# Patient Record
Sex: Male | Born: 1955
Health system: Southern US, Community
[De-identification: ages and names within clinical notes are randomized; demographics above are authoritative.]

## PROBLEM LIST (undated history)

## (undated) DIAGNOSIS — I2119 ST elevation (STEMI) myocardial infarction involving other coronary artery of inferior wall: Secondary | ICD-10-CM

## (undated) DIAGNOSIS — G4730 Sleep apnea, unspecified: Secondary | ICD-10-CM

## (undated) DIAGNOSIS — K219 Gastro-esophageal reflux disease without esophagitis: Secondary | ICD-10-CM

## (undated) DIAGNOSIS — E7800 Pure hypercholesterolemia, unspecified: Secondary | ICD-10-CM

## (undated) DIAGNOSIS — I2510 Atherosclerotic heart disease of native coronary artery without angina pectoris: Secondary | ICD-10-CM

## (undated) DIAGNOSIS — I10 Essential (primary) hypertension: Secondary | ICD-10-CM

## (undated) DIAGNOSIS — D649 Anemia, unspecified: Secondary | ICD-10-CM

## (undated) DIAGNOSIS — J302 Other seasonal allergic rhinitis: Secondary | ICD-10-CM

## (undated) DIAGNOSIS — M1990 Unspecified osteoarthritis, unspecified site: Secondary | ICD-10-CM

## (undated) DIAGNOSIS — T7840XA Allergy, unspecified, initial encounter: Secondary | ICD-10-CM

## (undated) HISTORY — DX: Atherosclerotic heart disease of native coronary artery without angina pectoris: I25.10

## (undated) HISTORY — PX: FRACTURE SURGERY: SHX138

## (undated) HISTORY — DX: Sleep apnea, unspecified: G47.30

## (undated) HISTORY — PX: APPENDECTOMY: SHX54

## (undated) HISTORY — PX: CHOLECYSTECTOMY OPEN: SUR202

## (undated) HISTORY — DX: Allergy, unspecified, initial encounter: T78.40XA

## (undated) HISTORY — PX: HERNIA REPAIR: SHX51

---

## 2002-04-05 ENCOUNTER — Encounter: Payer: Self-pay | Admitting: Family Medicine

## 2002-04-05 ENCOUNTER — Encounter: Admission: RE | Admit: 2002-04-05 | Discharge: 2002-04-05 | Payer: Self-pay | Admitting: Family Medicine

## 2006-01-13 ENCOUNTER — Emergency Department (HOSPITAL_COMMUNITY): Admission: EM | Admit: 2006-01-13 | Discharge: 2006-01-14 | Payer: Self-pay | Admitting: Emergency Medicine

## 2007-03-24 DIAGNOSIS — I2119 ST elevation (STEMI) myocardial infarction involving other coronary artery of inferior wall: Secondary | ICD-10-CM

## 2007-03-24 HISTORY — PX: CORONARY ANGIOPLASTY WITH STENT PLACEMENT: SHX49

## 2007-03-24 HISTORY — DX: ST elevation (STEMI) myocardial infarction involving other coronary artery of inferior wall: I21.19

## 2007-08-31 ENCOUNTER — Ambulatory Visit: Payer: Self-pay | Admitting: Cardiovascular Disease

## 2007-08-31 ENCOUNTER — Encounter: Payer: Self-pay | Admitting: Cardiovascular Disease

## 2007-08-31 ENCOUNTER — Inpatient Hospital Stay (HOSPITAL_COMMUNITY): Admission: EM | Admit: 2007-08-31 | Discharge: 2007-09-02 | Payer: Self-pay | Admitting: Emergency Medicine

## 2008-04-08 ENCOUNTER — Emergency Department (HOSPITAL_COMMUNITY): Admission: EM | Admit: 2008-04-08 | Discharge: 2008-04-09 | Payer: Self-pay | Admitting: Emergency Medicine

## 2009-01-02 ENCOUNTER — Encounter: Payer: Self-pay | Admitting: Pulmonary Disease

## 2009-01-02 ENCOUNTER — Ambulatory Visit (HOSPITAL_BASED_OUTPATIENT_CLINIC_OR_DEPARTMENT_OTHER): Admission: RE | Admit: 2009-01-02 | Discharge: 2009-01-02 | Payer: Self-pay | Admitting: Cardiovascular Disease

## 2009-01-12 ENCOUNTER — Ambulatory Visit: Payer: Self-pay | Admitting: Internal Medicine

## 2009-11-26 ENCOUNTER — Ambulatory Visit: Payer: Self-pay | Admitting: Cardiovascular Disease

## 2010-01-16 ENCOUNTER — Ambulatory Visit: Payer: Self-pay | Admitting: Cardiovascular Disease

## 2010-01-22 ENCOUNTER — Telehealth (INDEPENDENT_AMBULATORY_CARE_PROVIDER_SITE_OTHER): Payer: Self-pay | Admitting: Radiology

## 2010-01-23 ENCOUNTER — Encounter: Payer: Self-pay | Admitting: *Deleted

## 2010-01-23 ENCOUNTER — Ambulatory Visit: Payer: Self-pay | Admitting: Cardiology

## 2010-01-23 ENCOUNTER — Encounter (HOSPITAL_COMMUNITY)
Admission: RE | Admit: 2010-01-23 | Discharge: 2010-03-22 | Payer: Self-pay | Source: Home / Self Care | Attending: Cardiovascular Disease | Admitting: Cardiovascular Disease

## 2010-01-23 ENCOUNTER — Ambulatory Visit: Payer: Self-pay

## 2010-02-19 DIAGNOSIS — E785 Hyperlipidemia, unspecified: Secondary | ICD-10-CM | POA: Insufficient documentation

## 2010-02-19 DIAGNOSIS — G4733 Obstructive sleep apnea (adult) (pediatric): Secondary | ICD-10-CM | POA: Insufficient documentation

## 2010-02-19 DIAGNOSIS — I2510 Atherosclerotic heart disease of native coronary artery without angina pectoris: Secondary | ICD-10-CM | POA: Insufficient documentation

## 2010-02-20 ENCOUNTER — Ambulatory Visit: Payer: Self-pay | Admitting: Pulmonary Disease

## 2010-02-20 DIAGNOSIS — G2581 Restless legs syndrome: Secondary | ICD-10-CM | POA: Insufficient documentation

## 2010-03-24 ENCOUNTER — Inpatient Hospital Stay (HOSPITAL_COMMUNITY)
Admission: EM | Admit: 2010-03-24 | Discharge: 2010-03-25 | Payer: Self-pay | Source: Home / Self Care | Attending: Cardiovascular Disease | Admitting: Cardiovascular Disease

## 2010-03-26 ENCOUNTER — Telehealth (INDEPENDENT_AMBULATORY_CARE_PROVIDER_SITE_OTHER): Payer: Self-pay | Admitting: *Deleted

## 2010-04-13 ENCOUNTER — Encounter: Payer: Self-pay | Admitting: Emergency Medicine

## 2010-04-15 NOTE — Discharge Summary (Signed)
NAMELOGHAN, KURTZMAN               ACCOUNT NO.:  [id number]  MEDICAL RECORD NO.:  [id number]          PATIENT TYPE:  INP  LOCATION:  2002                         FACILITY:  [facility]  PHYSICIAN:  Cassell Clement, M.D. DATE OF BIRTH:  [birth year]  DATE OF ADMISSION:  03/24/2010 DATE OF DISCHARGE:  03/25/2010                              DISCHARGE SUMMARY   PRIMARY CARDIOLOGIST:  Vesta Mixer, MD  DISCHARGE DIAGNOSES: 1. Noncardiac left upper extremity discomfort.     a.     Treadmill stress Myoview March 25, 2010:  Excellent      exercise tolerance, no ischemic EKG changes, inferior wall infarct      at mid and basal levels with no ischemia, ejection fraction 65%. 2. Dyskinesia.     a.     Etiology unknown, top of differential diagnoses, recent      initiation on ropinirole for restless legs syndrome.  SECONDARY DIAGNOSES: 1. Coronary artery disease.     a.     Status post acute inferior wall myocardial infarction with      subsequent percutaneous coronary intervention with 2.75 x 28-mm      Promus and 3.0 x 15-mm Promus to proximal and mid right coronary      artery. 2. Restless legs syndrome. 3. Hypertension. 4. Dyslipidemia. 5. Obstructive sleep apnea. 6. Status post cholecystectomy. 7. Remote history of tobacco abuse (quit 2009).  ALLERGIES:  NKDA.  PROCEDURES: 1. EKG March 24, 2009:  Sinus rhythm, Q-wave in lead III, no other     significant ST-T-wave changes, 79 bpm. 2. Chest x-ray March 25, 2010:  No acute cardiopulmonary process     seen. 3. Treadmill Myoview March 25, 2010:  Please see discharge diagnoses     section #1 subsection A.  HISTORY OF PRESENT ILLNESS:  Mr. Lance is a 55 year old Caucasian gentleman with the above-noted complex medical history who was in his usual state of health until approximately 8 p.m. on March 24, 2010, when he took a pill with [facility] for his restless legs syndrome and then begin shaking severely.  He said he got  very cold and could not warm up.  He took shower because he thought his shaking could potentially be related to lower body temperature, but did not have significant improvement.  He then developed severe left upper extremity discomfort.  He said it was worse than symptoms he has had in the past. He denies any chest discomfort.  He subsequently came to the emergency department for further evaluation.  When seen by Cardiology, he was asymptomatic.  Of note, his prior angina is somewhat different than his current presentation including only minimal shakiness in the past as well as chest discomfort along with his left upper extremity pain.  HOSPITAL COURSE:  The patient was admitted and cardiac enzymes were cycled x3.  All of these were negative.  Due to his history of coronary artery disease and somewhat similar presentation to prior angina, he underwent a treadmill Myoview on the morning of March 25, 2010.  His exercise tolerance is noticed as being excellent.  The cardiologist who read his images  noted no concerning ST-T-wave changes on his EKG and inferior scar, but no ischemia as well as normal left ventricular ejection fraction.  The patient had no recurrent shakiness or left upper extremity during his hospital stay.  He was deemed stable for discharge, pending negative results of his Myoview by attending cardiologist, Dr. Cassell Clement who rounded on him on the morning of March 25, 2010. He has been instructed to not continue his ropinirole until discussing with the physician who prescribed it.  He will be set up for a followup appointment with his primary cardiologist in the next 2-4 weeks.  At the time of his discharge on the early evening of March 25, 2010, the patient was given his new medication list, new prescription for sublingual nitroglycerin (did not appear on his old med list), followup instructions, and all questions and concerns were addressed prior to his leaving  the hospital.  DISCHARGE LABORATORY DATA:  WBC 8.4, HGB 13.9, HCT 41.0, PLT count is 263, WBC differential was within normal limits.  Protime 12.2, INR 0.89. Sodium 139, potassium 4.1, chloride 103, BUN is 9, creatinine 1.2, glucose 111.  Point-of-care marker is negative and 3 full sets of enzymes negative.  Total cholesterol 163, triglycerides 227, HDL 31, LDL 87, total cholesterol/HDL ratio 5.3.  FOLLOWUP PLANS AND APPOINTMENTS:  Please see hospital course.  DISCHARGE MEDICATIONS: 1. Sublingual nitroglycerin 0.4 mg q.5 minutes up to 3 doses p.r.n.     for chest discomfort. 2. Enteric-coated aspirin 81 mg p.o. q.a.m. 3. Metoprolol tartrate 25 mg 1 tablet p.o. b.i.d. 4. Nexium 40 mg 1 capsule p.o. daily. 5. Plavix 75 mg 1 tablet p.o. daily. 6. Simvastatin 40 mg 1 tablet p.o. nightly.  DURATION OF DISCHARGE ENCOUNTER INCLUDING PHYSICIAN TIME:  Thirty five minutes.     Jarrett Ables, PAC   ______________________________ Cassell Clement, M.D.    MS/MEDQ  D:  03/25/2010  T:  03/26/2010  Job:  [id number]  cc:   Vesta Mixer, M.D.  Electronically Signed by Jarrett Ables PAC on 04/02/2010 10:54:44 AM Electronically Signed by Cassell Clement M.D. on 04/15/2010 11:51:03 AM

## 2010-04-22 NOTE — Progress Notes (Signed)
Summary: nuc pre-procedure  Phone Note Outgoing Call   Call placed by: Domenic Polite, CNMT,  January 22, 2010 3:53 PM Call placed to: Patient Reason for Call: Confirm/change Appt Summary of Call: Reviewed information on Myoview Information Sheet (see scanned document for further details).  Spoke with patient.  Initial call taken by: Domenic Polite, CNMT,  January 22, 2010 3:53 PM     Nuclear Med Background Indications for Stress Test: Evaluation for Ischemia, Stent Patency   History: Heart Catheterization, Myocardial Infarction, Myocardial Perfusion Study, Stents  History Comments: '09 MI / Cath /Stent RCA , 12/17/08 MPI Inf scar/ no isch. / EF =70% , OSA  Symptoms: Chest Pain  Symptoms Comments: Burning with lt arm pain   Nuclear Pre-Procedure Cardiac Risk Factors: Lipids Tech Comments: I - RBBB

## 2010-04-22 NOTE — Assessment & Plan Note (Signed)
Summary: Cardiology Nuclear Testing  Nuclear Med Background Indications for Stress Test: Evaluation for Ischemia, Stent Patency   History: Heart Catheterization, Myocardial Infarction, Myocardial Perfusion Study, Stents  History Comments: '09 MI>Stent-RCA; 9/10 [id number] scar, no ischemia, EF=70%  Symptoms: Chest Tightness, Palpitations, Rapid HR  Symptoms Comments: Last episode of CP:01/16/10.   Nuclear Pre-Procedure Cardiac Risk Factors: Family History - CAD, History of Smoking, Lipids, RBBB Caffeine/Decaff Intake: None NPO After: 7:00 PM Lungs: Clear IV 0.9% NS with Angio Cath: 20g     IV Site: R Antecubital IV Started by: Irean Hong, RN Chest Size (in) 42     Height (in): 69 Weight (lb): 200 BMI: 29.64 Tech Comments: Held metoprolol x 24 hours.  Nuclear Med Study 1 or 2 day study:  1 day     Stress Test Type:  Stress Reading MD:  Cassell Clement, MD     Referring MD:  Laurice Record, MD Resting Radionuclide:  Technetium 57m Tetrofosmin     Resting Radionuclide Dose:  10.0 mCi  Stress Radionuclide:  Technetium 8m Tetrofosmin     Stress Radionuclide Dose:  33.0 mCi   Stress Protocol Exercise Time (min):  11:30 min     Max HR:  148 bpm     Predicted Max HR:  166 bpm  Max Systolic BP: 200 mm Hg     Percent Max HR:  89.16 %     METS: 13.7 Rate Pressure Product:  98119    Stress Test Technologist:  [employer], CMA-N     Nuclear Technologist:  Domenic Polite, CNMT  Rest Procedure  Myocardial perfusion imaging was performed at rest 45 minutes following the intravenous administration of Technetium 48m Tetrofosmin.  Stress Procedure  The patient exercised for 11:30.  The patient stopped due to fatigue and denied any chest pain.  There were no significant ST-T wave changes, only occasional PVC's.  He did have a mild hypertensive response to exercise, 200/93.  Technetium 69m Tetrofosmin was injected at peak exercise and myocardial perfusion imaging was performed after a  brief delay.  QPS Raw Data Images:  Mild motion artefact Stress Images:  Normal homogeneous uptake in all areas of the myocardium. Rest Images:  Normal homogeneous uptake in all areas of the myocardium. Subtraction (SDS):  No evidence of ischemia. Transient Ischemic Dilatation:  1.23  (Normal <1.22)  Lung/Heart Ratio:  .37  (Normal <0.45)  Quantitative Gated Spect Images QGS EDV:  93 ml QGS ESV:  36 ml QGS EF:  61 % QGS cine images:  Normal LV systolic function.  Findings Normal nuclear study      Overall Impression  Exercise Capacity: Excellent exercise capacity. BP Response: Mild hypertensive blood pressure response. Clinical Symptoms: No chest pain ECG Impression: No significant ST segment change suggestive of ischemia.  Occasional PVCs Overall Impression: Normal stress nuclear study.  Appended Document: Cardiology Nuclear Testing copy sent to Dr. Melburn Popper

## 2010-04-24 NOTE — Assessment & Plan Note (Signed)
Summary: consult for osa, RLS   Visit Type:  Initial Consult Copy to:  Nahser Primary Talise Sligh/Referring Makenleigh Crownover:  none per pt  CC:  Sleep consult. pt states he wakes up in the middle of night and pt states his wife says he snores at night.  History of Present Illness: The pt is a 54y/o male who I have been asked to see for management of osa.  He was diagnosed last year with moderate obstructive and central apnea, with AHI of 20/hr.  He was also found to have large numbers of PLMS with 4/hr resulting in arousal or awakening.  He comes in today where he has continued to have loud snoring, but no one has mentioned an abnormal breathing pattern during sleep.  He goes to bed at 10pm, and arises at 5:30 am to start his day.  He is rested some mornings, but others not.  He does note an abnormal sensation in his legs at night that is made better with movement, and does have kicking according to his wife.  This has been an issue for him since his teenage years.  He denies issues with sleepiness while at work, but is very active.  He will doze at home with movies or tv.  He notes only rare sleepiness with driving.  His epworth score today is abnormal at 13, and he tells me his weight is up about 20 pounds over the last 2 years.  Preventive Screening-Counseling & Management  Alcohol-Tobacco     Smoking Status: quit  Current Medications (verified): 1)  Aspirin 81 Mg Tabs (Aspirin) .... Once Daily 2)  Plavix 75 Mg Tabs (Clopidogrel Bisulfate) .... Once Daily 3)  Metoprolol Tartrate 25 Mg Tabs (Metoprolol Tartrate) .... 1/2 Two Times A Day 4)  Simvastatin 40 Mg Tabs (Simvastatin) .... Once Daily 5)  Nexium 20 Mg Cpdr (Esomeprazole Magnesium) .... Once Daily As Needed 6)  Fish Oil 1200 Mg Caps (Omega-3 Fatty Acids) .... Once Daily  Allergies: 1)  ! Adhesive Tape 2)  Ace Inhibitors  Past History:  Past Medical History:  Hx of MYOCARDIAL INFARCTION (ICD-410.90) HYPERLIPIDEMIA (ICD-272.4) CAD  (ICD-414.00)--s/p PCI OBSTRUCTIVE SLEEP APNEA (ICD-327.23)--AHI 20/hr in 2010     Past Surgical History: stenting of right coronary artery Cholecystectomy  Family History: Reviewed history and no changes required. heart disease--mother prostate cancer--brother  Social History: Reviewed history and no changes required. married Patient states former smoker. quit 08/2007. 2 1/2 ppd. started age 58 occupation: Production designer, theatre/television/film at [employer] Status:  quit  Review of Systems       The patient complains of chest pain, headaches, and nasal congestion/difficulty breathing through nose.  The patient denies shortness of breath with activity, shortness of breath at rest, productive cough, non-productive cough, coughing up blood, irregular heartbeats, acid heartburn, indigestion, loss of appetite, weight change, abdominal pain, difficulty swallowing, sore throat, tooth/dental problems, sneezing, itching, ear ache, anxiety, depression, hand/feet swelling, joint stiffness or pain, rash, change in color of mucus, and fever.    Vital Signs:  Patient profile:   55 year old male Height:      69 inches Weight:      206.50 pounds BMI:     30.60 O2 Sat:      97 % on Room air Temp:     97.6 degrees F oral Pulse rate:   74 / minute BP sitting:   126 / 80  (right arm) Cuff size:   regular  Vitals Entered By: Carver Fila (February 20, 2010 2:13 PM)  O2 Flow:  Room air CC: Sleep consult. pt states he wakes up in the middle of night, pt states his wife says he snores at night Comments meds and allergies up dated Phone number updated Carver Fila  February 20, 2010 2:13 PM    Physical Exam  General:  ow male in nad  Eyes:  PERRLA and EOMI.   Nose:  deviated septum to left with significant narrowing Mouth:  mild elongation of soft palate, uvula normal Neck:  no jvd, tmg, LN Lungs:  clear to auscultation Heart:  rrr, 1/6 sem Abdomen:  soft and nontender, bs+ Extremities:  no  significant edema, no cyanosis pulses intact distally. Neurologic:  alert and oriented, moves all 4.   Impression & Recommendations:  Problem # 1:  OBSTRUCTIVE SLEEP APNEA (ICD-327.23) the pt does have moderate osa by his sleep study last year, but he is not sure this is significantly impacting his QOL enough to try cpap while working on weight loss.  I have had a long discussion with the pt about sleep apnea, and also discussed its impact on his CV health.  At this point, he would like to try weight loss alone, but is willing to go on cpap if he is not making progress in 6mos.    Problem # 2:  RESTLESS LEGS SYNDROME (ICD-333.94) the pt had large numbers of leg jerks on his sleep study, and his hx is very suggestive of RLS.  Will try him on a dopamine agonist, and see how he responds.  Medications Added to Medication List This Visit: 1)  Aspirin 81 Mg Tabs (Aspirin) .... Once daily 2)  Plavix 75 Mg Tabs (Clopidogrel bisulfate) .... Once daily 3)  Metoprolol Tartrate 25 Mg Tabs (Metoprolol tartrate) .... 1/2 two times a day 4)  Simvastatin 40 Mg Tabs (Simvastatin) .... Once daily 5)  Nexium 20 Mg Cpdr (Esomeprazole magnesium) .... Once daily as needed 6)  Fish Oil 1200 Mg Caps (Omega-3 fatty acids) .... Once daily 7)  Requip 0.5 Mg Tabs (Ropinirole hcl) .... As directed after dinner.  Other Orders: Consultation Level IV (60454)  Patient Instructions: 1)  will treat leg jerks with requip 0.5 mg one each night after dinner.  If not helping, or only partially helping, take 2 after dinner.  Please call me in 3-4 weeks with feedback. 2)  work on weight loss.  Will see you back in 6mos...if no signficant change, will consider cpap. 3)  followup with me in 6mos   Prescriptions: REQUIP 0.5 MG  TABS (ROPINIROLE HCL) as directed after dinner.  #60 x 0   Entered and Authorized by:   Barbaraann Share MD   Signed by:   Barbaraann Share MD on 02/20/2010   Method used:   Print then Give to Patient    RxID:   [id number]

## 2010-04-24 NOTE — Progress Notes (Signed)
Summary: bad reaction to requip---given Horizant samples to try  Phone Note Call from Patient Call back at Home Phone [phone]   Caller: Patient Call For: clacne Reason for Call: Talk to Nurse Summary of Call: Patient stating that he had a bad reaction to requip, he started shaking uncontrollably after taking med, he then went to the ER.  Patient asking for another med. Initial call taken by: Lehman Prom,  March 26, 2010 10:45 AM  Follow-up for Phone Call        Patient states he never took Requip on a regular basis because he had noticed his body would start shaking about 30 minutes after taking each time. Pt says he went to the ER on 03/24/2010 because of uncontrollable shaking. He has stopped using the Requip and wants to know if there is an alternative he can try. Pls advise. Follow-up by: Michel Bickers CMA,  March 26, 2010 12:15 PM  Additional Follow-up for Phone Call Additional follow up Details #1::        there is another med in that same family, but I am hesitant for him to try if he had such a reaction to the requip. can try horizant  600mg  one after dinner each night.  samples left in triage room.  let us know if helps Additional Follow-up by: Barbaraann Share MD,  March 26, 2010 4:58 PM    Additional Follow-up for Phone Call Additional follow up Details #2::    Patient is aware of new medication and was given directions on use. Samples left up front for patient to pick up on Thurs., 03/27/2010.Michel Bickers CMA  March 26, 2010 5:04 PM

## 2010-06-02 LAB — CARDIAC PANEL(CRET KIN+CKTOT+MB+TROPI)
CK, MB: 2.4 ng/mL (ref 0.3–4.0)
CK, MB: 2.7 ng/mL (ref 0.3–4.0)
Relative Index: INVALID (ref 0.0–2.5)
Relative Index: INVALID (ref 0.0–2.5)
Total CK: 55 U/L (ref 7–232)
Total CK: 61 U/L (ref 7–232)
Troponin I: 0.02 ng/mL (ref 0.00–0.06)
Troponin I: 0.03 ng/mL (ref 0.00–0.06)

## 2010-06-02 LAB — CK TOTAL AND CKMB (NOT AT ARMC)
CK, MB: 4.8 ng/mL — ABNORMAL HIGH (ref 0.3–4.0)
Relative Index: INVALID (ref 0.0–2.5)
Total CK: 89 U/L (ref 7–232)

## 2010-06-02 LAB — DIFFERENTIAL
Basophils Absolute: 0 10*3/uL (ref 0.0–0.1)
Basophils Relative: 0 % (ref 0–1)
Eosinophils Absolute: 0.2 10*3/uL (ref 0.0–0.7)
Eosinophils Relative: 2 % (ref 0–5)
Lymphocytes Relative: 26 % (ref 12–46)
Lymphs Abs: 2.2 10*3/uL (ref 0.7–4.0)
Monocytes Absolute: 0.8 10*3/uL (ref 0.1–1.0)
Monocytes Relative: 9 % (ref 3–12)
Neutro Abs: 5.3 10*3/uL (ref 1.7–7.7)
Neutrophils Relative %: 63 % (ref 43–77)

## 2010-06-02 LAB — CBC
HCT: 40.4 % (ref 39.0–52.0)
Hemoglobin: 13.5 g/dL (ref 13.0–17.0)
MCH: 29.3 pg (ref 26.0–34.0)
MCHC: 33.4 g/dL (ref 30.0–36.0)
MCV: 87.6 fL (ref 78.0–100.0)
Platelets: 263 10*3/uL (ref 150–400)
RBC: 4.61 MIL/uL (ref 4.22–5.81)
RDW: 12.3 % (ref 11.5–15.5)
WBC: 8.4 10*3/uL (ref 4.0–10.5)

## 2010-06-02 LAB — TROPONIN I: Troponin I: 0.02 ng/mL (ref 0.00–0.06)

## 2010-06-02 LAB — POCT I-STAT, CHEM 8
BUN: 9 mg/dL (ref 6–23)
Calcium, Ion: 1.15 mmol/L (ref 1.12–1.32)
Chloride: 103 mEq/L (ref 96–112)
Creatinine, Ser: 1.2 mg/dL (ref 0.4–1.5)
Glucose, Bld: 111 mg/dL — ABNORMAL HIGH (ref 70–99)
HCT: 41 % (ref 39.0–52.0)
Hemoglobin: 13.9 g/dL (ref 13.0–17.0)
Potassium: 4.1 mEq/L (ref 3.5–5.1)
Sodium: 139 mEq/L (ref 135–145)
TCO2: 28 mmol/L (ref 0–100)

## 2010-06-02 LAB — HEPARIN LEVEL (UNFRACTIONATED): Heparin Unfractionated: <0.1 IU/mL — ABNORMAL LOW (ref 0.30–0.70)

## 2010-06-02 LAB — POCT CARDIAC MARKERS
CKMB, poc: 2.2 ng/mL (ref 1.0–8.0)
Myoglobin, poc: 63.5 ng/mL (ref 12–200)
Troponin i, poc: <0.05 ng/mL (ref 0.00–0.09)

## 2010-06-02 LAB — LIPID PANEL
Cholesterol: 163 mg/dL (ref 0–200)
HDL: 31 mg/dL — ABNORMAL LOW (ref >39)
LDL Cholesterol: 87 mg/dL (ref 0–99)
Total CHOL/HDL Ratio: 5.3 RATIO
Triglycerides: 227 mg/dL — ABNORMAL HIGH (ref <150)
VLDL: 45 mg/dL — ABNORMAL HIGH (ref 0–40)

## 2010-06-02 LAB — PROTIME-INR
INR: 0.89 (ref 0.00–1.49)
Prothrombin Time: 12.2 seconds (ref 11.6–15.2)

## 2010-06-03 ENCOUNTER — Ambulatory Visit (INDEPENDENT_AMBULATORY_CARE_PROVIDER_SITE_OTHER): Payer: PRIVATE HEALTH INSURANCE | Admitting: Cardiovascular Disease

## 2010-06-03 DIAGNOSIS — G4730 Sleep apnea, unspecified: Secondary | ICD-10-CM

## 2010-06-03 DIAGNOSIS — E785 Hyperlipidemia, unspecified: Secondary | ICD-10-CM

## 2010-06-03 DIAGNOSIS — I2510 Atherosclerotic heart disease of native coronary artery without angina pectoris: Secondary | ICD-10-CM

## 2010-06-24 ENCOUNTER — Telehealth: Payer: Self-pay | Admitting: Cardiovascular Disease

## 2010-06-24 NOTE — Telephone Encounter (Signed)
Pt c/o right jaw pain last night and right neck pain today, jaw pain gone today. Denies SOB, nausea, CP. Pt was seen in urgent care today they felt it was allergy and muscle pain in nature not heart related. He was given nasal spray and muscle relaxant. Pt is at work and is updating Dr Ian Bushman. Pt was told he could take tylenol for any pain vs motrin due to asa and plavix use. Pt to go to er if any further complications or call tomorrow if he feels he needs to be seen in office, pt verbalized understanding. Pt will take muscle relaxant after work per pt. Michael Litter Ina Scrivens rn

## 2010-07-02 ENCOUNTER — Other Ambulatory Visit: Payer: Self-pay | Admitting: Cardiovascular Disease

## 2010-07-02 DIAGNOSIS — N529 Male erectile dysfunction, unspecified: Secondary | ICD-10-CM

## 2010-07-04 ENCOUNTER — Other Ambulatory Visit: Payer: Self-pay | Admitting: *Deleted

## 2010-07-04 DIAGNOSIS — N529 Male erectile dysfunction, unspecified: Secondary | ICD-10-CM

## 2010-07-04 MED ORDER — TADALAFIL 10 MG PO TABS: 10.0000 mg | ORAL_TABLET | Freq: Every day | ORAL | Status: DC | PRN

## 2010-07-04 NOTE — Telephone Encounter (Signed)
Fax received from pharmacy. Refill completed. Jodette Kenneisha Cochrane RN  

## 2010-07-04 NOTE — Telephone Encounter (Signed)
Fax received from pharmacy. Refill completed. Jodette Amara Manalang RN  

## 2010-07-07 LAB — BASIC METABOLIC PANEL
BUN: 10 mg/dL (ref 6–23)
CO2: 28 mEq/L (ref 19–32)
Calcium: 9.3 mg/dL (ref 8.4–10.5)
Chloride: 104 mEq/L (ref 96–112)
Creatinine, Ser: 0.87 mg/dL (ref 0.4–1.5)
GFR calc Af Amer: >60 mL/min (ref >60)
GFR calc non Af Amer: >60 mL/min (ref >60)
Glucose, Bld: 116 mg/dL — ABNORMAL HIGH (ref 70–99)
Potassium: 3.9 mEq/L (ref 3.5–5.1)
Sodium: 136 mEq/L (ref 135–145)

## 2010-07-07 LAB — TSH: TSH: 1.672 u[IU]/mL (ref 0.350–4.500)

## 2010-07-17 ENCOUNTER — Telehealth: Payer: Self-pay | Admitting: Cardiovascular Disease

## 2010-07-17 NOTE — Telephone Encounter (Signed)
This is a personal phone message for Dr.Nahser.  Dr. [name] asked Mr.Ashurst to call him when he found a truck for sale.

## 2010-08-05 NOTE — Discharge Summary (Signed)
Chris [name], Chris [name]               ACCOUNT NO.:  [id number]   MEDICAL RECORD NO.:  [id number]          PATIENT TYPE:  INP   LOCATION:  2006                         FACILITY:  [facility]   PHYSICIAN:  Vesta Mixer, M.D. DATE OF BIRTH:  17-May-1955   DATE OF ADMISSION:  08/31/2007  DATE OF DISCHARGE:  09/02/2007                               DISCHARGE SUMMARY   DISCHARGE DIAGNOSES:  1. Acute inferior wall myocardial infarction.  2. History of cigarette smoking.  3. Mild hypertension.  4. Presumed hypercholesterolemia.   DISCHARGE MEDICATIONS:  1. Plavix 75 mg a day.  2. Enteric-coated aspirin 81 mg a day.  3. Metoprolol 12.5 mg p.o. b.i.d.  4. Simvastatin 40 mg a day.  5. Lisinopril 5 mg a day.  6. Nitroglycerin 0.4 mg sublingually as needed.  7. Prilosec as needed.   The patient may return to work on September 12, 2007, doing light duty.  He  has been instructed not to lift more than 20 pounds for that first week.  He can return to normal activities on September 19, 2007.  He is to call Dr.  [name] for an appointment in 1-2 weeks.   HISTORY:  Chris [name] is a 55 year old gentleman who presented with an  acute inferior wall myocardial infarction.  Please see dictated H&P for  further details.   HOSPITAL COURSE BY PROBLEMS:  Inferior wall myocardial infarction.  The  patient was taken emergently to the cath lab where he was found to have  an occluded right coronary artery.  He underwent PTCA and stenting of  his right coronary artery.  We placed a 2.75 x 28-mm Promus stent in the  mid vessel.  It was deployed at 12 atmospheres.  A second 3-mm Promus  stent was placed in the proximal right coronary artery.  A 3.25 x 20-mm  Quantum monorail was used for post-stent dilatation.  It was inflated up  to 12 atmospheres in the distal/mid segment of the stent and then 16  atmospheres in the proximal aspect of the stent.  The patient's  arteriotomy was sealed using an Angio-Seal technique because  of his  restless legs.   The patient did quite well.  We will continue the same medications as  noted above and he will see Dr. [name] in several weeks.           ______________________________  Vesta Mixer, M.D.    PJN/MEDQ  D:  09/02/2007  T:  09/02/2007  Job:  [id number]

## 2010-08-05 NOTE — Cardiovascular Report (Signed)
[name], [name]               ACCOUNT NO.:  [id number]   MEDICAL RECORD NO.:  [id number]          PATIENT TYPE:  INP   LOCATION:  2910                         FACILITY:  [facility]   PHYSICIAN:  Vesta Mixer, M.D. DATE OF BIRTH:  08/30/1955   DATE OF PROCEDURE:  08/31/2007  DATE OF DISCHARGE:                            CARDIAC CATHETERIZATION   Chris [name] is a 55 year old gentleman with a history of uncontrolled  hypertension.  He presents with a two-day history of stuttering chest  pain.  The chest pain became steady today.  Was seen in the [facility]  Emergency Room and was found to have an ST-segment elevation myocardial  infarction involving the inferior wall.  He was transferred to [facility] for further evaluation.   The procedure was left heart catheterization with coronary angiography  with PTCA and stenting of the mid and proximal right coronary artery.   Right femoral artery was easily cannulated using a modified Seldinger  technique.  At the end of the case, angiography revealed a suitable  vessel for closure and Angio-Seal was used to close the vessel in a  standard manner.   HEMODYNAMICS:  LV pressure is 147/4.  The aortic pressure is 135/74.   Angiography left main.  The left main is fairly large.  It is ectatic.   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  There was diffuse 20-30% stenosis in the LAD but the vessel is ectatic.  The mid vessel has 30-40% diffuse stenosis.   The first diagonal artery is a very relatively large vessel and has  minor luminal irregularities.   The left circumflex artery is a large vessel.  There are diffuse 20-30%  stenosis.  The first obtuse marginal artery is a very large and  branching vessel.  There was diffuse 20% stenosis throughout the course  of this.   The right coronary artery is 100% occluded.   The left ventriculogram was performed in a 30 RAO position.  It reveals  hypokinesis of  the inferior base, but overall still well-preserved left  ventricular systolic function.  The ejection fraction is probably 55-  60%.   PCI.  The right coronary artery was engaged using a Judkins 6-French JR-  4 side-hole guide.  A Prowater wire was passed down to the distal right  coronary artery.  Angiomax was given and ACT was 412.  He had been  previously given Plavix 600 mg p.o. in the [facility] Emergency Room.   A 2.5 x 12-mm apex was passed down across the mid lesion.  It was  inflated up to 6 atmospheres for 15 seconds and then 6 atmospheres for  20 seconds.  When we achieved reperfusion, he started having some  bradycardia so 1 mg of atropine was given.  We also used balloon  inflations to keep the heart rate up.   It was then pulled proximally and inflated up to 6 atmospheres for 45  seconds and then another with 6 atmospheres for 45 seconds.  After this  slow reperfusion, the patient's heart rate stabilized and we were  able  to pull this balloon out and start with a stenting.   A 2.75 x 28-mm PROMUS stent was positioned in the mid vessel.  It was  deployed at 12 atmospheres for 25 seconds.  At this point, a 3.0 x 15-mm  PROMUS stent was placed in the proximal right coronary artery.  It was  deployed at 14 atmospheres for 25 seconds.   Poststent dilatation was achieved using a 3.25 x 20-mm Quantum Monorail.  It was positioned in the mid vessel and it was inflated up to 12  atmospheres for 20 seconds.  It was then pulled back to the proximal  vessel and was inflated up to 14 atmospheres for 20 seconds and then an  additional 16 atmospheres for 10 seconds.  This gave [place] a very nice  angiographic result.  He does have a small aneurysmal area in the  proximal/mid right coronary artery that appears not to be quite opposed  to the stent wall.  It would be impossible to actually oppose the stent  because of the large mismatch in size.  We will have to keep him on  Plavix and  hopefully this will not be a problem.  He is pain free and  stable leaving the cath lab.  He has a lot of trouble with restless legs  and so we have decided to use Angio-Seal closure device.           ______________________________  Vesta Mixer, M.D.     PJN/MEDQ  D:  08/31/2007  T:  08/31/2007  Job:  [id number]

## 2010-08-05 NOTE — H&P (Signed)
NAMEJAKYRON, [name]               ACCOUNT NO.:  [id number]   MEDICAL RECORD NO.:  [id number]          PATIENT TYPE:  INP   LOCATION:  2910                         FACILITY:  [facility]   PHYSICIAN:  Chris Faith, MD   DATE OF BIRTH:  1955-12-14   DATE OF ADMISSION:  08/31/2007  DATE OF DISCHARGE:                              HISTORY & PHYSICAL   CHIEF COMPLAINT:  Chest pressure.   HISTORY OF PRESENT ILLNESS:  This is a 55 year old white man who  presented to [facility] Emergency Department tonight with an inferior  ST elevation MI.  He is a smoker and has untreated hypertension.  He  first had chest pain which he thought was heartburn 2 or 3 days ago, he  ignored it and it resolved after about 3 hours.  Then, today after  working all day, his pain recurred at approximately 9:30 p.m. much more  severe 10/10 substernal pressure without radiation and associated with  significant nausea.  At [facility] Emergency Department, he was given  4000 units of heparin, 324 mg of aspirin, and 600 mg of Plavix, and was  transferred emergently to [facility] for catheterization.   PAST MEDICAL HISTORY:  1. GERD.  2. A ruptured gallbladder with peritonitis, status post surgical      treatment at [facility] several years ago.  3. Untreated hypertension.   SOCIAL HISTORY:  He lives in [place].  He is married.  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   FAMILY HISTORY:  Mother and father both still living with hypertension.  Mother has atrial fibrillation.   ALLERGIES:  None.   MEDICINES:  Prilosec.   PHYSICAL EXAMINATION:  VITAL SIGNS:  Blood pressure 165/108, pulse 72,  saturation 98% on 2 L nasal cannula, and respiratory rate 14.  GENERAL:  This is a pleasant man.  He has received morphine and Zofran  and is a little bit drowsy and is in no frank distress.  He appears  mildly uncomfortable.  Pupils are round and  reactive.  Sclerae are  clear.  Mucous membranes are moist.  No oral lesions.  NECK:  Supple.  Neck veins are flat.  No carotid bruits.  LUNGS:  Clear to auscultation bilaterally without wheezing or rales.  CARDIAC:  Normal rate.  Regular rhythm.  No murmurs or gallops.  Point  of maximal impulse is located in the normal position.  ABDOMEN:  Soft, nontender, and nondistended with no bruits.  There is a  well-healed gallbladder scar.  EXTREMITIES:  No edema, 2+ radial and dorsalis pedis pulses bilaterally.  SKIN:  There is a flat hypopigmented rash on the right groin.  MUSCULOSKELETAL:  No acute joint swelling or pain.  NEUROLOGIC:  Awake, alert, and oriented x3.  5/5 strength in all four  extremities.  No overt deficits noted.   DIAGNOSTIC TESTS:  Lab work is pending.  A 12-lead electrocardiogram  shows sinus rhythm rate of 66 beats per minute with hyperacute inferior  T-waves with ST elevation and  lateral reciprocal changes.   IMPRESSION:  A 55 year old white man with acute inferior myocardial  infarction.   PLAN:  1. He received 324 mg of aspirin, 600 mg of Plavix, and 4000 units of      IV heparin at [facility] and is now transferred to [facility] for      emergent catheterization by Dr. [name].  Anticoagulant in the cath      lab will be Angiomax.  2. Check fasting lipid panel and initiate statin.  3. Initiate beta-blocker and ACE inhibitor as blood pressure allows.  4. Routine post PCI in MI care including assessment of ejection      fraction with echo.  5. He will be educated on the importance of tobacco cessation.  6. We will consider changing his proton pump inhibitor to an H2      blocker.  7. Further plan, pending the results of catheterization.      Chris Faith, MD  Electronically Signed     NDL/MEDQ  D:  08/31/2007  T:  08/31/2007  Job:  [phone]

## 2010-08-14 ENCOUNTER — Encounter: Payer: Self-pay | Admitting: Pulmonary Disease

## 2010-08-19 ENCOUNTER — Encounter: Payer: Self-pay | Admitting: Gastroenterology

## 2010-08-25 ENCOUNTER — Ambulatory Visit: Payer: Self-pay | Admitting: Pulmonary Disease

## 2010-09-19 ENCOUNTER — Encounter: Payer: Self-pay | Admitting: Gastroenterology

## 2010-11-17 ENCOUNTER — Other Ambulatory Visit: Payer: Self-pay | Admitting: *Deleted

## 2010-11-17 MED ORDER — CLOPIDOGREL BISULFATE 75 MG PO TABS: 75.0000 mg | ORAL_TABLET | Freq: Every day | ORAL | Status: DC

## 2010-11-17 NOTE — Telephone Encounter (Signed)
Fax received from pharmacy. Refill completed. Jodette Finnleigh Marchetti RN  

## 2010-11-25 ENCOUNTER — Other Ambulatory Visit: Payer: Self-pay | Admitting: *Deleted

## 2010-11-25 MED ORDER — ESOMEPRAZOLE MAGNESIUM 40 MG PO CPDR: DELAYED_RELEASE_CAPSULE | ORAL | Status: DC

## 2010-11-25 NOTE — Telephone Encounter (Signed)
Pharmacy called and strength verified.

## 2010-12-04 ENCOUNTER — Ambulatory Visit (INDEPENDENT_AMBULATORY_CARE_PROVIDER_SITE_OTHER): Payer: PRIVATE HEALTH INSURANCE | Admitting: Cardiovascular Disease

## 2010-12-04 ENCOUNTER — Other Ambulatory Visit: Payer: Self-pay | Admitting: *Deleted

## 2010-12-04 ENCOUNTER — Encounter: Payer: Self-pay | Admitting: Cardiovascular Disease

## 2010-12-04 VITALS — BP 138/72 | HR 72 | Resp 16 | Wt 194.0 lb

## 2010-12-04 DIAGNOSIS — E785 Hyperlipidemia, unspecified: Secondary | ICD-10-CM

## 2010-12-04 DIAGNOSIS — I2510 Atherosclerotic heart disease of native coronary artery without angina pectoris: Secondary | ICD-10-CM

## 2010-12-04 LAB — LIPID PANEL
HDL: 32.6 mg/dL — ABNORMAL LOW (ref >39.00)
LDL Cholesterol: 92 mg/dL (ref 0–99)
Total CHOL/HDL Ratio: 5
Triglycerides: 136 mg/dL (ref 0.0–149.0)
VLDL: 27.2 mg/dL (ref 0.0–40.0)

## 2010-12-04 LAB — BASIC METABOLIC PANEL
CO2: 26 mEq/L (ref 19–32)
Chloride: 104 mEq/L (ref 96–112)
Creatinine, Ser: 0.8 mg/dL (ref 0.4–1.5)

## 2010-12-04 LAB — HEPATIC FUNCTION PANEL
Albumin: 4.2 g/dL (ref 3.5–5.2)
Alkaline Phosphatase: 67 U/L (ref 39–117)

## 2010-12-04 MED ORDER — SIMVASTATIN 40 MG PO TABS: 40.0000 mg | ORAL_TABLET | Freq: Every day | ORAL | Status: DC

## 2010-12-04 NOTE — Assessment & Plan Note (Signed)
Pt is doing well.  No angina.  Will continue  meds.

## 2010-12-04 NOTE — Assessment & Plan Note (Signed)
We'll check his lipid levels were at his next visit.

## 2010-12-04 NOTE — Progress Notes (Signed)
Chris [name] Date of Birth  [birth year] [facility] [address]     [address] [place], [place]  [address] [phone]  Fax  [phone]  History of Present Illness:  Chris [name] is a 55 year old gentleman. He has a history of coronary artery disease. He presented in 2009 with some episodes of chest pain and was found to have an inferior wall myocardial infarction. He underwent successful PTCA and stenting of his mid  right coronary artery using a 2.75 x 28 mm Promus stent. The stent was post dilated using a 3.0 noncompliant balloon.  We then positioned a 3.0 x 15 mm Promus stent in the proximal right coronary artery.  The stent was post dilated using a 3.25 mm noncompliant balloon.  Pt is doing well.  No angina or dyspnea.    Current Outpatient Prescriptions on File Prior to Visit  Medication Sig Dispense Refill  . aspirin 81 MG tablet Take 81 mg by mouth daily.        [name] CIALIS 10 MG tablet TAKE 1 TABLET AS NEEDED  20 tablet  3  . clopidogrel (PLAVIX) 75 MG tablet Take 1 tablet (75 mg total) by mouth daily.  30 tablet  5  . esomeprazole (NEXIUM) 40 MG capsule One tablet by mouth before breakfast daily  30 capsule  5  . metoprolol succinate (TOPROL-XL) 25 MG 24 hr tablet 1/2 two times a day       . Omega-3 Fatty Acids (FISH OIL) 1200 MG CAPS Take 1 capsule by mouth daily.        . simvastatin (ZOCOR) 40 MG tablet Take 40 mg by mouth at bedtime.          Allergies  Allergen Reactions  . Ace Inhibitors     REACTION: Rash  . Requip Rash    Past Medical History  Diagnosis Date  . Acute myocardial infarction, unspecified site, episode of care unspecified   . Other and unspecified hyperlipidemia   . Coronary atherosclerosis of unspecified type of vessel, native or graft     Past Surgical History  Procedure Date  . Cholecystectomy   . Coronary stent placement     History  Smoking status  . Former Smoker  . Quit date: 08/22/2007  Smokeless  tobacco  . Not on file    History  Alcohol Use: Not on file    Family History  Problem Relation Age of Onset  . Heart disease Mother   . Prostate cancer Brother     Reviw of Systems:  Reviewed in the HPI.  All other systems are negative.  Physical Exam: BP 138/72  Pulse 72  Resp 16  Wt 194 lb (87.998 kg) The patient is alert and oriented x 3.  The mood and affect are normal.   Skin: warm and dry.  Color is normal.    HEENT:   the sclera are nonicteric.  The mucous membranes are moist.  The carotids are 2+ without bruits.  There is no thyromegaly.  There is no JVD.    Lungs: clear.  The chest wall is non tender.    Heart: regular rate with a normal S1 and S2.  There are no murmurs, gallops, or rubs. The PMI is not displaced.     Abdomen: good bowel sounds.  There is no guarding or rebound.  There is no hepatosplenomegaly or tenderness.  There are no masses.   Extremities:  no clubbing, cyanosis, or edema.  The legs are without rashes.  The distal pulses are intact.   Neuro:  Cranial nerves II - XII are intact.  Motor and sensory functions are intact.    The gait is normal.   Assessment / Plan:

## 2010-12-04 NOTE — Telephone Encounter (Signed)
Fax received from pharmacy. Refill completed. Jodette Miyana Mordecai RN  

## 2010-12-05 ENCOUNTER — Other Ambulatory Visit: Payer: Self-pay | Admitting: *Deleted

## 2010-12-05 DIAGNOSIS — E785 Hyperlipidemia, unspecified: Secondary | ICD-10-CM

## 2010-12-05 MED ORDER — ATORVASTATIN CALCIUM 40 MG PO TABS: 40.0000 mg | ORAL_TABLET | Freq: Every day | ORAL | Status: DC

## 2010-12-05 NOTE — Telephone Encounter (Signed)
Patient called with lab results. Stop zocor, start lipitor, 3 mo labs set Pt verbalized understanding. Alfonso Ramus RN

## 2010-12-18 LAB — DIFFERENTIAL
Eosinophils Absolute: 0.3
Lymphs Abs: 5.4 — ABNORMAL HIGH
Monocytes Relative: 9
Neutrophils Relative %: 47

## 2010-12-18 LAB — BASIC METABOLIC PANEL
BUN: 10
CO2: 31
Calcium: 9.1
Chloride: 102
Creatinine, Ser: 0.94
GFR calc Af Amer: >60
GFR calc non Af Amer: >60
Glucose, Bld: 114 — ABNORMAL HIGH
Potassium: 4.3
Sodium: 140

## 2010-12-18 LAB — LIPID PANEL
HDL: 22 — ABNORMAL LOW
LDL Cholesterol: 110 — ABNORMAL HIGH
Total CHOL/HDL Ratio: 8.2
Triglycerides: 197 — ABNORMAL HIGH
Triglycerides: 259 — ABNORMAL HIGH
VLDL: 52 — ABNORMAL HIGH

## 2010-12-18 LAB — CBC
HCT: 43.6
HCT: 45.8
Hemoglobin: 15.5
Hemoglobin: 15.7
MCHC: 34.2
MCHC: 35.6
MCV: 88.4
MCV: 88.9
Platelets: 330
Platelets: 366
RBC: 4.94
RBC: 5.15
RDW: 12.5
RDW: 12.9
WBC: 12.2 — ABNORMAL HIGH
WBC: 13 — ABNORMAL HIGH

## 2010-12-18 LAB — POCT CARDIAC MARKERS: Myoglobin, poc: 37.5

## 2010-12-18 LAB — CARDIAC PANEL(CRET KIN+CKTOT+MB+TROPI)
Relative Index: 23.7 — ABNORMAL HIGH
Relative Index: 24.1 — ABNORMAL HIGH
Troponin I: 8.69

## 2010-12-18 LAB — APTT: aPTT: 63 — ABNORMAL HIGH

## 2010-12-18 LAB — PROTIME-INR: INR: 1.1

## 2010-12-18 LAB — TSH: TSH: 1.169

## 2011-02-17 ENCOUNTER — Other Ambulatory Visit: Payer: Self-pay | Admitting: Cardiovascular Disease

## 2011-03-11 ENCOUNTER — Ambulatory Visit: Payer: Worker's Compensation

## 2011-03-12 ENCOUNTER — Other Ambulatory Visit: Payer: PRIVATE HEALTH INSURANCE | Admitting: *Deleted

## 2011-03-13 ENCOUNTER — Other Ambulatory Visit (INDEPENDENT_AMBULATORY_CARE_PROVIDER_SITE_OTHER): Payer: PRIVATE HEALTH INSURANCE | Admitting: *Deleted

## 2011-03-13 DIAGNOSIS — E785 Hyperlipidemia, unspecified: Secondary | ICD-10-CM

## 2011-03-13 LAB — LIPID PANEL
Cholesterol: 133 mg/dL (ref 0–200)
LDL Cholesterol: 66 mg/dL (ref 0–99)
Total CHOL/HDL Ratio: 4

## 2011-03-13 LAB — HEPATIC FUNCTION PANEL
Alkaline Phosphatase: 72 U/L (ref 39–117)
Bilirubin, Direct: 0.1 mg/dL (ref 0.0–0.3)
Total Bilirubin: 0.7 mg/dL (ref 0.3–1.2)
Total Protein: 7.3 g/dL (ref 6.0–8.3)

## 2011-03-13 LAB — BASIC METABOLIC PANEL
BUN: 14 mg/dL (ref 6–23)
CO2: 27 mEq/L (ref 19–32)
Chloride: 104 mEq/L (ref 96–112)
Creatinine, Ser: 1 mg/dL (ref 0.4–1.5)
Glucose, Bld: 109 mg/dL — ABNORMAL HIGH (ref 70–99)
Potassium: 4.3 mEq/L (ref 3.5–5.1)

## 2011-04-07 ENCOUNTER — Other Ambulatory Visit: Payer: Self-pay | Admitting: *Deleted

## 2011-04-07 MED ORDER — METOPROLOL SUCCINATE ER 25 MG PO TB24: 12.5000 mg | ORAL_TABLET | Freq: Two times a day (BID) | ORAL | Status: DC

## 2011-04-07 NOTE — Telephone Encounter (Signed)
Opened in Error.

## 2011-04-07 NOTE — Telephone Encounter (Signed)
Fax Received. Refill Completed. Chris [name] (R.M.A)   

## 2011-04-09 ENCOUNTER — Other Ambulatory Visit: Payer: Self-pay | Admitting: *Deleted

## 2011-04-09 NOTE — Telephone Encounter (Signed)
Opened in Error.

## 2011-05-19 ENCOUNTER — Other Ambulatory Visit: Payer: Self-pay | Admitting: *Deleted

## 2011-05-19 MED ORDER — CLOPIDOGREL BISULFATE 75 MG PO TABS: 75.0000 mg | ORAL_TABLET | Freq: Every day | ORAL | Status: DC

## 2011-06-03 ENCOUNTER — Other Ambulatory Visit: Payer: Self-pay | Admitting: *Deleted

## 2011-06-03 MED ORDER — ATORVASTATIN CALCIUM 40 MG PO TABS: 40.0000 mg | ORAL_TABLET | Freq: Every day | ORAL | Status: DC

## 2011-06-03 NOTE — Telephone Encounter (Signed)
Fax Received. Refill Completed. Chris [name] (R.M.A). Pt will get more refills after Appointment. 

## 2011-06-09 ENCOUNTER — Ambulatory Visit (INDEPENDENT_AMBULATORY_CARE_PROVIDER_SITE_OTHER): Payer: PRIVATE HEALTH INSURANCE | Admitting: Cardiovascular Disease

## 2011-06-09 ENCOUNTER — Other Ambulatory Visit (INDEPENDENT_AMBULATORY_CARE_PROVIDER_SITE_OTHER): Payer: PRIVATE HEALTH INSURANCE

## 2011-06-09 ENCOUNTER — Encounter: Payer: Self-pay | Admitting: Cardiovascular Disease

## 2011-06-09 VITALS — BP 148/97 | HR 66 | Ht 69.0 in | Wt 200.8 lb

## 2011-06-09 DIAGNOSIS — E785 Hyperlipidemia, unspecified: Secondary | ICD-10-CM

## 2011-06-09 DIAGNOSIS — G4733 Obstructive sleep apnea (adult) (pediatric): Secondary | ICD-10-CM

## 2011-06-09 DIAGNOSIS — I2510 Atherosclerotic heart disease of native coronary artery without angina pectoris: Secondary | ICD-10-CM

## 2011-06-09 LAB — BASIC METABOLIC PANEL
BUN: 13 mg/dL (ref 6–23)
CO2: 27 mEq/L (ref 19–32)
Calcium: 9.1 mg/dL (ref 8.4–10.5)
Creatinine, Ser: 0.9 mg/dL (ref 0.4–1.5)
GFR: 95.3 mL/min (ref >60.00)
Glucose, Bld: 113 mg/dL — ABNORMAL HIGH (ref 70–99)

## 2011-06-09 LAB — HEPATIC FUNCTION PANEL
ALT: 28 U/L (ref 0–53)
AST: 23 U/L (ref 0–37)
Bilirubin, Direct: 0 mg/dL (ref 0.0–0.3)
Total Bilirubin: 0.6 mg/dL (ref 0.3–1.2)

## 2011-06-09 LAB — LIPID PANEL: Total CHOL/HDL Ratio: 4

## 2011-06-09 NOTE — Assessment & Plan Note (Signed)
Have advised him to check in with his pulmonologist to see if he needs to wear a sleep mask.

## 2011-06-09 NOTE — Patient Instructions (Signed)
Your physician recommends that you return for a FASTING lipid profile: TODAY AND IN 6 MONTHS  CONTACT PULMONOLOGIST FOR CPAP MASK ASAP PLEASE.  Your physician wants you to follow-up in: 6 MONTHS You will receive a reminder letter in the mail two months in advance. If you don't receive a letter, please call our office to schedule the follow-up appointment.  Your physician recommends that you continue on your current medications as directed. Please refer to the Current Medication list given to you today.

## 2011-06-09 NOTE — Assessment & Plan Note (Signed)
Is doing well from a cardiac standpoint. He's not had any episodes of chest pain. We will check fasting labs on him today and again in 6 months we'll see him again.

## 2011-06-09 NOTE — Progress Notes (Signed)
Chris [name] Date of Birth  1955-08-16 [facility] [address]     [address] [place], [place]  [address] [phone]  Fax  [phone]  Problems: 1. Coronary artery disease- He presented in 2009 with some episodes of chest pain and was found to have an inferior wall myocardial infarction. He underwent successful PTCA and stenting of his mid  right coronary artery using a 2.75 x 28 mm Promus stent. The stent was post dilated using a 3.0 noncompliant balloon.  We then positioned a 3.0 x 15 mm Promus stent in the proximal right coronary artery.  The stent was post dilated using a 3.25 mm noncompliant balloon. 2. Hyperlipidemia 3. Sleep apnea 4. Hypertension  History of Present Illness:  Chris [name] is a 56 year old gentleman. He has a history of coronary artery disease. He presented in 2009 with some episodes of chest pain and was found to have an inferior wall myocardial infarction. He underwent successful PTCA and stenting of his mid  right coronary artery using a 2.75 x 28 mm Promus stent. The stent was post dilated using a 3.0 noncompliant balloon.  We then positioned a 3.0 x 15 mm Promus stent in the proximal right coronary artery.  The stent was post dilated using a 3.25 mm noncompliant balloon.  Pt is doing well.  No angina or dyspnea.    He has not been sleeping well.  He also did not take his meds this am.  Current Outpatient Prescriptions on File Prior to Visit  Medication Sig Dispense Refill  . aspirin 81 MG tablet Take 81 mg by mouth daily.        [name] atorvastatin (LIPITOR) 40 MG tablet Take 1 tablet (40 mg total) by mouth daily.  30 tablet  0  . CIALIS 10 MG tablet TAKE 1 TABLET AS NEEDED  20 tablet  3  . clopidogrel (PLAVIX) 75 MG tablet Take 1 tablet (75 mg total) by mouth daily.  30 tablet  5  . esomeprazole (NEXIUM) 40 MG capsule One tablet by mouth before breakfast daily  30 capsule  5  . metoprolol succinate (TOPROL-XL) 25 MG 24 hr  tablet Take 0.5 tablets (12.5 mg total) by mouth 2 (two) times daily.  60 tablet  5  . Omega-3 Fatty Acids (FISH OIL) 1200 MG CAPS Take 1 capsule by mouth daily.          Allergies  Allergen Reactions  . Ace Inhibitors     REACTION: Rash  . Requip Rash    Past Medical History  Diagnosis Date  . Acute myocardial infarction, unspecified site, episode of care unspecified   . Other and unspecified hyperlipidemia   . Coronary atherosclerosis of unspecified type of vessel, native or graft     Past Surgical History  Procedure Date  . Cholecystectomy   . Coronary stent placement     History  Smoking status  . Former Smoker  . Quit date: 08/22/2007  Smokeless tobacco  . Not on file    History  Alcohol Use: Not on file    Family History  Problem Relation Age of Onset  . Heart disease Mother   . Prostate cancer Brother     Reviw of Systems:  Reviewed in the HPI.  All other systems are negative.  Physical Exam: BP 148/97  Pulse 66  Ht 5\' 9"  (1.753 m)  Wt 200 lb 12.8 oz (91.082 kg)  BMI 29.65 kg/m2 The patient is alert and  oriented x 3.  The mood and affect are normal.   Skin: warm and dry.  Color is normal.    HEENT:   the sclera are nonicteric.  The mucous membranes are moist.  The carotids are 2+ without bruits.  There is no thyromegaly.  There is no JVD.    Lungs: clear.  The chest wall is non tender.    Heart: regular rate with a normal S1 and S2.  There are no murmurs, gallops, or rubs. The PMI is not displaced.     Abdomen: good bowel sounds.  There is no guarding or rebound.  There is no hepatosplenomegaly or tenderness.  There are no masses.   Extremities:  no clubbing, cyanosis, or edema.  The legs are without rashes.  The distal pulses are intact.   Neuro:  Cranial nerves II - XII are intact.  Motor and sensory functions are intact.    The gait is normal.  EKG: Normal sinus rhythm. Occasional premature ventricular contractions. Old inferior wall  myocardial infarction. There are no changes from his previous tracing. Assessment / Plan:

## 2011-07-08 ENCOUNTER — Other Ambulatory Visit: Payer: Self-pay | Admitting: Cardiovascular Disease

## 2011-07-08 MED ORDER — CLOPIDOGREL BISULFATE 75 MG PO TABS: 75.0000 mg | ORAL_TABLET | Freq: Every day | ORAL | Status: DC

## 2011-07-08 MED ORDER — ATORVASTATIN CALCIUM 40 MG PO TABS: 40.0000 mg | ORAL_TABLET | Freq: Every day | ORAL | Status: DC

## 2011-08-14 ENCOUNTER — Encounter: Payer: Self-pay | Admitting: *Deleted

## 2011-09-10 ENCOUNTER — Other Ambulatory Visit: Payer: Self-pay | Admitting: *Deleted

## 2011-09-10 DIAGNOSIS — N529 Male erectile dysfunction, unspecified: Secondary | ICD-10-CM

## 2011-09-10 MED ORDER — TADALAFIL 10 MG PO TABS: 10.0000 mg | ORAL_TABLET | Freq: Every day | ORAL | Status: DC | PRN

## 2011-09-10 NOTE — Telephone Encounter (Signed)
Fax received from pharmacy. Refill completed. Jodette Symphony Demuro RN  

## 2011-11-24 ENCOUNTER — Other Ambulatory Visit: Payer: Self-pay | Admitting: Cardiovascular Disease

## 2011-11-24 NOTE — Telephone Encounter (Signed)
Fax Received. Refill Completed. Chris [name] (R.M.A)   

## 2012-01-15 ENCOUNTER — Other Ambulatory Visit: Payer: Self-pay | Admitting: Cardiovascular Disease

## 2012-01-15 MED ORDER — METOPROLOL SUCCINATE ER 25 MG PO TB24: 12.5000 mg | ORAL_TABLET | Freq: Two times a day (BID) | ORAL | Status: DC

## 2012-01-15 NOTE — Telephone Encounter (Signed)
lmom/ctuck

## 2012-01-15 NOTE — Telephone Encounter (Signed)
RX sent into pharmacy

## 2012-01-15 NOTE — Telephone Encounter (Signed)
PT CALLING FOE REFILLS OF METOPROLOL 25 MG, PT OUT AND GOING OUT OF TOWN THIS PM, NEEDS CALLED IN ASAP, DIDN'T REALIZE HE WAS OUT OF REFILLS AT THE PHARMACY, USES [facility]

## 2012-02-08 ENCOUNTER — Encounter: Payer: Self-pay | Admitting: Physician Assistant

## 2012-02-08 ENCOUNTER — Ambulatory Visit (INDEPENDENT_AMBULATORY_CARE_PROVIDER_SITE_OTHER): Payer: PRIVATE HEALTH INSURANCE | Admitting: Physician Assistant

## 2012-02-08 VITALS — BP 145/94 | HR 60 | Temp 98.3°F | Resp 16 | Ht 69.0 in | Wt 199.4 lb

## 2012-02-08 DIAGNOSIS — R51 Headache: Secondary | ICD-10-CM

## 2012-02-08 LAB — POCT CBC
Hemoglobin: 15.2 g/dL (ref 14.1–18.1)
Lymph, poc: 3 (ref 0.6–3.4)
MCH, POC: 28.6 pg (ref 27–31.2)
MCHC: 30.8 g/dL — AB (ref 31.8–35.4)
MID (cbc): 0.5 (ref 0–0.9)
MPV: 8.1 fL (ref 0–99.8)
POC Granulocyte: 4.7 (ref 2–6.9)
POC MID %: 5.6 %M (ref 0–12)
Platelet Count, POC: 416 10*3/uL (ref 142–424)
RBC: 5.31 M/uL (ref 4.69–6.13)
WBC: 8.2 10*3/uL (ref 4.6–10.2)

## 2012-02-08 LAB — POCT SEDIMENTATION RATE: POCT SED RATE: 10 mm/hr (ref 0–22)

## 2012-02-08 MED ORDER — IPRATROPIUM BROMIDE 0.03 % NA SOLN: 2.0000 | Freq: Two times a day (BID) | NASAL | Status: DC

## 2012-02-08 NOTE — Progress Notes (Signed)
Subjective:    Patient ID: Chris [name], male    DOB: 1955/07/07, 56 y.o.   MRN: [id number]  HPI  Chris [name] is a 56 yr old male here with 4-5 days of "mucus in my head".  The patient states he felt a headache coming on 5 days ago.  The headache has persisted since then.  He has used saline nasal spray and Mucinex-D for symptoms.  He denies runny nose, nasal drainage, sore throat, post-nasal drainage, sinus pressure, tooth pain, or coughing.  He does endorse some right ear pain.  His only symptom seems to be headache.  The headache is right sided.  He states the pain is around/behind his right eye.  He points to the right temple when asked to localize the pain.  States there is some pain when looking up and laterally with his right eye.  He denies photophobia or phonophobia, nausea, vomiting.  No specific visual changes, though he says he feels like he may need glasses.  He denies jaw pain or claudication.  He does not usually get headaches.     Review of Systems  Constitutional: Negative for fever and chills.  HENT: Positive for ear pain, congestion and sinus pressure. Negative for sore throat, rhinorrhea, sneezing and postnasal drip.   Respiratory: Negative for cough, shortness of breath and wheezing.   Cardiovascular: Negative.   Gastrointestinal: Negative.   Musculoskeletal: Negative.   Skin: Negative.   Neurological: Positive for syncope, light-headedness and headaches. Negative for dizziness.       Objective:   Physical Exam  Vitals reviewed. Constitutional: He is oriented to person, place, and time. He appears well-developed and well-nourished. No distress.  HENT:  Head: Normocephalic and atraumatic.  Right Ear: Tympanic membrane and ear canal normal.  Left Ear: Tympanic membrane and ear canal normal.  Nose: Nose normal. Right sinus exhibits no maxillary sinus tenderness and no frontal sinus tenderness. Left sinus exhibits no maxillary sinus tenderness and no frontal sinus  tenderness.  Mouth/Throat: Uvula is midline, oropharynx is clear and moist and mucous membranes are normal.       No TTP over right temple  Eyes: EOM are normal. Pupils are equal, round, and reactive to light. Right eye exhibits no discharge and no hordeolum. No foreign body present in the right eye. Left eye exhibits no discharge and no hordeolum. No foreign body present in the left eye. Right conjunctiva is not injected. Left conjunctiva is not injected. No scleral icterus.  Fundoscopic exam:      The right eye shows no AV nicking and no papilledema.       The left eye shows no AV nicking and no papilledema.  Neck: Neck supple. No thyromegaly present.  Cardiovascular: Normal rate, regular rhythm and normal heart sounds.   Pulmonary/Chest: Effort normal and breath sounds normal. He has no wheezes. He has no rales.  Lymphadenopathy:    He has no cervical adenopathy.  Neurological: He is alert and oriented to person, place, and time. No cranial nerve deficit.  Skin: Skin is warm and dry.  Psychiatric: He has a normal mood and affect. His behavior is normal.     Filed Vitals:   02/08/12 1102  BP: 145/94  Pulse: 60  Temp: 98.3 F (36.8 C)  Resp: 16       Results for orders placed in visit on 02/08/12  POCT CBC      Component Value Range   WBC 8.2  4.6 - 10.2 K/uL  Lymph, poc 3.0  0.6 - 3.4   POC LYMPH PERCENT 36.5  10 - 50 %L   MID (cbc) 0.5  0 - 0.9   POC MID % 5.6  0 - 12 %M   POC Granulocyte 4.7  2 - 6.9   Granulocyte percent 57.9  37 - 80 %G   RBC 5.31  4.69 - 6.13 M/uL   Hemoglobin 15.2  14.1 - 18.1 g/dL   HCT, POC 57.8  46.9 - 53.7 %   MCV 92.9  80 - 97 fL   MCH, POC 28.6  27 - 31.2 pg   MCHC 30.8 (*) 31.8 - 35.4 g/dL   RDW, POC 62.9     Platelet Count, POC 416  142 - 424 K/uL   MPV 8.1  0 - 99.8 fL  POCT SEDIMENTATION RATE      Component Value Range   POCT SED RATE 10  0 - 22 mm/hr        Assessment & Plan:   1. Headache  POCT CBC, POCT SEDIMENTATION  RATE, ipratropium (ATROVENT) 0.03 % nasal spray    Chris [name] is a 56 yr old male her with 4 days of headache located around the right eye/right temple.  Exam is normal.  CBC and ESR are both normal.  Doubt that this is sinusitis given his lack of URI symptoms or sinus tenderness, though I'm not sure what the etiology is.  Will start a trial of Atrovent nasal spray.  He may continue plain Mucinex.  Cautioned him against using decongestants, especially given his cardiac history.  Encouraged Tylenol for headache relief as he has not tried this yet.  He will RTC if worsening or not improving, or if new symptoms develop.

## 2012-02-08 NOTE — Patient Instructions (Addendum)
Use the Atrovent nasal spray twice daily for relief of nasal congestion.  You may continue using Mucinex (not the Mucinex-D though as that can make your blood pressure go up).  Use Tylenol for headache.  I will let you know when I have the result of your sedimentation rate (which should be back in about an hour).  If you are worsening or if your headache does not go away, come back in.

## 2012-02-23 ENCOUNTER — Other Ambulatory Visit: Payer: Self-pay | Admitting: *Deleted

## 2012-02-23 MED ORDER — ESOMEPRAZOLE MAGNESIUM 40 MG PO CPDR: DELAYED_RELEASE_CAPSULE | ORAL | Status: DC

## 2012-02-23 NOTE — Telephone Encounter (Signed)
Fax Received. Refill Completed. Chris [name] (R.M.A)   

## 2012-03-14 ENCOUNTER — Ambulatory Visit (INDEPENDENT_AMBULATORY_CARE_PROVIDER_SITE_OTHER): Payer: PRIVATE HEALTH INSURANCE | Admitting: Family Medicine

## 2012-03-14 ENCOUNTER — Telehealth: Payer: Self-pay | Admitting: Family Medicine

## 2012-03-14 ENCOUNTER — Ambulatory Visit: Payer: PRIVATE HEALTH INSURANCE

## 2012-03-14 VITALS — BP 131/75 | HR 71 | Temp 98.6°F | Resp 16 | Ht 69.0 in | Wt 205.0 lb

## 2012-03-14 DIAGNOSIS — R079 Chest pain, unspecified: Secondary | ICD-10-CM

## 2012-03-14 MED ORDER — GI COCKTAIL ~~LOC~~
30.0000 mL | Freq: Once | ORAL | Status: AC
  Administered 2012-03-14: 30 mL via ORAL

## 2012-03-14 NOTE — Progress Notes (Signed)
[facility] [address], [place] [address] [phone]  Date:  03/14/2012   Name:  Chris [name]   DOB:  10/27/1955   MRN:  [id number]  PCP:  Shade Flood, MD    Chief Complaint: Chest Pain   History of Present Illness:  Chris [name] is a 56 y.o. very pleasant male patient who presents with the following:  Here today with CP.  History of CAD- he had an MI in 2009 and had a PTCA.  He is not sure of the date of his last stress test although he does follow- up regularly with cardiology.  Per chart he had a negative myoview on 03/25/10 which showed no ischemic changes and a stable inferior wall infarct.   He has noted CP off and on for 2 or 3 days.   He also has noted some loose stools for a few days so he thought his stomach might be upset. He feels as though he has not been able to have a good BM and is just passing a little at a time. He has been passing a lot of gas as well.  No burping or definite heartburn.   This am he awoke and his chest felt tight. He called Dr. Sallee Provencal office who suggested that he be evaluated.  He had the same feeling yesterday but it went away as the day went on.    The episodes of pain seem to come and go,but he is not sure how long they last. The symptoms seem better when he is active.   He does not note any SOB.  No sweating, jaw pain or arm pain.  He does exercise regularly at home on a treadmill. He last did this about a week ago- no CP with exercise. He will run/ walk for up to an hour without any problems.    He did try a natural remedy for bowel regularity just today.  He has been off of his nexium for a few weeks.  He has had a colonoscopy in the past- done about 5 years ago.  He was told that it looked ok.     Patient Active Problem List  Diagnosis  . HYPERLIPIDEMIA  . OBSTRUCTIVE SLEEP APNEA  . MYOCARDIAL INFARCTION  . CAD  . RESTLESS LEGS SYNDROME    Past Medical History  Diagnosis Date  . Acute  myocardial infarction, unspecified site, episode of care unspecified   . Other and unspecified hyperlipidemia   . Coronary atherosclerosis of unspecified type of vessel, native or graft   . Sleep apnea     Past Surgical History  Procedure Date  . Cholecystectomy   . Coronary stent placement     History  Substance Use Topics  . Smoking status: Former Smoker    Quit date: 08/22/2007  . Smokeless tobacco: Not on file  . Alcohol Use: No    Family History  Problem Relation Age of Onset  . Heart disease Mother   . Prostate cancer Brother     Allergies  Allergen Reactions  . Ace Inhibitors     REACTION: Rash  . Ropinirole Hcl Rash    Medication list has been reviewed and updated.  Current Outpatient Prescriptions on File Prior to Visit  Medication Sig Dispense Refill  . aspirin 81 MG tablet Take 81 mg by mouth daily.        [name] atorvastatin (LIPITOR) 40 MG tablet Take 1 tablet (40 mg total) by mouth  daily.  30 tablet  11  . clopidogrel (PLAVIX) 75 MG tablet TAKE 1 TABLET (75 MG TOTAL) BY MOUTH DAILY.  30 tablet  5  . esomeprazole (NEXIUM) 40 MG capsule One tablet by mouth before breakfast daily  30 capsule  4  . ipratropium (ATROVENT) 0.03 % nasal spray Place 2 sprays into the nose 2 (two) times daily.  30 mL  1  . metoprolol succinate (TOPROL-XL) 25 MG 24 hr tablet Take 0.5 tablets (12.5 mg total) by mouth 2 (two) times daily.  30 tablet  2  . Omega-3 Fatty Acids (FISH OIL) 1200 MG CAPS Take 1 capsule by mouth daily.        . tadalafil (CIALIS) 10 MG tablet Take 1 tablet (10 mg total) by mouth daily as needed for erectile dysfunction.  20 tablet  1    Review of Systems:  As per HPI- otherwise negative.   Physical Examination: Filed Vitals:   03/14/12 1031  BP: 131/75  Pulse: 71  Temp: 98.6 F (37 C)  Resp: 16   Filed Vitals:   03/14/12 1031  Height: 5\' 9"  (1.753 m)  Weight: 205 lb (92.987 kg)   Body mass index is 30.27 kg/(m^2). Ideal Body Weight: Weight in  (lb) to have BMI = 25: 168.9   GEN: WDWN, NAD, Non-toxic, A & O x 3, looks well.   HEENT: Atraumatic, Normocephalic. Neck supple. No masses, No LAD. Bilateral TM wnl, oropharynx normal.  PEERL,EOMI.   Ears and Nose: No external deformity. CV: RRR, No M/G/R. No JVD. No thrill. No extra heart sounds. PULM: CTA B, no wheezes, crackles, rhonchi. No retractions. No resp. distress. No accessory muscle use. ABD: S, ND.  He has tenderness over the epigastrium, and his anterior chest wall also is a bit tender to pressure.  EXTR: No c/c/e NEURO Normal gait.  PSYCH: Normally interactive. Conversant. Not depressed or anxious appearing.  Calm demeanor.   EKG: evidence of old MI and incomplete RBBB, no no sign of acute ischemia or ST elevation  GI cocktail: did not change symptoms at first. However, he then belched several times and felt relief  [facility] reading (PRIMARY) by  Dr. Patsy Lager. Abdominal series: normal  ACUTE ABDOMEN SERIES (ABDOMEN 2 VIEW & CHEST 1 VIEW)  Comparison: None.  Findings: No active infiltrate or effusion is seen. Mediastinal  contours appear normal. The heart is within normal limits in size.  No bony abnormality is seen.  Supine and erect views of the abdomen show no bowel obstruction.  No free air is seen. No opaque calculi are noted.  IMPRESSION:  1. No active lung disease.  2. No bowel obstruction. No free air.  Called and spoke with Dr. [place] at [facility].  Given his very atypical symptoms and his reassuring EKG today we plan to have him follow- up as an outpatient unless something changes.  It does not seem that he is having acute cardiac problems or angina.   Assessment and Plan: 1. Chest pain  EKG 12-Lead, DG Abd Acute W/Chest, gi cocktail (Maalox,Lidocaine,Donnatal)   Chris [name] is likely having chest pain due to GERD and GI upset today.  After about 15 minutes the GI cocktail caused him to burp several times and feel better.  The tenderness in his abdomen  resolved, as did the CP.  Discussed that I cannot totally rule- out cardiac pain here in clinic and offered to have him evaluated in the ED.  However, he declined a cardiac rule- out  at this time.  Suggested that he add some fiber to his diet to help with regularity and bloating.  He will also go back on his nexium or try maalox.      COPLAND,JESSICA, MD  Called around 5:40 pm- he is feeling better.

## 2012-03-14 NOTE — Patient Instructions (Addendum)
If your chest discomfort gets worse or does not continue to feel better please seek help.  See Dr. Eden Emms as planned next month.  If you start having severe CP or get worried go to the ER  Add some fiber cereal to your diet- about a 1/2 cup every day of fiber one or all bran.    Go back on your nexium and/ or try maalox.

## 2012-03-14 NOTE — Telephone Encounter (Signed)
Called to check on him- he is feeling ok.  Will check on him tomorrow- if ok can keep appt with Dr. [name] in January, otherwise plan to move it up

## 2012-04-12 ENCOUNTER — Other Ambulatory Visit: Payer: Self-pay | Admitting: *Deleted

## 2012-04-12 DIAGNOSIS — I2510 Atherosclerotic heart disease of native coronary artery without angina pectoris: Secondary | ICD-10-CM

## 2012-04-12 DIAGNOSIS — E785 Hyperlipidemia, unspecified: Secondary | ICD-10-CM

## 2012-04-13 ENCOUNTER — Encounter: Payer: Self-pay | Admitting: Cardiovascular Disease

## 2012-04-13 ENCOUNTER — Other Ambulatory Visit (INDEPENDENT_AMBULATORY_CARE_PROVIDER_SITE_OTHER): Payer: PRIVATE HEALTH INSURANCE

## 2012-04-13 ENCOUNTER — Ambulatory Visit (INDEPENDENT_AMBULATORY_CARE_PROVIDER_SITE_OTHER): Payer: PRIVATE HEALTH INSURANCE | Admitting: Cardiovascular Disease

## 2012-04-13 VITALS — BP 136/84 | HR 66 | Ht 69.0 in | Wt 203.8 lb

## 2012-04-13 DIAGNOSIS — E785 Hyperlipidemia, unspecified: Secondary | ICD-10-CM

## 2012-04-13 DIAGNOSIS — I2510 Atherosclerotic heart disease of native coronary artery without angina pectoris: Secondary | ICD-10-CM

## 2012-04-13 LAB — LIPID PANEL
Cholesterol: 113 mg/dL (ref 0–200)
LDL Cholesterol: 65 mg/dL (ref 0–99)
Triglycerides: 84 mg/dL (ref 0.0–149.0)

## 2012-04-13 LAB — HEPATIC FUNCTION PANEL
ALT: 37 U/L (ref 0–53)
AST: 30 U/L (ref 0–37)
Albumin: 4 g/dL (ref 3.5–5.2)
Alkaline Phosphatase: 72 U/L (ref 39–117)
Bilirubin, Direct: 0.1 mg/dL (ref 0.0–0.3)
Total Protein: 7.4 g/dL (ref 6.0–8.3)

## 2012-04-13 LAB — BASIC METABOLIC PANEL
BUN: 14 mg/dL (ref 6–23)
Chloride: 104 mEq/L (ref 96–112)
Creatinine, Ser: 0.9 mg/dL (ref 0.4–1.5)
GFR: 98.89 mL/min (ref >60.00)

## 2012-04-13 NOTE — Assessment & Plan Note (Signed)
We'll check fasting lipids on him today.  We'll see him again in 6 months for followup office visit and for this.

## 2012-04-13 NOTE — Patient Instructions (Addendum)
Your physician recommends that you return for a FASTING lipid profile: today and in 6 months , bmet lipid hepatic panel  Your physician wants you to follow-up in: 6 months  You will receive a reminder letter in the mail two months in advance. If you don't receive a letter, please call our office to schedule the follow-up appointment.

## 2012-04-13 NOTE — Progress Notes (Signed)
Chris [name] Date of Birth  13-Feb-1956 [facility] [address]     [address] [place], [place]  [address] [phone]  Fax  [phone]  Problems: 1. Coronary artery disease- He presented in 2009 with some episodes of chest pain and was found to have an inferior wall myocardial infarction. He underwent successful PTCA and stenting of his mid  right coronary artery using a 2.75 x 28 mm Promus stent. The stent was post dilated using a 3.0 noncompliant balloon.  We then positioned a 3.0 x 15 mm Promus stent in the proximal right coronary artery.  The stent was post dilated using a 3.25 mm noncompliant balloon. 2. Hyperlipidemia 3. Sleep apnea 4. Hypertension  History of Present Illness:  Chris [name] is a 57 year old gentleman. He has a history of coronary artery disease. He presented in 2009 with some episodes of chest pain and was found to have an inferior wall myocardial infarction. He underwent successful PTCA and stenting of his mid  right coronary artery using a 2.75 x 28 mm Promus stent. The stent was post dilated using a 3.0 noncompliant balloon.  We then positioned a 3.0 x 15 mm Promus stent in the proximal right coronary artery.  The stent was post dilated using a 3.25 mm noncompliant balloon.  Pt is doing well.  No angina or dyspnea.    He has not been sleeping well.  He also did not take his meds this am.  April 13, 2012: Chris [name] is doing well from a cardiac standpoint.  Exercising on the treadmill every other day. Complains of constipation.     Current Outpatient Prescriptions on File Prior to Visit  Medication Sig Dispense Refill  . aspirin 81 MG tablet Take 81 mg by mouth daily.        [name] atorvastatin (LIPITOR) 40 MG tablet Take 1 tablet (40 mg total) by mouth daily.  30 tablet  11  . clopidogrel (PLAVIX) 75 MG tablet TAKE 1 TABLET (75 MG TOTAL) BY MOUTH DAILY.  30 tablet  5  . esomeprazole (NEXIUM) 40 MG capsule One tablet by mouth  before breakfast daily  30 capsule  4  . ipratropium (ATROVENT) 0.03 % nasal spray Place 2 sprays into the nose 2 (two) times daily.  30 mL  1  . metoprolol succinate (TOPROL-XL) 25 MG 24 hr tablet Take 0.5 tablets (12.5 mg total) by mouth 2 (two) times daily.  30 tablet  2  . Omega-3 Fatty Acids (FISH OIL) 1200 MG CAPS Take 1 capsule by mouth daily.        . tadalafil (CIALIS) 10 MG tablet Take 1 tablet (10 mg total) by mouth daily as needed for erectile dysfunction.  20 tablet  1    Allergies  Allergen Reactions  . Ace Inhibitors     REACTION: Rash  . Ropinirole Hcl Rash    Past Medical History  Diagnosis Date  . Acute myocardial infarction, unspecified site, episode of care unspecified   . Other and unspecified hyperlipidemia   . Coronary atherosclerosis of unspecified type of vessel, native or graft   . Sleep apnea     Past Surgical History  Procedure Date  . Cholecystectomy   . Coronary stent placement     History  Smoking status  . Former Smoker  . Quit date: 08/22/2007  Smokeless tobacco  . Not on file    History  Alcohol Use No    Family History  Problem Relation  Age of Onset  . Heart disease Mother   . Prostate cancer Brother     Reviw of Systems:  Reviewed in the HPI.  All other systems are negative.  Physical Exam: BP 136/84  Pulse 66  Ht 5\' 9"  (1.753 m)  Wt 203 lb 12.8 oz (92.443 kg)  BMI 30.10 kg/m2  SpO2 96% The patient is alert and oriented x 3.  The mood and affect are normal.   Skin: warm and dry.  Color is normal.    HEENT:   the sclera are nonicteric.  The mucous membranes are moist.  The carotids are 2+ without bruits.  There is no thyromegaly.  There is no JVD.    Lungs: clear.  The chest wall is non tender.    Heart: regular rate with a normal S1 and S2.  There are no murmurs, gallops, or rubs. The PMI is not displaced.     Abdomen: good bowel sounds.  There is no guarding or rebound.  There is no hepatosplenomegaly or tenderness.   There are no masses.   Extremities:  no clubbing, cyanosis, or edema.  The legs are without rashes.  The distal pulses are intact.   Neuro:  Cranial nerves II - XII are intact.  Motor and sensory functions are intact.    The gait is normal.  EKG:  Assessment / Plan:

## 2012-04-13 NOTE — Assessment & Plan Note (Signed)
Chris [name] is doing very well from a cardiac standpoint. He's not had any episodes of chest pain. He exercises on a daily basis. We'll continue the same medications. We'll check fasting lipids today. We will also check hepatic profile and basic metabolic profile.

## 2012-04-22 ENCOUNTER — Ambulatory Visit: Payer: PRIVATE HEALTH INSURANCE | Admitting: Cardiovascular Disease

## 2012-04-22 ENCOUNTER — Other Ambulatory Visit: Payer: PRIVATE HEALTH INSURANCE

## 2012-05-09 ENCOUNTER — Other Ambulatory Visit: Payer: Self-pay | Admitting: Cardiovascular Disease

## 2012-05-09 NOTE — Telephone Encounter (Signed)
Per refill line  Pt sates he needs refill - no verification of pharmacy

## 2012-05-11 MED ORDER — METOPROLOL SUCCINATE ER 25 MG PO TB24: 12.5000 mg | ORAL_TABLET | Freq: Two times a day (BID) | ORAL | Status: DC

## 2012-05-11 NOTE — Telephone Encounter (Signed)
Fax Received. Refill Completed. Terie Lear Chowoe (R.M.A)   

## 2012-05-12 ENCOUNTER — Other Ambulatory Visit: Payer: Self-pay | Admitting: *Deleted

## 2012-05-12 NOTE — Telephone Encounter (Signed)
Opened in Error.

## 2012-05-30 ENCOUNTER — Other Ambulatory Visit: Payer: Self-pay | Admitting: *Deleted

## 2012-05-30 MED ORDER — CLOPIDOGREL BISULFATE 75 MG PO TABS: 75.0000 mg | ORAL_TABLET | Freq: Every day | ORAL | Status: DC

## 2012-05-30 NOTE — Telephone Encounter (Signed)
Fax Received. Refill Completed. Chris [name] (R.M.A)   

## 2012-06-06 ENCOUNTER — Ambulatory Visit (INDEPENDENT_AMBULATORY_CARE_PROVIDER_SITE_OTHER): Payer: PRIVATE HEALTH INSURANCE | Admitting: Emergency Medicine

## 2012-06-06 ENCOUNTER — Telehealth: Payer: Self-pay

## 2012-06-06 VITALS — BP 120/78 | HR 80 | Temp 97.8°F | Resp 16 | Ht 69.0 in | Wt 206.0 lb

## 2012-06-06 DIAGNOSIS — S335XXA Sprain of ligaments of lumbar spine, initial encounter: Secondary | ICD-10-CM

## 2012-06-06 MED ORDER — CYCLOBENZAPRINE HCL 10 MG PO TABS: 10.0000 mg | ORAL_TABLET | Freq: Three times a day (TID) | ORAL | Status: DC | PRN

## 2012-06-06 MED ORDER — NAPROXEN SODIUM 550 MG PO TABS: 550.0000 mg | ORAL_TABLET | Freq: Two times a day (BID) | ORAL | Status: DC

## 2012-06-06 NOTE — Progress Notes (Signed)
[facility] [address] [place] [address] [phone]  Date:  06/06/2012   Name:  Chris [name]   DOB:  Oct 01, 1955   MRN:  [id number]  PCP:  Shade Flood, MD    Chief Complaint: Back Pain and Nasal Congestion   History of Present Illness:  Chris [name] is a 57 y.o. very pleasant male patient who presents with the following:  Working in the yard cutting storm debris with a chain saw.  Now has pain in his low back.  Non radiating.  No numbness, tingling, or weakness.  No history of back injury.  No improvement with over the counter medications or other home remedies. Denies other complaint or health concern today.   Patient Active Problem List  Diagnosis  . HYPERLIPIDEMIA  . OBSTRUCTIVE SLEEP APNEA  . MYOCARDIAL INFARCTION  . CAD  . RESTLESS LEGS SYNDROME    Past Medical History  Diagnosis Date  . Acute myocardial infarction, unspecified site, episode of care unspecified   . Other and unspecified hyperlipidemia   . Coronary atherosclerosis of unspecified type of vessel, native or graft   . Sleep apnea     Past Surgical History  Procedure Laterality Date  . Cholecystectomy    . Coronary stent placement      History  Substance Use Topics  . Smoking status: Former Smoker    Quit date: 08/22/2007  . Smokeless tobacco: Not on file  . Alcohol Use: Yes     Comment: very little    Family History  Problem Relation Age of Onset  . Heart disease Mother   . Prostate cancer Brother     Allergies  Allergen Reactions  . Ace Inhibitors     REACTION: Rash  . Ropinirole Hcl Rash    Medication list has been reviewed and updated.  Current Outpatient Prescriptions on File Prior to Visit  Medication Sig Dispense Refill  . aspirin 81 MG tablet Take 81 mg by mouth daily.        [name] atorvastatin (LIPITOR) 40 MG tablet Take 1 tablet (40 mg total) by mouth daily.  30 tablet  11  . clopidogrel (PLAVIX) 75 MG tablet Take 1 tablet (75 mg total)  by mouth daily.  30 tablet  5  . esomeprazole (NEXIUM) 40 MG capsule One tablet by mouth before breakfast daily  30 capsule  4  . ipratropium (ATROVENT) 0.03 % nasal spray Place 2 sprays into the nose 2 (two) times daily.  30 mL  1  . metoprolol succinate (TOPROL-XL) 25 MG 24 hr tablet Take 0.5 tablets (12.5 mg total) by mouth 2 (two) times daily.  30 tablet  6  . Omega-3 Fatty Acids (FISH OIL) 1200 MG CAPS Take 1 capsule by mouth daily.        . tadalafil (CIALIS) 10 MG tablet Take 1 tablet (10 mg total) by mouth daily as needed for erectile dysfunction.  20 tablet  1   No current facility-administered medications on file prior to visit.    Review of Systems:  As per HPI, otherwise negative.    Physical Examination: Filed Vitals:   06/06/12 0857  BP: 120/78  Pulse: 80  Temp: 97.8 F (36.6 C)  Resp: 16   Filed Vitals:   06/06/12 0857  Height: 5\' 9"  (1.753 m)  Weight: 206 lb (93.441 kg)   Body mass index is 30.41 kg/(m^2). Ideal Body Weight: Weight in (lb) to have BMI = 25: 168.9  GEN: WDWN, NAD, Non-toxic, Alert & Oriented x 3 HEENT: Atraumatic, Normocephalic.  Ears and Nose: No external deformity. EXTR: No clubbing/cyanosis/edema NEURO: Normal gait.  PSYCH: Normally interactive. Conversant. Not depressed or anxious appearing.  Calm demeanor.  BACK:  Tender lumbar para spinous muscles.  Neuro intact  Assessment and Plan: Lumbar strain Anaprox Flexeril Local heat  Carmelina Dane, MD

## 2012-06-06 NOTE — Patient Instructions (Addendum)

## 2012-06-06 NOTE — Telephone Encounter (Signed)
Please advise 

## 2012-06-06 NOTE — Telephone Encounter (Signed)
Pt was here today and said he thought he would get some allergy medication would like someone to call him about this [phone]

## 2012-06-07 ENCOUNTER — Telehealth: Payer: Self-pay

## 2012-06-07 NOTE — Telephone Encounter (Signed)
Pt called back to get clarification as to what type of meds he was Rxd. He states that they really "wipe him out" and he has been unable to go into work today. I advised pt that it is probably the Flexeril causing this and he may only be able to take it at night. Pt requests OOW note for yesterday and today and he will plan on trying to return to work tomorrow and only take the [employer] after work. Dr Dareen Piano, [facility] for the OOW note for both days?

## 2012-06-07 NOTE — Telephone Encounter (Signed)
Patient advised.

## 2012-06-07 NOTE — Telephone Encounter (Signed)
sure

## 2012-06-07 NOTE — Telephone Encounter (Signed)
Note provided for patient. He called stating he is on his way to pick it up.

## 2012-06-07 NOTE — Telephone Encounter (Signed)
Let him know he can take claritin or allegra which are OTC.  There are no prescription drugs.

## 2012-07-04 ENCOUNTER — Other Ambulatory Visit: Payer: Self-pay | Admitting: *Deleted

## 2012-07-04 MED ORDER — ATORVASTATIN CALCIUM 40 MG PO TABS: 40.0000 mg | ORAL_TABLET | Freq: Every day | ORAL | Status: DC

## 2012-07-04 NOTE — Telephone Encounter (Signed)
Fax Received. Refill Completed. Chris [name] (R.M.A)   

## 2012-07-13 ENCOUNTER — Ambulatory Visit (INDEPENDENT_AMBULATORY_CARE_PROVIDER_SITE_OTHER): Payer: PRIVATE HEALTH INSURANCE | Admitting: Physician Assistant

## 2012-07-13 VITALS — BP 128/79 | HR 60 | Temp 98.2°F | Resp 16 | Wt 204.0 lb

## 2012-07-13 DIAGNOSIS — L237 Allergic contact dermatitis due to plants, except food: Secondary | ICD-10-CM

## 2012-07-13 DIAGNOSIS — L255 Unspecified contact dermatitis due to plants, except food: Secondary | ICD-10-CM

## 2012-07-13 DIAGNOSIS — L299 Pruritus, unspecified: Secondary | ICD-10-CM

## 2012-07-13 MED ORDER — METHYLPREDNISOLONE ACETATE 80 MG/ML IJ SUSP
80.0000 mg | Freq: Once | INTRAMUSCULAR | Status: AC
  Administered 2012-07-13: 80 mg via INTRAMUSCULAR

## 2012-07-13 MED ORDER — PREDNISONE 20 MG PO TABS: ORAL_TABLET | ORAL | Status: DC

## 2012-07-13 MED ORDER — TRIAMCINOLONE ACETONIDE 0.1 % EX CREA: TOPICAL_CREAM | CUTANEOUS | Status: DC

## 2012-07-13 NOTE — Progress Notes (Signed)
Patient ID: Chris [name] MRN: [id number], DOB: October 23, 1955, 57 y.o. Date of Encounter: 07/13/2012, 1:14 PM  Primary Physician: Shade Flood, MD  Chief Complaint: Pruritic rash  HPI: 57 y.o. male with presents with a 3-4 day history of mildly erythematous pruritic rash along right distal arm and proximal hand. Patient was doing hitting golf balls into a field and picking them up prior to the development of the rash. Has not yet washed all clothing or linens that have been exposed. Has tried calamine lotion with minimal relief. Patient is otherwise doing well without issues or complaints.  Past Medical History  Diagnosis Date  . Acute myocardial infarction, unspecified site, episode of care unspecified   . Other and unspecified hyperlipidemia   . Coronary atherosclerosis of unspecified type of vessel, native or graft   . Sleep apnea   . Allergy      Home Meds: Prior to Admission medications   Medication Sig Start Date End Date Taking? Authorizing Provider  aspirin 81 MG tablet Take 81 mg by mouth daily.     Yes Historical Provider, MD  atorvastatin (LIPITOR) 40 MG tablet Take 1 tablet (40 mg total) by mouth daily. 07/04/12 07/04/13 Yes Vesta Mixer, MD  clopidogrel (PLAVIX) 75 MG tablet Take 1 tablet (75 mg total) by mouth daily. 05/30/12  Yes Vesta Mixer, MD  esomeprazole (NEXIUM) 40 MG capsule One tablet by mouth before breakfast daily 02/23/12  Yes Vesta Mixer, MD  metoprolol succinate (TOPROL-XL) 25 MG 24 hr tablet Take 0.5 tablets (12.5 mg total) by mouth 2 (two) times daily. 05/09/12  Yes Vesta Mixer, MD  Multiple Vitamin (MULTIVITAMIN) tablet Take 1 tablet by mouth daily.   Yes Historical Provider, MD  Omega-3 Fatty Acids (FISH OIL) 1200 MG CAPS Take 1 capsule by mouth daily.     Yes Historical Provider, MD  tadalafil (CIALIS) 10 MG tablet Take 1 tablet (10 mg total) by mouth daily as needed for erectile dysfunction. 09/10/11  Yes Vesta Mixer, MD             Allergies:  Allergies  Allergen Reactions  . Ace Inhibitors     REACTION: Rash  . Ropinirole Hcl Rash    History   Social History  . Marital Status: Married    Spouse Name: N/A    Number of Children: N/A  . Years of Education: N/A   Occupational History  . manager    Social History Main Topics  . Smoking status: Former Smoker    Quit date: 08/22/2007  . Smokeless tobacco: Not on file  . Alcohol Use: Yes     Comment: very little  . Drug Use: No  . Sexually Active: Yes   Other Topics Concern  . Not on file   Social History Narrative  . No narrative on file     Review of Systems: Constitutional: negative for chills, fever, or fatigue  Cardiovascular: negative for chest pain or palpitations Respiratory: negative for hemoptysis, wheezing, shortness of breath, or cough Dermatological: see above   Physical Exam: Blood pressure 128/79, pulse 60, temperature 98.2 F (36.8 C), temperature source Oral, resp. rate 16, weight 204 lb (92.534 kg)., Body mass index is 30.11 kg/(m^2). General: Well developed, well nourished, in no acute distress. Head: Normocephalic, atraumatic, eyes without discharge, sclera non-icteric, nares are without discharge.  Neck: Supple. Full ROM. Lungs: Clear bilaterally to auscultation without wheezes, rales, or rhonchi. Breathing is unlabored. Heart: RRR with S1 S2. No  murmurs, rubs, or gallops appreciated. Msk:  Strength and tone normal for age. Extremities/Skin: Warm and dry. Dried calamine lotion along right distal arm. Portion of this cleared away for the exam. Multiple vesicular lesions along right distal arm extending to proximal hand consistent with poison ivy. No secondary infections present. No clubbing or cyanosis. No edema.  Neuro: Alert and oriented X 3. Moves all extremities spontaneously. Gait is normal. CNII-XII grossly in tact. Psych:  Responds to questions appropriately with a normal affect.     ASSESSMENT AND PLAN:  57  y.o. male with poison ivy right distal arm, no secondary infection  -DepoMedrol 80 mg IM -Prednisone 20 mg 3x2, 2x2, 1x2 #12 no RF, SED -Triamcinolone topical cream 0.1% Apply to affected area bid #45 grams RF 2 -Zyrtec -Zantac -Benadryl -Wash all contaminated clothes and linens -RTC 10 days if symptoms persist, sooner if they worsen   Signed, Eula Listen, PA-C 07/13/2012 1:14 PM

## 2012-07-25 ENCOUNTER — Other Ambulatory Visit: Payer: Self-pay | Admitting: *Deleted

## 2012-07-25 DIAGNOSIS — N529 Male erectile dysfunction, unspecified: Secondary | ICD-10-CM | POA: Insufficient documentation

## 2012-07-25 MED ORDER — TADALAFIL 10 MG PO TABS: 10.0000 mg | ORAL_TABLET | Freq: Every day | ORAL | Status: DC | PRN

## 2012-07-25 NOTE — Progress Notes (Signed)
Refill request done. 

## 2012-10-10 ENCOUNTER — Encounter: Payer: Self-pay | Admitting: Cardiovascular Disease

## 2012-10-10 ENCOUNTER — Emergency Department (HOSPITAL_COMMUNITY)
Admission: EM | Admit: 2012-10-10 | Discharge: 2012-10-10 | Disposition: A | Discharge status: Home / Self Care | Payer: PRIVATE HEALTH INSURANCE | Attending: Emergency Medicine | Admitting: Emergency Medicine

## 2012-10-10 ENCOUNTER — Emergency Department (HOSPITAL_COMMUNITY): Payer: PRIVATE HEALTH INSURANCE

## 2012-10-10 ENCOUNTER — Encounter (HOSPITAL_COMMUNITY): Payer: Self-pay | Admitting: Physician Assistant

## 2012-10-10 DIAGNOSIS — I252 Old myocardial infarction: Secondary | ICD-10-CM | POA: Insufficient documentation

## 2012-10-10 DIAGNOSIS — Z9861 Coronary angioplasty status: Secondary | ICD-10-CM | POA: Insufficient documentation

## 2012-10-10 DIAGNOSIS — R072 Precordial pain: Secondary | ICD-10-CM | POA: Insufficient documentation

## 2012-10-10 DIAGNOSIS — I2510 Atherosclerotic heart disease of native coronary artery without angina pectoris: Secondary | ICD-10-CM | POA: Insufficient documentation

## 2012-10-10 DIAGNOSIS — E785 Hyperlipidemia, unspecified: Secondary | ICD-10-CM | POA: Insufficient documentation

## 2012-10-10 DIAGNOSIS — R0602 Shortness of breath: Secondary | ICD-10-CM | POA: Insufficient documentation

## 2012-10-10 DIAGNOSIS — Z8679 Personal history of other diseases of the circulatory system: Secondary | ICD-10-CM

## 2012-10-10 DIAGNOSIS — I200 Unstable angina: Secondary | ICD-10-CM | POA: Diagnosis present

## 2012-10-10 DIAGNOSIS — Z7982 Long term (current) use of aspirin: Secondary | ICD-10-CM | POA: Insufficient documentation

## 2012-10-10 DIAGNOSIS — Z87891 Personal history of nicotine dependence: Secondary | ICD-10-CM | POA: Insufficient documentation

## 2012-10-10 DIAGNOSIS — R079 Chest pain, unspecified: Secondary | ICD-10-CM

## 2012-10-10 DIAGNOSIS — Z79899 Other long term (current) drug therapy: Secondary | ICD-10-CM | POA: Insufficient documentation

## 2012-10-10 HISTORY — DX: ST elevation (STEMI) myocardial infarction involving other coronary artery of inferior wall: I21.19

## 2012-10-10 LAB — CBC WITH DIFFERENTIAL/PLATELET
Basophils Relative: 0 % (ref 0–1)
Eosinophils Absolute: 0.1 10*3/uL (ref 0.0–0.7)
Eosinophils Relative: 2 % (ref 0–5)
HCT: 38.8 % — ABNORMAL LOW (ref 39.0–52.0)
Hemoglobin: 13.7 g/dL (ref 13.0–17.0)
MCH: 30.7 pg (ref 26.0–34.0)
MCHC: 35.3 g/dL (ref 30.0–36.0)
MCV: 87 fL (ref 78.0–100.0)
Monocytes Absolute: 0.4 10*3/uL (ref 0.1–1.0)
Monocytes Relative: 8 % (ref 3–12)
Neutrophils Relative %: 62 % (ref 43–77)

## 2012-10-10 LAB — COMPREHENSIVE METABOLIC PANEL
Albumin: 3.6 g/dL (ref 3.5–5.2)
BUN: 9 mg/dL (ref 6–23)
Calcium: 9.3 mg/dL (ref 8.4–10.5)
Creatinine, Ser: 0.8 mg/dL (ref 0.50–1.35)
GFR calc Af Amer: >90 mL/min (ref >90)
Glucose, Bld: 117 mg/dL — ABNORMAL HIGH (ref 70–99)
Total Protein: 6.7 g/dL (ref 6.0–8.3)

## 2012-10-10 LAB — POCT I-STAT TROPONIN I: Troponin i, poc: 0.01 ng/mL (ref 0.00–0.08)

## 2012-10-10 MED ORDER — ASPIRIN 81 MG PO CHEW
234.0000 mg | CHEWABLE_TABLET | Freq: Once | ORAL | Status: AC
  Administered 2012-10-10: 243 mg via ORAL
  Filled 2012-10-10: qty 3

## 2012-10-10 NOTE — Consult Note (Signed)
CARDIOLOGY CONSULT NOTE   Patient ID: Chris [name] MRN: [id number] DOB/AGE: 05-04-1955 57 y.o.  Admit date: 10/10/2012  Primary Physician   Shade Flood, MD Primary Cardiologist   Nahser Reason for Consultation   Chest pain  NGE:XBMW Chris [name] is a 57 y.o. male with a history of CAD.  Pt has been in usual state of health recently. He has not been exercising as much as he used to but was able to do several miles on the treadmill last week without difficulty.  Yesterday, he developed substernal chest tightness all across his chest. It reached no more than a 5/10. It was associated with some shortness of breath but no nausea, or vomiting. The episode was prolonged. Exertion seemed to make it worse. It did not change with deep inspiration, position or meals. There is no chest wall tenderness.  He did not take anything for the pain. It was not like his history of reflux symptoms. He is not clear on what his anginal symptoms were at the time of his MI but does not think he had this pain at that time. He has never had pain like this before. He had pain again this morning and went to work. The symptoms seemed to get worse with exertion at work and he came to the emergency room. In the emergency room, he is currently resting comfortably and his pain has resolved. The only medication he received in the emergency room his aspirin. He is compliant with his medications. He has not restarted smoking since he quit in 2009.  Past Medical History  Diagnosis Date  . Acute MI, inferior wall, initial episode of care 2009  . Other and unspecified hyperlipidemia   . Coronary atherosclerosis of unspecified type of vessel, native or graft   . Sleep apnea   . Allergy      Past Surgical History  Procedure Laterality Date  . Cholecystectomy    . Coronary stent placement  2009     2.75 x 28 mm Promus stent to the RCA   Allergies  Allergen Reactions  . Ace Inhibitors Rash    REACTION: Rash  .  Ropinirole Hcl Rash    I have reviewed the patient's current medications Prior to Admission medications   Medication Sig Start Date End Date Taking? Authorizing Provider  aspirin 81 MG tablet Take 81 mg by mouth daily.     Yes Historical Provider, MD  atorvastatin (LIPITOR) 40 MG tablet Take 1 tablet (40 mg total) by mouth daily. 07/04/12 07/04/13 Yes Vesta Mixer, MD  clopidogrel (PLAVIX) 75 MG tablet Take 1 tablet (75 mg total) by mouth daily. 05/30/12  Yes Vesta Mixer, MD  esomeprazole (NEXIUM) 40 MG capsule Take 40 mg by mouth daily as needed (acid reflux).   Yes Historical Provider, MD  metoprolol succinate (TOPROL-XL) 25 MG 24 hr tablet Take 0.5 tablets (12.5 mg total) by mouth 2 (two) times daily. 05/09/12  Yes Vesta Mixer, MD  Multiple Vitamin (MULTIVITAMIN) tablet Take 1 tablet by mouth daily.   Yes Historical Provider, MD  Omega-3 Fatty Acids (FISH OIL) 1200 MG CAPS Take 1 capsule by mouth daily.     Yes Historical Provider, MD  triamcinolone cream (KENALOG) 0.1 % Apply 1 application topically 2 (two) times daily as needed (poison oak).   Yes Historical Provider, MD  tadalafil (CIALIS) 10 MG tablet Take 1 tablet (10 mg total) by mouth daily as needed for erectile dysfunction. 07/25/12   Deloris Ping  Nahser, MD     History   Social History  . Marital Status: Married    Spouse Name: N/A    Number of Children: N/A  . Years of Education: N/A   Occupational History  . 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 Smoking status: Former Smoker    Quit date: 08/22/2007  . Smokeless tobacco: Not on file  . Alcohol Use: Yes     Comment: very little  . Drug Use: No  . Sexually Active: Yes   Other Topics Concern  . Not on file   Social History Narrative   Lives with wife. Exercises occasionally.    Family Status  Relation Status Death Age  . Mother Alive     Born [phone]  . Father Deceased 106    No CAD   Family History  Problem Relation Age of  Onset  . Heart disease Mother   . Prostate cancer Brother      ROS:  Full 14 point review of systems complete and found to be negative unless listed above.  Physical Exam: Blood pressure 139/80, pulse 58, temperature 98 F (36.7 C), temperature source Oral, resp. rate 15, SpO2 99.00%.  General: Well developed, well nourished, male in no acute distress Head: Eyes PERRLA, No xanthomas.   Normocephalic and atraumatic, oropharynx without edema or exudate.  Lungs: Few rales but generally good air exchange Heart: HRRR S1 S2, no rub/gallop, no murmur. pulses are 2+ all 4 extrem.   Neck: No carotid bruits. No lymphadenopathy.  JVD not elevated. Abdomen: Bowel sounds present, abdomen soft and non-tender without masses or hernias noted. Msk:  No spine or cva tenderness. No weakness, no joint deformities or effusions. Extremities: No clubbing or cyanosis. No edema.  Neuro: Alert and oriented X 3. No focal deficits noted. Psych:  Good affect, responds appropriately Skin: No rashes or lesions noted.  Labs:   Lab Results  Component Value Date   WBC 5.5 10/10/2012   HGB 13.7 10/10/2012   HCT 38.8* 10/10/2012   MCV 87.0 10/10/2012   PLT 276 10/10/2012   No results found for this basename: INR,  in the last 72 hours   Recent Labs Lab 10/10/12 0941  NA 134*  K 4.0  CL 100  CO2 25  BUN 9  CREATININE 0.80  CALCIUM 9.3  PROT 6.7  BILITOT 0.5  ALKPHOS 72  ALT 28  AST 23  GLUCOSE 117*    Recent Labs  10/10/12 0950  TROPIPOC 0.01   Lab Results  Component Value Date   CHOL 113 04/13/2012   HDL 31.20* 04/13/2012   LDLCALC 65 04/13/2012   TRIG 84.0 04/13/2012    Echo: 08/31/2007 LEFT VENTRICLE: - Left ventricular size was normal. - Overall left ventricular systolic function was normal. - There were no left ventricular regional wall motion abnormalities. - Left ventricular wall thickness was normal. MITRAL VALVE: Doppler interpretation(s): - There was no evidence for mitral  stenosis. - There was no significant mitral valvular regurgitation. LEFT ATRIUM: - Left atrial size was normal. RIGHT VENTRICLE: - Right ventricular size was normal. - Right ventricular systolic function was normal. PERICARDIUM: - There was no pericardial effusion. SUMMARY - Overall left ventricular systolic function was normal. There were no left ventricular regional wall motion abnormalities.  ECG:   Sinus rhythm, no acute ischemic changes   Radiology:  Dg Chest [facility] 1 View 10/10/2012   *RADIOLOGY REPORT*  Clinical Data: Chest tightness.  Cough.  PORTABLE CHEST - 1 VIEW  Comparison: Chest x-ray 03/14/2012.  Findings: Lung volumes are normal.  No consolidative airspace disease.  No pleural effusions.  No pneumothorax.  No pulmonary nodule or mass noted.  Pulmonary vasculature and the cardiomediastinal silhouette are within normal limits.   Radiopaque density projecting over the left side of the heart appears to represent a EKG lead with no wire attached.  IMPRESSION: 1. No radiographic evidence of acute cardiopulmonary disease.   Original Report Authenticated By: Trudie Reed, M.D.    ASSESSMENT AND PLAN:   The patient was seen today by Dr Myrtis Ser, the patient evaluated and the data reviewed.   Principal Problem:   Chest tightness - Pt had multiple hours of tightness yesterday without enzyme changes or ECG changes. OK to eat now as he is pain-free. I have discussed his symptoms with him at length. This morning he walked on the treadmill. This did not affect the tightness in his chest. He does mention that he did do some strenuous pulling on a lawnmower that would not start the other day. Enzyme is normal even though he had several hours of symptoms yesterday and this morning. There is no EKG change. After very careful consideration he and I have decided to allow him to go home today. We are arranging a exercise nuclear study tomorrow as an outpatient. If he has any worsening symptoms, he will  return to the hospital. Otherwise he will have his stress study as an outpatient tomorrow. We are also arranging early follow up with his primary cardiologist.  Active Problems:   CAD - continue current meds, reinforce CRF reduction, f/u in office.   SignedTheodore Demark, PA-C 10/10/2012 12:31 PM Beeper [phone] Patient seen and examined. I agree with the assessment and plan as detailed above. See also my additional thoughts below.   I have been involved in all aspects of the patient's evaluation today. Part of the note written above is written by me. I have personally decided that it would be safe for the patient to go home today. He will have an outpatient followup stress test tomorrow. If he has any worsening of his symptoms, he will return to the emergency room.  Willa Rough, MD, [facility] 10/10/2012 2:13 PM

## 2012-10-10 NOTE — ED Notes (Signed)
Pt to ed with cp.  sts started 2 days ago, hx of mi 4-5 yrs ago.  Feels more like chest tightness.

## 2012-10-10 NOTE — ED Provider Notes (Addendum)
History    CSN: [id number] Arrival date & time 10/10/12  0840  First MD Initiated Contact with Patient 10/10/12 0845     Chief Complaint  Patient presents with  . Chest Pain   (Consider location/radiation/quality/duration/timing/severity/associated sxs/prior Treatment) HPI Comments: Patient with history of CAD with stent placement 4-5 years ago.  He is followed by Dr. [name] at [place].  He presents today with complaints of tightness in the chest that started two days ago and has been occurring intermittently since.  The discomfort is associated with shortness of breath but no nausea, diaphoresis, or radiation to the arm or jaw.  It seems to be worse when he gets up and moves around and is relieved when he sits and rests.  He is unsure as to whether or not this is similar to his prior cardiac symptoms.  No cough, fever.    Patient is a 57 y.o. male presenting with chest pain. The history is provided by the patient.  Chest Pain Pain location:  Substernal area Pain quality: tightness   Pain radiates to:  Does not radiate Pain radiates to the back: no   Pain severity:  Moderate Onset quality:  Sudden Timing:  Intermittent Progression:  Worsening Chronicity:  New Context: not breathing   Relieved by:  Nothing Worsened by:  Movement Ineffective treatments:  None tried Risk factors: coronary artery disease    Past Medical History  Diagnosis Date  . Acute myocardial infarction, unspecified site, episode of care unspecified   . Other and unspecified hyperlipidemia   . Coronary atherosclerosis of unspecified type of vessel, native or graft   . Sleep apnea   . Allergy    Past Surgical History  Procedure Laterality Date  . Cholecystectomy    . Coronary stent placement     Family History  Problem Relation Age of Onset  . Heart disease Mother   . Prostate cancer Brother    History  Substance Use Topics  . Smoking status: Former Smoker    Quit date: 08/22/2007  . Smokeless  tobacco: Not on file  . Alcohol Use: Yes     Comment: very little    Review of Systems  Cardiovascular: Positive for chest pain.  All other systems reviewed and are negative.    Allergies  Ace inhibitors and Ropinirole hcl  Home Medications   Current Outpatient Rx  Name  Route  Sig  Dispense  Refill  . aspirin 81 MG tablet   Oral   Take 81 mg by mouth daily.           [name] atorvastatin (LIPITOR) 40 MG tablet   Oral   Take 1 tablet (40 mg total) by mouth daily.   30 tablet   5   . clopidogrel (PLAVIX) 75 MG tablet   Oral   Take 1 tablet (75 mg total) by mouth daily.   30 tablet   5   . esomeprazole (NEXIUM) 40 MG capsule   Oral   Take 40 mg by mouth daily as needed (acid reflux).         . metoprolol succinate (TOPROL-XL) 25 MG 24 hr tablet   Oral   Take 0.5 tablets (12.5 mg total) by mouth 2 (two) times daily.   30 tablet   6   . Multiple Vitamin (MULTIVITAMIN) tablet   Oral   Take 1 tablet by mouth daily.         . Omega-3 Fatty Acids (FISH OIL) 1200 MG CAPS  Oral   Take 1 capsule by mouth daily.           [name] triamcinolone cream (KENALOG) 0.1 %   Topical   Apply 1 application topically 2 (two) times daily as needed (poison oak).         . tadalafil (CIALIS) 10 MG tablet   Oral   Take 1 tablet (10 mg total) by mouth daily as needed for erectile dysfunction.   20 tablet   1    BP 133/89  Pulse 68  Temp(Src) 98 F (36.7 C) (Oral)  Resp 20  SpO2 98% Physical Exam  Nursing note and vitals reviewed. Constitutional: He is oriented to person, place, and time. He appears well-developed and well-nourished. No distress.  HENT:  Head: Normocephalic and atraumatic.  Mouth/Throat: Oropharynx is clear and moist.  Neck: Normal range of motion. Neck supple.  Cardiovascular: Normal rate, regular rhythm and normal heart sounds.   No murmur heard. Pulmonary/Chest: Effort normal and breath sounds normal. No respiratory distress. He has no wheezes. He  exhibits no tenderness.  Abdominal: Soft. Bowel sounds are normal. He exhibits no distension. There is no tenderness.  Musculoskeletal: Normal range of motion. He exhibits no edema.  Lymphadenopathy:    He has no cervical adenopathy.  Neurological: He is alert and oriented to person, place, and time.  Skin: Skin is warm and dry. He is not diaphoretic.    ED Course  Procedures (including critical care time) Labs Reviewed  CBC WITH DIFFERENTIAL  COMPREHENSIVE METABOLIC PANEL   No results found. No diagnosis found.   Date: 10/10/2012  Rate: 65  Rhythm: normal sinus rhythm  QRS Axis: left  Intervals: normal  ST/T Wave abnormalities: normal  Conduction Disutrbances:none  Narrative Interpretation:   Old EKG Reviewed: unchanged    MDM  The patient presents with complaints of chest discomfort occurring intermittently for the past two days.  He has had stents in the past but has had no problems since they were placed.  Today's workup reveals a negative troponin and unchanged ekg.  He was given aspirin and is currently pain-free.  I have spoken with [facility] cardiology who will see the patient in the ED and determine the disposition.  Geoffery Lyons, MD 10/10/12 1044    Patient seen by Dr. Myrtis Ser.  He recommends discharge and will arrange a follow up appointment for a stress test tomorrow. Will discharge to home.  Geoffery Lyons, MD 10/10/12 1346

## 2012-10-11 ENCOUNTER — Ambulatory Visit (HOSPITAL_COMMUNITY): Payer: PRIVATE HEALTH INSURANCE | Attending: Family Medicine | Admitting: Radiology

## 2012-10-11 VITALS — BP 130/78 | HR 61 | Ht 69.0 in | Wt 200.0 lb

## 2012-10-11 DIAGNOSIS — R002 Palpitations: Secondary | ICD-10-CM | POA: Insufficient documentation

## 2012-10-11 DIAGNOSIS — Z9861 Coronary angioplasty status: Secondary | ICD-10-CM | POA: Insufficient documentation

## 2012-10-11 DIAGNOSIS — I252 Old myocardial infarction: Secondary | ICD-10-CM | POA: Insufficient documentation

## 2012-10-11 DIAGNOSIS — R079 Chest pain, unspecified: Secondary | ICD-10-CM

## 2012-10-11 DIAGNOSIS — I442 Atrioventricular block, complete: Secondary | ICD-10-CM

## 2012-10-11 DIAGNOSIS — I4510 Unspecified right bundle-branch block: Secondary | ICD-10-CM

## 2012-10-11 DIAGNOSIS — Z8249 Family history of ischemic heart disease and other diseases of the circulatory system: Secondary | ICD-10-CM | POA: Insufficient documentation

## 2012-10-11 DIAGNOSIS — I2510 Atherosclerotic heart disease of native coronary artery without angina pectoris: Secondary | ICD-10-CM

## 2012-10-11 MED ORDER — TECHNETIUM TC 99M SESTAMIBI GENERIC - CARDIOLITE
10.0000 | Freq: Once | INTRAVENOUS | Status: AC | PRN
  Administered 2012-10-11: 10 via INTRAVENOUS

## 2012-10-11 MED ORDER — TECHNETIUM TC 99M SESTAMIBI GENERIC - CARDIOLITE
30.0000 | Freq: Once | INTRAVENOUS | Status: AC | PRN
  Administered 2012-10-11: 30 via INTRAVENOUS

## 2012-10-11 NOTE — Progress Notes (Signed)
[facility] SITE 3 NUCLEAR MED [address] [place], [place] [address] [phone]    Cardiology Nuclear Med Study  Chris [name] is a 57 y.o. male     MRN : [id number]     DOB: 1955/12/13  Procedure Date: 10/11/2012  Nuclear Med Background Indication for Stress Test:  Evaluation for Ischemia, PTCA/Stent Patency, and Patient seen in ED on 10/10/12 for chest pain with negative enzymes History:  '09 IWMI>PTCA/Stent x 2; '09 Echo:normal LVF; '12 OZH:YQMVHQIO infarct, no ischemia, EF=65% Cardiac Risk Factors: Family History - CAD, History of Smoking, Hypertension, Lipids and IRBBB  Symptoms:  Chest Tightness with and without Exertion (last episode of chest discomfort was this morning, 4/10; none now), Diaphoresis and Palpitations   Nuclear Pre-Procedure Caffeine/Decaff Intake:  None 12 hrs NPO After: 10:30pm   Lungs:  Clear. O2 Sat: 99% on room air. IV 0.9% NS with Angio Cath:  22g  IV Site: R Antecubital x 1, tolerated well IV Started by:  Irean Hong, RN  Chest Size (in):  44 Cup Size: n/a  Height: 5\' 9"  (1.753 m)  Weight:  200 lb (90.719 kg)  BMI:  Body mass index is 29.52 kg/(m^2). Tech Comments:  Held Toprol x 24 hrs    Nuclear Med Study 1 or 2 day study: 1 day  Stress Test Type:  Stress  Reading MD: Olga Millers, MD  Order Authorizing Provider:  Kristeen Miss, MD  Resting Radionuclide: Technetium 50m Sestamibi  Resting Radionuclide Dose: 10.7 mCi   Stress Radionuclide:  Technetium 33m Sestamibi  Stress Radionuclide Dose: 33.0 mCi           Stress Protocol Rest HR: 61 Stress HR: 141  Rest BP: 130/78 Stress BP: 192/80  Exercise Time (min): 11:00 METS: 13.4   Predicted Max HR: 163 bpm % Max HR: 86.5 bpm Rate Pressure Product: 96295   Dose of Adenosine (mg):  n/a Dose of Lexiscan: n/a mg  Dose of Atropine (mg): n/a Dose of Dobutamine: n/a mcg/kg/min (at max HR)  Stress Test Technologist: Smiley Houseman, CMA-N  Nuclear Technologist:  Domenic Polite,  CNMT     Rest Procedure:  Myocardial perfusion imaging was performed at rest 45 minutes following the intravenous administration of Technetium 30m Sestamibi.  Rest ECG: Sinus rhythm, PVC, IRBBB.  Stress Procedure:  The patient exercised on the treadmill utilizing the Bruce Protocol for eleven minutes. The patient stopped due to fatigue and denied any chest pain.  Technetium 22m Sestamibi was injected at peak exercise and myocardial perfusion imaging was performed after a brief delay.  Stress ECG: No significant ST segment change suggestive of ischemia.  QPS Raw Data Images:  Acquisition technically good; normal left ventricular size. Stress Images:  There is decreased uptake in the inferior wall. Rest Images:  There is decreased uptake in the inferior wall. Subtraction (SDS):  No evidence of ischemia. Transient Ischemic Dilatation (Normal <1.22):  n/a Lung/Heart Ratio (Normal <0.45):  0.46  Quantitative Gated Spect Images QGS EDV:  78 ml QGS ESV:  29 ml  Impression Exercise Capacity:  Good exercise capacity. BP Response:  Normal blood pressure response. Clinical Symptoms:  No chest pain or dyspnea. ECG Impression:  No significant ST segment change suggestive of ischemia. Comparison with Prior Nuclear Study: No significant change from previous study 01/23/10.  Overall Impression:  Low risk stress nuclear study with a moderate size, severe intensity, fixed inferior defect consistent with prior inferior infarct and no significant ischemia.  LV Ejection Fraction:  63%.  LV Wall Motion:  NL LV Function; NL Wall Motion   Olga Millers

## 2012-10-17 ENCOUNTER — Other Ambulatory Visit (INDEPENDENT_AMBULATORY_CARE_PROVIDER_SITE_OTHER): Payer: PRIVATE HEALTH INSURANCE

## 2012-10-17 ENCOUNTER — Other Ambulatory Visit: Payer: Self-pay | Admitting: *Deleted

## 2012-10-17 ENCOUNTER — Encounter: Payer: Self-pay | Admitting: Cardiovascular Disease

## 2012-10-17 ENCOUNTER — Ambulatory Visit (INDEPENDENT_AMBULATORY_CARE_PROVIDER_SITE_OTHER): Payer: PRIVATE HEALTH INSURANCE | Admitting: Cardiovascular Disease

## 2012-10-17 VITALS — BP 134/82 | HR 59 | Ht 69.0 in | Wt 200.0 lb

## 2012-10-17 DIAGNOSIS — I219 Acute myocardial infarction, unspecified: Secondary | ICD-10-CM

## 2012-10-17 DIAGNOSIS — E785 Hyperlipidemia, unspecified: Secondary | ICD-10-CM

## 2012-10-17 DIAGNOSIS — I2510 Atherosclerotic heart disease of native coronary artery without angina pectoris: Secondary | ICD-10-CM

## 2012-10-17 LAB — LIPID PANEL
Cholesterol: 110 mg/dL (ref 0–200)
LDL Cholesterol: 47 mg/dL (ref 0–99)
Triglycerides: 154 mg/dL — ABNORMAL HIGH (ref 0.0–149.0)
VLDL: 30.8 mg/dL (ref 0.0–40.0)

## 2012-10-17 LAB — BASIC METABOLIC PANEL
Chloride: 105 mEq/L (ref 96–112)
Creatinine, Ser: 0.9 mg/dL (ref 0.4–1.5)
GFR: 88.98 mL/min (ref >60.00)
Potassium: 4.2 mEq/L (ref 3.5–5.1)

## 2012-10-17 LAB — HEPATIC FUNCTION PANEL
ALT: 22 U/L (ref 0–53)
Total Bilirubin: 0.5 mg/dL (ref 0.3–1.2)

## 2012-10-17 NOTE — Progress Notes (Signed)
Fasting lab orders placed

## 2012-10-17 NOTE — Assessment & Plan Note (Signed)
Brennen  seems to be doing well. He has been having some episodes of arm pain and shoulder pain. He had a stress Myoview study which was negative. He exercised for 11 minutes and did not have any episodes of pain. The Myoview revealed an old inferior wall myocardial infarction. He had no evidence of ischemia.  We will continue with the same medications. I'll see him again in 6 months for followup office visit, fasting labs, and EKG.

## 2012-10-17 NOTE — Progress Notes (Signed)
Chris [name] Date of Birth  1955-09-15 [facility] / [facility] [address]     [address] [place], [place]  [address] [phone]  Fax  [phone]  Problems: 1. Coronary artery disease- Chris [name] presented in 2009 with some episodes of chest pain and was found to have an inferior wall myocardial infarction. Chris [name] underwent successful PTCA and stenting of his mid  right coronary artery using a 2.75 x 28 mm Promus stent. The stent was post dilated using a 3.0 noncompliant balloon.  We then positioned a 3.0 x 15 mm Promus stent in the proximal right coronary artery.  The stent was post dilated using a 3.25 mm noncompliant balloon. 2. Hyperlipidemia 3. Sleep apnea 4. Hypertension  History of Present Illness:  Chris [name] is a 57 year old gentleman. Chris [name] has a history of coronary artery disease. Chris [name] presented in 2009 with some episodes of chest pain and was found to have an inferior wall myocardial infarction. Chris [name] underwent successful PTCA and stenting of his mid  right coronary artery using a 2.75 x 28 mm Promus stent. The stent was post dilated using a 3.0 noncompliant balloon.  We then positioned a 3.0 x 15 mm Promus stent in the proximal right coronary artery.  The stent was post dilated using a 3.25 mm noncompliant balloon.  Pt is doing well.  No angina or dyspnea.    Chris [name] has not been sleeping well.  Chris [name] also did not take his meds this am.  April 13, 2012: Chris [name] is doing well from a cardiac standpoint.  Exercising on the treadmill every other day. Complains of constipation.  October 17, 2012:  Thank started having some episodes of chest pain last week.  Chris [name] had been doing lots of physical activity - played 36 holes of golf a day  for 2 days straight.  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     Chris [name] had a stress Myoview study. Chris [name] walked for 11 minutes on a standard Bruce protocol triple test. A Myoview study revealed an old inferior wall myocardial infarction but was otherwise  unremarkable.      Current Outpatient Prescriptions on File Prior to Visit  Medication Sig Dispense Refill  . aspirin 81 MG tablet Take 81 mg by mouth daily.        [name] atorvastatin (LIPITOR) 40 MG tablet Take 1 tablet (40 mg total) by mouth daily.  30 tablet  5  . clopidogrel (PLAVIX) 75 MG tablet Take 1 tablet (75 mg total) by mouth daily.  30 tablet  5  . esomeprazole (NEXIUM) 40 MG capsule Take 40 mg by mouth daily as needed (acid reflux).      . metoprolol succinate (TOPROL-XL) 25 MG 24 hr tablet Take 0.5 tablets (12.5 mg total) by mouth 2 (two) times daily.  30 tablet  6  . Multiple Vitamin (MULTIVITAMIN) tablet Take 1 tablet by mouth daily.      . Omega-3 Fatty Acids (FISH OIL) 1200 MG CAPS Take 2 capsules by mouth 2 (two) times daily at 10 AM and 5 PM.       . tadalafil (CIALIS) 10 MG tablet Take 1 tablet (10 mg total) by mouth daily as needed for erectile dysfunction.  20 tablet  1   No current facility-administered medications on file prior to visit.    Allergies  Allergen Reactions  . Ace Inhibitors Rash    REACTION: Rash  . Ropinirole Hcl Rash    Past Medical History  Diagnosis  Date  . Acute MI, inferior wall, initial episode of care 2009  . Other and unspecified hyperlipidemia   . Coronary atherosclerosis of unspecified type of vessel, native or graft   . Sleep apnea   . Allergy     Past Surgical History  Procedure Laterality Date  . Cholecystectomy    . Coronary stent placement  2009     2.75 x 28 mm Promus stent to the RCA    History  Smoking status  . Former Smoker  . Quit date: 08/22/2007  Smokeless tobacco  . Not on file    History  Alcohol Use  . Yes    Comment: very little    Family History  Problem Relation Age of Onset  . Heart disease Mother   . Prostate cancer Brother     Reviw of Systems:  Reviewed in the HPI.  All other systems are negative.  Physical Exam: BP 134/82  Pulse 59  Ht 5\' 9"  (1.753 m)  Wt 200 lb (90.719 kg)  BMI  29.52 kg/m2 The patient is alert and oriented x 3.  The mood and affect are normal.   Skin: warm and dry.  Color is normal.    HEENT:   the sclera are nonicteric.  The mucous membranes are moist.  The carotids are 2+ without bruits.  There is no thyromegaly.  There is no JVD.    Lungs: clear.  The chest wall is non tender.    Heart: regular rate with a normal S1 and S2.  There are no murmurs, gallops, or rubs. The PMI is not displaced.     Abdomen: good bowel sounds.  There is no guarding or rebound.  There is no hepatosplenomegaly or tenderness.  There are no masses.   Extremities:  no clubbing, cyanosis, or edema.  The legs are without rashes.  The distal pulses are intact.   Neuro:  Cranial nerves II - XII are intact.  Motor and sensory functions are intact.    The gait is normal.  EKG:  October 17, 2012:   Sinus brady at 15.  Inc. RBBB. Old inferior MI.   Assessment / Plan:

## 2012-10-17 NOTE — Patient Instructions (Signed)
Your physician wants you to follow-up in: 6 months with ekg  You will receive a reminder letter in the mail two months in advance. If you don't receive a letter, please call our office to schedule the follow-up appointment.  Your physician recommends that you return for a FASTING lipid profile: 6 months

## 2012-11-08 ENCOUNTER — Ambulatory Visit (INDEPENDENT_AMBULATORY_CARE_PROVIDER_SITE_OTHER): Payer: PRIVATE HEALTH INSURANCE | Admitting: Internal Medicine

## 2012-11-08 VITALS — BP 120/78 | HR 65 | Temp 98.2°F | Resp 18 | Ht 69.0 in | Wt 204.0 lb

## 2012-11-08 DIAGNOSIS — M25572 Pain in left ankle and joints of left foot: Secondary | ICD-10-CM

## 2012-11-08 DIAGNOSIS — M25579 Pain in unspecified ankle and joints of unspecified foot: Secondary | ICD-10-CM

## 2012-11-08 DIAGNOSIS — R609 Edema, unspecified: Secondary | ICD-10-CM

## 2012-11-08 DIAGNOSIS — M722 Plantar fascial fibromatosis: Secondary | ICD-10-CM

## 2012-11-08 DIAGNOSIS — R600 Localized edema: Secondary | ICD-10-CM

## 2012-11-08 MED ORDER — PREDNISONE 10 MG PO TABS: ORAL_TABLET | ORAL | Status: DC

## 2012-11-08 NOTE — Progress Notes (Signed)
  Subjective:    Patient ID: Chris [name], male    DOB: 04/19/1955, 57 y.o.   MRN: [id number]  HPI  Patient has pain in both feet for a couple of days. Swelling in left foot. He was seen before      Here for the same symptoms. No fever in the feet. Some swelling in the left leg.    Review of Systems     Objective:   Physical Exam        Assessment & Plan:

## 2012-11-08 NOTE — Patient Instructions (Signed)
Plantar Fasciitis (Heel Spur Syndrome)  with Rehab  The plantar fascia is a fibrous, ligament-like, soft-tissue structure that spans the bottom of the foot. Plantar fasciitis is a condition that causes pain in the foot due to inflammation of the tissue.  SYMPTOMS   · Pain and tenderness on the underneath side of the foot.  · Pain that worsens with standing or walking.  CAUSES   Plantar fasciitis is caused by irritation and injury to the plantar fascia on the underneath side of the foot. Common mechanisms of injury include:  · Direct trauma to bottom of the foot.  · Damage to a small nerve that runs under the foot where the main fascia attaches to the heel bone.  · Stress placed on the plantar fascia due to bone spurs.  RISK INCREASES WITH:   · Activities that place stress on the plantar fascia (running, jumping, pivoting, or cutting).  · Poor strength and flexibility.  · Improperly fitted shoes.  · Tight calf muscles.  · Flat feet.  · Failure to warm-up properly before activity.  · Obesity.  PREVENTION  · Warm up and stretch properly before activity.  · Allow for adequate recovery between workouts.  · Maintain physical fitness:  · Strength, flexibility, and endurance.  · Cardiovascular fitness.  · Maintain a health body weight.  · Avoid stress on the plantar fascia.  · Wear properly fitted shoes, including arch supports for individuals who have flat feet.  PROGNOSIS   If treated properly, then the symptoms of plantar fasciitis usually resolve without surgery. However, occasionally surgery is necessary.  RELATED COMPLICATIONS   · Recurrent symptoms that may result in a chronic condition.  · Problems of the lower back that are caused by compensating for the injury, such as limping.  · Pain or weakness of the foot during push-off following surgery.  · Chronic inflammation, scarring, and partial or complete fascia tear, occurring more often from repeated injections.  TREATMENT   Treatment initially involves the use of  ice and medication to help reduce pain and inflammation. The use of strengthening and stretching exercises may help reduce pain with activity, especially stretches of the Achilles tendon. These exercises may be performed at home or with a therapist. Your caregiver may recommend that you use heel cups of arch supports to help reduce stress on the plantar fascia. Occasionally, corticosteroid injections are given to reduce inflammation. If symptoms persist for greater than 6 months despite non-surgical (conservative), then surgery may be recommended.   MEDICATION   · If pain medication is necessary, then nonsteroidal anti-inflammatory medications, such as aspirin and ibuprofen, or other minor pain relievers, such as acetaminophen, are often recommended.  · Do not take pain medication within 7 days before surgery.  · Prescription pain relievers may be given if deemed necessary by your caregiver. Use only as directed and only as much as you need.  · Corticosteroid injections may be given by your caregiver. These injections should be reserved for the most serious cases, because they may only be given a certain number of times.  HEAT AND COLD  · Cold treatment (icing) relieves pain and reduces inflammation. Cold treatment should be applied for 10 to 15 minutes every 2 to 3 hours for inflammation and pain and immediately after any activity that aggravates your symptoms. Use ice packs or massage the area with a piece of ice (ice massage).  · Heat treatment may be used prior to performing the stretching and strengthening activities prescribed   by your caregiver, physical therapist, or athletic trainer. Use a heat pack or soak the injury in warm water.  SEEK IMMEDIATE MEDICAL CARE IF:  · Treatment seems to offer no benefit, or the condition worsens.  · Any medications produce adverse side effects.  EXERCISES  RANGE OF MOTION (ROM) AND STRETCHING EXERCISES - Plantar Fasciitis (Heel Spur Syndrome)  These exercises may help you  when beginning to rehabilitate your injury. Your symptoms may resolve with or without further involvement from your physician, physical therapist or athletic trainer. While completing these exercises, remember:   · Restoring tissue flexibility helps normal motion to return to the joints. This allows healthier, less painful movement and activity.  · An effective stretch should be held for at least 30 seconds.  · A stretch should never be painful. You should only feel a gentle lengthening or release in the stretched tissue.  RANGE OF MOTION - Toe Extension, Flexion  · Sit with your right / left leg crossed over your opposite knee.  · Grasp your toes and gently pull them back toward the top of your foot. You should feel a stretch on the bottom of your toes and/or foot.  · Hold this stretch for __________ seconds.  · Now, gently pull your toes toward the bottom of your foot. You should feel a stretch on the top of your toes and or foot.  · Hold this stretch for __________ seconds.  Repeat __________ times. Complete this stretch __________ times per day.   RANGE OF MOTION - Ankle Dorsiflexion, Active Assisted  · Remove shoes and sit on a chair that is preferably not on a carpeted surface.  · Place right / left foot under knee. Extend your opposite leg for support.  · Keeping your heel down, slide your right / left foot back toward the chair until you feel a stretch at your ankle or calf. If you do not feel a stretch, slide your bottom forward to the edge of the chair, while still keeping your heel down.  · Hold this stretch for __________ seconds.  Repeat __________ times. Complete this stretch __________ times per day.   STRETCH  Gastroc, Standing  · Place hands on wall.  · Extend right / left leg, keeping the front knee somewhat bent.  · Slightly point your toes inward on your back foot.  · Keeping your right / left heel on the floor and your knee straight, shift your weight toward the wall, not allowing your back to  arch.  · You should feel a gentle stretch in the right / left calf. Hold this position for __________ seconds.  Repeat __________ times. Complete this stretch __________ times per day.  STRETCH  Soleus, Standing  · Place hands on wall.  · Extend right / left leg, keeping the other knee somewhat bent.  · Slightly point your toes inward on your back foot.  · Keep your right / left heel on the floor, bend your back knee, and slightly shift your weight over the back leg so that you feel a gentle stretch deep in your back calf.  · Hold this position for __________ seconds.  Repeat __________ times. Complete this stretch __________ times per day.  STRETCH  Gastrocsoleus, Standing   Note: This exercise can place a lot of stress on your foot and ankle. Please complete this exercise only if specifically instructed by your caregiver.   · Place the ball of your right / left foot on a step, keeping   your other foot firmly on the same step.  · Hold on to the wall or a rail for balance.  · Slowly lift your other foot, allowing your body weight to press your heel down over the edge of the step.  · You should feel a stretch in your right / left calf.  · Hold this position for __________ seconds.  · Repeat this exercise with a slight bend in your right / left knee.  Repeat __________ times. Complete this stretch __________ times per day.   STRENGTHENING EXERCISES - Plantar Fasciitis (Heel Spur Syndrome)   These exercises may help you when beginning to rehabilitate your injury. They may resolve your symptoms with or without further involvement from your physician, physical therapist or athletic trainer. While completing these exercises, remember:   · Muscles can gain both the endurance and the strength needed for everyday activities through controlled exercises.  · Complete these exercises as instructed by your physician, physical therapist or athletic trainer. Progress the resistance and repetitions only as guided.  STRENGTH - Towel  Curls  · Sit in a chair positioned on a non-carpeted surface.  · Place your foot on a towel, keeping your heel on the floor.  · Pull the towel toward your heel by only curling your toes. Keep your heel on the floor.  · If instructed by your physician, physical therapist or athletic trainer, add ____________________ at the end of the towel.  Repeat __________ times. Complete this exercise __________ times per day.  STRENGTH - Ankle Inversion  · Secure one end of a rubber exercise band/tubing to a fixed object (table, pole). Loop the other end around your foot just before your toes.  · Place your fists between your knees. This will focus your strengthening at your ankle.  · Slowly, pull your big toe up and in, making sure the band/tubing is positioned to resist the entire motion.  · Hold this position for __________ seconds.  · Have your muscles resist the band/tubing as it slowly pulls your foot back to the starting position.  Repeat __________ times. Complete this exercises __________ times per day.   Document Released: 03/09/2005 Document Revised: 06/01/2011 Document Reviewed: 06/21/2008  [employer]® Patient Information ©2014 [employer].

## 2012-11-08 NOTE — Progress Notes (Signed)
  Subjective:    Patient ID: Chris [name], male    DOB: [birth year], 57 y.o.   MRN: [id number]  HPI Has chronic foot pain bilateral. Feet and ankles swell end of the day No new trauma and no focal joint pain. Is overweight   Review of Systems     Objective:   Physical Exam  Constitutional: He is oriented to person, place, and time. He appears well-developed and well-nourished.  HENT:  Head: Normocephalic.  Eyes: EOM are normal. No scleral icterus.  Neck: Normal range of motion.  Cardiovascular: Normal rate, regular rhythm and normal heart sounds.   Pulmonary/Chest: Effort normal and breath sounds normal.  Musculoskeletal: He exhibits edema and tenderness.       Right foot: He exhibits tenderness and swelling. He exhibits normal range of motion, no bony tenderness, normal capillary refill, no crepitus and no deformity.       Left foot: He exhibits tenderness and swelling. He exhibits normal range of motion, no bony tenderness, normal capillary refill, no crepitus, no deformity and no laceration.  Neurological: He is alert and oriented to person, place, and time. He has normal strength. No cranial nerve deficit or sensory deficit. He exhibits normal muscle tone. Coordination and gait normal.  Skin: No rash noted.  Psychiatric: He has a normal mood and affect.          Assessment & Plan:  Foot pain/Swelling Plantar fasciitis/Varicose veins/Chronic strain

## 2012-11-16 ENCOUNTER — Other Ambulatory Visit: Payer: Self-pay | Admitting: Cardiovascular Disease

## 2012-11-16 NOTE — Telephone Encounter (Signed)
Fax Received. Refill Completed. Chris [name] (R.M.A)   

## 2012-11-17 ENCOUNTER — Other Ambulatory Visit: Payer: Self-pay | Admitting: Cardiovascular Disease

## 2012-11-17 NOTE — Telephone Encounter (Signed)
Dr [name] has been filling/ see name on refill hx

## 2012-11-23 ENCOUNTER — Encounter: Payer: Self-pay | Admitting: Cardiovascular Disease

## 2012-11-29 ENCOUNTER — Telehealth: Payer: Self-pay | Admitting: Cardiovascular Disease

## 2012-11-29 NOTE — Telephone Encounter (Signed)
PT DOES NOT NEED   TO SEE DR Harlow Ohms  January 2015  NOV. 2014  APPT CX .Zack Seal

## 2012-11-29 NOTE — Telephone Encounter (Signed)
New Prob    Pt has some questions regarding his next appt and if its necessary for him to come in.

## 2012-12-02 ENCOUNTER — Other Ambulatory Visit: Payer: Self-pay | Admitting: Cardiovascular Disease

## 2012-12-21 ENCOUNTER — Ambulatory Visit: Payer: PRIVATE HEALTH INSURANCE | Admitting: Cardiovascular Disease

## 2013-01-04 ENCOUNTER — Other Ambulatory Visit: Payer: Self-pay | Admitting: Cardiovascular Disease

## 2013-02-02 ENCOUNTER — Ambulatory Visit: Payer: PRIVATE HEALTH INSURANCE | Admitting: Cardiovascular Disease

## 2013-03-31 ENCOUNTER — Other Ambulatory Visit: Payer: Self-pay | Admitting: Cardiovascular Disease

## 2013-04-03 ENCOUNTER — Ambulatory Visit (INDEPENDENT_AMBULATORY_CARE_PROVIDER_SITE_OTHER): Payer: PRIVATE HEALTH INSURANCE | Admitting: Physician Assistant

## 2013-04-03 VITALS — BP 132/72 | HR 85 | Temp 99.2°F | Resp 16 | Ht 69.0 in | Wt 206.0 lb

## 2013-04-03 DIAGNOSIS — Z23 Encounter for immunization: Secondary | ICD-10-CM

## 2013-04-03 DIAGNOSIS — H6123 Impacted cerumen, bilateral: Secondary | ICD-10-CM

## 2013-04-03 DIAGNOSIS — B9789 Other viral agents as the cause of diseases classified elsewhere: Principal | ICD-10-CM

## 2013-04-03 DIAGNOSIS — J069 Acute upper respiratory infection, unspecified: Secondary | ICD-10-CM

## 2013-04-03 DIAGNOSIS — H6120 Impacted cerumen, unspecified ear: Secondary | ICD-10-CM

## 2013-04-03 MED ORDER — HYDROCODONE-HOMATROPINE 5-1.5 MG/5ML PO SYRP: 5.0000 mL | ORAL_SOLUTION | Freq: Three times a day (TID) | ORAL | Status: DC | PRN

## 2013-04-03 MED ORDER — CETIRIZINE HCL 10 MG PO TABS: 10.0000 mg | ORAL_TABLET | Freq: Every day | ORAL | Status: DC

## 2013-04-03 MED ORDER — BENZONATATE 100 MG PO CAPS: 100.0000 mg | ORAL_CAPSULE | Freq: Three times a day (TID) | ORAL | Status: DC | PRN

## 2013-04-03 MED ORDER — IPRATROPIUM BROMIDE 0.03 % NA SOLN: 2.0000 | Freq: Two times a day (BID) | NASAL | Status: DC

## 2013-04-03 NOTE — Patient Instructions (Signed)
Begin taking the cetirizine once daily - this will help dry up runny nose and post-nasal drainage  Use the ipratropium nasal spray 2-3 times per day to help with congestion and runny nose  Use the benzonatate cough pills every 8 hours as needed  Use the Hycodan syrup at bed time for cough relief - this will likely make you sleepy  Drink plenty of fluids (water is best!) and get plenty of rest  If any symptoms are worsening or not improving, please let us know   Upper Respiratory Infection, Adult An upper respiratory infection (URI) is also sometimes known as the common cold. The upper respiratory tract includes the nose, sinuses, throat, trachea, and bronchi. Bronchi are the airways leading to the lungs. Most people improve within 1 week, but symptoms can last up to 2 weeks. A residual cough may last even longer.  CAUSES Many different viruses can infect the tissues lining the upper respiratory tract. The tissues become irritated and inflamed and often become very moist. Mucus production is also common. A cold is contagious. You can easily spread the virus to others by oral contact. This includes kissing, sharing a glass, coughing, or sneezing. Touching your mouth or nose and then touching a surface, which is then touched by another person, can also spread the virus. SYMPTOMS  Symptoms typically develop 1 to 3 days after you come in contact with a cold virus. Symptoms vary from person to person. They may include:  Runny nose.  Sneezing.  Nasal congestion.  Sinus irritation.  Sore throat.  Loss of voice (laryngitis).  Cough.  Fatigue.  Muscle aches.  Loss of appetite.  Headache.  Low-grade fever. DIAGNOSIS  You might diagnose your own cold based on familiar symptoms, since most people get a cold 2 to 3 times a year. Your caregiver can confirm this based on your exam. Most importantly, your caregiver can check that your symptoms are not due to another disease such as strep  throat, sinusitis, pneumonia, asthma, or epiglottitis. Blood tests, throat tests, and X-rays are not necessary to diagnose a common cold, but they may sometimes be helpful in excluding other more serious diseases. Your caregiver will decide if any further tests are required. RISKS AND COMPLICATIONS  You may be at risk for a more severe case of the common cold if you smoke cigarettes, have chronic heart disease (such as heart failure) or lung disease (such as asthma), or if you have a weakened immune system. The very young and very old are also at risk for more serious infections. Bacterial sinusitis, middle ear infections, and bacterial pneumonia can complicate the common cold. The common cold can worsen asthma and chronic obstructive pulmonary disease (COPD). Sometimes, these complications can require emergency medical care and may be life-threatening. PREVENTION  The best way to protect against getting a cold is to practice good hygiene. Avoid oral or hand contact with people with cold symptoms. Wash your hands often if contact occurs. There is no clear evidence that vitamin C, vitamin E, echinacea, or exercise reduces the chance of developing a cold. However, it is always recommended to get plenty of rest and practice good nutrition. TREATMENT  Treatment is directed at relieving symptoms. There is no cure. Antibiotics are not effective, because the infection is caused by a virus, not by bacteria. Treatment may include:  Increased fluid intake. Sports drinks offer valuable electrolytes, sugars, and fluids.  Breathing heated mist or steam (vaporizer or shower).  Eating chicken soup or other  clear broths, and maintaining good nutrition.  Getting plenty of rest.  Using gargles or lozenges for comfort.  Controlling fevers with ibuprofen or acetaminophen as directed by your caregiver.  Increasing usage of your inhaler if you have asthma. Zinc gel and zinc lozenges, taken in the first 24 hours of  the common cold, can shorten the duration and lessen the severity of symptoms. Pain medicines may help with fever, muscle aches, and throat pain. A variety of non-prescription medicines are available to treat congestion and runny nose. Your caregiver can make recommendations and may suggest nasal or lung inhalers for other symptoms.  HOME CARE INSTRUCTIONS   Only take over-the-counter or prescription medicines for pain, discomfort, or fever as directed by your caregiver.  Use a warm mist humidifier or inhale steam from a shower to increase air moisture. This may keep secretions moist and make it easier to breathe.  Drink enough water and fluids to keep your urine clear or pale yellow.  Rest as needed.  Return to work when your temperature has returned to normal or as your caregiver advises. You may need to stay home longer to avoid infecting others. You can also use a face mask and careful hand washing to prevent spread of the virus. SEEK MEDICAL CARE IF:   After the first few days, you feel you are getting worse rather than better.  You need your caregiver's advice about medicines to control symptoms.  You develop chills, worsening shortness of breath, or brown or red sputum. These may be signs of pneumonia.  You develop yellow or brown nasal discharge or pain in the face, especially when you bend forward. These may be signs of sinusitis.  You develop a fever, swollen neck glands, pain with swallowing, or white areas in the back of your throat. These may be signs of strep throat. SEEK IMMEDIATE MEDICAL CARE IF:   You have a fever.  You develop severe or persistent headache, ear pain, sinus pain, or chest pain.  You develop wheezing, a prolonged cough, cough up blood, or have a change in your usual mucus (if you have chronic lung disease).  You develop sore muscles or a stiff neck. Document Released: 09/02/2000 Document Revised: 06/01/2011 Document Reviewed: 07/11/2010 [facility]  Patient Information 2014 [place], [place].

## 2013-04-03 NOTE — Progress Notes (Signed)
   Subjective:    Patient ID: Chris [name], male    DOB: 1956-03-04, 58 y.o.   MRN: [id number]  HPI   Chris [name] is a pleasant 58 yr old male here with concern for illness.  Reports cough productive of "nasty yellow stuff," nasal congestion, nasal drainage, scratchy throat, headache.  All symptoms began 4 days ago.  Has used coricidin and left over nasal spray.  Also tried some local honey for symptoms.  No fever.  No sick contacts.  Former smoker.  Denies SOB or wheezing.    Would also like a flu shot today   Review of Systems  Constitutional: Negative for fever and chills.  HENT: Positive for congestion, rhinorrhea and sore throat. Negative for ear pain.   Respiratory: Positive for cough. Negative for shortness of breath and wheezing.   Cardiovascular: Negative.   Gastrointestinal: Negative.   Musculoskeletal: Negative.   Skin: Negative.   Neurological: Positive for headaches.       Objective:   Physical Exam  Vitals reviewed. Constitutional: He is oriented to person, place, and time. He appears well-developed and well-nourished. No distress.  HENT:  Head: Normocephalic and atraumatic.  Nose: Mucosal edema and rhinorrhea present. Right sinus exhibits no maxillary sinus tenderness and no frontal sinus tenderness. Left sinus exhibits no maxillary sinus tenderness and no frontal sinus tenderness.  Mouth/Throat: Uvula is midline, oropharynx is clear and moist and mucous membranes are normal.  Cerumen impaction bilaterally  Eyes: Conjunctivae are normal. No scleral icterus.  Neck: Neck supple.  Cardiovascular: Normal rate, regular rhythm and normal heart sounds.   Pulmonary/Chest: Effort normal and breath sounds normal. He has no wheezes. He has no rales.  Abdominal: Soft. There is no tenderness.  Lymphadenopathy:    He has no cervical adenopathy.  Neurological: He is oriented to person, place, and time.  Skin: Skin is warm and dry.  Psychiatric: He has a normal mood and  affect. His behavior is normal.   Bilateral ears successfully irrigated     Assessment & Plan:  Viral URI with cough - Plan: cetirizine (ZYRTEC) 10 MG tablet, benzonatate (TESSALON) 100 MG capsule, ipratropium (ATROVENT) 0.03 % nasal spray, HYDROcodone-homatropine (HYCODAN) 5-1.5 MG/5ML syrup  Need for prophylactic vaccination and inoculation against influenza - Plan: Flu Vaccine QUAD 36+ mos IM  Cerumen impaction, bilateral   Chris [name] is a pleasant 58 yr old male here with 4 days of URI symptoms, likely viral etiology.  Will treat symptomatically.   Push fluids, rest.  Work note provided.  Pt to call or RTC if worsening or not improving  Flu shot administered today.  Ears successfully irrigated  Chris [name] MHS, PA-C [facility] 1/12/201511:02 AM

## 2013-04-13 ENCOUNTER — Other Ambulatory Visit: Payer: Self-pay | Admitting: *Deleted

## 2013-04-13 DIAGNOSIS — E785 Hyperlipidemia, unspecified: Secondary | ICD-10-CM

## 2013-05-12 ENCOUNTER — Other Ambulatory Visit: Payer: Self-pay

## 2013-05-12 ENCOUNTER — Other Ambulatory Visit: Payer: Self-pay | Admitting: Cardiovascular Disease

## 2013-05-12 MED ORDER — ATORVASTATIN CALCIUM 40 MG PO TABS: ORAL_TABLET | ORAL | Status: DC

## 2013-05-18 ENCOUNTER — Other Ambulatory Visit: Payer: PRIVATE HEALTH INSURANCE

## 2013-05-18 ENCOUNTER — Ambulatory Visit: Payer: PRIVATE HEALTH INSURANCE | Admitting: Cardiovascular Disease

## 2013-05-28 ENCOUNTER — Other Ambulatory Visit: Payer: Self-pay | Admitting: Cardiovascular Disease

## 2013-06-01 ENCOUNTER — Other Ambulatory Visit: Payer: Self-pay | Admitting: Cardiovascular Disease

## 2013-06-12 ENCOUNTER — Other Ambulatory Visit: Payer: Self-pay | Admitting: Cardiovascular Disease

## 2013-06-14 ENCOUNTER — Encounter: Payer: Self-pay | Admitting: Cardiovascular Disease

## 2013-06-14 ENCOUNTER — Other Ambulatory Visit: Payer: PRIVATE HEALTH INSURANCE

## 2013-06-14 ENCOUNTER — Ambulatory Visit (INDEPENDENT_AMBULATORY_CARE_PROVIDER_SITE_OTHER): Payer: PRIVATE HEALTH INSURANCE | Admitting: Cardiovascular Disease

## 2013-06-14 VITALS — BP 114/80 | HR 61 | Ht 69.0 in | Wt 209.8 lb

## 2013-06-14 DIAGNOSIS — E785 Hyperlipidemia, unspecified: Secondary | ICD-10-CM

## 2013-06-14 DIAGNOSIS — I2510 Atherosclerotic heart disease of native coronary artery without angina pectoris: Secondary | ICD-10-CM

## 2013-06-14 LAB — HEPATIC FUNCTION PANEL
ALBUMIN: 4 g/dL (ref 3.5–5.2)
ALT: 32 U/L (ref 0–53)
AST: 27 U/L (ref 0–37)
Alkaline Phosphatase: 69 U/L (ref 39–117)
BILIRUBIN DIRECT: 0.1 mg/dL (ref 0.0–0.3)
Total Bilirubin: 0.9 mg/dL (ref 0.3–1.2)
Total Protein: 7.1 g/dL (ref 6.0–8.3)

## 2013-06-14 LAB — BASIC METABOLIC PANEL
BUN: 12 mg/dL (ref 6–23)
CALCIUM: 9.1 mg/dL (ref 8.4–10.5)
CHLORIDE: 104 meq/L (ref 96–112)
CO2: 27 mEq/L (ref 19–32)
CREATININE: 0.9 mg/dL (ref 0.4–1.5)
GFR: 94.61 mL/min (ref >60.00)
Glucose, Bld: 113 mg/dL — ABNORMAL HIGH (ref 70–99)
Potassium: 4.2 mEq/L (ref 3.5–5.1)
Sodium: 137 mEq/L (ref 135–145)

## 2013-06-14 LAB — LIPID PANEL
Cholesterol: 138 mg/dL (ref 0–200)
HDL: 34.5 mg/dL — ABNORMAL LOW (ref >39.00)
LDL CALC: 81 mg/dL (ref 0–99)
TRIGLYCERIDES: 114 mg/dL (ref 0.0–149.0)
Total CHOL/HDL Ratio: 4
VLDL: 22.8 mg/dL (ref 0.0–40.0)

## 2013-06-14 NOTE — Patient Instructions (Signed)
Your physician wants you to follow-up in: 6 MONTHS You will receive a reminder letter in the mail two months in advance. If you don't receive a letter, please call our office to schedule the follow-up appointment.  Your physician recommends that you return for a FASTING lipid profile: 6 MONTHS  Your physician recommends that you continue on your current medications as directed. Please refer to the Current Medication list given to you today.     

## 2013-06-14 NOTE — Progress Notes (Signed)
Chris RutherfordHank E [name] Date of Birth  [birth year] [facility] [address]     [address] [place], [place]  [address] [phone]  Fax  [phone]  Problems: 1. Coronary artery disease- He presented in 2009 with some episodes of chest pain and was found to have an inferior wall myocardial infarction. He underwent successful PTCA and stenting of his mid  right coronary artery using a 2.75 x 28 mm Promus stent. The stent was post dilated using a 3.0 noncompliant balloon.  We then positioned a 3.0 x 15 mm Promus stent in the proximal right coronary artery.  The stent was post dilated using a 3.25 mm noncompliant balloon. 2. Hyperlipidemia 3. Sleep apnea 4. Hypertension  History of Present Illness:  Chris EmeryHank is a 58 year old gentleman. He has a history of coronary artery disease. He presented in 2009 with some episodes of chest pain and was found to have an inferior wall myocardial infarction. He underwent successful PTCA and stenting of his mid  right coronary artery using a 2.75 x 28 mm Promus stent. The stent was post dilated using a 3.0 noncompliant balloon.  We then positioned a 3.0 x 15 mm Promus stent in the proximal right coronary artery.  The stent was post dilated using a 3.25 mm noncompliant balloon.  Pt is doing well.  No angina or dyspnea.    He has not been sleeping well.  He also did not take his meds this am.  April 13, 2012: Chris [name] is doing well from a cardiac standpoint.  Exercising on the treadmill every other day. Complains of constipation.  October 17, 2012:  Thank started having some episodes of chest pain last week.  He had been doing lots of physical activity - played 36 holes of golf a day  for 2 days straight.  He also has been busy doing phyisical work.     He had a stress Myoview study. He walked for 11 minutes on a standard Bruce protocol triple test. A Myoview study revealed an old inferior wall myocardial infarction but was otherwise  unremarkable.   June 14, 2013:  He is doing well .[name]. Has some back pain and "crick" in his neck.  No CP .  Staying active, walking in the evenings 2.5 miles    Current Outpatient Prescriptions on File Prior to Visit  Medication Sig Dispense Refill  . aspirin 81 MG tablet Take 81 mg by mouth daily.        [name]. atorvastatin (LIPITOR) 40 MG tablet TAKE 1 TABLET (40 MG TOTAL) BY MOUTH DAILY.  30 tablet  0  . cetirizine (ZYRTEC) 10 MG tablet Take 1 tablet (10 mg total) by mouth daily.  30 tablet  1  . CIALIS 10 MG tablet TAKE 1 TABLET (10 MG TOTAL) BY MOUTH DAILY AS NEEDED FOR ERECTILE DYSFUNCTION.  20 tablet  1  . clopidogrel (PLAVIX) 75 MG tablet TAKE 1 TABLET BY MOUTH EVERY DAY  30 tablet  0  . esomeprazole (NEXIUM) 40 MG capsule Take 40 mg by mouth daily as needed (acid reflux).      . metoprolol succinate (TOPROL-XL) 25 MG 24 hr tablet TAKE 1/2 TABLET (12.5 MG TOTAL) BY MOUTH 2 (TWO) TIMES DAILY.  30 tablet  0  . Multiple Vitamin (MULTIVITAMIN) tablet Take 1 tablet by mouth daily.      [name]. NEXIUM 40 MG capsule TAKE ONE CAPSULE BY MOUTH BEFORE BREAKFAST DAILY  30 capsule  4  . Omega-3  Fatty Acids (FISH OIL) 1200 MG CAPS Take 2 capsules by mouth 2 (two) times daily at 10 AM and 5 PM.        No current facility-administered medications on file prior to visit.    Allergies  Allergen Reactions  . Ace Inhibitors Rash    REACTION: Rash  . Ropinirole Hcl Rash    Past Medical History  Diagnosis Date  . Acute MI, inferior wall, initial episode of care 2009  . Other and unspecified hyperlipidemia   . Coronary atherosclerosis of unspecified type of vessel, native or graft   . Sleep apnea   . Allergy     Past Surgical History  Procedure Laterality Date  . Cholecystectomy    . Coronary stent placement  2009     2.75 x 28 mm Promus stent to the RCA    History  Smoking status  . Former Smoker  . Quit date: 08/22/2007  Smokeless tobacco  . Not on file    History  Alcohol Use  . Yes     Comment: very little    Family History  Problem Relation Age of Onset  . Heart disease Mother   . Prostate cancer Brother     Reviw of Systems:  Reviewed in the HPI.  All other systems are negative.  Physical Exam: BP 114/80  Pulse 61  Ht 5\' 9"  (1.753 m)  Wt 209 lb 12.8 oz (95.165 kg)  BMI 30.97 kg/m2 The patient is alert and oriented x 3.  The mood and affect are normal.   Skin: warm and dry.  Color is normal.    HEENT:   the sclera are nonicteric.  The mucous membranes are moist.  The carotids are 2+ without bruits.  There is no thyromegaly.  There is no JVD.    Lungs: clear.  The chest wall is non tender.    Heart: regular rate with a normal S1 and S2.  There are no murmurs, gallops, or rubs. The PMI is not displaced.     Abdomen: good bowel sounds.  There is no guarding or rebound.  There is no hepatosplenomegaly or tenderness.  There are no masses.   Extremities:  no clubbing, cyanosis, or edema.  The legs are without rashes.  The distal pulses are intact.   Neuro:  Cranial nerves II - XII are intact.  Motor and sensory functions are intact.    The gait is normal.  EKG:  June 14, 2013:  NSR at 42.  Occasional PACS.  Old inf. MI.    Assessment / Plan:

## 2013-06-14 NOTE — Assessment & Plan Note (Signed)
He is doing well.  No angina.  Exercising regularly.

## 2013-06-14 NOTE — Assessment & Plan Note (Signed)
We'll check fasting labs today. I'll see him again in 6 months for followup office visit, EKG, and fasting labs.

## 2013-06-30 ENCOUNTER — Other Ambulatory Visit: Payer: Self-pay | Admitting: Cardiovascular Disease

## 2013-07-05 ENCOUNTER — Other Ambulatory Visit: Payer: Self-pay | Admitting: *Deleted

## 2013-07-05 ENCOUNTER — Other Ambulatory Visit: Payer: Self-pay | Admitting: Cardiovascular Disease

## 2013-07-05 MED ORDER — PANTOPRAZOLE SODIUM 40 MG PO TBEC: 40.0000 mg | DELAYED_RELEASE_TABLET | Freq: Every day | ORAL | Status: DC

## 2013-07-05 MED ORDER — CLOPIDOGREL BISULFATE 75 MG PO TABS: ORAL_TABLET | ORAL | Status: DC

## 2013-07-05 NOTE — Telephone Encounter (Signed)
Pt was informed the nexium interferes with plavix/ med changed per Dr Elease HashimotoNahser.

## 2013-07-13 ENCOUNTER — Other Ambulatory Visit: Payer: Self-pay | Admitting: Cardiovascular Disease

## 2013-07-26 ENCOUNTER — Ambulatory Visit (INDEPENDENT_AMBULATORY_CARE_PROVIDER_SITE_OTHER): Payer: PRIVATE HEALTH INSURANCE | Admitting: Physician Assistant

## 2013-07-26 VITALS — BP 122/74 | HR 82 | Temp 98.0°F | Resp 18 | Ht 68.5 in | Wt 207.0 lb

## 2013-07-26 DIAGNOSIS — J329 Chronic sinusitis, unspecified: Secondary | ICD-10-CM

## 2013-07-26 DIAGNOSIS — R05 Cough: Secondary | ICD-10-CM

## 2013-07-26 DIAGNOSIS — R059 Cough, unspecified: Secondary | ICD-10-CM

## 2013-07-26 MED ORDER — HYDROCODONE-HOMATROPINE 5-1.5 MG/5ML PO SYRP: 5.0000 mL | ORAL_SOLUTION | Freq: Three times a day (TID) | ORAL | Status: DC | PRN

## 2013-07-26 MED ORDER — AZITHROMYCIN 500 MG PO TABS: 500.0000 mg | ORAL_TABLET | Freq: Every day | ORAL | Status: DC

## 2013-07-26 NOTE — Progress Notes (Signed)
   Subjective:    Patient ID: Chris [name], male    DOB: April 30, 1955, 58 y.o.   MRN: [id number]  HPI 58 year old male presents for evaluation of 5 day history of worsening nasal congestion, PND, cough, and sinus pressure.  Symptoms started suddenly after being exposed to his step-daughter and her son who were both sick.  He took 1 dose of Zyrtec last night and has taken an OTC cold medicine.  Denies fever, chills, nausea, vomiting, otalgia, SOB, wheezing, or chest pain.   Patient is otherwise doing well with no other concerns today.     Review of Systems  Constitutional: Negative for fever and chills.  HENT: Positive for congestion, postnasal drip, rhinorrhea and sinus pressure. Negative for ear pain and sore throat.   Respiratory: Positive for cough. Negative for shortness of breath and wheezing.   Cardiovascular: Negative for chest pain.  Gastrointestinal: Negative for nausea and vomiting.  Neurological: Negative for dizziness and headaches.       Objective:   Physical Exam  Constitutional: He is oriented to person, place, and time. He appears well-developed and well-nourished.  HENT:  Head: Normocephalic and atraumatic.  Right Ear: Hearing, tympanic membrane, external ear and ear canal normal.  Left Ear: Hearing, tympanic membrane, external ear and ear canal normal.  Mouth/Throat: Uvula is midline, oropharynx is clear and moist and mucous membranes are normal.  Eyes: Conjunctivae are normal.  Neck: Normal range of motion. Neck supple.  Cardiovascular: Normal rate, regular rhythm and normal heart sounds.   Pulmonary/Chest: Effort normal and breath sounds normal.  Neurological: He is alert and oriented to person, place, and time.  Psychiatric: He has a normal mood and affect. His behavior is normal. Judgment and thought content normal.          Assessment & Plan:  Sinusitis - Plan: azithromycin (ZITHROMAX) 500 MG tablet  Cough - Plan: HYDROcodone-homatropine (HYCODAN)  5-1.5 MG/5ML syrup  Will treat with zithromax 500 mg x 3 days Hycodan qhs prn cough Continue Zyrtec daily.  Increase fluids and rest Follow up if symptoms worsen or fail to improve.

## 2013-10-06 ENCOUNTER — Other Ambulatory Visit: Payer: Self-pay | Admitting: Nurse Practitioner

## 2013-10-11 ENCOUNTER — Telehealth: Payer: Self-pay | Admitting: Nurse Practitioner

## 2013-10-11 NOTE — Telephone Encounter (Signed)
Called patient and notified him that insurance company will not approve Cialis.  Patient requests to try Viagra.  I advised him that I will discuss with Dr. Elease HashimotoNahser when he returns to the office next week.  Patient verbalized understanding and agreement.

## 2013-10-16 NOTE — Telephone Encounter (Signed)
Will be ok to try viagra 100 mg  PRN

## 2013-10-17 MED ORDER — SILDENAFIL CITRATE 100 MG PO TABS: 100.0000 mg | ORAL_TABLET | Freq: Every day | ORAL | Status: DC | PRN

## 2013-10-17 NOTE — Addendum Note (Signed)
Addended by: Levi AlandSWINYER, MICHELLE M on: 10/17/2013 11:58 AM   Modules accepted: Orders, Medications

## 2013-10-17 NOTE — Telephone Encounter (Signed)
Called patient to notify him that Rx for Viagra has been sent to his pharmacy.  Patient verbalized understanding and agreement.

## 2013-11-15 ENCOUNTER — Other Ambulatory Visit: Payer: Self-pay | Admitting: Family Medicine

## 2013-11-18 ENCOUNTER — Encounter: Payer: Self-pay | Admitting: Cardiology

## 2013-11-28 ENCOUNTER — Other Ambulatory Visit: Payer: Self-pay | Admitting: Cardiovascular Disease

## 2013-12-19 ENCOUNTER — Encounter: Payer: Self-pay | Admitting: Cardiovascular Disease

## 2013-12-19 ENCOUNTER — Ambulatory Visit (INDEPENDENT_AMBULATORY_CARE_PROVIDER_SITE_OTHER): Payer: BC Managed Care – PPO | Admitting: Cardiovascular Disease

## 2013-12-19 VITALS — BP 140/90 | HR 60 | Ht 69.0 in | Wt 212.0 lb

## 2013-12-19 DIAGNOSIS — I2510 Atherosclerotic heart disease of native coronary artery without angina pectoris: Secondary | ICD-10-CM

## 2013-12-19 DIAGNOSIS — E785 Hyperlipidemia, unspecified: Secondary | ICD-10-CM

## 2013-12-19 LAB — HEPATIC FUNCTION PANEL
ALK PHOS: 73 U/L (ref 39–117)
ALT: 34 U/L (ref 0–53)
AST: 35 U/L (ref 0–37)
Albumin: 4.2 g/dL (ref 3.5–5.2)
BILIRUBIN TOTAL: 0.4 mg/dL (ref 0.2–1.2)
Bilirubin, Direct: 0 mg/dL (ref 0.0–0.3)
Total Protein: 7.5 g/dL (ref 6.0–8.3)

## 2013-12-19 LAB — LIPID PANEL
CHOL/HDL RATIO: 4
Cholesterol: 139 mg/dL (ref 0–200)
HDL: 35.3 mg/dL — AB (ref >39.00)
LDL Cholesterol: 72 mg/dL (ref 0–99)
NonHDL: 103.7
Triglycerides: 160 mg/dL — ABNORMAL HIGH (ref 0.0–149.0)
VLDL: 32 mg/dL (ref 0.0–40.0)

## 2013-12-19 LAB — BASIC METABOLIC PANEL
BUN: 9 mg/dL (ref 6–23)
CALCIUM: 9.4 mg/dL (ref 8.4–10.5)
CO2: 28 meq/L (ref 19–32)
Chloride: 103 mEq/L (ref 96–112)
Creatinine, Ser: 1 mg/dL (ref 0.4–1.5)
GFR: 84.41 mL/min (ref >60.00)
GLUCOSE: 120 mg/dL — AB (ref 70–99)
Potassium: 4.3 mEq/L (ref 3.5–5.1)
SODIUM: 138 meq/L (ref 135–145)

## 2013-12-19 MED ORDER — SILDENAFIL CITRATE 20 MG PO TABS: 20.0000 mg | ORAL_TABLET | Freq: Every day | ORAL | Status: DC | PRN

## 2013-12-19 NOTE — Assessment & Plan Note (Signed)
Esker is doing well from a cardiac standpoint. No CP and dyspnea.

## 2013-12-19 NOTE — Progress Notes (Signed)
Chris RutherfordHank E [name] Date of Birth  24-May-1955 [facility] [address]     [address] [place], [place]  [address] [phone]  Fax  [phone]  Problems: 1. Coronary artery disease- He presented in 2009 with some episodes of chest pain and was found to have an inferior wall myocardial infarction. He underwent successful PTCA and stenting of his mid  right coronary artery using a 2.75 x 28 mm Promus stent. The stent was post dilated using a 3.0 noncompliant balloon.  We then positioned a 3.0 x 15 mm Promus stent in the proximal right coronary artery.  The stent was post dilated using a 3.25 mm noncompliant balloon. 2. Hyperlipidemia 3. Sleep apnea 4. Hypertension  History of Present Illness:  Chris EmeryHank is a 58 year old gentleman. He has a history of coronary artery disease. He presented in 2009 with some episodes of chest pain and was found to have an inferior wall myocardial infarction. He underwent successful PTCA and stenting of his mid  right coronary artery using a 2.75 x 28 mm Promus stent. The stent was post dilated using a 3.0 noncompliant balloon.  We then positioned a 3.0 x 15 mm Promus stent in the proximal right coronary artery.  The stent was post dilated using a 3.25 mm noncompliant balloon.  Pt is doing well.  No angina or dyspnea.    He has not been sleeping well.  He also did not take his meds this am.  April 13, 2012: Chris [name] is doing well from a cardiac standpoint.  Exercising on the treadmill every other day. Complains of constipation.  October 17, 2012:  Thank started having some episodes of chest pain last week.  He had been doing lots of physical activity - played 36 holes of golf a day  for 2 days straight.  He also has been busy doing phyisical work.     He had a stress Myoview study. He walked for 11 minutes on a standard Bruce protocol triple test. A Myoview study revealed an old inferior wall myocardial infarction but was otherwise  unremarkable.   June 14, 2013:  He is doing well .[name]. Has some back pain and "crick" in his neck.  No CP .  Staying active, walking in the evenings 2.5 miles   Sept. 29, 2015:  Chris [name] is doing ok.  He is not working at [employer] anymore.   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  Doing well from a cardiac standpoint.  Still walking quite a bit.     Current Outpatient Prescriptions on File Prior to Visit  Medication Sig Dispense Refill  . aspirin 81 MG tablet Take 81 mg by mouth daily.       [name]. atorvastatin (LIPITOR) 40 MG tablet TAKE 1 TABLET BY MOUTH EVERY DAY  30 tablet  5  . azithromycin (ZITHROMAX) 500 MG tablet Take 1 tablet (500 mg total) by mouth daily.  3 tablet  0  . cetirizine (ZYRTEC) 10 MG tablet Take 1 tablet (10 mg total) by mouth daily.  30 tablet  1  . clopidogrel (PLAVIX) 75 MG tablet TAKE 1 TABLET BY MOUTH EVERY DAY  90 tablet  3  . HYDROcodone-homatropine (HYCODAN) 5-1.5 MG/5ML syrup Take 5 mLs by mouth every 8 (eight) hours as needed for cough.  120 mL  0  . metoprolol succinate (TOPROL-XL) 25 MG 24 hr tablet TAKE 1/2 TABLET (12.5 MG TOTAL) BY MOUTH 2 (TWO) TIMES DAILY.  30  tablet  0  . Multiple Vitamin (MULTIVITAMIN) tablet Take 1 tablet by mouth daily.      . Omega-3 Fatty Acids (FISH OIL) 1200 MG CAPS Take 2 capsules by mouth 2 (two) times daily at 10 AM and 5 PM.       . sildenafil (VIAGRA) 100 MG tablet Take 1 tablet (100 mg total) by mouth daily as needed for erectile dysfunction.  30 tablet  4   No current facility-administered medications on file prior to visit.    Allergies  Allergen Reactions  . Ace Inhibitors Rash    REACTION: Rash  . Ropinirole Hcl Rash    Past Medical History  Diagnosis Date  . Acute MI, inferior wall, initial episode of care 2009  . Other and unspecified hyperlipidemia   . Coronary atherosclerosis of unspecified type of vessel, native or graft   . Sleep apnea   . Allergy     Past Surgical History   Procedure Laterality Date  . Cholecystectomy    . Coronary stent placement  2009     2.75 x 28 mm Promus stent to the RCA    History  Smoking status  . Former Smoker  . Quit date: 08/22/2007  Smokeless tobacco  . Not on file    History  Alcohol Use  . Yes    Comment: very little    Family History  Problem Relation Age of Onset  . Heart disease Mother   . Prostate cancer Brother     Reviw of Systems:  Reviewed in the HPI.  All other systems are negative.  Physical Exam: BP 140/90  Pulse 60  Ht 5\' 9"  (1.753 m)  Wt 212 lb (96.163 kg)  BMI 31.29 kg/m2 The patient is alert and oriented x 3.  The mood and affect are normal.   Skin: warm and dry.  Color is normal.    HEENT:   the sclera are nonicteric.  The mucous membranes are moist.  The carotids are 2+ without bruits.  There is no thyromegaly.  There is no JVD.    Lungs: clear.  The chest wall is non tender.    Heart: regular rate with a normal S1 and S2.  There are no murmurs, gallops, or rubs. The PMI is not displaced.     Abdomen: good bowel sounds.  There is no guarding or rebound.  There is no hepatosplenomegaly or tenderness.  There are no masses.   Extremities:  no clubbing, cyanosis, or edema.  The legs are without rashes.  The distal pulses are intact.   Neuro:  Cranial nerves II - XII are intact.  Motor and sensory functions are intact.    The gait is normal.  EKG:  June 14, 2013:  NSR at 30.  Occasional PACS.  Old inf. MI.    Assessment / Plan:

## 2013-12-19 NOTE — Assessment & Plan Note (Signed)
Lipids were OK in March.  Recheck lipids , liver, BMP in 6 months at next OV.

## 2013-12-19 NOTE — Patient Instructions (Signed)
Your physician recommends that you continue on your current medications as directed. Please refer to the Current Medication list given to you today.  Your physician recommends that you have lab work:  TODAY  Your physician wants you to follow-up in: 6 months with Dr. Elease HashimotoNahser.  You will receive a reminder letter in the mail two months in advance. If you don't receive a letter, please call our office to schedule the follow-up appointment. Your physician recommends that you return for lab work in: 6 months on the day of or a few days before your office visit with Dr. Elease HashimotoNahser.  You will need to FAST for this appointment - nothing to eat or drink after midnight the night before except water.

## 2013-12-25 ENCOUNTER — Other Ambulatory Visit: Payer: Self-pay | Admitting: Cardiovascular Disease

## 2014-01-10 ENCOUNTER — Other Ambulatory Visit: Payer: Self-pay | Admitting: Cardiovascular Disease

## 2014-04-16 ENCOUNTER — Encounter: Payer: Self-pay | Admitting: Family Medicine

## 2014-05-21 ENCOUNTER — Encounter: Payer: Self-pay | Admitting: Family Medicine

## 2014-05-21 ENCOUNTER — Ambulatory Visit (INDEPENDENT_AMBULATORY_CARE_PROVIDER_SITE_OTHER): Payer: BLUE CROSS/BLUE SHIELD | Admitting: Family Medicine

## 2014-05-21 VITALS — BP 146/74 | HR 57 | Temp 97.4°F | Resp 16 | Ht 69.0 in | Wt 214.0 lb

## 2014-05-21 DIAGNOSIS — Z125 Encounter for screening for malignant neoplasm of prostate: Secondary | ICD-10-CM

## 2014-05-21 DIAGNOSIS — I252 Old myocardial infarction: Secondary | ICD-10-CM | POA: Insufficient documentation

## 2014-05-21 DIAGNOSIS — Z0000 Encounter for general adult medical examination without abnormal findings: Secondary | ICD-10-CM

## 2014-05-21 DIAGNOSIS — Z1159 Encounter for screening for other viral diseases: Secondary | ICD-10-CM

## 2014-05-21 DIAGNOSIS — Z131 Encounter for screening for diabetes mellitus: Secondary | ICD-10-CM

## 2014-05-21 DIAGNOSIS — Z114 Encounter for screening for human immunodeficiency virus [HIV]: Secondary | ICD-10-CM

## 2014-05-21 DIAGNOSIS — E663 Overweight: Secondary | ICD-10-CM | POA: Insufficient documentation

## 2014-05-21 DIAGNOSIS — Z130 Encounter for screening for diseases of the blood and blood-forming organs and certain disorders involving the immune mechanism: Secondary | ICD-10-CM

## 2014-05-21 LAB — HEPATITIS C ANTIBODY: HCV Ab: NEGATIVE

## 2014-05-21 LAB — CBC
HCT: 43.3 % (ref 39.0–52.0)
Hemoglobin: 14.5 g/dL (ref 13.0–17.0)
MCH: 29.6 pg (ref 26.0–34.0)
MCHC: 33.5 g/dL (ref 30.0–36.0)
MCV: 88.4 fL (ref 78.0–100.0)
MPV: 9 fL (ref 8.6–12.4)
PLATELETS: 318 10*3/uL (ref 150–400)
RBC: 4.9 MIL/uL (ref 4.22–5.81)
RDW: 12.9 % (ref 11.5–15.5)
WBC: 5.7 10*3/uL (ref 4.0–10.5)

## 2014-05-21 LAB — HEMOGLOBIN A1C
Hgb A1c MFr Bld: 6.4 % — ABNORMAL HIGH (ref <5.7)
Mean Plasma Glucose: 137 mg/dL — ABNORMAL HIGH (ref <117)

## 2014-05-21 LAB — HIV ANTIBODY (ROUTINE TESTING W REFLEX): HIV: NONREACTIVE

## 2014-05-21 NOTE — Patient Instructions (Addendum)
Good to see you today- I will be in touch with your labs asap by mail.   Continue to work on [employer] diet and exercise- work towards a goal weight of 195-200 lbs.  Cutting out soda will help here.   Keep an eye on your blood pressure at home You have a small bunion on your left foot.  There is nothing you can do about this except watch your weight and stay active.  If it starts to bother you a lot we can think about surgery but hopefully this will not be necessary.  Please come and see me in about 6 months to check on your weight

## 2014-05-21 NOTE — Progress Notes (Signed)
[facility] [address] [place] [address] [phone]  Date:  05/21/2014   Name:  Chris [name]   DOB:  04-15-1955   MRN:  [id number]  PCP:  Shade Flood, MD    Chief Complaint: Annual Exam   History of Present Illness:  Chris [name] is a 60 y.o. very pleasant male patient who presents with the following:  Here today for a CPE.  History of MI, Dr. [name] is his cardiologist.  Last labs in September of 2015.  Noted borderline glucose recently and will do A1c today Flu shot done this year at drug store.  Colonoscopy is UTD.   Last tetanus shot: 2007 He notes some pain in his right foot at the 1st MTP.  This has been present for a few years but is not yet at the point that he would consider surgery or other intervention.  He knows that he needs to lose weight  BP Readings from Last 3 Encounters:  05/21/14 146/74  12/19/13 140/90  07/26/13 122/74   Wt Readings from Last 3 Encounters:  05/21/14 214 lb (97.07 kg)  12/19/13 212 lb (96.163 kg)  07/26/13 207 lb (93.895 kg)     Patient Active Problem List   Diagnosis Date Noted  . Unstable angina 10/10/2012  . ED (erectile dysfunction) 07/25/2012  . RESTLESS LEGS SYNDROME 02/20/2010  . HYPERLIPIDEMIA 02/19/2010  . OBSTRUCTIVE SLEEP APNEA 02/19/2010  . CAD 02/19/2010    Past Medical History  Diagnosis Date  . Acute MI, inferior wall, initial episode of care 2009  . Other and unspecified hyperlipidemia   . Coronary atherosclerosis of unspecified type of vessel, native or graft   . Sleep apnea   . Allergy     Past Surgical History  Procedure Laterality Date  . Cholecystectomy    . Coronary stent placement  2009     2.75 x 28 mm Promus stent to the RCA    History  Substance Use Topics  . Smoking status: Former Smoker    Quit date: 08/22/2007  . Smokeless tobacco: Not on file  . Alcohol Use: Yes     Comment: very little    Family History  Problem Relation Age of Onset  .  Heart disease Mother   . Prostate cancer Brother     Allergies  Allergen Reactions  . Ace Inhibitors Rash    REACTION: Rash  . Ropinirole Hcl Rash    Medication list has been reviewed and updated.  Current Outpatient Prescriptions on File Prior to Visit  Medication Sig Dispense Refill  . aspirin 81 MG tablet Take 81 mg by mouth daily.     [name] atorvastatin (LIPITOR) 40 MG tablet TAKE 1 TABLET BY MOUTH EVERY DAY 30 tablet 5  . azithromycin (ZITHROMAX) 500 MG tablet Take 1 tablet (500 mg total) by mouth daily. 3 tablet 0  . cetirizine (ZYRTEC) 10 MG tablet Take 1 tablet (10 mg total) by mouth daily. 30 tablet 1  . clopidogrel (PLAVIX) 75 MG tablet TAKE 1 TABLET BY MOUTH EVERY DAY 90 tablet 3  . HYDROcodone-homatropine (HYCODAN) 5-1.5 MG/5ML syrup Take 5 mLs by mouth every 8 (eight) hours as needed for cough. 120 mL 0  . metoprolol succinate (TOPROL-XL) 25 MG 24 hr tablet TAKE 1/2 TABLET (12.5 MG TOTAL) BY MOUTH 2 (TWO) TIMES DAILY. 30 tablet 6  . Multiple Vitamin (MULTIVITAMIN) tablet Take 1 tablet by mouth daily.    . Omega-3 Fatty Acids (FISH  OIL) 1200 MG CAPS Take 2 capsules by mouth 2 (two) times daily at 10 AM and 5 PM.     . sildenafil (REVATIO) 20 MG tablet Take 1 tablet (20 mg total) by mouth daily as needed. 50 tablet 3   No current facility-administered medications on file prior to visit.    Review of Systems:  As per HPI- otherwise negative.    Physical Examination: Filed Vitals:   05/21/14 0825  BP: 146/74  Pulse: 57  Temp: 97.4 F (36.3 C)  Resp: 16   Filed Vitals:   05/21/14 0825  Height: 5\' 9"  (1.753 m)  Weight: 214 lb (97.07 kg)   Body mass index is 31.59 kg/(m^2). Ideal Body Weight: Weight in (lb) to have BMI = 25: 168.9  GEN: WDWN, NAD, Non-toxic, A & O x 3, overweight HEENT: Atraumatic, Normocephalic. Neck supple. No masses, No LAD.  Bilateral TM wnl, oropharynx normal.  PEERL,EOMI.   Ears and Nose: No external deformity. CV: RRR, No M/G/R. No JVD.  No thrill. No extra heart sounds. PULM: CTA B, no wheezes, crackles, rhonchi. No retractions. No resp. distress. No accessory muscle use. ABD: S, NT, ND, +BS. No rebound. No HSM. EXTR: No c/c/e NEURO Normal gait.  PSYCH: Normally interactive. Conversant. Not depressed or anxious appearing.  Calm demeanor.  GU: normal scrotum, testes and prostate Slight bunion left foot.  OW foot exam is normal  Assessment and Plan: Physical exam  Screening for prostate cancer - Plan: PSA  Screening for HIV (human immunodeficiency virus) - Plan: HIV antibody  Need for hepatitis C screening test - Plan: Hepatitis C antibody  Screening for diabetes mellitus - Plan: Hemoglobin A1c  Screening for deficiency anemia - Plan: CBC  Overweight  History of MI (myocardial infarction)  See patient instructions for more details.   Follow-up with labs Continue to see cardiology Follow-up in 6 months    Signed Abbe AmsterdamJessica Carolyn Sylvia, MD

## 2014-05-22 LAB — PSA: PSA: 0.83 ng/mL (ref <=4.00)

## 2014-06-22 ENCOUNTER — Other Ambulatory Visit: Payer: Self-pay | Admitting: Cardiovascular Disease

## 2014-07-11 ENCOUNTER — Other Ambulatory Visit: Payer: Self-pay | Admitting: Cardiovascular Disease

## 2014-07-23 ENCOUNTER — Other Ambulatory Visit: Payer: Self-pay | Admitting: Cardiovascular Disease

## 2014-08-16 ENCOUNTER — Ambulatory Visit (INDEPENDENT_AMBULATORY_CARE_PROVIDER_SITE_OTHER): Payer: BLUE CROSS/BLUE SHIELD

## 2014-08-16 ENCOUNTER — Ambulatory Visit (INDEPENDENT_AMBULATORY_CARE_PROVIDER_SITE_OTHER): Payer: BLUE CROSS/BLUE SHIELD | Admitting: Family Medicine

## 2014-08-16 VITALS — BP 128/84 | HR 61 | Temp 98.5°F | Resp 18 | Ht 70.0 in | Wt 203.0 lb

## 2014-08-16 DIAGNOSIS — M10072 Idiopathic gout, left ankle and foot: Secondary | ICD-10-CM

## 2014-08-16 DIAGNOSIS — M79672 Pain in left foot: Secondary | ICD-10-CM | POA: Diagnosis not present

## 2014-08-16 MED ORDER — COLCHICINE 0.6 MG PO TABS: ORAL_TABLET | ORAL | Status: DC

## 2014-08-16 MED ORDER — TRAMADOL HCL 50 MG PO TABS: 50.0000 mg | ORAL_TABLET | Freq: Three times a day (TID) | ORAL | Status: DC | PRN

## 2014-08-16 NOTE — Progress Notes (Signed)
[facility] [address], [place] [id number] [phone]  Date:  08/16/2014   Name:  Chris RutherfordHank E Lotts   DOB:  06-30-1955   MRN:  [id number]  PCP:  Shade FloodGREENE,JEFFREY R, MD    Chief Complaint: Foot Pain   History of Present Illness:  Chris [name] is a 59 y.o. very pleasant male patient who presents with the following:  He notes swelling and pain at the left 1st MT.  He has noted this off an on for years.  It may occur every few months He will usually stay off it and rest it, and it feels better with rest and ice.   Most recent flare about one week ago.  It has not wanted to settle down this time and still bothers him just to walk on it.  It does not hurt to touch the toe   Wt Readings from Last 3 Encounters:  08/16/14 203 lb (92.08 kg)  05/21/14 214 lb (97.07 kg)  12/19/13 212 lb (96.163 kg)     Patient Active Problem List   Diagnosis Date Noted  . Overweight 05/21/2014  . History of MI (myocardial infarction) 05/21/2014  . Unstable angina 10/10/2012  . ED (erectile dysfunction) 07/25/2012  . RESTLESS LEGS SYNDROME 02/20/2010  . HYPERLIPIDEMIA 02/19/2010  . OBSTRUCTIVE SLEEP APNEA 02/19/2010  . CAD 02/19/2010    Past Medical History  Diagnosis Date  . Acute MI, inferior wall, initial episode of care 2009  . Other and unspecified hyperlipidemia   . Coronary atherosclerosis of unspecified type of vessel, native or graft   . Sleep apnea   . Allergy     Past Surgical History  Procedure Laterality Date  . Cholecystectomy    . Coronary stent placement  2009     2.75 x 28 mm Promus stent to the RCA    History  Substance Use Topics  . Smoking status: Former Smoker    Quit date: 08/22/2007  . Smokeless tobacco: Not on file  . Alcohol Use: Yes     Comment: very little    Family History  Problem Relation Age of Onset  . Heart disease Mother   . Prostate cancer Brother     Allergies  Allergen Reactions  . Ace Inhibitors Rash   REACTION: Rash  . Ropinirole Hcl Rash    Medication list has been reviewed and updated.  Current Outpatient Prescriptions on File Prior to Visit  Medication Sig Dispense Refill  . aspirin 81 MG tablet Take 81 mg by mouth daily.     [name]. atorvastatin (LIPITOR) 40 MG tablet TAKE 1 TABLET BY MOUTH EVERY DAY 30 tablet 5  . clopidogrel (PLAVIX) 75 MG tablet TAKE 1 TABLET BY MOUTH EVERY DAY 30 tablet 3  . esomeprazole (NEXIUM) 40 MG capsule Take 40 mg by mouth daily at 12 noon.    . metoprolol succinate (TOPROL-XL) 25 MG 24 hr tablet TAKE 1/2 TABLET (12.5 MG TOTAL) BY MOUTH 2 (TWO) TIMES DAILY. 30 tablet 0  . Multiple Vitamin (MULTIVITAMIN) tablet Take 1 tablet by mouth daily.    . Omega-3 Fatty Acids (FISH OIL) 1200 MG CAPS Take 2 capsules by mouth 2 (two) times daily at 10 AM and 5 PM.     . pantoprazole (PROTONIX) 40 MG tablet TAKE 1 TABLET (40 MG TOTAL) BY MOUTH DAILY. 30 tablet 2  . tadalafil (CIALIS) 10 MG tablet Take 10 mg by mouth daily as needed for erectile dysfunction.  No current facility-administered medications on file prior to visit.    Review of Systems:  As per HPI- otherwise negative.   Physical Examination: Filed Vitals:   08/16/14 1020  BP: 128/84  Pulse: 61  Temp: 98.5 F (36.9 C)  Resp: 18   Filed Vitals:   08/16/14 1020  Height:  (1.778 m)  Weight: 203 lb (92.08 kg)   Body mass index is 29.13 kg/(m^2). Ideal Body Weight: Weight in (lb) to have BMI = 25: 173.9   GEN: WDWN, NAD, Non-toxic, Alert & Oriented x 3, overweight but has lost though cutting out sweet drinks HEENT: Atraumatic, Normocephalic.  Ears and Nose: No external deformity. EXTR: No clubbing/cyanosis/edema NEURO: Normal gait.  PSYCH: Normally interactive. Conversant. Not depressed or anxious appearing.  Calm demeanor.  Left foot:  No tenderness at this time. However he indicates the first MCP as the area of concern where he will have pain.  The area is slightly swollen compared to  the right foot.   Ankle negative No tenderness at plantar fascia insertion or with squeezing the MTs  [facility] reading (PRIMARY) by  Dr. Patsy Lager. Left foot: negative  LEFT FOOT - COMPLETE 3+ VIEW  COMPARISON: None.  FINDINGS: Left first metatarsophalangeal degenerative change. No evidence fracture or dislocation. No acute abnormality.  IMPRESSION: First MTP degenerative change. No acute abnormality.  Assessment and Plan: Idiopathic gout of left foot, unspecified chronicity - Plan: colchicine 0.6 MG tablet, DISCONTINUED: traMADol (ULTRAM) 50 MG tablet  Pain in left foot - Plan: DG Foot Complete Left, colchicine 0.6 MG tablet, DISCONTINUED: traMADol (ULTRAM) 50 MG tablet  Suspect gout as the cause of his recurrent left 1st MTP pain.  Treat with colchicine as needed for flare.  Did NOT give tramadol as he did not want any medication for pain Asked him to let me know if not better with colchicine Note for work so he can stay off his foot for a couple of days   Signed Abbe Amsterdam, MD

## 2014-08-16 NOTE — Patient Instructions (Signed)
We are going to try treating you for gout with colchicine. Take it according to the directions as needed when your toe hurts Let me know if this does not help you You can also use tylenol and rest/ ice for your foot  On the days that you use the colchicine do not take the atorvastatin if you like- this combination may increase your risk of muscle damage.

## 2014-08-21 ENCOUNTER — Other Ambulatory Visit: Payer: Self-pay | Admitting: Cardiovascular Disease

## 2014-09-18 ENCOUNTER — Other Ambulatory Visit: Payer: Self-pay | Admitting: Cardiovascular Disease

## 2014-10-17 ENCOUNTER — Other Ambulatory Visit: Payer: Self-pay | Admitting: Cardiovascular Disease

## 2014-11-19 ENCOUNTER — Ambulatory Visit (INDEPENDENT_AMBULATORY_CARE_PROVIDER_SITE_OTHER): Payer: BLUE CROSS/BLUE SHIELD | Admitting: Family Medicine

## 2014-11-19 ENCOUNTER — Other Ambulatory Visit: Payer: Self-pay | Admitting: Cardiovascular Disease

## 2014-11-19 ENCOUNTER — Encounter: Payer: Self-pay | Admitting: Family Medicine

## 2014-11-19 ENCOUNTER — Encounter: Payer: Self-pay | Admitting: Cardiovascular Disease

## 2014-11-19 ENCOUNTER — Ambulatory Visit (INDEPENDENT_AMBULATORY_CARE_PROVIDER_SITE_OTHER): Payer: BLUE CROSS/BLUE SHIELD | Admitting: Cardiovascular Disease

## 2014-11-19 VITALS — BP 150/98 | HR 58 | Temp 97.7°F | Resp 16 | Ht 69.0 in | Wt 207.0 lb

## 2014-11-19 VITALS — BP 122/72 | HR 58 | Ht 69.0 in | Wt 205.8 lb

## 2014-11-19 DIAGNOSIS — Z13220 Encounter for screening for lipoid disorders: Secondary | ICD-10-CM

## 2014-11-19 DIAGNOSIS — Z130 Encounter for screening for diseases of the blood and blood-forming organs and certain disorders involving the immune mechanism: Secondary | ICD-10-CM

## 2014-11-19 DIAGNOSIS — I2510 Atherosclerotic heart disease of native coronary artery without angina pectoris: Secondary | ICD-10-CM

## 2014-11-19 DIAGNOSIS — I10 Essential (primary) hypertension: Secondary | ICD-10-CM

## 2014-11-19 DIAGNOSIS — Z131 Encounter for screening for diabetes mellitus: Secondary | ICD-10-CM | POA: Diagnosis not present

## 2014-11-19 DIAGNOSIS — E785 Hyperlipidemia, unspecified: Secondary | ICD-10-CM | POA: Diagnosis not present

## 2014-11-19 LAB — HEMOGLOBIN A1C
Hgb A1c MFr Bld: 6.1 % — ABNORMAL HIGH (ref <5.7)
Mean Plasma Glucose: 128 mg/dL — ABNORMAL HIGH (ref <117)

## 2014-11-19 LAB — COMPREHENSIVE METABOLIC PANEL
ALT: 26 U/L (ref 9–46)
AST: 22 U/L (ref 10–35)
Albumin: 4 g/dL (ref 3.6–5.1)
Alkaline Phosphatase: 67 U/L (ref 40–115)
BILIRUBIN TOTAL: 0.6 mg/dL (ref 0.2–1.2)
BUN: 10 mg/dL (ref 7–25)
CALCIUM: 9.1 mg/dL (ref 8.6–10.3)
CHLORIDE: 103 mmol/L (ref 98–110)
CO2: 26 mmol/L (ref 20–31)
CREATININE: 0.91 mg/dL (ref 0.70–1.33)
Glucose, Bld: 105 mg/dL — ABNORMAL HIGH (ref 65–99)
Potassium: 4.9 mmol/L (ref 3.5–5.3)
Sodium: 137 mmol/L (ref 135–146)
Total Protein: 6.4 g/dL (ref 6.1–8.1)

## 2014-11-19 LAB — LIPID PANEL
Cholesterol: 108 mg/dL — ABNORMAL LOW (ref 125–200)
HDL: 32 mg/dL — AB (ref >=40)
LDL CALC: 33 mg/dL (ref <130)
Total CHOL/HDL Ratio: 3.4 Ratio (ref <=5.0)
Triglycerides: 215 mg/dL — ABNORMAL HIGH (ref <150)
VLDL: 43 mg/dL — AB (ref <30)

## 2014-11-19 LAB — CBC
HCT: 39.9 % (ref 39.0–52.0)
Hemoglobin: 13.4 g/dL (ref 13.0–17.0)
MCH: 29.5 pg (ref 26.0–34.0)
MCHC: 33.6 g/dL (ref 30.0–36.0)
MCV: 87.7 fL (ref 78.0–100.0)
MPV: 9.4 fL (ref 8.6–12.4)
Platelets: 295 10*3/uL (ref 150–400)
RBC: 4.55 MIL/uL (ref 4.22–5.81)
RDW: 13.4 % (ref 11.5–15.5)
WBC: 6.6 10*3/uL (ref 4.0–10.5)

## 2014-11-19 NOTE — Progress Notes (Signed)
Chris [name] Date of Birth  1956/02/18 [facility] [address]     [address] [place], [place]  [address] [phone]  Fax  [phone]  Problems: 1. Coronary artery disease- He presented in 2009 with some episodes of chest pain and was found to have an inferior wall myocardial infarction. He underwent successful PTCA and stenting of his mid  right coronary artery using a 2.75 x 28 mm Promus stent. The stent was post dilated using a 3.0 noncompliant balloon.  We then positioned a 3.0 x 15 mm Promus stent in the proximal right coronary artery.  The stent was post dilated using a 3.25 mm noncompliant balloon. 2. Hyperlipidemia 3. Sleep apnea 4. Hypertension  History of Present Illness:  Chris [name] is a 59 year old gentleman. He has a history of coronary artery disease. He presented in 2009 with some episodes of chest pain and was found to have an inferior wall myocardial infarction. He underwent successful PTCA and stenting of his mid  right coronary artery using a 2.75 x 28 mm Promus stent. The stent was post dilated using a 3.0 noncompliant balloon.  We then positioned a 3.0 x 15 mm Promus stent in the proximal right coronary artery.  The stent was post dilated using a 3.25 mm noncompliant balloon.  Pt is doing well.  No angina or dyspnea.    He has not been sleeping well.  He also did not take his meds this am.  April 13, 2012: Chris [name] is doing well from a cardiac standpoint.  Exercising on the treadmill every other day. Complains of constipation.  October 17, 2012:  Thank started having some episodes of chest pain last week.  He had been doing lots of physical activity - played 36 holes of golf a day  for 2 days straight.  He also has been busy doing phyisical work.     He had a stress Myoview study. He walked for 11 minutes on a standard Bruce protocol triple test. A Myoview study revealed an old inferior wall myocardial infarction but was otherwise  unremarkable.   June 14, 2013:  He is doing well .[name] Has some back pain and "crick" in his neck.  No CP .  Staying active, walking in the evenings 2.5 miles   Sept. 29, 2015:  Chris [name] is doing ok.  He is not working at [employer].   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  Doing well from a cardiac standpoint.  Still walking quite a bit.    Aug. 29, 2016:  Doing well. Owns his own business.  Chris [name]  No CP , no dyspnea.      Current Outpatient Prescriptions on File Prior to Visit  Medication Sig Dispense Refill  . aspirin 81 MG tablet Take 81 mg by mouth daily.     [name] atorvastatin (LIPITOR) 40 MG tablet TAKE 1 TABLET BY MOUTH EVERY DAY 30 tablet 5  . clopidogrel (PLAVIX) 75 MG tablet TAKE 1 TABLET BY MOUTH EVERY DAY 30 tablet 6  . metoprolol succinate (TOPROL-XL) 25 MG 24 hr tablet TAKE 1/2 TABLET BY MOUTH 2 TIMES DAILY 30 tablet 1  . Multiple Vitamin (MULTIVITAMIN) tablet Take 1 tablet by mouth daily.    . Omega-3 Fatty Acids (FISH OIL) 1200 MG CAPS Take 2 capsules by mouth 2 (two) times daily at 10 AM and 5 PM.      No current facility-administered medications on file prior to visit.  Allergies  Allergen Reactions  . Ace Inhibitors Rash    REACTION: Rash  . Ropinirole Hcl Rash    Past Medical History  Diagnosis Date  . Acute MI, inferior wall, initial episode of care 2009  . Other and unspecified hyperlipidemia   . Coronary atherosclerosis of unspecified type of vessel, native or graft   . Sleep apnea   . Allergy     Past Surgical History  Procedure Laterality Date  . Cholecystectomy    . Coronary stent placement  2009     2.75 x 28 mm Promus stent to the RCA    History  Smoking status  . Former Smoker  . Quit date: 08/22/2007  Smokeless tobacco  . Not on file    History  Alcohol Use  . Yes    Comment: very little    Family History  Problem Relation Age of Onset  . Heart disease Mother   . Prostate  cancer Brother     Reviw of Systems:  Reviewed in the HPI.  All other systems are negative.  Physical Exam: BP 122/72 mmHg  Pulse 58  Ht 5\' 9"  (1.753 m)  Wt 93.35 kg (205 lb 12.8 oz)  BMI 30.38 kg/m2 The patient is alert and oriented x 3.  The mood and affect are normal.   Skin: warm and dry.  Color is normal.    HEENT:   the sclera are nonicteric.  The mucous membranes are moist.  The carotids are 2+ without bruits.  There is no thyromegaly.  There is no JVD.    Lungs: clear.  The chest wall is non tender.    Heart: regular rate with a normal S1 and S2.  There are no murmurs, gallops, or rubs. The PMI is not displaced.     Abdomen: good bowel sounds.  There is no guarding or rebound.  There is no hepatosplenomegaly or tenderness.  There are no masses.   Extremities:  no clubbing, cyanosis, or edema.  The legs are without rashes.  The distal pulses are intact.   Neuro:  Cranial nerves II - XII are intact.  Motor and sensory functions are intact.    The gait is normal.  EKG:   11/19/2014: Sinus bradycardia 58. He has an incomplete right bundle branch block. This old inferior wall myocardial infarction. EKG is unchanged from previous tracings.  Assessment / Plan:   .1. Coronary artery disease-   he's doing well. Is not having any episodes of angina.  2. Hyperlipidemia-   His triglyceride levels remained little elevated. He's not been as active because of a foot injury. I've encouraged him to exercise on a regular basis.  3. Sleep apnea  4. Hypertension -  His blood pressure is normal.    Roseana Rhine, Deloris Ping, MD  11/19/2014 3:39 PM    [facility] [address] [place],  [address] [place], [place]  [address] Pager [phone] Phone: [phone]; Fax: [phone]   [facility]  [address] [place], [place]  [address] [phone]   Fax [phone]

## 2014-11-19 NOTE — Progress Notes (Signed)
[facility] [address], [place] [address] [phone]  Date:  11/19/2014   Name:  Chris [name]   DOB:  01/03/1956   MRN:  [id number]  PCP:  Shade Flood, MD    Chief Complaint: Hypertension and Follow-up   History of Present Illness:  Chris [name] is a 59 y.o. very pleasant male patient who presents with the following:  Here today for a recheck visit.   He plans to get a flu shot a little later in the season History of MI 2009, ED, HTN, OSA, obesity, gout.    Last cholesterol test about one year ago.    Last seen for gout in May- he still has some persistent swelling in his left 1st MTP- however it only hurts on occasion He did have an x-ray of his foot last year and was noted to have OA  He had some oatmeal this am, nothing else so far.   He took his BP meds this am- but did take 2 viagra last night as well, wonders if this is why his BP is a little high  He will see Dr. Melburn Popper this afternoon  He is unsure of last tetanus shot. I will pull paper chart and look for it.    Former smoker  BP Readings from Last 3 Encounters:  11/19/14 158/89  08/16/14 128/84  05/21/14 146/74     Patient Active Problem List   Diagnosis Date Noted  . Overweight 05/21/2014  . History of MI (myocardial infarction) 05/21/2014  . Unstable angina 10/10/2012  . ED (erectile dysfunction) 07/25/2012  . RESTLESS LEGS SYNDROME 02/20/2010  . HYPERLIPIDEMIA 02/19/2010  . OBSTRUCTIVE SLEEP APNEA 02/19/2010  . CAD 02/19/2010    Past Medical History  Diagnosis Date  . Acute MI, inferior wall, initial episode of care 2009  . Other and unspecified hyperlipidemia   . Coronary atherosclerosis of unspecified type of vessel, native or graft   . Sleep apnea   . Allergy     Past Surgical History  Procedure Laterality Date  . Cholecystectomy    . Coronary stent placement  2009     2.75 x 28 mm Promus stent to the RCA    Social History  Substance Use Topics   . Smoking status: Former Smoker    Quit date: 08/22/2007  . Smokeless tobacco: None  . Alcohol Use: Yes     Comment: very little    Family History  Problem Relation Age of Onset  . Heart disease Mother   . Prostate cancer Brother     Allergies  Allergen Reactions  . Ace Inhibitors Rash    REACTION: Rash  . Ropinirole Hcl Rash    Medication list has been reviewed and updated.  Current Outpatient Prescriptions on File Prior to Visit  Medication Sig Dispense Refill  . aspirin 81 MG tablet Take 81 mg by mouth daily.     [name] atorvastatin (LIPITOR) 40 MG tablet TAKE 1 TABLET BY MOUTH EVERY DAY 30 tablet 5  . clopidogrel (PLAVIX) 75 MG tablet TAKE 1 TABLET BY MOUTH EVERY DAY 30 tablet 6  . colchicine 0.6 MG tablet Take 2 pills, then 1 pill an hour later.  May repeat every 3 days as needed for gout flare. 30 tablet 0  . esomeprazole (NEXIUM) 40 MG capsule Take 40 mg by mouth daily at 12 noon.    . metoprolol succinate (TOPROL-XL) 25 MG 24 hr tablet TAKE 1/2 TABLET BY MOUTH  2 TIMES DAILY 30 tablet 1  . Multiple Vitamin (MULTIVITAMIN) tablet Take 1 tablet by mouth daily.    . Omega-3 Fatty Acids (FISH OIL) 1200 MG CAPS Take 2 capsules by mouth 2 (two) times daily at 10 AM and 5 PM.     . pantoprazole (PROTONIX) 40 MG tablet TAKE 1 TABLET (40 MG TOTAL) BY MOUTH DAILY. 30 tablet 2  . tadalafil (CIALIS) 10 MG tablet Take 10 mg by mouth daily as needed for erectile dysfunction.     No current facility-administered medications on file prior to visit.    Review of Systems:  As per HPI- otherwise negative.   Physical Examination: Filed Vitals:   11/19/14 0816  BP: 158/89  Pulse: 58  Temp: 97.7 F (36.5 C)  Resp: 16   Filed Vitals:   11/19/14 0816  Height: 5\' 9"  (1.753 m)  Weight: 207 lb (93.895 kg)   Body mass index is 30.55 kg/(m^2). Ideal Body Weight: Weight in (lb) to have BMI = 25: 168.9  GEN: WDWN, NAD, Non-toxic, A & O x 3, overweight, looks well HEENT: Atraumatic,  Normocephalic. Neck supple. No masses, No LAD.  Bilateral TM wnl, oropharynx normal.  PEERL,EOMI.   Ears and Nose: No external deformity. CV: RRR, No M/G/R. No JVD. No thrill. No extra heart sounds. PULM: CTA B, no wheezes, crackles, rhonchi. No retractions. No resp. distress. No accessory muscle use. EXTR: No c/c/e NEURO Normal gait.  PSYCH: Normally interactive. Conversant. Not depressed or anxious appearing.  Calm demeanor.  Minimal thickening of the left 1st MTP joint, but no heat, redness, or tenderness with ROM   Assessment and Plan: Essential hypertension  Screening for hyperlipidemia - Plan: Lipid panel  Screening for deficiency anemia - Plan: CBC  Screening for diabetes mellitus - Plan: Comprehensive metabolic panel, Hemoglobin A1c  Labs pending as above BP is a little high today- he is seeing his cardiologist this afternoon.  Last BP have been acceptable  BP Readings from Last 3 Encounters:  11/19/14 150/98  08/16/14 128/84  05/21/14 146/74     Signed Abbe Amsterdam, MD

## 2014-11-19 NOTE — Patient Instructions (Signed)
Medication Instructions:  Your physician recommends that you continue on your current medications as directed. Please refer to the Current Medication list given to you today.  Labwork: Your physician recommends that you return for a FASTING LIPID, LIVER and BMP in 1 YEAR (nothing to eat or drink after midnight)--lab opens at 7:30 AM  Testing/Procedures: No new orders.   Follow-Up: Your physician wants you to follow-up in: 1 YEAR with Dr [name].  You will receive a reminder letter in the mail two months in advance. If you don't receive a letter, please call our office to schedule the follow-up appointment.   Any Other Special Instructions Will Be Listed Below (If Applicable).

## 2014-11-19 NOTE — Patient Instructions (Signed)
I will be in touch with your labs Take care and let's plan to recheck in about 6 months

## 2015-01-08 ENCOUNTER — Other Ambulatory Visit: Payer: Self-pay | Admitting: Cardiovascular Disease

## 2015-03-13 ENCOUNTER — Ambulatory Visit (INDEPENDENT_AMBULATORY_CARE_PROVIDER_SITE_OTHER): Payer: BLUE CROSS/BLUE SHIELD | Admitting: Physician Assistant

## 2015-03-13 VITALS — BP 134/80 | HR 84 | Temp 98.0°F | Resp 18 | Ht 70.0 in | Wt 211.0 lb

## 2015-03-13 DIAGNOSIS — R450 Nervousness: Secondary | ICD-10-CM

## 2015-03-13 DIAGNOSIS — R0789 Other chest pain: Secondary | ICD-10-CM

## 2015-03-13 LAB — COMPLETE METABOLIC PANEL WITH GFR
ALT: 34 U/L (ref 9–46)
AST: 26 U/L (ref 10–35)
Albumin: 4.3 g/dL (ref 3.6–5.1)
Alkaline Phosphatase: 72 U/L (ref 40–115)
BUN: 12 mg/dL (ref 7–25)
CALCIUM: 9.6 mg/dL (ref 8.6–10.3)
CHLORIDE: 101 mmol/L (ref 98–110)
CO2: 26 mmol/L (ref 20–31)
Creat: 0.87 mg/dL (ref 0.70–1.33)
GFR, Est African American: >89 mL/min (ref >=60)
GLUCOSE: 109 mg/dL — AB (ref 65–99)
POTASSIUM: 5.1 mmol/L (ref 3.5–5.3)
Sodium: 140 mmol/L (ref 135–146)
Total Bilirubin: 0.5 mg/dL (ref 0.2–1.2)
Total Protein: 7.4 g/dL (ref 6.1–8.1)

## 2015-03-13 LAB — POCT CBC
GRANULOCYTE PERCENT: 72.1 % (ref 37–80)
HEMATOCRIT: 41.1 % — AB (ref 43.5–53.7)
Hemoglobin: 14.7 g/dL (ref 14.1–18.1)
Lymph, poc: 2.1 (ref 0.6–3.4)
MCH, POC: 30.6 pg (ref 27–31.2)
MCHC: 35.7 g/dL — AB (ref 31.8–35.4)
MCV: 85.7 fL (ref 80–97)
MID (CBC): 0.8 (ref 0–0.9)
MPV: 6.4 fL (ref 0–99.8)
PLATELET COUNT, POC: 293 10*3/uL (ref 142–424)
POC GRANULOCYTE: 7.3 — AB (ref 2–6.9)
POC LYMPH %: 20.4 % (ref 10–50)
POC MID %: 7.5 %M (ref 0–12)
RBC: 4.8 M/uL (ref 4.69–6.13)
RDW, POC: 12.7 %
WBC: 10.1 10*3/uL (ref 4.6–10.2)

## 2015-03-13 LAB — TSH: TSH: 1.796 u[IU]/mL (ref 0.350–4.500)

## 2015-03-13 LAB — GLUCOSE, POCT (MANUAL RESULT ENTRY): POC Glucose: 111 mg/dl — AB (ref 70–99)

## 2015-03-13 NOTE — Progress Notes (Signed)
[facility] [address] [place] [address] [phone]  Date:  03/13/2015   Name:  Chris [name]   DOB:  04/05/1955   MRN:  [id number]  PCP:  Abbe Amsterdam, MD    History of Present Illness:  Chris [name] is a 59 y.o. male patient who presents to [facility] for cc of anxiety.     --Yesterday, he felt chilly and jittery in his chest.  He had eaten and then he proceeded to work out.  He has no chest pains associated or sob, diaphoresis, dizziness, or nausea.  This jittery sensation lasted about 30 minutes and never returned.  He did nothing to help with the relief.  He reports that he was lifting weights prior to the sensation.  These were about 20lb weights and he was performing arm curls, etc.  This was a new exercise.  He generally does treadmill.  He has not had any symptoms with his work out.  He does report arm and sides of his chest that are sore today.    He reports similar feeling when he had his MI years ago (2009?), however he can not be sure, and does not recall if he had chest pain during MI.     Dr. Lourena Simmonds is cardiologist.  Last seen 4 months ago with general evaluation.   Patient Active Problem List   Diagnosis Date Noted  . Overweight 05/21/2014  . History of MI (myocardial infarction) 05/21/2014  . Unstable angina (HCC) 10/10/2012  . ED (erectile dysfunction) 07/25/2012  . RESTLESS LEGS SYNDROME 02/20/2010  . Hyperlipidemia 02/19/2010  . OBSTRUCTIVE SLEEP APNEA 02/19/2010  . CAD (coronary artery disease) 02/19/2010    Past Medical History  Diagnosis Date  . Acute MI, inferior wall, initial episode of care [facility]) 2009  . Other and unspecified hyperlipidemia   . Coronary atherosclerosis of unspecified type of vessel, native or graft   . Sleep apnea   . Allergy     Past Surgical History  Procedure Laterality Date  . Cholecystectomy    . Coronary stent placement  2009     2.75 x 28 mm Promus stent to the RCA    Social History   Substance Use Topics  . Smoking status: Former Smoker    Quit date: 08/22/2007  . Smokeless tobacco: None  . Alcohol Use: Yes     Comment: very little    Family History  Problem Relation Age of Onset  . Heart disease Mother   . Prostate cancer Brother     Allergies  Allergen Reactions  . Ace Inhibitors Rash    REACTION: Rash  . Ropinirole Hcl Rash    Medication list has been reviewed and updated.  Current Outpatient Prescriptions on File Prior to Visit  Medication Sig Dispense Refill  . aspirin 81 MG tablet Take 81 mg by mouth daily.     [name] atorvastatin (LIPITOR) 40 MG tablet TAKE 1 TABLET BY MOUTH EVERY DAY 30 tablet 10  . clopidogrel (PLAVIX) 75 MG tablet TAKE 1 TABLET BY MOUTH EVERY DAY 30 tablet 6  . metoprolol succinate (TOPROL-XL) 25 MG 24 hr tablet TAKE 1/2 TABLET BY MOUTH 2 TIMES DAILY 30 tablet 11  . Multiple Vitamin (MULTIVITAMIN) tablet Take 1 tablet by mouth daily.    . Omega-3 Fatty Acids (FISH OIL) 1200 MG CAPS Take 2 capsules by mouth 2 (two) times daily at 10 AM and 5 PM.     . pantoprazole (PROTONIX) 40  MG tablet Take 40 mg by mouth daily as needed (reflux).     No current facility-administered medications on file prior to visit.    ROS ROS otherwise unremarkable unless listed above.  Physical Examination: BP 134/80 mmHg  Pulse 84  Temp(Src) 98 F (36.7 C) (Oral)  Resp 18  Ht 5\' 10"  (1.778 m)  Wt 211 lb (95.709 kg)  BMI 30.28 kg/m2  SpO2 97% Ideal Body Weight: Weight in (lb) to have BMI = 25: 173.9  Physical Exam  Constitutional: He is oriented to person, place, and time. He appears well-developed and well-nourished. No distress.  HENT:  Head: Normocephalic and atraumatic.  Eyes: Conjunctivae and EOM are normal. Pupils are equal, round, and reactive to light.  Cardiovascular: Normal rate, regular rhythm and normal heart sounds.  Exam reveals no gallop and no friction rub.   No murmur heard. Pulses:      Carotid pulses are 2+ on the right  side, and 2+ on the left side.      Radial pulses are 2+ on the right side, and 2+ on the left side.       Dorsalis pedis pulses are 2+ on the right side, and 2+ on the left side.  Pulmonary/Chest: Effort normal. No respiratory distress.  Neurological: He is alert and oriented to person, place, and time.  Skin: Skin is warm and dry. He is not diaphoretic.  Psychiatric: He has a normal mood and affect. His behavior is normal.    Results for orders placed or performed in visit on 03/13/15  POCT CBC  Result Value Ref Range   WBC 10.1 4.6 - 10.2 K/uL   Lymph, poc 2.1 0.6 - 3.4   POC LYMPH PERCENT 20.4 10 - 50 %L   MID (cbc) 0.8 0 - 0.9   POC MID % 7.5 0 - 12 %M   POC Granulocyte 7.3 (A) 2 - 6.9   Granulocyte percent 72.1 37 - 80 %G   RBC 4.80 4.69 - 6.13 M/uL   Hemoglobin 14.7 14.1 - 18.1 g/dL   HCT, POC 60.141.1 (A) 09.343.5 - 53.7 %   MCV 85.7 80 - 97 fL   MCH, POC 30.6 27 - 31.2 pg   MCHC 35.7 (A) 31.8 - 35.4 g/dL   RDW, POC 23.512.7 %   Platelet Count, POC 293 142 - 424 K/uL   MPV 6.4 0 - 99.8 fL  POCT glucose (manual entry)  Result Value Ref Range   POC Glucose 111 (A) 70 - 99 mg/dl      Assessment and Plan: Phat Edmund Hilda Viner is a 59 y.o. male who is here today for jittery sensation.   This appears to be more of involuntary muscle spasms following the work out. -however, I am going to refer him back to cardiology due to this for eval.  No recent echo or exercise--patient is concerned and I would like him to be reassured.    Jittery - Plan: POCT CBC, EKG 12-Lead, COMPLETE METABOLIC PANEL WITH GFR, TSH, POCT glucose (manual entry), Ambulatory referral to Cardiology  Chest discomfort - Plan: Ambulatory referral to Cardiology   Trena PlattStephanie English, PA-C [facility] 03/13/2015 9:04 AM

## 2015-03-13 NOTE — Patient Instructions (Signed)
I am placing a referral to your cardiologist for review of your symptoms. Please await this contact. Please lay off the sugar, artificial drinks, carbonated beverages, desserts.  I know that it is hard during the holidays, but be mindful of your consumption of these foods and drinks.

## 2015-03-16 ENCOUNTER — Telehealth: Payer: Self-pay | Admitting: *Deleted

## 2015-03-16 NOTE — Telephone Encounter (Signed)
Patient stated he was still having pain in side, consulted with Dr. Merla Richesoolittle and advised patient to take ibuprofen if not better in 3-4 days can RTC.

## 2015-04-09 ENCOUNTER — Encounter: Payer: Self-pay | Admitting: Cardiovascular Disease

## 2015-04-09 ENCOUNTER — Ambulatory Visit (INDEPENDENT_AMBULATORY_CARE_PROVIDER_SITE_OTHER): Payer: BLUE CROSS/BLUE SHIELD | Admitting: Cardiovascular Disease

## 2015-04-09 VITALS — BP 144/84 | HR 66 | Ht 70.0 in | Wt 216.2 lb

## 2015-04-09 DIAGNOSIS — I25810 Atherosclerosis of coronary artery bypass graft(s) without angina pectoris: Secondary | ICD-10-CM

## 2015-04-09 DIAGNOSIS — E785 Hyperlipidemia, unspecified: Secondary | ICD-10-CM

## 2015-04-09 NOTE — Progress Notes (Signed)
Chris [name] Date of Birth  05-31-1955 [facility] / [facility] [address]     [address] [place], [place]  [address] [phone]  Fax  [phone]  Problems: 1. Coronary artery disease- He presented in 2009 with some episodes of chest pain and was found to have an inferior wall myocardial infarction. He underwent successful PTCA and stenting of his mid  right coronary artery using a 2.75 x 28 mm Promus stent. The stent was post dilated using a 3.0 noncompliant balloon.  We then positioned a 3.0 x 15 mm Promus stent in the proximal right coronary artery.  The stent was post dilated using a 3.25 mm noncompliant balloon. 2. Hyperlipidemia 3. Sleep apnea 4. Hypertension  History of Present Illness:  Chris [name] is a 60 year old gentleman. He has a history of coronary artery disease. He presented in 2009 with some episodes of chest pain and was found to have an inferior wall myocardial infarction. He underwent successful PTCA and stenting of his mid  right coronary artery using a 2.75 x 28 mm Promus stent. The stent was post dilated using a 3.0 noncompliant balloon.  We then positioned a 3.0 x 15 mm Promus stent in the proximal right coronary artery.  The stent was post dilated using a 3.25 mm noncompliant balloon.  Pt is doing well.  No angina or dyspnea.    He has not been sleeping well.  He also did not take his meds this am.  April 13, 2012: Chris [name] is doing well from a cardiac standpoint.  Exercising on the treadmill every other day. Complains of constipation.  October 17, 2012:  Thank started having some episodes of chest pain last week.  He had been doing lots of physical activity - played 36 holes of golf a day  for 2 days straight.  He also has been busy doing phyisical work.     He had a stress Myoview study. He walked for 11 minutes on a standard Bruce protocol triple test. A Myoview study revealed an old inferior wall myocardial infarction but was otherwise  unremarkable.   June 14, 2013:  He is doing well .Chris [name] Has some back pain and "crick" in his neck.  No CP .  Staying active, walking in the evenings 2.5 miles   Sept. 29, 2015:  Chris [name] is doing ok.  He is not working at [employer].   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  Doing well from a cardiac standpoint.  Still walking quite a bit.    Aug. 29, 2016:  Doing well. Owns his own business.  [name]  No CP , no dyspnea.   Jan. 17, 2017: No angina  Is staying busy Working hard.    Current Outpatient Prescriptions on File Prior to Visit  Medication Sig Dispense Refill  . aspirin 81 MG tablet Take 81 mg by mouth daily.     Chris [name] atorvastatin (LIPITOR) 40 MG tablet TAKE 1 TABLET BY MOUTH EVERY DAY 30 tablet 10  . clopidogrel (PLAVIX) 75 MG tablet TAKE 1 TABLET BY MOUTH EVERY DAY 30 tablet 6  . metoprolol succinate (TOPROL-XL) 25 MG 24 hr tablet TAKE 1/2 TABLET BY MOUTH 2 TIMES DAILY 30 tablet 11  . Multiple Vitamin (MULTIVITAMIN) tablet Take 1 tablet by mouth daily.    . Omega-3 Fatty Acids (FISH OIL) 1200 MG CAPS Take 2 capsules by mouth 2 (two) times daily at 10 AM and 5 PM.     .  pantoprazole (PROTONIX) 40 MG tablet Take 40 mg by mouth daily as needed (reflux).     No current facility-administered medications on file prior to visit.    Allergies  Allergen Reactions  . Ace Inhibitors Rash    REACTION: Rash  . Ropinirole Hcl Rash    Past Medical History  Diagnosis Date  . Acute MI, inferior wall, initial episode of care [facility]) 2009  . Other and unspecified hyperlipidemia   . Coronary atherosclerosis of unspecified type of vessel, native or graft   . Sleep apnea   . Allergy     Past Surgical History  Procedure Laterality Date  . Cholecystectomy    . Coronary stent placement  2009     2.75 x 28 mm Promus stent to the RCA    History  Smoking status  . Former Smoker  . Quit date: 08/22/2007  Smokeless tobacco  . Not on file     History  Alcohol Use  . Yes    Comment: very little    Family History  Problem Relation Age of Onset  . Heart disease Mother   . Prostate cancer Brother     Reviw of Systems:  Reviewed in the HPI.  All other systems are negative.  Physical Exam: BP 144/84 mmHg  Pulse 66  Ht  (1.778 m)  Wt 216 lb 3.2 oz (98.068 kg)  BMI 31.02 kg/m2 The patient is alert and oriented x 3.  The mood and affect are normal.   Skin: warm and dry.  Color is normal.    HEENT:   the sclera are nonicteric.  The mucous membranes are moist.  The carotids are 2+ without bruits.  There is no thyromegaly.  There is no JVD.    Lungs: clear.  The chest wall is non tender.    Heart: regular rate with a normal S1 and S2.  There are no murmurs, gallops, or rubs. The PMI is not displaced.     Abdomen: good bowel sounds.  There is no guarding or rebound.  There is no hepatosplenomegaly or tenderness.  There are no masses.   Extremities:  no clubbing, cyanosis, or edema.  The legs are without rashes.  The distal pulses are intact.   Neuro:  Cranial nerves II - XII are intact.  Motor and sensory functions are intact.    The gait is normal.  EKG: Jan. 17, 2017- NSR at 64.   Inc. RBBB,    Assessment / Plan:   .1. Coronary artery disease-   he's doing well. Is not having any episodes of angina.  2. Hyperlipidemia-   His triglyceride levels remained little elevated.   3. Sleep apnea  4. Hypertension -  His blood pressure is normal.      Nahser, Deloris Ping, MD  04/09/2015 9:05 AM    [facility] [address] [place],  [address] [place], [place]  [address] Pager [phone] Phone: [phone]; Fax: [phone]   [facility]  [address] [place], [place]  [address] [phone]   Fax [phone]

## 2015-04-09 NOTE — Patient Instructions (Signed)

## 2015-04-12 ENCOUNTER — Encounter: Payer: Self-pay | Admitting: Family Medicine

## 2015-04-17 ENCOUNTER — Encounter: Payer: Self-pay | Admitting: Family Medicine

## 2015-05-12 ENCOUNTER — Other Ambulatory Visit: Payer: Self-pay | Admitting: Cardiovascular Disease

## 2015-05-22 ENCOUNTER — Ambulatory Visit: Payer: Self-pay | Admitting: Family Medicine

## 2015-07-15 ENCOUNTER — Other Ambulatory Visit: Payer: Self-pay | Admitting: Cardiovascular Disease

## 2015-07-15 NOTE — Telephone Encounter (Signed)
This was sent by paper earlier today

## 2015-08-12 ENCOUNTER — Ambulatory Visit (INDEPENDENT_AMBULATORY_CARE_PROVIDER_SITE_OTHER): Payer: BLUE CROSS/BLUE SHIELD | Admitting: Physician Assistant

## 2015-08-12 VITALS — BP 148/88 | HR 60 | Temp 98.1°F | Resp 16 | Ht 69.0 in | Wt 216.0 lb

## 2015-08-12 DIAGNOSIS — B9789 Other viral agents as the cause of diseases classified elsewhere: Secondary | ICD-10-CM

## 2015-08-12 DIAGNOSIS — Z23 Encounter for immunization: Secondary | ICD-10-CM | POA: Diagnosis not present

## 2015-08-12 DIAGNOSIS — J069 Acute upper respiratory infection, unspecified: Secondary | ICD-10-CM

## 2015-08-12 DIAGNOSIS — R12 Heartburn: Secondary | ICD-10-CM | POA: Diagnosis not present

## 2015-08-12 MED ORDER — HYDROCOD POLST-CPM POLST ER 10-8 MG/5ML PO SUER: 5.0000 mL | Freq: Two times a day (BID) | ORAL | Status: DC | PRN

## 2015-08-12 MED ORDER — GUAIFENESIN ER 1200 MG PO TB12: 1.0000 | ORAL_TABLET | Freq: Two times a day (BID) | ORAL | Status: AC

## 2015-08-12 MED ORDER — PANTOPRAZOLE SODIUM 40 MG PO TBEC: 40.0000 mg | DELAYED_RELEASE_TABLET | Freq: Every day | ORAL | Status: DC | PRN

## 2015-08-12 NOTE — Patient Instructions (Signed)
     IF you received an x-ray today, you will receive an invoice from [facility]. Please contact [facility] at [phone] with questions or concerns regarding your invoice.   IF you received labwork today, you will receive an invoice from [facility]. Please contact [facility] at [phone] with questions or concerns regarding your invoice.   Our billing staff will not be able to assist you with questions regarding bills from these companies.  You will be contacted with the lab results as soon as they are available. The fastest way to get your results is to activate your My Chart account. Instructions are located on the last page of this paperwork. If you have not heard from us regarding the results in 2 weeks, please contact this office.      

## 2015-08-12 NOTE — Progress Notes (Signed)
Chris RutherfordHank E [name]  MRN: [id number] DOB: 1955/04/02  Subjective:  Pt presents to clinic with cough for the last couple of weeks just a intermittent cough.  It is sometimes productive.  He has no SOB or wheezing. NO h/o lung disease - he sopped smoking 8 years ago -  [place] woke up 3 days ago feeling [name]  His nasal congestion got much better once he started Flonase - he has problems with seasonal allergies and he thinks this started with that.  He was up most of the night from the cough last night.  He has sleep apnea but he does not use CPAP.  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  Home treatment - flonase, cold prep  Patient Active Problem List   Diagnosis Date Noted  . Overweight 05/21/2014  . History of MI (myocardial infarction) 05/21/2014  . Unstable angina (HCC) 10/10/2012  . ED (erectile dysfunction) 07/25/2012  . RESTLESS LEGS SYNDROME 02/20/2010  . Hyperlipidemia 02/19/2010  . OBSTRUCTIVE SLEEP APNEA 02/19/2010  . CAD (coronary artery disease) 02/19/2010    Current Outpatient Prescriptions on File Prior to Visit  Medication Sig Dispense Refill  . aspirin 81 MG tablet Take 81 mg by mouth daily.     [name]. atorvastatin (LIPITOR) 40 MG tablet TAKE 1 TABLET BY MOUTH EVERY DAY 30 tablet 10  . clopidogrel (PLAVIX) 75 MG tablet TAKE 1 TABLET BY MOUTH EVERY DAY 30 tablet 11  . metoprolol succinate (TOPROL-XL) 25 MG 24 hr tablet TAKE 1/2 TABLET BY MOUTH 2 TIMES DAILY 30 tablet 11  . Multiple Vitamin (MULTIVITAMIN) tablet Take 1 tablet by mouth daily.    . Omega-3 Fatty Acids (FISH OIL) 1200 MG CAPS Take 2 capsules by mouth 2 (two) times daily at 10 AM and 5 PM.      No current facility-administered medications on file prior to visit.    Allergies  Allergen Reactions  . Ace Inhibitors Rash    REACTION: Rash  . Ropinirole Hcl Rash    Review of Systems  Constitutional:  Negative for fever and chills.  HENT: Positive for congestion, postnasal drip, rhinorrhea (yellow) and sore throat.   Respiratory: Positive for cough (intermittent).   Gastrointestinal: Negative.   Musculoskeletal: Negative for myalgias.  Neurological: Negative for headaches.   Objective:  BP 148/88 mmHg  Pulse 60  Temp(Src) 98.1 F (36.7 C) (Oral)  Resp 16  Ht 5\' 9"  (1.753 m)  Wt 216 lb (97.977 kg)  BMI 31.88 kg/m2  SpO2 99%  Physical Exam  Constitutional: He is oriented to person, place, and time and well-developed, well-nourished, and in no distress.  HENT:  Head: Normocephalic and atraumatic.  Right Ear: Hearing, tympanic membrane, external ear and ear canal normal.  Left Ear: Hearing, tympanic membrane and external ear normal. A foreign body (wax) is present.  Nose: Nose normal.  Mouth/Throat: Uvula is midline, oropharynx is clear and moist and mucous membranes are normal.  Eyes: Conjunctivae are normal.  Neck: Normal range of motion.  Cardiovascular: Normal rate, regular rhythm and normal heart sounds.   Pulmonary/Chest: Effort normal and breath sounds normal. He has no wheezes.  Lymphadenopathy:       Head (right side): No tonsillar adenopathy present.       Head (left side): No tonsillar adenopathy present.    He has no cervical adenopathy.  Right: No supraclavicular adenopathy present.       Left: No supraclavicular adenopathy present.  Neurological: He is alert and oriented to person, place, and time. Gait normal.  Skin: Skin is warm and dry.  Psychiatric: Mood, memory, affect and judgment normal.    Assessment and Plan :  Heartburn - Plan: pantoprazole (PROTONIX) 40 MG tablet  Viral URI with cough - Plan: Guaifenesin (MUCINEX MAXIMUM STRENGTH) 1200 MG TB12, chlorpheniramine-HYDROcodone (TUSSIONEX PENNKINETIC ER) 10-8 MG/5ML SUER, Care order/instruction - pt to continue his flonase and push fluids - this is still viral at this point but he will let me know  if it is not improving over the next 4-5 days -   Need for Tdap vaccination - Plan: Tdap vaccine greater than or equal to 7yo IM  Benny Lennert PA-C  [facility] 08/12/2015 9:28 AM

## 2015-10-07 ENCOUNTER — Other Ambulatory Visit: Payer: Self-pay | Admitting: Cardiovascular Disease

## 2015-10-07 NOTE — Telephone Encounter (Signed)
atorvastatin (LIPITOR) 40 MG tablet  Medication   Date: 01/08/2015  Department: [facility] [address] [facility]  Ordering/Authorizing: Vesta MixerPhilip J Nahser, MD      Order Providers    Prescribing Provider Encounter Provider   Vesta MixerPhilip J Nahser, MD Vesta MixerPhilip J Nahser, MD    Medication Detail      Disp Refills Start End     atorvastatin (LIPITOR) 40 MG tablet 30 tablet 10 01/08/2015     Sig: TAKE 1 TABLET BY MOUTH EVERY DAY    E-Prescribing Status: Receipt confirmed by pharmacy (01/08/2015 2:15 PM EDT)     Pharmacy    [id number] - [place], [place] - [address]

## 2015-11-08 ENCOUNTER — Ambulatory Visit (INDEPENDENT_AMBULATORY_CARE_PROVIDER_SITE_OTHER): Payer: BLUE CROSS/BLUE SHIELD

## 2015-11-08 ENCOUNTER — Ambulatory Visit (INDEPENDENT_AMBULATORY_CARE_PROVIDER_SITE_OTHER): Payer: BLUE CROSS/BLUE SHIELD | Admitting: Physician Assistant

## 2015-11-08 VITALS — BP 136/84 | HR 68 | Temp 98.0°F | Resp 18 | Ht 69.0 in | Wt 217.0 lb

## 2015-11-08 DIAGNOSIS — S82402A Unspecified fracture of shaft of left fibula, initial encounter for closed fracture: Secondary | ICD-10-CM | POA: Diagnosis not present

## 2015-11-08 DIAGNOSIS — M25572 Pain in left ankle and joints of left foot: Secondary | ICD-10-CM

## 2015-11-08 MED ORDER — TRAMADOL HCL 50 MG PO TABS
50.0000 mg | ORAL_TABLET | Freq: Three times a day (TID) | ORAL | 0 refills | Status: DC | PRN
Start: 2015-11-08 — End: 2016-06-11

## 2015-11-08 NOTE — Progress Notes (Signed)
11/08/2015 3:20 PM   DOB: Nov 27, 1955 / MRN: [id number]  SUBJECTIVE:  Chris [name] is a 60 y.o. male presenting for left ankle pain after falling off a ladder from about twelve feet.  He has been unable to walk on the left side and is having severe pain about the left distal fibula.  He has not taken anything for pain.    He is allergic to ace inhibitors and ropinirole hcl.   He  has a past medical history of Acute MI, inferior wall, initial episode of care [facility](HCC) (2009); Allergy; Coronary atherosclerosis of unspecified type of vessel, native or graft; Other and unspecified hyperlipidemia; and Sleep apnea.    He  reports that he quit smoking about 8 years ago. He has never used smokeless tobacco. He reports that he drinks alcohol. He reports that he does not use drugs. He  reports that he currently engages in sexual activity. The patient  has a past surgical history that includes Cholecystectomy and Coronary stent placement (2009).  His family history includes Heart disease in his mother; Prostate cancer in his brother.  Review of Systems  Constitutional: Negative for fever.  Cardiovascular: Negative for chest pain.  Genitourinary: Negative for dysuria.  Musculoskeletal: Positive for falls and joint pain. Negative for back pain, myalgias and neck pain.  Skin: Negative for rash.  Neurological: Negative for dizziness and headaches.  Psychiatric/Behavioral: Negative for depression.    The problem list and medications were reviewed and updated by myself where necessary and exist elsewhere in the encounter.   OBJECTIVE:  BP 136/84 (BP Location: Right Arm, Patient Position: Sitting, Cuff Size: Large)   Pulse 68   Temp 98 F (36.7 C) (Oral)   Resp 18   Ht 5\' 9"  (1.753 m)   Wt 217 lb (98.4 kg)   SpO2 99%   BMI 32.05 kg/m   Physical Exam  Constitutional: He is oriented to person, place, and time. He appears well-developed and well-nourished. No distress.  Cardiovascular: Normal rate  and regular rhythm.   Pulmonary/Chest: Effort normal and breath sounds normal.  Musculoskeletal:       Left ankle: He exhibits decreased range of motion and swelling. He exhibits no deformity and normal pulse. Tenderness. Lateral malleolus tenderness found. No medial malleolus, no AITFL, no CF ligament, no posterior TFL, no head of 5th metatarsal and no proximal fibula tenderness found.       Feet:  Neurological: He is alert and oriented to person, place, and time.  Skin: Skin is warm and dry. He is not diaphoretic.  Psychiatric: He has a normal mood and affect.    No results found for this or any previous visit (from the past 72 hour(s)).  Dg Tibia/fibula Left  Result Date: 11/08/2015 CLINICAL DATA:  Fall. EXAM: LEFT TIBIA AND FIBULA - 2 VIEW COMPARISON:  08/16/2014. FINDINGS: Displaced spiral oblique fracture of the distal fibula is noted. Slight comminution noted. Tiny bony density noted adjacent to the distal tip of the medial malleolus and medial aspect of the talus. Small avulsion fracture cannot be excluded . IMPRESSION: 1. Displaced spiral oblique fracture of the distal fibula diaphysis. Slight comminution noted. 2. Tiny bony density noted adjacent to the distal tip of the medial malleolus and medial aspect the talus. Small avulsion fracture cannot be excluded. This may be old. Electronically Signed   By: Maisie Fushomas  Register   On: 11/08/2015 15:01   Dg Ankle Complete Left  Result Date: 11/08/2015 CLINICAL DATA:  Fall. EXAM:  LEFT ANKLE COMPLETE - 3+ VIEW COMPARISON:  08/16/2014. FINDINGS: Displaced spiral oblique fracture of the distal left fibular diaphysis noted. Tiny bony density noted adjacent to the distal tip of the medial malleolus and medial aspect of the talus. Tiny avulsion fracture, age undetermined, cannot be excluded. No other focal abnormality identified. No radiopaque foreign body . IMPRESSION: 1. Displaced oblique fracture of the distal left fibular diaphysis . 2. Tiny bony  density noted adjacent to the distal tip of the medial malleolus and medial aspect of the talus. Tiny avulsion fracture, age undetermined. Cannot be excluded. Electronically Signed   By: Maisie Fushomas  Register   On: 11/08/2015 15:01    ASSESSMENT AND PLAN  Chris [name] was seen today for fall.  Diagnoses and all orders for this visit:  Left ankle pain -     DG Ankle Complete Left; Future -     DG Tibia/Fibula Left; Future  Closed fibular fracture, left, initial encounter: He is non weight bearing and is in a posterior stirrup splint. Referral to ortho pending and should take place early next week.  -     AMB referral to orthopedics -     traMADol (ULTRAM) 50 MG tablet; Take 1 tablet (50 mg total) by mouth every 8 (eight) hours as needed.    The patient is advised to call or return to clinic if he does not see an improvement in symptoms, or to seek the care of the closest emergency department if he worsens with the above plan.   Deliah BostonMichael Rossy Virag, MHS, PA-C [facility] 11/08/2015 3:20 PM

## 2015-11-08 NOTE — Patient Instructions (Addendum)
For pain take Tylenol 1000 mg every eight hours.  Okay to take tramadol 50 mg every 8 hours for severe pain.    Please go to your ortho appointment.     IF you received an x-ray today, you will receive an invoice from [facility]. Please contact [facility] at [phone] with questions or concerns regarding your invoice.   IF you received labwork today, you will receive an invoice from [facility]. Please contact [facility] at [phone] with questions or concerns regarding your invoice.   Our billing staff will not be able to assist you with questions regarding bills from these companies.  You will be contacted with the lab results as soon as they are available. The fastest way to get your results is to activate your My Chart account. Instructions are located on the last page of this paperwork. If you have not heard from us regarding the results in 2 weeks, please contact this office.

## 2015-11-09 ENCOUNTER — Other Ambulatory Visit: Payer: Self-pay | Admitting: Cardiovascular Disease

## 2015-11-11 ENCOUNTER — Other Ambulatory Visit: Payer: BLUE CROSS/BLUE SHIELD | Admitting: *Deleted

## 2015-11-11 DIAGNOSIS — I25810 Atherosclerosis of coronary artery bypass graft(s) without angina pectoris: Secondary | ICD-10-CM

## 2015-11-11 LAB — LIPID PANEL
CHOLESTEROL: 105 mg/dL — AB (ref 125–200)
HDL: 39 mg/dL — AB (ref >=40)
LDL Cholesterol: 45 mg/dL (ref <130)
Total CHOL/HDL Ratio: 2.7 Ratio (ref <=5.0)
Triglycerides: 106 mg/dL (ref <150)
VLDL: 21 mg/dL (ref <30)

## 2015-11-11 LAB — COMPREHENSIVE METABOLIC PANEL
ALT: 27 U/L (ref 9–46)
AST: 22 U/L (ref 10–35)
Albumin: 3.8 g/dL (ref 3.6–5.1)
Alkaline Phosphatase: 67 U/L (ref 40–115)
BUN: 9 mg/dL (ref 7–25)
CHLORIDE: 100 mmol/L (ref 98–110)
CO2: 28 mmol/L (ref 20–31)
Calcium: 8.9 mg/dL (ref 8.6–10.3)
Creat: 0.87 mg/dL (ref 0.70–1.25)
GLUCOSE: 112 mg/dL — AB (ref 65–99)
POTASSIUM: 4 mmol/L (ref 3.5–5.3)
SODIUM: 135 mmol/L (ref 135–146)
Total Bilirubin: 0.8 mg/dL (ref 0.2–1.2)
Total Protein: 6.5 g/dL (ref 6.1–8.1)

## 2015-11-12 ENCOUNTER — Ambulatory Visit (INDEPENDENT_AMBULATORY_CARE_PROVIDER_SITE_OTHER): Payer: BLUE CROSS/BLUE SHIELD | Admitting: Physician Assistant

## 2015-11-12 VITALS — BP 122/80 | HR 77 | Temp 98.0°F | Resp 16 | Wt 218.2 lb

## 2015-11-12 DIAGNOSIS — S80822A Blister (nonthermal), left lower leg, initial encounter: Secondary | ICD-10-CM | POA: Diagnosis not present

## 2015-11-12 DIAGNOSIS — S82402A Unspecified fracture of shaft of left fibula, initial encounter for closed fracture: Secondary | ICD-10-CM | POA: Diagnosis not present

## 2015-11-12 NOTE — Patient Instructions (Addendum)
Elevate your foot - keep the boot in place Go to your appointment on Thursday with ortho

## 2015-11-12 NOTE — Progress Notes (Signed)
Roe RutherfordHank E Suleiman  MRN: [id number] DOB: Jan 28, 1956  Subjective:  Pt presents to clinic with concerns regarding his splint and pain.  He has not been weight bearing for the last 4 days since he was diagnosed with a fibular fracture on 8/18.  He wore the splint that was placed and he noticed that he was starting to have a different pain so he unwrapped it and he noticed multiple blisters along side the edge of the splint.  He has not been elevating his leg.  He did hear about his ortho appt in 2 days.  Review of Systems  Constitutional: Negative for chills and fever.    Patient Active Problem List   Diagnosis Date Noted  . Overweight 05/21/2014  . History of MI (myocardial infarction) 05/21/2014  . Unstable angina (HCC) 10/10/2012  . ED (erectile dysfunction) 07/25/2012  . RESTLESS LEGS SYNDROME 02/20/2010  . Hyperlipidemia 02/19/2010  . OBSTRUCTIVE SLEEP APNEA 02/19/2010  . CAD (coronary artery disease) 02/19/2010    Current Outpatient Prescriptions on File Prior to Visit  Medication Sig Dispense Refill  . aspirin 81 MG tablet Take 81 mg by mouth daily.     [name]. atorvastatin (LIPITOR) 40 MG tablet TAKE 1 TABLET BY MOUTH EVERY DAY 30 tablet 10  . clopidogrel (PLAVIX) 75 MG tablet TAKE 1 TABLET BY MOUTH EVERY DAY 30 tablet 11  . metoprolol succinate (TOPROL-XL) 25 MG 24 hr tablet TAKE 1/2 TABLET BY MOUTH 2 TIMES DAILY 30 tablet 11  . Multiple Vitamin (MULTIVITAMIN) tablet Take 1 tablet by mouth daily.    . Omega-3 Fatty Acids (FISH OIL) 1200 MG CAPS Take 2 capsules by mouth 2 (two) times daily at 10 AM and 5 PM.     . pantoprazole (PROTONIX) 40 MG tablet Take 1 tablet (40 mg total) by mouth daily as needed (reflux). 90 tablet 1  . traMADol (ULTRAM) 50 MG tablet Take 1 tablet (50 mg total) by mouth every 8 (eight) hours as needed. 30 tablet 0   No current facility-administered medications on file prior to visit.     Allergies  Allergen Reactions  . Ace Inhibitors Rash    REACTION:  Rash  . Ropinirole Hcl Rash    Pt patients past, family and social history were reviewed and updated.  Objective:  BP 122/80 (BP Location: Right Arm, Patient Position: Sitting, Cuff Size: Normal)   Pulse 77   Temp 98 F (36.7 C) (Oral)   Resp 16   Wt 218 lb 3.2 oz (99 kg)   SpO2 100%   BMI 32.22 kg/m   Physical Exam  Constitutional: He is oriented to person, place, and time and well-developed, well-nourished, and in no distress.  HENT:  Head: Normocephalic and atraumatic.  Right Ear: External ear normal.  Left Ear: External ear normal.  Eyes: Conjunctivae are normal.  Neck: Normal range of motion.  Pulmonary/Chest: Effort normal.  Musculoskeletal:  Left distal 2/3 of leg and ankle and foot swollen - good DP pulses and good capillary refill and toes are warm - ecchymosis present on medial aspect of foot  Neurological: He is alert and oriented to person, place, and time. Gait normal.  Skin: Skin is warm and dry.  Friction blisters along the edge of the posterior splint  Psychiatric: Mood, memory, affect and judgment normal.      Procedure:  Consent obtained - #11 blade used to open the blister along the skin surface and a silvadene dressing was placed under a camwalker.  Assessment and Plan :  Closed fibular fracture, left, initial encounter   camwalker placed - pt will continue to be NWB and he will start to elevate his foot due to swelling - continue medications given to him at last visit and f/u with ortho as planned in 2 days  Benny LennertSarah Kamrin Spath PA-C  [facility] 11/12/2015 9:20 AM

## 2015-11-14 ENCOUNTER — Other Ambulatory Visit: Payer: Self-pay | Admitting: Orthopedic Surgery

## 2015-11-14 DIAGNOSIS — S82852A Displaced trimalleolar fracture of left lower leg, initial encounter for closed fracture: Secondary | ICD-10-CM | POA: Diagnosis not present

## 2015-11-15 ENCOUNTER — Telehealth: Payer: Self-pay | Admitting: Cardiovascular Disease

## 2015-11-15 NOTE — Telephone Encounter (Signed)
Mr. Chris [name] is at low risk for ankle surgery . He may hold his plavix for 5 days prior to surgery  Restart as soon as possible after the surgery

## 2015-11-15 NOTE — Telephone Encounter (Signed)
Clearance placed in HIM for faxing 

## 2015-11-15 NOTE — Telephone Encounter (Signed)
New message        Request for surgical clearance:  What type of surgery is being performed? Ankle surgery When is this surgery scheduled? 11-27-15 1. Are there any medications that need to be held prior to surgery and how long? Not sure if any meds need to be held also need cardiac clearance  Name of physician performing surgery? Dr Elita Quickowen What is your office phone and fax number? Fax [phone]

## 2015-11-19 ENCOUNTER — Other Ambulatory Visit: Payer: Self-pay

## 2015-11-19 MED ORDER — CLOPIDOGREL BISULFATE 75 MG PO TABS: 75.0000 mg | ORAL_TABLET | Freq: Every day | ORAL | 3 refills | Status: DC

## 2015-11-20 ENCOUNTER — Encounter (HOSPITAL_BASED_OUTPATIENT_CLINIC_OR_DEPARTMENT_OTHER): Payer: Self-pay | Admitting: *Deleted

## 2015-11-21 ENCOUNTER — Other Ambulatory Visit: Payer: Self-pay | Admitting: Physician Assistant

## 2015-11-21 DIAGNOSIS — S82402A Unspecified fracture of shaft of left fibula, initial encounter for closed fracture: Secondary | ICD-10-CM

## 2015-11-22 ENCOUNTER — Telehealth: Payer: Self-pay

## 2015-11-22 NOTE — H&P (Signed)
Chris [name] is an 60 y.o. male.    Chief Complaint: Left ankle fracture  HPI: Patient presents with a chief complaint of left ankle pain.  Patient states that he injured himself last Friday when he was stepped awkwardly off a ladder and fell breaking is ankle.  Patient went to an urgent care where he had x-rays and was placed into a splint.  Unfortunately, the splint did cause irritation and he was transferred to a walking boot.  He is been on crutches for ambulation.  He is instructed to follow-up with an orthopedist to discuss his options going forward.  Unfortunately, the patient has developed some significant blistering over the medial aspect of the ankle.  He also has a history of an ingrown toenail over the left great toe.  He denies any current fevers chills night sweats or other signs of infection.  Past Medical History:  Diagnosis Date  . Acute MI, inferior wall, initial episode of care [facility]) 2009  . Allergy   . Coronary atherosclerosis of unspecified type of vessel, native or graft   . Other and unspecified hyperlipidemia   . Sleep apnea     Past Surgical History:  Procedure Laterality Date  . CHOLECYSTECTOMY    . CORONARY STENT PLACEMENT  2009    2.75 x 28 mm Promus stent to the RCA    Family History  Problem Relation Age of Onset  . Heart disease Mother   . Prostate cancer Brother    Social History:  reports that he quit smoking about 8 years ago. He has never used smokeless tobacco. He reports that he drinks alcohol. He reports that he does not use drugs.  Allergies:  Allergies  Allergen Reactions  . Ace Inhibitors Rash    REACTION: Rash  . Ropinirole Hcl Rash    No prescriptions prior to admission.    No results found for this or any previous visit (from the past 48 hour(s)). No results found.  Review of Systems  Constitutional: Negative.   HENT: Negative.   Eyes: Negative.   Respiratory: Negative.   Cardiovascular: Negative.        Htn and pt on  blood thinners  Gastrointestinal: Negative.   Genitourinary: Negative.   Musculoskeletal: Positive for joint pain.  Skin: Negative.   Neurological: Negative.   Endo/Heme/Allergies: Negative.   Psychiatric/Behavioral: Negative.     Height 5\' 9"  (1.753 m), weight 98.9 kg (218 lb). Physical Exam  Constitutional: He is oriented to person, place, and time. He appears well-developed and well-nourished.  HENT:  Head: Normocephalic and atraumatic.  Eyes: Pupils are equal, round, and reactive to light.  Neck: Normal range of motion. Neck supple.  Cardiovascular: Intact distal pulses.   Respiratory: Effort normal.  Musculoskeletal: He exhibits edema and tenderness.  the patient's left ankle has significant swelling and ecchymosis.  He does have a large fracture blister over the medial malleolar region.  He also has several blisters extending up the lower medial tibial region.  Tenderness over both the medial and lateral malleolar regions.  His calves are soft and nontender.  He is neurovascularly intact distally.  Patient does have a ingrown toenail that is not actively draining.  Neurological: He is alert and oriented to person, place, and time.  Skin: Skin is warm and dry.  Psychiatric: He has a normal mood and affect. His behavior is normal. Judgment and thought content normal.     Assessment/Plan  Assess: Trimalleolar fracture with minimal displacement  Plan:  By nature of his injury this fracture is unstable.  He will need open reduction internal fixation the future.  We will need the patient's tissue to improve prior to surgical intervention.  We will like the patient to use rest elevation and compression over the next 5 days to have the swelling subside and also help with the fracture blisters.  He is to follow-up next Tuesday for repeat examination.  Continue with the fracture sprain boot and crutches.  He is nonweightbearing on the left leg.  He is given a prescription for Keflex 500 mg 3  times daily #15.  He is to call with any issues.  Elisia Stepp R, PA-C 11/22/2015, 1:31 PM

## 2015-11-22 NOTE — Telephone Encounter (Signed)
Please f/u

## 2015-11-22 NOTE — Telephone Encounter (Signed)
Pt states he is having surgery on wed. And has been out of rx/// please  traMADol (ULTRAM) 50 MG tablet 30 tablet   Please call when ready for pick up [phone]

## 2015-11-22 NOTE — Telephone Encounter (Signed)
PATIENT WOULD LIKE TO KNOW IF WE CAN PRESCRIBE HIM SOME PAIN MEDICATION FOR HIS BROKEN FOOT UNTIL HIS SURGERY ON Wednesday 11/27/2015. HE HAS TRIED ALL DAY TO GET IN TOUCH WITH [facility]  AND HAS NOT BEEN SUCCESSFUL. WHEN HE TRIED TO CALL BACK AGAIN ABOUT 4:00 THIS AFTERNOON, THEY HAVE ALREADY GONE. HE SAID HE CAN'T GO UNTIL Tuesday WHEN THEY REOPEN WITHOUT SOMETHING FOR PAIN . BEST PHONE [phone] (CELL)  PHARMACY CHOICE IS [facility] AT [address].  MBC

## 2015-11-25 DIAGNOSIS — S8262XA Displaced fracture of lateral malleolus of left fibula, initial encounter for closed fracture: Secondary | ICD-10-CM | POA: Diagnosis present

## 2015-11-26 NOTE — Telephone Encounter (Signed)
Please contact the patient - he has surgery scheduled for tomorrow so the ortho needs to be writing this for him --

## 2015-11-26 NOTE — Anesthesia Preprocedure Evaluation (Signed)
Anesthesia Evaluation  Patient identified by MRN, date of birth, ID band Patient awake    Reviewed: Allergy & Precautions, NPO status , Patient's Chart, lab work & pertinent test results  History of Anesthesia Complications Negative for: history of anesthetic complications  Airway Mallampati: II  TM Distance: >3 FB Neck ROM: Full    Dental no notable dental hx. (+) Dental Advisory Given   Pulmonary sleep apnea , former smoker,    Pulmonary exam normal breath sounds clear to auscultation       Cardiovascular + CAD, + Past MI and + Cardiac Stents (stent in 2009, no problems since)  Normal cardiovascular exam Rhythm:Regular Rate:Normal     Neuro/Psych negative neurological ROS  negative psych ROS   GI/Hepatic negative GI ROS, Neg liver ROS,   Endo/Other  obesity  Renal/GU negative Renal ROS  negative genitourinary   Musculoskeletal negative musculoskeletal ROS (+)   Abdominal   Peds negative pediatric ROS (+)  Hematology negative hematology ROS (+)   Anesthesia Other Findings   Reproductive/Obstetrics negative OB ROS                             Anesthesia Physical Anesthesia Plan  ASA: II  Anesthesia Plan: General   Post-op Pain Management: GA combined w/ Regional for post-op pain   Induction: Intravenous  Airway Management Planned: LMA  Additional Equipment:   Intra-op Plan:   Post-operative Plan: Extubation in OR  Informed Consent: I have reviewed the patients History and Physical, chart, labs and discussed the procedure including the risks, benefits and alternatives for the proposed anesthesia with the patient or authorized representative who has indicated his/her understanding and acceptance.   Dental advisory given  Plan Discussed with: CRNA  Anesthesia Plan Comments:         Anesthesia Quick Evaluation

## 2015-11-27 ENCOUNTER — Encounter (HOSPITAL_BASED_OUTPATIENT_CLINIC_OR_DEPARTMENT_OTHER): Payer: Self-pay | Admitting: Anesthesiology

## 2015-11-27 ENCOUNTER — Ambulatory Visit (HOSPITAL_BASED_OUTPATIENT_CLINIC_OR_DEPARTMENT_OTHER)
Admission: RE | Admit: 2015-11-27 | Discharge: 2015-11-27 | Disposition: A | Discharge status: Home / Self Care | Payer: BLUE CROSS/BLUE SHIELD | Source: Ambulatory Visit | Attending: Orthopedic Surgery | Admitting: Orthopedic Surgery

## 2015-11-27 ENCOUNTER — Ambulatory Visit (HOSPITAL_BASED_OUTPATIENT_CLINIC_OR_DEPARTMENT_OTHER): Payer: BLUE CROSS/BLUE SHIELD | Admitting: Anesthesiology

## 2015-11-27 ENCOUNTER — Encounter (HOSPITAL_BASED_OUTPATIENT_CLINIC_OR_DEPARTMENT_OTHER)
Admission: RE | Disposition: A | Discharge status: Home / Self Care | Payer: Self-pay | Source: Ambulatory Visit | Attending: Orthopedic Surgery

## 2015-11-27 DIAGNOSIS — S8261XA Displaced fracture of lateral malleolus of right fibula, initial encounter for closed fracture: Secondary | ICD-10-CM | POA: Diagnosis not present

## 2015-11-27 DIAGNOSIS — Z87891 Personal history of nicotine dependence: Secondary | ICD-10-CM | POA: Diagnosis not present

## 2015-11-27 DIAGNOSIS — Z955 Presence of coronary angioplasty implant and graft: Secondary | ICD-10-CM | POA: Insufficient documentation

## 2015-11-27 DIAGNOSIS — S8262XA Displaced fracture of lateral malleolus of left fibula, initial encounter for closed fracture: Secondary | ICD-10-CM | POA: Diagnosis present

## 2015-11-27 DIAGNOSIS — G8918 Other acute postprocedural pain: Secondary | ICD-10-CM | POA: Diagnosis not present

## 2015-11-27 DIAGNOSIS — E669 Obesity, unspecified: Secondary | ICD-10-CM | POA: Insufficient documentation

## 2015-11-27 DIAGNOSIS — W11XXXA Fall on and from ladder, initial encounter: Secondary | ICD-10-CM | POA: Diagnosis not present

## 2015-11-27 DIAGNOSIS — I252 Old myocardial infarction: Secondary | ICD-10-CM | POA: Insufficient documentation

## 2015-11-27 DIAGNOSIS — I2510 Atherosclerotic heart disease of native coronary artery without angina pectoris: Secondary | ICD-10-CM | POA: Diagnosis not present

## 2015-11-27 DIAGNOSIS — M25572 Pain in left ankle and joints of left foot: Secondary | ICD-10-CM | POA: Diagnosis not present

## 2015-11-27 DIAGNOSIS — E785 Hyperlipidemia, unspecified: Secondary | ICD-10-CM | POA: Diagnosis not present

## 2015-11-27 DIAGNOSIS — Z6830 Body mass index (BMI) 30.0-30.9, adult: Secondary | ICD-10-CM | POA: Insufficient documentation

## 2015-11-27 DIAGNOSIS — G4730 Sleep apnea, unspecified: Secondary | ICD-10-CM | POA: Diagnosis not present

## 2015-11-27 DIAGNOSIS — S82852A Displaced trimalleolar fracture of left lower leg, initial encounter for closed fracture: Secondary | ICD-10-CM | POA: Diagnosis not present

## 2015-11-27 HISTORY — PX: ORIF ANKLE FRACTURE: SHX5408

## 2015-11-27 SURGERY — OPEN REDUCTION INTERNAL FIXATION (ORIF) ANKLE FRACTURE
Anesthesia: General | Site: Ankle | Laterality: Left

## 2015-11-27 MED ORDER — PROPOFOL 10 MG/ML IV BOLUS
INTRAVENOUS | Status: AC
  Filled 2015-11-27: qty 20

## 2015-11-27 MED ORDER — FENTANYL CITRATE (PF) 100 MCG/2ML IJ SOLN
INTRAMUSCULAR | Status: AC
  Filled 2015-11-27: qty 2

## 2015-11-27 MED ORDER — ONDANSETRON HCL 4 MG/2ML IJ SOLN
INTRAMUSCULAR | Status: AC
  Filled 2015-11-27: qty 2

## 2015-11-27 MED ORDER — FENTANYL CITRATE (PF) 100 MCG/2ML IJ SOLN
50.0000 ug | INTRAMUSCULAR | Status: DC | PRN
  Administered 2015-11-27: 100 ug via INTRAVENOUS

## 2015-11-27 MED ORDER — CHLORHEXIDINE GLUCONATE 4 % EX LIQD: 60.0000 mL | Freq: Once | CUTANEOUS | Status: DC

## 2015-11-27 MED ORDER — GLYCOPYRROLATE 0.2 MG/ML IV SOSY
PREFILLED_SYRINGE | INTRAVENOUS | Status: AC
  Filled 2015-11-27: qty 3

## 2015-11-27 MED ORDER — ONDANSETRON HCL 4 MG PO TABS
4.0000 mg | ORAL_TABLET | Freq: Four times a day (QID) | ORAL | Status: DC | PRN
Start: 2015-11-27 — End: 2015-11-27

## 2015-11-27 MED ORDER — PROPOFOL 10 MG/ML IV BOLUS
INTRAVENOUS | Status: DC | PRN
  Administered 2015-11-27: 50 mg via INTRAVENOUS
  Administered 2015-11-27: 300 mg via INTRAVENOUS

## 2015-11-27 MED ORDER — FENTANYL CITRATE (PF) 100 MCG/2ML IJ SOLN: 25.0000 ug | INTRAMUSCULAR | Status: DC | PRN

## 2015-11-27 MED ORDER — LACTATED RINGERS IV SOLN
INTRAVENOUS | Status: DC
  Administered 2015-11-27 (×2): via INTRAVENOUS

## 2015-11-27 MED ORDER — MIDAZOLAM HCL 5 MG/5ML IJ SOLN
INTRAMUSCULAR | Status: DC | PRN
  Administered 2015-11-27: 2 mg via INTRAVENOUS

## 2015-11-27 MED ORDER — BUPIVACAINE HCL (PF) 0.5 % IJ SOLN
INTRAMUSCULAR | Status: AC
  Filled 2015-11-27: qty 30

## 2015-11-27 MED ORDER — KETOROLAC TROMETHAMINE 30 MG/ML IJ SOLN
INTRAMUSCULAR | Status: DC | PRN
  Administered 2015-11-27: 30 mg via INTRAVENOUS

## 2015-11-27 MED ORDER — MIDAZOLAM HCL 2 MG/2ML IJ SOLN
INTRAMUSCULAR | Status: AC
  Filled 2015-11-27: qty 2

## 2015-11-27 MED ORDER — DEXAMETHASONE SODIUM PHOSPHATE 10 MG/ML IJ SOLN
INTRAMUSCULAR | Status: DC | PRN
  Administered 2015-11-27: 10 mg via INTRAVENOUS

## 2015-11-27 MED ORDER — KETOROLAC TROMETHAMINE 30 MG/ML IJ SOLN
INTRAMUSCULAR | Status: AC
  Filled 2015-11-27: qty 1

## 2015-11-27 MED ORDER — METOCLOPRAMIDE HCL 5 MG/ML IJ SOLN: 5.0000 mg | Freq: Three times a day (TID) | INTRAMUSCULAR | Status: DC | PRN

## 2015-11-27 MED ORDER — FENTANYL CITRATE (PF) 100 MCG/2ML IJ SOLN
INTRAMUSCULAR | Status: DC | PRN
  Administered 2015-11-27 (×2): 25 ug via INTRAVENOUS
  Administered 2015-11-27 (×2): 50 ug via INTRAVENOUS

## 2015-11-27 MED ORDER — DEXTROSE-NACL 5-0.45 % IV SOLN: INTRAVENOUS | Status: DC

## 2015-11-27 MED ORDER — GLYCOPYRROLATE 0.2 MG/ML IJ SOLN
0.2000 mg | Freq: Once | INTRAMUSCULAR | Status: AC | PRN
  Administered 2015-11-27: 0.2 mg via INTRAVENOUS

## 2015-11-27 MED ORDER — ONDANSETRON HCL 4 MG/2ML IJ SOLN: 4.0000 mg | Freq: Four times a day (QID) | INTRAMUSCULAR | Status: DC | PRN

## 2015-11-27 MED ORDER — SCOPOLAMINE 1 MG/3DAYS TD PT72: 1.0000 | MEDICATED_PATCH | Freq: Once | TRANSDERMAL | Status: DC | PRN

## 2015-11-27 MED ORDER — MIDAZOLAM HCL 2 MG/2ML IJ SOLN
1.0000 mg | INTRAMUSCULAR | Status: DC | PRN
  Administered 2015-11-27: 2 mg via INTRAVENOUS

## 2015-11-27 MED ORDER — CEFAZOLIN SODIUM-DEXTROSE 2-4 GM/100ML-% IV SOLN
2.0000 g | INTRAVENOUS | Status: AC
  Administered 2015-11-27: 2 g via INTRAVENOUS

## 2015-11-27 MED ORDER — ONDANSETRON HCL 4 MG/2ML IJ SOLN
INTRAMUSCULAR | Status: DC | PRN
  Administered 2015-11-27: 4 mg via INTRAVENOUS

## 2015-11-27 MED ORDER — HYDROCODONE-ACETAMINOPHEN 5-325 MG PO TABS: 1.0000 | ORAL_TABLET | Freq: Four times a day (QID) | ORAL | 0 refills | Status: DC | PRN

## 2015-11-27 MED ORDER — DEXAMETHASONE SODIUM PHOSPHATE 10 MG/ML IJ SOLN
INTRAMUSCULAR | Status: AC
  Filled 2015-11-27: qty 1

## 2015-11-27 MED ORDER — CEFAZOLIN SODIUM-DEXTROSE 2-4 GM/100ML-% IV SOLN
INTRAVENOUS | Status: AC
  Filled 2015-11-27: qty 100

## 2015-11-27 MED ORDER — LIDOCAINE HCL (CARDIAC) 20 MG/ML IV SOLN
INTRAVENOUS | Status: DC | PRN
  Administered 2015-11-27: 30 mg via INTRAVENOUS

## 2015-11-27 MED ORDER — METOCLOPRAMIDE HCL 5 MG PO TABS: 5.0000 mg | ORAL_TABLET | Freq: Three times a day (TID) | ORAL | Status: DC | PRN

## 2015-11-27 MED ORDER — LIDOCAINE 2% (20 MG/ML) 5 ML SYRINGE
INTRAMUSCULAR | Status: AC
  Filled 2015-11-27: qty 5

## 2015-11-27 MED ORDER — ONDANSETRON HCL 4 MG/2ML IJ SOLN: 4.0000 mg | Freq: Once | INTRAMUSCULAR | Status: DC | PRN

## 2015-11-27 MED ORDER — BUPIVACAINE-EPINEPHRINE (PF) 0.5% -1:200000 IJ SOLN
INTRAMUSCULAR | Status: DC | PRN
  Administered 2015-11-27: 30 mL

## 2015-11-27 SURGICAL SUPPLY — 84 items
BANDAGE ACE 3X5.8 VEL STRL LF (GAUZE/BANDAGES/DRESSINGS) IMPLANT
BANDAGE ACE 4X5 VEL STRL LF (GAUZE/BANDAGES/DRESSINGS) ×1 IMPLANT
BANDAGE ACE 6X5 VEL STRL LF (GAUZE/BANDAGES/DRESSINGS) ×1 IMPLANT
BANDAGE ESMARK 6X9 LF (GAUZE/BANDAGES/DRESSINGS) ×1 IMPLANT
BIT DRILL 2.5X2.75 QC CALB (BIT) ×1 IMPLANT
BIT DRILL 3.5X5.5 QC CALB (BIT) ×1 IMPLANT
BLADE SURG 10 STRL SS (BLADE) ×1 IMPLANT
BLADE SURG 15 STRL LF DISP TIS (BLADE) ×2 IMPLANT
BLADE SURG 15 STRL SS (BLADE) ×2
BNDG CMPR 9X6 STRL LF SNTH (GAUZE/BANDAGES/DRESSINGS) ×1
BNDG ESMARK 6X9 LF (GAUZE/BANDAGES/DRESSINGS) ×2
BOOT STEPPER DURA LG (SOFTGOODS) IMPLANT
BOOT STEPPER DURA MED (SOFTGOODS) IMPLANT
BOOT STEPPER DURA SM (SOFTGOODS) IMPLANT
CANISTER SUCT 1200ML W/VALVE (MISCELLANEOUS) ×2 IMPLANT
COVER BACK TABLE 60X90IN (DRAPES) ×2 IMPLANT
CUFF TOURNIQUET SINGLE 24IN (TOURNIQUET CUFF) IMPLANT
CUFF TOURNIQUET SINGLE 34IN LL (TOURNIQUET CUFF) IMPLANT
DECANTER SPIKE VIAL GLASS SM (MISCELLANEOUS) IMPLANT
DRAPE EXTREMITY T 121X128X90 (DRAPE) ×2 IMPLANT
DRAPE IMP U-DRAPE 54X76 (DRAPES) ×2 IMPLANT
DRAPE OEC MINIVIEW 54X84 (DRAPES) ×2 IMPLANT
DRAPE SURG 17X23 STRL (DRAPES) ×1 IMPLANT
DRAPE U-SHAPE 47X51 STRL (DRAPES) ×1 IMPLANT
DURAPREP 26ML APPLICATOR (WOUND CARE) ×2 IMPLANT
ELECT REM PT RETURN 9FT ADLT (ELECTROSURGICAL) ×2
ELECTRODE REM PT RTRN 9FT ADLT (ELECTROSURGICAL) ×1 IMPLANT
FIXATION ZIPTIGHT ANKLE SNDSMS (Ankle) IMPLANT
GAUZE SPONGE 4X4 12PLY STRL (GAUZE/BANDAGES/DRESSINGS) ×2 IMPLANT
GAUZE SPONGE 4X4 16PLY XRAY LF (GAUZE/BANDAGES/DRESSINGS) IMPLANT
GAUZE XEROFORM 1X8 LF (GAUZE/BANDAGES/DRESSINGS) ×2 IMPLANT
GLOVE BIO SURGEON STRL SZ7.5 (GLOVE) ×2 IMPLANT
GLOVE BIO SURGEON STRL SZ8.5 (GLOVE) ×2 IMPLANT
GLOVE BIOGEL PI IND STRL 7.0 (GLOVE) IMPLANT
GLOVE BIOGEL PI IND STRL 8 (GLOVE) ×2 IMPLANT
GLOVE BIOGEL PI IND STRL 9 (GLOVE) ×1 IMPLANT
GLOVE BIOGEL PI INDICATOR 7.0 (GLOVE) ×2
GLOVE BIOGEL PI INDICATOR 8 (GLOVE) ×2
GLOVE BIOGEL PI INDICATOR 9 (GLOVE) ×1
GLOVE ECLIPSE 6.5 STRL STRAW (GLOVE) ×1 IMPLANT
GOWN STRL REUS W/ TWL LRG LVL3 (GOWN DISPOSABLE) ×2 IMPLANT
GOWN STRL REUS W/TWL LRG LVL3 (GOWN DISPOSABLE) ×4
GOWN STRL REUS W/TWL XL LVL3 (GOWN DISPOSABLE) ×2 IMPLANT
NEEDLE HYPO 22GX1.5 SAFETY (NEEDLE) IMPLANT
NS IRRIG 1000ML POUR BTL (IV SOLUTION) ×2 IMPLANT
PACK BASIN DAY SURGERY FS (CUSTOM PROCEDURE TRAY) ×2 IMPLANT
PAD CAST 3X4 CTTN HI CHSV (CAST SUPPLIES) IMPLANT
PAD CAST 4YDX4 CTTN HI CHSV (CAST SUPPLIES) IMPLANT
PADDING CAST ABS 4INX4YD NS (CAST SUPPLIES) ×1
PADDING CAST ABS COTTON 4X4 ST (CAST SUPPLIES) ×1 IMPLANT
PADDING CAST COTTON 3X4 STRL (CAST SUPPLIES)
PADDING CAST COTTON 4X4 STRL (CAST SUPPLIES)
PADDING CAST COTTON 6X4 STRL (CAST SUPPLIES) ×1 IMPLANT
PADDING UNDERCAST 2 STRL (CAST SUPPLIES)
PADDING UNDERCAST 2X4 STRL (CAST SUPPLIES) IMPLANT
PENCIL BUTTON HOLSTER BLD 10FT (ELECTRODE) ×2 IMPLANT
PLATE ACE 100DEG 8HOLE (Plate) ×1 IMPLANT
SCREW CORTICAL 3.5MM  16MM (Screw) ×3 IMPLANT
SCREW CORTICAL 3.5MM 14MM (Screw) ×2 IMPLANT
SCREW CORTICAL 3.5MM 16MM (Screw) IMPLANT
SCREW CORTICAL 3.5MM 18MM (Screw) ×3 IMPLANT
SLEEVE SCD COMPRESS KNEE MED (MISCELLANEOUS) ×1 IMPLANT
SPLINT FAST PLASTER 5X30 (CAST SUPPLIES)
SPLINT PLASTER CAST FAST 5X30 (CAST SUPPLIES) IMPLANT
SPONGE LAP 18X18 X RAY DECT (DISPOSABLE) ×1 IMPLANT
SPONGE LAP 4X18 X RAY DECT (DISPOSABLE) IMPLANT
STAPLER VISISTAT (STAPLE) IMPLANT
STAPLER VISISTAT 35W (STAPLE) IMPLANT
SUCTION FRAZIER HANDLE 10FR (MISCELLANEOUS) ×1
SUCTION TUBE FRAZIER 10FR DISP (MISCELLANEOUS) ×1 IMPLANT
SUT ETHILON 3 0 PS 1 (SUTURE) IMPLANT
SUT ETHILON 4 0 PS 2 18 (SUTURE) IMPLANT
SUT TICRON 1 T 12 (SUTURE) IMPLANT
SUT VIC AB 2-0 SH 27 (SUTURE)
SUT VIC AB 2-0 SH 27XBRD (SUTURE) IMPLANT
SUT VIC AB 3-0 SH 27 (SUTURE) ×4
SUT VIC AB 3-0 SH 27X BRD (SUTURE) ×1 IMPLANT
SYR BULB 3OZ (MISCELLANEOUS) ×2 IMPLANT
SYR CONTROL 10ML LL (SYRINGE) IMPLANT
TOWEL OR 17X24 6PK STRL BLUE (TOWEL DISPOSABLE) ×2 IMPLANT
TUBE CONNECTING 20X1/4 (TUBING) ×2 IMPLANT
UNDERPAD 30X30 (UNDERPADS AND DIAPERS) ×2 IMPLANT
YANKAUER SUCT BULB TIP NO VENT (SUCTIONS) ×1 IMPLANT
ZIPTIGHT ANKLE SYNODESMOSS FIX (Ankle) ×2 IMPLANT

## 2015-11-27 NOTE — Anesthesia Procedure Notes (Signed)
Anesthesia Regional Block:  Popliteal block (saphenous)  Pre-Anesthetic Checklist: ,, timeout performed, Correct Patient, Correct Site, Correct Laterality, Correct Procedure, Correct Position, site marked, Risks and benefits discussed,  Surgical consent,  Pre-op evaluation,  At surgeon's request and post-op pain management  Laterality: Left  Prep: Maximum Sterile Barrier Precautions used, chloraprep       Needles:  Injection technique: Single-shot  Needle Type: Echogenic Stimulator Needle     Needle Length: 10cm 10 cm Needle Gauge: 21 G    Additional Needles:  Procedures: ultrasound guided (picture in chart) Popliteal block (saphenous) Narrative:  Injection made incrementally with aspirations every 5 mL.  Performed by: Personally  Anesthesiologist: Quintara Bost, Corrie DandyMARY  Additional Notes: Patient tolerated the procedure well without complications

## 2015-11-27 NOTE — Anesthesia Postprocedure Evaluation (Signed)
Anesthesia Post Note  Patient: Chris [name]  Procedure(s) Performed: Procedure(s) (LRB): OPEN REDUCTION INTERNAL FIXATION (ORIF) ANKLE FRACTURE (Left)  Patient location during evaluation: PACU Anesthesia Type: General Level of consciousness: awake and alert Pain management: pain level controlled Vital Signs Assessment: post-procedure vital signs reviewed and stable Respiratory status: spontaneous breathing, nonlabored ventilation, respiratory function stable and patient connected to nasal cannula oxygen Cardiovascular status: blood pressure returned to baseline and stable Postop Assessment: no signs of nausea or vomiting Anesthetic complications: no    Last Vitals:  Vitals:   11/27/15 1215 11/27/15 1221  BP: 129/84   Pulse: 69 70  Resp: 16 12  Temp:      Last Pain:  Vitals:   11/27/15 1215  TempSrc:   PainSc: 0-No pain    LLE Motor Response: No movement due to regional block (11/27/15 1215) LLE Sensation: No sensation (absent) (11/27/15 1215)          Kylani Wires JENNETTE

## 2015-11-27 NOTE — Op Note (Addendum)
Preoperative diagnosis: Left lateral malleolus fracture with unstable ankle  Postoperative diagnosis: Same  Procedure: Open reduction internal fixation of Left ankle with 3 anterior to posterior Dupuy 3.5 mm lag screws, and a 8 hole one third tubular plate   Surgeon: Feliberto Gottron. Turner Daniels M.D.  First assistant: Tomi Likens. Gaylene Brooks  (present throughout entire procedure and necessary for timely completion of the procedure) Anesthetic: Gen. LMA  Estimated blood loss: Minimal  Tourniquet time: 29 minutes  Indications for procedure: Patient slipped and fell 18 days ago, was seen at the emergency room and diagnosed with a trimalleolar fracture pronation external rotation type IV. The fibula fracture was oblique and started 4 cm above the ankle joint itself. At that time there is minimal widening of the mortise joint he was placed in a Cam Walker boot given crutches and followed up in our clinic a few days later. At that time he had large fracture blisters medially and less so anteriorly with significant swelling and ecchymosis. X-rays did show continued minimal widening of the mortise a small fracture off the very tip of the medial malleolus and a small fracture off the posterior malleolus 1-2 mm minimally displaced. We elected to keep the limb elevated at rest to allow the fracture blisters to decompress and the skin to stabilize which it did. Now that it is safe to make an incision over the lateral side of the ankle he is prepared for surgical intervention. Risks and benefits of surgery been discussed with the patient and she is prepared for intervention to decrease pain and improve function.    Description of procedure: The patient was identified by arm band receive preoperative IV antibiotics in the holding area at, day surgery Center. She then received a right popliteal block, was taken to the operating room where the appropriate anesthetic monitors were attached and general LMA anesthesia was induced. A  tourniquet was applied high to the Left calf and the Left lower from a prepped and draped in the usual sterile fashion from the toes to the tourniquet. A time out procedure was performed. The limb was then wrapped with an Esmarch bandage and the tourniquet inflated to 300 mm of mercury. We began the operation by making a longitudinal incision centered over the oblique fracture going proximally and distally for 3 cm each. Small bleeders in the subcutaneous tissue identified and cauterized and using tenotomy scissors we dissected down to the fracture site and reflected the periosteum anteriorly and posteriorly exposing the fracture. Because the fracture was 7 weeks old we also removed some early callus. We then reduced the fracture with a lion jaw clamp and a calibrated tenaculum. This allowed placement of 3 anterior to posterior 3.5 mm cortical interfrag screws from the DePuy small fragment set. C-arm images were then obtained showing excellent reduction of the fracture. We then applied a 8 hole DePuy one third tubular plate with 3 proximal and 2 distal distal bicortical screws. We then performed a lateral drawer test with the ankle in neutral flexion and the mortise was stable. The most distal screw hole was then filled with a zip tie construct to further stabilize the mortise. Final C-arm images were obtained and saved. The tourniquet was let down small bleeders identified and cauterized, we then closed in layers with 3-0 Vicryl suture subcutaneously and subcuticular. A dressing of 0 from 4 x 4 dressing sponges web roll Ace wrap and a Cam Walker boot was then applied. The patient was then awakened extubated and taken  to the recovery without difficulty. He will be 20-30 pounds weight bearing, until follow-up in 2 weeks

## 2015-11-27 NOTE — Transfer of Care (Signed)
Immediate Anesthesia Transfer of Care Note  Patient: Chris [name]  Procedure(s) Performed: Procedure(s): OPEN REDUCTION INTERNAL FIXATION (ORIF) ANKLE FRACTURE (Left)  Patient Location: PACU  Anesthesia Type:GA combined with regional for post-op pain  Level of Consciousness: awake and patient cooperative  Airway & Oxygen Therapy: Patient Spontanous Breathing and Patient connected to face mask oxygen  Post-op Assessment: Report given to RN and Post -op Vital signs reviewed and stable  Post vital signs: Reviewed and stable  Last Vitals:  Vitals:   11/27/15 0915 11/27/15 0916  BP: 133/69   Pulse: 71 67  Resp: 12 15  Temp:      Last Pain:  Vitals:   11/27/15 0841  TempSrc: Oral  PainSc: 2          Complications: No apparent anesthesia complications

## 2015-11-27 NOTE — Progress Notes (Signed)
Assisted Dr. Karie SchwalbeMary Judd with left, ultrasound guided, popliteal/saphenous block. Side rails up, monitors on throughout procedure. See vital signs in flow sheet. Tolerated Procedure well.

## 2015-11-27 NOTE — Discharge Instructions (Addendum)
Ankle Fracture A fracture is a break in a bone. A cast or splint may be used to protect the ankle and heal the break. Sometimes, surgery is needed. HOME CARE  Use crutches as told by your doctor. It is very important that you use your crutches correctly.  Do not put weight or pressure on the injured ankle until told by your doctor.  Keep your ankle raised (elevated) when sitting or lying down.  Apply ice to the ankle:  Put ice in a plastic bag.  Place a towel between your cast and the bag.  Leave the ice on for 20 minutes, 2-3 times a day.  If you have a plaster or fiberglass cast:  Do not try to scratch under the cast with any objects.  Check the skin around the cast every day. You may put lotion on red or sore areas.  Keep your cast dry and clean.  If you have a plaster splint:  Wear the splint as told by your doctor.  You can loosen the elastic around the splint if your toes get numb, tingle, or turn cold or blue.  Do not put pressure on any part of your cast or splint. It may break. Rest your plaster splint or cast only on a pillow the first 24 hours until it is fully hardened.  Cover your cast or splint with a plastic bag during showers.  Do not lower your cast or splint into water.  Take medicine as told by your doctor.  Do not drive until your doctor says it is safe.  Follow-up with your doctor as told. It is very important that you go to your follow-up visits. GET HELP IF: The swelling and discomfort gets worse.  GET HELP RIGHT AWAY IF:   Your splint or cast breaks.  You continue to have very bad pain.  You have new pain or swelling after your splint or cast was put on.  Your skin or toes below the injured ankle:  Turn blue or gray.  Feel cold, numb, or you cannot feel them.  There is a bad smell or yellowish white fluid (pus) coming from under the splint or cast. MAKE SURE YOU:   Understand these instructions.  Will watch your  condition.  Will get help right away if you are not doing well or get worse.   This information is not intended to replace advice given to you by your health care provider. Make sure you discuss any questions you have with your health care provider.   Document Released: 01/04/2009 Document Revised: 12/28/2012 Document Reviewed: 10/06/2012 Elsevier Interactive Patient Education 2016 [employer]   Post Anesthesia Home Care Instructions  Activity: Get plenty of rest for the remainder of the day. A responsible adult should stay with you for 24 hours following the procedure.  For the next 24 hours, DO NOT: -Drive a car -Advertising copywriterperate machinery -Drink alcoholic beverages -Take any medication unless instructed by your physician -Make any legal decisions or sign important papers.  Meals: Start with liquid foods such as gelatin or soup. Progress to regular foods as tolerated. Avoid greasy, spicy, heavy foods. If nausea and/or vomiting occur, drink only clear liquids until the nausea and/or vomiting subsides. Call your physician if vomiting continues.  Special Instructions/Symptoms: Your throat may feel dry or sore from the anesthesia or the breathing tube placed in your throat during surgery. If this causes discomfort, gargle with warm salt water. The discomfort should disappear within 24 hours.  If you  had a scopolamine patch placed behind your ear for the management of post- operative nausea and/or vomiting:  1. The medication in the patch is effective for 72 hours, after which it should be removed.  Wrap patch in a tissue and discard in the trash. Wash hands thoroughly with soap and water. 2. You may remove the patch earlier than 72 hours if you experience unpleasant side effects which may include dry mouth, dizziness or visual disturbances. 3. Avoid touching the patch. Wash your hands with soap and water after contact with the patch.   Regional Anesthesia Blocks  1. Numbness or the  inability to move the "blocked" extremity may last from 3-48 hours after placement. The length of time depends on the medication injected and your individual response to the medication. If the numbness is not going away after 48 hours, call your surgeon.  2. The extremity that is blocked will need to be protected until the numbness is gone and the  Strength has returned. Because you cannot feel it, you will need to take extra care to avoid injury. Because it may be weak, you may have difficulty moving it or using it. You may not know what position it is in without looking at it while the block is in effect.  3. For blocks in the legs and feet, returning to weight bearing and walking needs to be done carefully. You will need to wait until the numbness is entirely gone and the strength has returned. You should be able to move your leg and foot normally before you try and bear weight or walk. You will need someone to be with you when you first try to ensure you do not fall and possibly risk injury.  4. Bruising and tenderness at the needle site are common side effects and will resolve in a few days.  5. Persistent numbness or new problems with movement should be communicated to the surgeon or the [facility] [phone] [facility] [phone]).

## 2015-11-27 NOTE — Interval H&P Note (Signed)
Updated.

## 2015-11-27 NOTE — Anesthesia Procedure Notes (Signed)
Procedure Name: LMA Insertion Date/Time: 11/27/2015 10:24 AM Performed by: Genevieve NorlanderLINKA, Dovey Fatzinger L Pre-anesthesia Checklist: Patient identified, Emergency Drugs available, Suction available, Patient being monitored and Timeout performed Patient Re-evaluated:Patient Re-evaluated prior to inductionOxygen Delivery Method: Circle system utilized Preoxygenation: Pre-oxygenation with 100% oxygen Intubation Type: IV induction Ventilation: Mask ventilation without difficulty LMA: LMA inserted LMA Size: 5.0 Number of attempts: 1 Airway Equipment and Method: Bite block Placement Confirmation: positive ETCO2 Tube secured with: Tape Dental Injury: Teeth and Oropharynx as per pre-operative assessment

## 2015-11-28 ENCOUNTER — Encounter (HOSPITAL_BASED_OUTPATIENT_CLINIC_OR_DEPARTMENT_OTHER): Payer: Self-pay | Admitting: Orthopedic Surgery

## 2015-11-30 ENCOUNTER — Ambulatory Visit (INDEPENDENT_AMBULATORY_CARE_PROVIDER_SITE_OTHER): Payer: BLUE CROSS/BLUE SHIELD | Admitting: Physician Assistant

## 2015-11-30 VITALS — BP 164/82 | HR 70 | Temp 98.1°F | Ht 69.0 in | Wt 216.0 lb

## 2015-11-30 DIAGNOSIS — J392 Other diseases of pharynx: Secondary | ICD-10-CM

## 2015-11-30 NOTE — Patient Instructions (Signed)
     IF you received an x-ray today, you will receive an invoice from [facility]. Please contact [facility] at [phone] with questions or concerns regarding your invoice.   IF you received labwork today, you will receive an invoice from [facility]. Please contact [facility] at [phone] with questions or concerns regarding your invoice.   Our billing staff will not be able to assist you with questions regarding bills from these companies.  You will be contacted with the lab results as soon as they are available. The fastest way to get your results is to activate your My Chart account. Instructions are located on the last page of this paperwork. If you have not heard from us regarding the results in 2 weeks, please contact this office.      

## 2015-11-30 NOTE — Progress Notes (Signed)
11/30/2015 9:33 AM   DOB: 1955/11/22 / MRN: [id number]  SUBJECTIVE:  Chris [name] is a 60 y.o. male presenting for the feeling that his throat closing when he lies flat.  He denies cough.  This has never happened to him before.  He denies fever today.  He recently had ankle surgery under general anesthesia and was intubated.  He thinks he may be having an allergic reaction to Hydrocodone however he denies lip and tongue swelling.    He is allergic to ace inhibitors and ropinirole hcl.   He  has a past medical history of Acute MI, inferior wall, initial episode of care [facility](HCC) (2009); Allergy; Coronary atherosclerosis of unspecified type of vessel, native or graft; Other and unspecified hyperlipidemia; and Sleep apnea.    He  reports that he quit smoking about 8 years ago. He has never used smokeless tobacco. He reports that he drinks alcohol. He reports that he does not use drugs. He  reports that he currently engages in sexual activity. The patient  has a past surgical history that includes Cholecystectomy; Coronary stent placement (2009); and ORIF ankle fracture (Left, 11/27/2015).  His family history includes Heart disease in his mother; Prostate cancer in his brother.  Review of Systems  Constitutional: Negative for chills and fever.  Respiratory: Positive for cough.   Gastrointestinal: Negative for abdominal pain, constipation, diarrhea, heartburn, nausea and vomiting.       Increased burping  Genitourinary: Negative for dysuria.  Musculoskeletal: Negative for myalgias.  Skin: Negative for itching and rash.  Neurological: Negative for dizziness and headaches.  Psychiatric/Behavioral: Negative for depression.    The problem list and medications were reviewed and updated by myself where necessary and exist elsewhere in the encounter.   OBJECTIVE:  BP (!) 164/82 (BP Location: Right Arm, Patient Position: Sitting, Cuff Size: Large)   Pulse 70   Temp 98.1 F (36.7 C) (Oral)   Ht 5'  9" (1.753 m)   Wt 216 lb (98 kg)   BMI 31.90 kg/m   Physical Exam  Constitutional: He is oriented to person, place, and time. He appears well-developed.  HENT:  Mouth/Throat: Uvula is midline, oropharynx is clear and moist and mucous membranes are normal.    Cardiovascular: Normal rate and regular rhythm.   Pulmonary/Chest: Effort normal and breath sounds normal.  Musculoskeletal: Normal range of motion.  Neurological: He is alert and oriented to person, place, and time.  Skin: Skin is warm and dry. No rash noted.  Psychiatric: He has a normal mood and affect.  Vitals reviewed.   No results found for this or any previous visit (from the past 72 hour(s)).  No results found.  ASSESSMENT AND PLAN  Chris [name] was seen today for allergic reaction.  Diagnoses and all orders for this visit:  Throat irritation: I see no signs of an allergic reaction or throat obstruction.  I have reassured him that this will improve with time and advised he go home and get lots of rest and fluids.  His BP is elevated however he has not sleep due to the irritation. Advised he monitor this at home and RTC if consistently greater than 140/90.     The patient is advised to call or return to clinic if he does not see an improvement in symptoms, or to seek the care of the closest emergency department if he worsens with the above plan.   Deliah BostonMichael Janica Eldred, MHS, PA-C [facility]  11/30/2015 9:33 AM

## 2015-12-10 DIAGNOSIS — S82852D Displaced trimalleolar fracture of left lower leg, subsequent encounter for closed fracture with routine healing: Secondary | ICD-10-CM | POA: Diagnosis not present

## 2015-12-11 ENCOUNTER — Other Ambulatory Visit: Payer: Self-pay | Admitting: Cardiovascular Disease

## 2015-12-12 ENCOUNTER — Encounter: Payer: Self-pay | Admitting: Cardiovascular Disease

## 2015-12-30 ENCOUNTER — Ambulatory Visit (INDEPENDENT_AMBULATORY_CARE_PROVIDER_SITE_OTHER): Payer: BLUE CROSS/BLUE SHIELD | Admitting: Cardiovascular Disease

## 2015-12-30 ENCOUNTER — Encounter: Payer: Self-pay | Admitting: Cardiovascular Disease

## 2015-12-30 VITALS — BP 150/90 | HR 66 | Ht 69.0 in | Wt 214.8 lb

## 2015-12-30 DIAGNOSIS — E785 Hyperlipidemia, unspecified: Secondary | ICD-10-CM

## 2015-12-30 DIAGNOSIS — I2510 Atherosclerotic heart disease of native coronary artery without angina pectoris: Secondary | ICD-10-CM | POA: Diagnosis not present

## 2015-12-30 NOTE — Progress Notes (Signed)
Chris [name]  Date of Birth  01-26-1956   Problems: 1. Coronary artery disease- He presented in 2009 with some episodes of chest pain and was found to have an inferior wall myocardial infarction. He underwent successful PTCA and stenting of his mid  right coronary artery using a 2.75 x 28 mm Promus stent. The stent was post dilated using a 3.0 noncompliant balloon.  We then positioned a 3.0 x 15 mm Promus stent in the proximal right coronary artery.  The stent was post dilated using a 3.25 mm noncompliant balloon. 2. Hyperlipidemia 3. Sleep apnea 4. Hypertension     Chris [name] is a 60 year old gentleman. He has a history of coronary artery disease. He presented in 2009 with some episodes of chest pain and was found to have an inferior wall myocardial infarction. He underwent successful PTCA and stenting of his mid  right coronary artery using a 2.75 x 28 mm Promus stent. The stent was post dilated using a 3.0 noncompliant balloon.  We then positioned a 3.0 x 15 mm Promus stent in the proximal right coronary artery.  The stent was post dilated using a 3.25 mm noncompliant balloon.  Pt is doing well.  No angina or dyspnea.    He has not been sleeping well.  He also did not take his meds this am.  April 13, 2012: Chris [name] is doing well from a cardiac standpoint.  Exercising on the treadmill every other day. Complains of constipation.  October 17, 2012:  Thank started having some episodes of chest pain last week.  He had been doing lots of physical activity - played 36 holes of golf a day  for 2 days straight.  He also has been busy doing phyisical work.     He had a stress Myoview study. He walked for 11 minutes on a standard Bruce protocol triple test. A Myoview study revealed an old inferior wall myocardial infarction but was otherwise unremarkable.   June 14, 2013:  He is doing well .Chris [name] Has some back pain and "crick" in his neck.  No CP .  Staying active, walking in the evenings 2.5 miles    Sept. 29, 2015:  Chris [name] is doing ok.  He is not working at [employer].   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  Doing well from a cardiac standpoint.  Still walking quite a bit.    Aug. 29, 2016:  Doing well. Owns his own business.  [name]  No CP , no dyspnea.   Jan. 17, 2017: No angina  Is staying busy Working hard.   Oct. 9, 2017:   Chris [name] is seen today for follow up of his CAD Is healing up from a left ankle injury.  Walking on crutches .   Wears him out.  Has not had any chest pain  Or dyspnea    Current Outpatient Prescriptions on File Prior to Visit  Medication Sig Dispense Refill  . aspirin 81 MG tablet Take 81 mg by mouth daily.     Chris [name] atorvastatin (LIPITOR) 40 MG tablet Take 1 tablet (40 mg total) by mouth daily. 30 tablet 11  . clopidogrel (PLAVIX) 75 MG tablet Take 1 tablet (75 mg total) by mouth daily. 90 tablet 3  . HYDROcodone-acetaminophen (NORCO) 5-325 MG tablet Take 1 tablet by mouth every 6 (six) hours as needed. 30 tablet 0  . metoprolol succinate (TOPROL-XL) 25 MG 24 hr tablet TAKE 1/2 TABLET BY MOUTH 2 TIMES DAILY  30 tablet 11  . Multiple Vitamin (MULTIVITAMIN) tablet Take 1 tablet by mouth daily.    . Omega-3 Fatty Acids (FISH OIL) 1200 MG CAPS Take 2 capsules by mouth 2 (two) times daily at 10 AM and 5 PM.     . pantoprazole (PROTONIX) 40 MG tablet Take 1 tablet (40 mg total) by mouth daily as needed (reflux). 90 tablet 1  . traMADol (ULTRAM) 50 MG tablet Take 1 tablet (50 mg total) by mouth every 8 (eight) hours as needed. 30 tablet 0   No current facility-administered medications on file prior to visit.     Allergies  Allergen Reactions  . Ace Inhibitors Rash    REACTION: Rash  . Ropinirole Hcl Rash    Past Medical History:  Diagnosis Date  . Acute MI, inferior wall, initial episode of care [facility](HCC) 2009  . Allergy   . Coronary atherosclerosis of unspecified type of vessel, native or graft   .  Other and unspecified hyperlipidemia   . Sleep apnea     Past Surgical History:  Procedure Laterality Date  . CHOLECYSTECTOMY    . CORONARY STENT PLACEMENT  2009    2.75 x 28 mm Promus stent to the RCA  . ORIF ANKLE FRACTURE Left 11/27/2015   Procedure: OPEN REDUCTION INTERNAL FIXATION (ORIF) ANKLE FRACTURE;  Surgeon: Gean BirchwoodFrank Rowan, MD;  Location: [facility];  Service: Orthopedics;  Laterality: Left;    History  Smoking Status  . Former Smoker  . Quit date: 08/22/2007  Smokeless Tobacco  . Never Used    History  Alcohol Use  . 0.0 oz/week    Comment: very little    Family History  Problem Relation Age of Onset  . Heart disease Mother   . Prostate cancer Brother     Reviw of Systems:  Reviewed in the HPI.  All other systems are negative.  Physical Exam: BP (!) 150/90 (BP Location: Right Arm, Patient Position: Sitting, Cuff Size: Normal)   Pulse 66   Ht 5\' 9"  (1.753 m)   Wt 214 lb 12.8 oz (97.4 kg)   BMI 31.72 kg/m  The patient is alert and oriented x 3.  The mood and affect are normal.   Skin: warm and dry.  Color is normal.    HEENT:   the sclera are nonicteric.  The mucous membranes are moist.  The carotids are 2+ without bruits.  There is no thyromegaly.  There is no JVD.    Lungs: clear.  The chest wall is non tender.    Heart: regular rate with a normal S1 and S2.  There are no murmurs, gallops, or rubs. The PMI is not displaced.     Abdomen: good bowel sounds.  There is no guarding or rebound.  There is no hepatosplenomegaly or tenderness.  There are no masses.   Extremities:  no clubbing, cyanosis, or edema.  The legs are without rashes.  The distal pulses are intact.   Neuro:  Cranial nerves II - XII are intact.  Motor and sensory functions are intact.    The gait is normal.  EKG: Oct. 9, 2017 NSR at 66,  Inc. RBBB possible Inf. MI   Assessment / Plan:   .1. Coronary artery disease-   he's doing well. Is not having any episodes of  angina. Will see him in 1 year.   2. Hyperlipidemia-    Labs have been stable. Recheck in 1 year  3. Sleep apnea  4. Hypertension -  His blood pressure is normal.      Kristeen Miss, MD  12/30/2015 9:04 AM    [facility] [address] [place],  [address] [place], [place]  [address] Pager [phone] Phone: [phone]; Fax: [phone]   [facility]  [address] [place], [place]  [address] [phone]   Fax [phone]

## 2015-12-30 NOTE — Patient Instructions (Signed)

## 2015-12-31 DIAGNOSIS — S82852D Displaced trimalleolar fracture of left lower leg, subsequent encounter for closed fracture with routine healing: Secondary | ICD-10-CM | POA: Diagnosis not present

## 2016-01-15 DIAGNOSIS — S82852D Displaced trimalleolar fracture of left lower leg, subsequent encounter for closed fracture with routine healing: Secondary | ICD-10-CM | POA: Diagnosis not present

## 2016-01-29 DIAGNOSIS — Z86010 Personal history of colonic polyps: Secondary | ICD-10-CM | POA: Diagnosis not present

## 2016-01-29 DIAGNOSIS — Z800 Family history of malignant neoplasm of digestive organs: Secondary | ICD-10-CM | POA: Diagnosis not present

## 2016-01-29 DIAGNOSIS — K621 Rectal polyp: Secondary | ICD-10-CM | POA: Diagnosis not present

## 2016-02-09 DIAGNOSIS — Z23 Encounter for immunization: Secondary | ICD-10-CM | POA: Diagnosis not present

## 2016-02-12 DIAGNOSIS — S82852D Displaced trimalleolar fracture of left lower leg, subsequent encounter for closed fracture with routine healing: Secondary | ICD-10-CM | POA: Diagnosis not present

## 2016-02-28 ENCOUNTER — Other Ambulatory Visit: Payer: Self-pay | Admitting: Physician Assistant

## 2016-02-28 DIAGNOSIS — R12 Heartburn: Secondary | ICD-10-CM

## 2016-02-28 MED ORDER — PANTOPRAZOLE SODIUM 40 MG PO TBEC: 40.0000 mg | DELAYED_RELEASE_TABLET | Freq: Every day | ORAL | 0 refills | Status: DC | PRN

## 2016-02-28 NOTE — Telephone Encounter (Signed)
Patient is at [place] visiting he need a RX of  Protonix [facility] to [facility]  [address] [place]. Spoke with Annye RuskBarabra  She refilled 1 month only pt will call us back to make an appt to f/u his medicines when he return back into town

## 2016-02-28 NOTE — Telephone Encounter (Signed)
Pt called and asked for RF to be sent to [facility] where he is visiting. Done. Angie is calling pt back to advise and to tell pt he is due for f/up for more.

## 2016-03-13 ENCOUNTER — Other Ambulatory Visit: Payer: Self-pay | Admitting: Physician Assistant

## 2016-03-13 DIAGNOSIS — R12 Heartburn: Secondary | ICD-10-CM

## 2016-04-14 DIAGNOSIS — L578 Other skin changes due to chronic exposure to nonionizing radiation: Secondary | ICD-10-CM | POA: Diagnosis not present

## 2016-04-14 DIAGNOSIS — D2271 Melanocytic nevi of right lower limb, including hip: Secondary | ICD-10-CM | POA: Diagnosis not present

## 2016-05-13 ENCOUNTER — Ambulatory Visit (INDEPENDENT_AMBULATORY_CARE_PROVIDER_SITE_OTHER): Payer: BLUE CROSS/BLUE SHIELD | Admitting: Family Medicine

## 2016-05-13 VITALS — BP 138/66 | HR 88 | Temp 100.1°F | Resp 18 | Ht 69.0 in | Wt 223.0 lb

## 2016-05-13 DIAGNOSIS — J111 Influenza due to unidentified influenza virus with other respiratory manifestations: Secondary | ICD-10-CM | POA: Diagnosis not present

## 2016-05-13 MED ORDER — OSELTAMIVIR PHOSPHATE 75 MG PO CAPS: 75.0000 mg | ORAL_CAPSULE | Freq: Two times a day (BID) | ORAL | 0 refills | Status: DC

## 2016-05-13 MED ORDER — HYDROCODONE-HOMATROPINE 5-1.5 MG/5ML PO SYRP: 5.0000 mL | ORAL_SOLUTION | Freq: Three times a day (TID) | ORAL | 0 refills | Status: DC | PRN

## 2016-05-13 NOTE — Patient Instructions (Addendum)
  You do have the flu.  It will not last as long since you've had the flu shot, which is good.  Take the Tamiflu twice daily for 5 days.    Take the Hycodan cough syrup for muscle aches and cough.   If you start having any fevers take tylenol to bring that down.    IF you received an x-ray today, you will receive an invoice from [facility]. Please contact [facility] at [phone] with questions or concerns regarding your invoice.   IF you received labwork today, you will receive an invoice from [facility]. Please contact [facility] at [phone] with questions or concerns regarding your invoice.   Our billing staff will not be able to assist you with questions regarding bills from these companies.  You will be contacted with the lab results as soon as they are available. The fastest way to get your results is to activate your My Chart account. Instructions are located on the last page of this paperwork. If you have not heard from us regarding the results in 2 weeks, please contact this office.

## 2016-05-13 NOTE — Progress Notes (Signed)
   SUBJECTIVE: URI symptoms:  Chris [name] is a 61 y.o. male who complains of URI symptoms present for past 1 days.  Describes rhinorrhea, sinus congestion, mild cough.  Feeling of "having been hit by a truck" with myalgias and arthralgias.  Has tried coricidan without relief.  Sick contacts are coworker.  Subjective fevers. No nausea or vomiting.  Denies smoking cigarettes.  Eating and drinking well.  Did get flu shot.    ROS as above.    PMH reviewed. Patient is a nonsmoker.   Medications reviewed.  Physical Exam:  BP 138/66 (BP Location: Right Arm, Patient Position: Sitting, Cuff Size: Small)   Pulse 88   Temp 100.1 F (37.8 C) (Oral)   Resp 18   Ht 5\' 9"  (1.753 m)   Wt 223 lb (101.2 kg)   SpO2 98%   BMI 32.93 kg/m  Gen:  Patient sitting on exam table, appears stated age in no acute distress Head: Normocephalic atraumatic Eyes: EOMI, PERRL, sclera and conjunctiva non-erythematous Ears:  Canals clear bilaterally.  TMs pearly gray bilaterally without erythema or bulging.   Nose:  Nasal turbinates clear exudates BL Mouth: Mucosa membranes moist. Tonsils +2, nonenlarged, non-erythematous. Neck: No cervical lymphadenopathy noted Heart:  RRR, no murmurs auscultated. Pulm:  Clear to auscultation bilaterally with good air movement.  No wheezes or rales noted.    Assessment and Plan:  1.  Influenza: - with fevers/aches/URI symptoms - had MI few years ago.   - Plan to treat with tamiflu - FU if no improvement by next week.

## 2016-05-28 DIAGNOSIS — M79672 Pain in left foot: Secondary | ICD-10-CM | POA: Diagnosis not present

## 2016-06-10 DIAGNOSIS — M79672 Pain in left foot: Secondary | ICD-10-CM | POA: Diagnosis not present

## 2016-06-11 ENCOUNTER — Ambulatory Visit (INDEPENDENT_AMBULATORY_CARE_PROVIDER_SITE_OTHER): Payer: BLUE CROSS/BLUE SHIELD | Admitting: Physician Assistant

## 2016-06-11 VITALS — BP 149/105 | HR 74 | Temp 98.1°F | Resp 18 | Ht 69.0 in | Wt 223.6 lb

## 2016-06-11 DIAGNOSIS — K000 Anodontia: Secondary | ICD-10-CM

## 2016-06-11 DIAGNOSIS — R030 Elevated blood-pressure reading, without diagnosis of hypertension: Secondary | ICD-10-CM | POA: Diagnosis not present

## 2016-06-11 DIAGNOSIS — J309 Allergic rhinitis, unspecified: Secondary | ICD-10-CM | POA: Diagnosis not present

## 2016-06-11 DIAGNOSIS — K08109 Complete loss of teeth, unspecified cause, unspecified class: Secondary | ICD-10-CM | POA: Insufficient documentation

## 2016-06-11 MED ORDER — AZELASTINE HCL 0.15 % NA SOLN: 2.0000 | Freq: Two times a day (BID) | NASAL | 0 refills | Status: DC

## 2016-06-11 NOTE — Patient Instructions (Addendum)
Continue the medications you got at the pharmacy. ADD the nasal spray I have prescribed. Make sure you are getting plenty of water to drink.     IF you received an x-ray today, you will receive an invoice from [facility]. Please contact [facility] at [phone] with questions or concerns regarding your invoice.   IF you received labwork today, you will receive an invoice from [facility]. Please contact [facility] at [phone] with questions or concerns regarding your invoice.   Our billing staff will not be able to assist you with questions regarding bills from these companies.  You will be contacted with the lab results as soon as they are available. The fastest way to get your results is to activate your My Chart account. Instructions are located on the last page of this paperwork. If you have not heard from us regarding the results in 2 weeks, please contact this office.

## 2016-06-11 NOTE — Progress Notes (Signed)
Patient ID: Chris [name], male    DOB: [birth year], 61 y.o.   MRN: [id number]  PCP: No PCP Per Patient  Chief Complaint  Patient presents with  . sinus congestion    x 3 days has taken Mucinex and [facility] brand cough&cold HBP   . nasal drainage    Subjective:   Presents for evaluation of nasal congestion.  Had "the flu" several weeks ago.  He was prescribed Augmentin 05/28/2016 for paronychia caused by ingrown toenail. He has one dose left.  3 days ago he developed terrible nasal congestion. Unable to breathe through the nose. Holding an ice cube to his nose helped, as he noticed that he could breathe better when outside in the cold air. Nonetheless, he has slept poorly as a result. Mild coughing, usually associated with post nasal drainage.  He started OTC Mucinex and a chlorpheniramine-dextromethorphan product. His pharmacist told him to stop the Mucinex "because it's for the chest."  No fever, chills. No nausea, vomiting or diarrhea except for one episode of vomiting when he took a dose of Augmentin on an empty stomach. No muscle or joint pain.  He has known CAD, and BP is elevated today. He doesn't have a PCP, but uses this office, and sees a cardiologist (last visit 12/30/2015).  No CP, SOB, HA, dizziness.   Review of Systems As above. No GU/GI symptoms. No muscle or joint pain.    Patient Active Problem List   Diagnosis Date Noted  . Closed fracture of left lateral malleolus 11/25/2015  . Overweight 05/21/2014  . History of MI (myocardial infarction) 05/21/2014  . Unstable angina (HCC) 10/10/2012  . ED (erectile dysfunction) 07/25/2012  . RESTLESS LEGS SYNDROME 02/20/2010  . Hyperlipidemia 02/19/2010  . OBSTRUCTIVE SLEEP APNEA 02/19/2010  . CAD (coronary artery disease) 02/19/2010     Prior to Admission medications   Medication Sig Start Date End Date Taking? Authorizing Provider  aspirin 81 MG tablet Take 81 mg by mouth daily.    Yes Historical Provider,  MD  atorvastatin (LIPITOR) 40 MG tablet Take 1 tablet (40 mg total) by mouth daily. 12/11/15  Yes Vesta MixerPhilip J Nahser, MD  clopidogrel (PLAVIX) 75 MG tablet Take 1 tablet (75 mg total) by mouth daily. 11/19/15  Yes Vesta MixerPhilip J Nahser, MD  metoprolol succinate (TOPROL-XL) 25 MG 24 hr tablet TAKE 1/2 TABLET BY MOUTH 2 TIMES DAILY 11/11/15  Yes Vesta MixerPhilip J Nahser, MD  Multiple Vitamin (MULTIVITAMIN) tablet Take 1 tablet by mouth daily.   Yes Historical Provider, MD  Omega-3 Fatty Acids (FISH OIL) 1200 MG CAPS Take 2 capsules by mouth 2 (two) times daily at 10 AM and 5 PM.    Yes Historical Provider, MD     Allergies  Allergen Reactions  . Ace Inhibitors Rash    REACTION: Rash  . Ropinirole Hcl Rash       Objective:  Physical Exam  Constitutional: He is oriented to person, place, and time. He appears well-developed and well-nourished. No distress.  BP (!) 149/105 (BP Location: Right Arm, Patient Position: Sitting, Cuff Size: Large)   Pulse 74   Temp 98.1 F (36.7 C) (Oral)   Resp 18   Ht 5\' 9"  (1.753 m)   Wt 223 lb 9.6 oz (101.4 kg)   SpO2 98%   BMI 33.02 kg/m    HENT:  Head: Normocephalic and atraumatic.  Right Ear: Hearing, tympanic membrane, external ear and ear canal normal.  Left Ear: Hearing, tympanic membrane, external  ear and ear canal normal.  Nose: Mucosal edema (pale blue hue) and rhinorrhea (clear) present.  No foreign bodies. Right sinus exhibits no maxillary sinus tenderness and no frontal sinus tenderness. Left sinus exhibits no maxillary sinus tenderness and no frontal sinus tenderness.  Mouth/Throat: Uvula is midline, oropharynx is clear and moist and mucous membranes are normal. No uvula swelling. No oropharyngeal exudate.  Eyes: Conjunctivae and EOM are normal. Pupils are equal, round, and reactive to light. Right eye exhibits no discharge. Left eye exhibits no discharge. No scleral icterus.  Neck: Trachea normal, normal range of motion and full passive range of motion without  pain. Neck supple. No thyroid mass and no thyromegaly present.  Cardiovascular: Normal rate, regular rhythm and normal heart sounds.   Pulmonary/Chest: Effort normal and breath sounds normal.  Lymphadenopathy:       Head (right side): No submandibular, no tonsillar, no preauricular, no posterior auricular and no occipital adenopathy present.       Head (left side): No submandibular, no tonsillar, no preauricular and no occipital adenopathy present.    He has no cervical adenopathy.       Right: No supraclavicular adenopathy present.       Left: No supraclavicular adenopathy present.  Neurological: He is alert and oriented to person, place, and time. He has normal strength. No cranial nerve deficit or sensory deficit.  Skin: Skin is warm, dry and intact. No rash noted.  Psychiatric: He has a normal mood and affect. His speech is normal and behavior is normal.           Assessment & Plan:   1. Acute allergic rhinitis, unspecified seasonality, unspecified trigger Resume Mucinex. Continue OTC antihistamine-DM product. Add nasal spray. Hydrate. Rest. Anticipatory guidance. - Azelastine HCl 0.15 % SOLN; Place 2 sprays into both nostrils 2 (two) times daily.  Dispense: 30 mL; Refill: 0  2. Elevated blood pressure reading Return in 2 weeks, presumably he will be well, and reassess his BP. Consider increasing the beta blocker or adding a second agent. - Care order/instruction:   Fernande Bras, PA-C Physician Assistant-Certified Primary Care at [facility]

## 2016-06-22 ENCOUNTER — Emergency Department (HOSPITAL_COMMUNITY): Payer: BLUE CROSS/BLUE SHIELD

## 2016-06-22 ENCOUNTER — Ambulatory Visit (INDEPENDENT_AMBULATORY_CARE_PROVIDER_SITE_OTHER): Payer: BLUE CROSS/BLUE SHIELD | Admitting: Physician Assistant

## 2016-06-22 ENCOUNTER — Telehealth: Payer: Self-pay | Admitting: Cardiovascular Disease

## 2016-06-22 ENCOUNTER — Encounter (HOSPITAL_COMMUNITY): Payer: Self-pay

## 2016-06-22 ENCOUNTER — Other Ambulatory Visit: Payer: Self-pay

## 2016-06-22 ENCOUNTER — Emergency Department (HOSPITAL_COMMUNITY)
Admission: EM | Admit: 2016-06-22 | Discharge: 2016-06-22 | Disposition: A | Discharge status: Home / Self Care | Payer: BLUE CROSS/BLUE SHIELD | Attending: Emergency Medicine | Admitting: Emergency Medicine

## 2016-06-22 VITALS — BP 147/92 | HR 62 | Temp 97.7°F | Resp 16 | Ht 68.5 in | Wt 219.0 lb

## 2016-06-22 DIAGNOSIS — I10 Essential (primary) hypertension: Secondary | ICD-10-CM

## 2016-06-22 DIAGNOSIS — I2510 Atherosclerotic heart disease of native coronary artery without angina pectoris: Secondary | ICD-10-CM | POA: Insufficient documentation

## 2016-06-22 DIAGNOSIS — R072 Precordial pain: Secondary | ICD-10-CM | POA: Diagnosis not present

## 2016-06-22 DIAGNOSIS — R079 Chest pain, unspecified: Secondary | ICD-10-CM

## 2016-06-22 DIAGNOSIS — I252 Old myocardial infarction: Secondary | ICD-10-CM | POA: Insufficient documentation

## 2016-06-22 DIAGNOSIS — E785 Hyperlipidemia, unspecified: Secondary | ICD-10-CM | POA: Diagnosis not present

## 2016-06-22 DIAGNOSIS — Z87891 Personal history of nicotine dependence: Secondary | ICD-10-CM | POA: Diagnosis not present

## 2016-06-22 DIAGNOSIS — Z955 Presence of coronary angioplasty implant and graft: Secondary | ICD-10-CM | POA: Insufficient documentation

## 2016-06-22 DIAGNOSIS — R0789 Other chest pain: Secondary | ICD-10-CM | POA: Diagnosis not present

## 2016-06-22 DIAGNOSIS — Z7982 Long term (current) use of aspirin: Secondary | ICD-10-CM | POA: Diagnosis not present

## 2016-06-22 LAB — CBC
HEMATOCRIT: 42.9 % (ref 39.0–52.0)
Hemoglobin: 14.4 g/dL (ref 13.0–17.0)
MCH: 29.4 pg (ref 26.0–34.0)
MCHC: 33.6 g/dL (ref 30.0–36.0)
MCV: 87.7 fL (ref 78.0–100.0)
Platelets: 343 10*3/uL (ref 150–400)
RBC: 4.89 MIL/uL (ref 4.22–5.81)
RDW: 12.8 % (ref 11.5–15.5)
WBC: 7.3 10*3/uL (ref 4.0–10.5)

## 2016-06-22 LAB — BASIC METABOLIC PANEL
Anion gap: 9 (ref 5–15)
BUN: 7 mg/dL (ref 6–20)
CHLORIDE: 105 mmol/L (ref 101–111)
CO2: 24 mmol/L (ref 22–32)
CREATININE: 0.87 mg/dL (ref 0.61–1.24)
Calcium: 9.1 mg/dL (ref 8.9–10.3)
GFR calc Af Amer: >60 mL/min (ref >60)
GFR calc non Af Amer: >60 mL/min (ref >60)
GLUCOSE: 116 mg/dL — AB (ref 65–99)
POTASSIUM: 3.8 mmol/L (ref 3.5–5.1)
Sodium: 138 mmol/L (ref 135–145)

## 2016-06-22 LAB — TROPONIN I

## 2016-06-22 LAB — I-STAT TROPONIN, ED: Troponin i, poc: 0 ng/mL (ref 0.00–0.08)

## 2016-06-22 MED ORDER — AMLODIPINE BESYLATE 2.5 MG PO TABS: 2.5000 mg | ORAL_TABLET | Freq: Every day | ORAL | 1 refills | Status: DC

## 2016-06-22 MED ORDER — ASPIRIN 81 MG PO CHEW
324.0000 mg | CHEWABLE_TABLET | Freq: Once | ORAL | Status: AC
Start: 2016-06-22 — End: 2016-06-22
  Administered 2016-06-22: 243 mg via ORAL
  Filled 2016-06-22: qty 4

## 2016-06-22 NOTE — Patient Instructions (Signed)
     IF you received an x-ray today, you will receive an invoice from [facility]. Please contact [facility] at [phone] with questions or concerns regarding your invoice.   IF you received labwork today, you will receive an invoice from [facility]. Please contact [facility] at [phone] with questions or concerns regarding your invoice.   Our billing staff will not be able to assist you with questions regarding bills from these companies.  You will be contacted with the lab results as soon as they are available. The fastest way to get your results is to activate your My Chart account. Instructions are located on the last page of this paperwork. If you have not heard from us regarding the results in 2 weeks, please contact this office.     

## 2016-06-22 NOTE — Consult Note (Signed)
Cardiology copnsult    Patient ID: Chris [name] MRN: [id number], DOB/AGE: December 30, 1955   Admit date: 06/22/2016  Primary Physician: Porfirio Oar, PA-C Primary Cardiologist: Dr. [name]  History of Present Illness     Chris [name] is a 61 y.o. male who is being seen today for the evaluation of chest pain at the request of Dr Ranae Palms. He has a past medical history of inferior wall MI and Promus stent to RCA 2009, HLD, sleep apnea and HTN.  He was doing well at his last office appointment with Dr. [name] in 12/2015 and he is being seen yearly and he continued to do well until this morning at work. On Friday he had been working at his new job at an [employer] and he had a very physical day lifting tires and such. He had no chest pain at that time and none through the weekend. This morning after pushing a broom and on the way to the bathroom he developed tightness across his chest. This was non-radiating and he had no associated shortness of breath, lightheadedness, palpitations, or diaphoresis. He called the cardiology office and was told to go the ED. He instead chose to go to urgent care who sent him to the ED. He drove himself. The discomfort lasted for about 2 hours resolving spontaneously without intervention when he was at urgent care. He has had no reoccurrence.   He has had no recent exertional symptoms. No recent orthopnea, edema or PND. He quit smoking in 2009 after his MI and was exercising regularly until the fall when he fractured his ankle. His cholesterol is well controlled.   Troponin 0.00 CXR: No active cardiopulmonary disease EKG: NSR 64 bpm, LAD, incomplete RBBB, old inferior MI, ?new Q wave in V3  Low risk myocardial perfusion study 2014 No recent echo.  Past Medical History    Past Medical History:  Diagnosis Date  . Acute MI, inferior wall, initial episode of care [facility]) 2009  . Allergy   . Coronary atherosclerosis of unspecified type of vessel,  native or graft   . Other and unspecified hyperlipidemia   . Sleep apnea     Past Surgical History:  Procedure Laterality Date  . CHOLECYSTECTOMY    . CORONARY STENT PLACEMENT  2009    2.75 x 28 mm Promus stent to the RCA  . ORIF ANKLE FRACTURE Left 11/27/2015   Procedure: OPEN REDUCTION INTERNAL FIXATION (ORIF) ANKLE FRACTURE;  Surgeon: [name], MD;  Location: [facility];  Service: Orthopedics;  Laterality: Left;     Allergies  Allergies  Allergen Reactions  . Ace Inhibitors Rash    REACTION: Rash  . Ropinirole Hcl Rash    Home Medications    Prior to Admission medications   Medication Sig Start Date End Date Taking? Authorizing Provider  aspirin 81 MG tablet Take 81 mg by mouth daily.     Historical Provider, MD  atorvastatin (LIPITOR) 40 MG tablet Take 1 tablet (40 mg total) by mouth daily. 12/11/15   Vesta Mixer, MD  Azelastine HCl 0.15 % SOLN Place 2 sprays into both nostrils 2 (two) times daily. Patient not taking: Reported on 06/22/2016 06/11/16   Porfirio Oar, PA-C  clopidogrel (PLAVIX) 75 MG tablet Take 1 tablet (75 mg total) by mouth daily. 11/19/15   Vesta Mixer, MD  metoprolol succinate (TOPROL-XL) 25 MG 24 hr tablet TAKE 1/2 TABLET BY MOUTH 2 TIMES DAILY 11/11/15   Deloris Ping Nahser,  MD  Multiple Vitamin (MULTIVITAMIN) tablet Take 1 tablet by mouth daily.    Historical Provider, MD  Omega-3 Fatty Acids (FISH OIL) 1200 MG CAPS Take 2 capsules by mouth 2 (two) times daily at 10 AM and 5 PM.     Historical Provider, MD    Family History    Family History  Problem Relation Age of Onset  . Heart disease Mother   . Hypertension Father   . Prostate cancer Brother   . CAD Brother     Social History    Social History   Social History  . Marital status: Married    Spouse name: N/A  . Number of children: N/A  . 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 Smoking  status: Former Smoker    Quit date: 08/22/2007  . Smokeless tobacco: Never Used  . Alcohol use 0.0 oz/week     Comment: very little  . Drug use: No  . Sexual activity: Yes   Other Topics Concern  . Not on file   Social History Narrative   Lives with wife. Exercises occasionally.     Review of Systems   General:  No chills, fever, night sweats or weight changes.  Cardiovascular: Chest tightness as above No dyspnea on exertion, edema, orthopnea, palpitations, paroxysmal nocturnal dyspnea. Dermatological: No rash, lesions/masses Respiratory: No cough, dyspnea Urologic: No hematuria, dysuria Abdominal:   No nausea, vomiting, diarrhea, bright red blood per rectum, melena, or hematemesis Neurologic:  No visual changes, wkns, changes in mental status. All other systems reviewed and are otherwise negative except as noted above.  Physical Exam    Blood pressure (!) 153/94, pulse (!) 58, temperature 98.5 F (36.9 C), temperature source Oral, resp. rate 11, height 5' 8.5" (1.74 m), weight 219 lb (99.3 kg), SpO2 100 %.  General: Well developed, well nourished,male in no acute distress. Head: Normocephalic, atraumatic, sclera non-icteric, no xanthomas, nares are without discharge.  Neck: No carotid bruits. JVD not elevated.  Lungs: Respirations regular and unlabored, without wheezes or rales.  Heart: Regular rate and rhythm. No S3 or S4.  No murmur, no rubs, or gallops appreciated. Abdomen: Soft, non-tender, non-distended with normoactive bowel sounds.  Msk:  Strength and tone appear normal for age. No joint deformities or effusions. Extremities: No clubbing or cyanosis. No edema.  Distal pedal pulses are 2+ bilaterally. Neuro: Alert and oriented X 3. Moves all extremities spontaneously. No focal deficits noted. Psych:  Responds to questions appropriately with a normal affect. Skin: No rashes or lesions noted  Labs    Troponin (Point of Care Test)  Recent Labs  06/22/16 1140    TROPIPOC 0.00   No results for input(s): CKTOTAL, CKMB, TROPONINI in the last 72 hours. Lab Results  Component Value Date   WBC 7.3 06/22/2016   HGB 14.4 06/22/2016   HCT 42.9 06/22/2016   MCV 87.7 06/22/2016   PLT 343 06/22/2016     Recent Labs Lab 06/22/16 1048  NA 138  K 3.8  CL 105  CO2 24  BUN 7  CREATININE 0.87  CALCIUM 9.1  GLUCOSE 116*   Lab Results  Component Value Date   CHOL 105 (L) 11/11/2015   HDL 39 (L) 11/11/2015   LDLCALC 45 11/11/2015   TRIG 106 11/11/2015   No results found for: DDIMER  No results found for: BNP Pro B Natriuretic peptide (BNP)  Date/Time  Value Ref Range Status  08/31/2007 05:11 AM 41.0  Final   No results for input(s): INR in the last 72 hours.    Radiology Studies    Dg Chest 2 View  Result Date: 06/22/2016 CLINICAL DATA:  Chest tightness today EXAM: CHEST  2 VIEW COMPARISON:  Chest x-ray of 10/10/2012 FINDINGS: No active infiltrate or effusion is seen. Mediastinal and hilar contours are unremarkable. The heart is within upper limits of normal in size. No bony abnormality is seen. IMPRESSION: No active cardiopulmonary disease. Electronically Signed   By: Dwyane Dee M.D.   On: 06/22/2016 11:19    EKG & Cardiac Imaging    EKG: NSR 64 bpm, LAD, incomplete RBBB, old inferior MI, ?new Q wave in V3  ECHOCARDIOGRAM: No recent  Assessment & Plan    Chest pain -Hx MI with stent to RCA 2009. Has been doing well since. CAD treated with BB, aspirin, Plavix and statin -Pt had a 1-2 hour episode of tightness across that chest, non-radiating and with no associated symptoms. Occurred after sweeping and resolved spontaneously. -First troponin negative. EKG without ischemic changes. CXR without abnormality -Advise to check one more troponin and if negative he can follow up as an out patient. -Continue beta blocker, plavix, aspirin, and statin -Provide SL NTG for home use.   Hypertension -BP mildly elevated today and has been up in  the office.  -treated with metoprolol 12.5 mg daily. -Advise to start amlodipine 2.5 mg for better BP control and can titrate in the office.  Hyperlipidemia -Last lipid panel on 11/11/2015: LDL 45 at goal, HDL 39, Trig 106 -Conitnue atorvastatin 40 mg and Fishoil  Signed, Berton Bon, NP-C 06/22/2016, 2:30 PM Pager: [phone]  Pt seen and examined  I agree with findings as noted by Cleotis Nipper above  Pt 61 yo with known CAD  He has been doing good  Actve   This am finished sweeping at work  [employer] to [facility] then says his chest felt tight.  Went to MD who sent to ED Pain is gone Current discomfort is different than what he felt with prior MI  THis was chest tightness tht was cold and indigestion. Pt feels OK now  Has been up to BR twice  ON exam:  Lungs are CTA  No wheezes    Cardiac RRR  No S3  Ext without edema    Would get second trop  If neg ambulate  If does OK would d/c with close outpt f/u  As noted above would add low dose amlodipine for BP controll  Follow as outpt.    Dietrich Pates

## 2016-06-22 NOTE — Telephone Encounter (Signed)
New message     Pt is having chest tightness and his bp is 147/92  Has appt 06/23/16 11:30am w Tyrone Sage

## 2016-06-22 NOTE — Telephone Encounter (Signed)
Pt states about an hour ago while changing a tire, he developed tightness across his chest, 7 out 10, denies any other associated symptoms except neck pain for more than 2 weeks than goes in to his chest , his neck pain worse when he turns his neck. I reviewed with Dr Delton See, she recommended pt try NTG for symptom relief, evaluation in ED if symptoms not relieved with NTG. Pt states he does not have NTG with him, I advised pt to have someone take him to [facility] ED for evaluation now. Pt states he will have someone drive him to [facility] ED for further evaluation.

## 2016-06-22 NOTE — Discharge Instructions (Signed)
Take the new medication as prescribed. Make appointment to follow-up with cardiology. Return for any new or worse symptoms.

## 2016-06-22 NOTE — Progress Notes (Deleted)
CARDIOLOGY OFFICE NOTE  Date:  06/22/2016    Chris [name] Date of Birth: [birth year] Medical Record #[id number]  PCP:  Porfirio Oar, PA-C  Cardiologist:  Nahser    No chief complaint on file.   History of Present Illness: Chris [name] is a 61 y.o. male who presents today for a post ER visit. Seen for Dr. [name].   He has a history of coronary artery disease. He presented in 2009 with chest pain and was found to have an inferior wall myocardial infarction. He underwent successful PTCA and stenting of his mid right coronary artery using a 2.75 x 28 mm Promus stent. The stent was post dilated using a 3.0 noncompliant balloon.  We then positioned a 3.0 x 15 mm Promus stent in the proximal right coronary artery.  The stent was post dilated using a 3.25 mm noncompliant balloon.  He has had a prior history of tobacco abuse.   Last seen by Dr. [name] back in October and was felt to be doing well.  Yesterday he presented to the ER. This past Friday he had been working at his new job at an [employer] and he had a very physical day lifting tires and such. He had no chest pain at that time and none through the weekend. Yesterday morning however, after pushing a broom and on the way to the bathroom he developed tightness across his chest. This was non-radiating and he had no associated shortness of breath, lightheadedness, palpitations, or diaphoresis. He called the cardiology office and was told to go the ED. He instead chose to go to urgent care who sent him to the ED. He drove himself. The discomfort lasted for about 2 hours resolving spontaneously without intervention when he was at urgent care. He has had no reoccurrence.   Negative evaluation in the ER - was seen by Dr. Tenny Craw yesterday as well. Referred for outpatient evaluation.   Comes in today. Here with   Past Medical History:  Diagnosis Date  . Acute MI, inferior wall, initial episode of care [facility]) 2009  . Allergy     . Coronary atherosclerosis of unspecified type of vessel, native or graft   . Other and unspecified hyperlipidemia   . Sleep apnea     Past Surgical History:  Procedure Laterality Date  . CHOLECYSTECTOMY    . CORONARY STENT PLACEMENT  2009    2.75 x 28 mm Promus stent to the RCA  . ORIF ANKLE FRACTURE Left 11/27/2015   Procedure: OPEN REDUCTION INTERNAL FIXATION (ORIF) ANKLE FRACTURE;  Surgeon: [name], MD;  Location: [facility];  Service: Orthopedics;  Laterality: Left;     Medications: Current Outpatient Prescriptions  Medication Sig Dispense Refill  . aspirin 81 MG tablet Take 81 mg by mouth daily.     [name] atorvastatin (LIPITOR) 40 MG tablet Take 1 tablet (40 mg total) by mouth daily. 30 tablet 11  . Azelastine HCl 0.15 % SOLN Place 2 sprays into both nostrils 2 (two) times daily. (Patient not taking: Reported on 06/22/2016) 30 mL 0  . Chlorphen-Phenyleph-APAP (CORICIDIN D COLD/FLU/SINUS) 2-5-325 MG TABS Take 1 tablet by mouth daily as needed (sinus pain).    [name] clopidogrel (PLAVIX) 75 MG tablet Take 1 tablet (75 mg total) by mouth daily. 90 tablet 3  . guaiFENesin (MUCINEX) 600 MG 12 hr tablet Take 600 mg by mouth 2 (two) times daily as needed for cough.    . metoprolol succinate (  TOPROL-XL) 25 MG 24 hr tablet TAKE 1/2 TABLET BY MOUTH 2 TIMES DAILY 30 tablet 11  . Multiple Vitamin (MULTIVITAMIN) tablet Take 1 tablet by mouth daily.    . Omega-3 Fatty Acids (FISH OIL) 1200 MG CAPS Take 2 capsules by mouth 2 (two) times daily at 10 AM and 5 PM.      No current facility-administered medications for this visit.     Allergies: Allergies  Allergen Reactions  . Ace Inhibitors Rash    REACTION: Rash  . Ropinirole Hcl Rash    Social History: The patient  reports that he quit smoking about 8 years ago. He has never used smokeless tobacco. He reports that he drinks alcohol. He reports that he does not use drugs.   Family History: The patient's family history includes  CAD in his brother; Heart disease in his mother; Hypertension in his father; Prostate cancer in his brother.   Review of Systems: Please see the history of present illness.   Otherwise, the review of systems is positive for none.   All other systems are reviewed and negative.   Physical Exam: VS:  There were no vitals taken for this visit. [name]  BMI There is no height or weight on file to calculate BMI.  Wt Readings from Last 3 Encounters:  06/22/16 219 lb (99.3 kg)  06/22/16 219 lb (99.3 kg)  06/11/16 223 lb 9.6 oz (101.4 kg)    General: Pleasant. Well developed, well nourished and in no acute distress.   HEENT: Normal.  Neck: Supple, no JVD, carotid bruits, or masses noted.  Cardiac: Regular rate and rhythm. No murmurs, rubs, or gallops. No edema.  Respiratory:  Lungs are clear to auscultation bilaterally with normal work of breathing.  GI: Soft and nontender.  MS: No deformity or atrophy. Gait and ROM intact.  Skin: Warm and dry. Color is normal.  Neuro:  Strength and sensation are intact and no gross focal deficits noted.  Psych: Alert, appropriate and with normal affect.   LABORATORY DATA:  EKG:  EKG is not ordered today.  Lab Results  Component Value Date   WBC 7.3 06/22/2016   HGB 14.4 06/22/2016   HCT 42.9 06/22/2016   PLT 343 06/22/2016   GLUCOSE 116 (H) 06/22/2016   CHOL 105 (L) 11/11/2015   TRIG 106 11/11/2015   HDL 39 (L) 11/11/2015   LDLCALC 45 11/11/2015   ALT 27 11/11/2015   AST 22 11/11/2015   NA 138 06/22/2016   K 3.8 06/22/2016   CL 105 06/22/2016   CREATININE 0.87 06/22/2016   BUN 7 06/22/2016   CO2 24 06/22/2016   TSH 1.796 03/13/2015   PSA 0.83 05/21/2014   INR 0.89 03/25/2010   HGBA1C 6.1 (H) 11/19/2014    BNP (last 3 results) No results for input(s): BNP in the last 8760 hours.  ProBNP (last 3 results) No results for input(s): PROBNP in the last 8760 hours.   Other Studies Reviewed Today:  Myoview Impression from 09/2012 Exercise  Capacity:  Good exercise capacity. BP Response:  Normal blood pressure response. Clinical Symptoms:  No chest pain or dyspnea. ECG Impression:  No significant ST segment change suggestive of ischemia. Comparison with Prior Nuclear Study: No significant change from previous study 01/23/10.  Overall Impression:  Low risk stress nuclear study with a moderate size, severe intensity, fixed inferior defect consistent with prior inferior infarct and no significant ischemia.  LV Ejection Fraction: 63%.  LV Wall Motion:  NL LV Function;  NL Wall Motion   Olga Millers  Assessment/Plan:  Chest pain -Hx MI with stent to RCA 2009. Has been doing well since. CAD treated with BB, aspirin, Plavix and statin -Pt had a 1-2 hour episode of tightness across that chest, non-radiating and with no associated symptoms. Occurred after sweeping and resolved spontaneously. -First troponin negative. EKG without ischemic changes. CXR without abnormality -Advise to check one more troponin and if negative he can follow up as an out patient. -Continue beta blocker, plavix, aspirin, and statin -Provide SL NTG for home use.   Hypertension -BP mildly elevated today and has been up in the office.  -treated with metoprolol 12.5 mg daily. -Advise to start amlodipine 2.5 mg for better BP control and can titrate in the office.  Hyperlipidemia -Last lipid panel on 11/11/2015: LDL 45 at goal, HDL 39, Trig 106 -Conitnue atorvastatin 40 mg and Fishoil  Current medicines are reviewed with the patient today.  The patient does not have concerns regarding medicines other than what has been noted above.  The following changes have been made:  See above.  Labs/ tests ordered today include:   No orders of the defined types were placed in this encounter.    Disposition:   FU with Dr. [name] as planned unless stress test is abnormal.  in {gen number [id number]:[id number]} {Days to years:10300}.   Patient is agreeable to this  plan and will call if any problems develop in the interim.   SignedNorma Fredrickson, NP  06/22/2016 4:12 PM  [facility] [address] [place], [place]  [address] Phone: [phone] Fax: [phone]

## 2016-06-22 NOTE — ED Provider Notes (Signed)
Patient seen by cardiology. Note states that if second troponin was negative patient can be discharged home on low-dose amlodipine. Patient able to ambulate without any difficulty. Patient stable for discharge home and follow-up with cardiology.   Vanetta Mulders, MD 06/22/16 1731

## 2016-06-22 NOTE — Progress Notes (Signed)
06/22/2016 10:18 AM   DOB: 1955/08/18 / MRN: [id number]  SUBJECTIVE:  Chris [name] is a 61 y.o. male presenting for a blood pressure check.  Says this started when he got sick a few weeks back.   Tells me that he is having 3-4/10 chest tightness today. Denies cough. This started this morning. He called his cardiologist office today and they advised that he go to the ED.  He did have an MI in 2007. Denies diaphoresis. Denies radicular pattern, nausea.  He denies SOB at this time. He does walk on the TM thirty minutes daily and tells me this is normal, but he has not been on the machine for a week now.   Last cards note as follows:   "Problems: 1. Coronary artery disease- He presented in 2009 with some episodes of chest pain and was found to have an inferior wall myocardial infarction. He underwent successful PTCA and stenting of his mid  right coronary artery using a 2.75 x 28 mm Promus stent. The stent was post dilated using a 3.0 noncompliant balloon.  We then positioned a 3.0 x 15 mm Promus stent in the proximal right coronary artery.  The stent was post dilated using a 3.25 mm noncompliant balloon."   Dr. Lourena Simmonds felt his was doing well at that time and advised that return in a year for follow up.   He is allergic to ace inhibitors and ropinirole hcl.   He  has a past medical history of Acute MI, inferior wall, initial episode of care [facility]) (2009); Allergy; Coronary atherosclerosis of unspecified type of vessel, native or graft; Other and unspecified hyperlipidemia; and Sleep apnea.    He  reports that he quit smoking about 8 years ago. He has never used smokeless tobacco. He reports that he drinks alcohol. He reports that he does not use drugs. He  reports that he currently engages in sexual activity. The patient  has a past surgical history that includes Cholecystectomy; Coronary stent placement (2009); and ORIF ankle fracture (Left, 11/27/2015).  His family history includes Heart disease in  his mother; Prostate cancer in his brother.  Review of Systems  Constitutional: Negative for chills and fever.  Respiratory: Negative for cough and shortness of breath.   Cardiovascular: Positive for chest pain. Negative for leg swelling.  Skin: Negative for itching and rash.  Neurological: Negative for dizziness.    The problem list and medications were reviewed and updated by myself where necessary and exist elsewhere in the encounter.   OBJECTIVE:  BP (!) 147/92   Pulse 62   Temp 97.7 F (36.5 C)   Resp 16   Ht 5' 8.5" (1.74 m)   Wt 219 lb (99.3 kg)   SpO2 98%   BMI 32.81 kg/m   BP Readings from Last 3 Encounters:  06/22/16 (!) 147/92  06/11/16 (!) 149/105  05/13/16 138/66   Physical Exam  Constitutional: He is oriented to person, place, and time.  Cardiovascular: Normal rate, regular rhythm, S1 normal, S2 normal, normal heart sounds, intact distal pulses and normal pulses.  Exam reveals no gallop and no friction rub.   No murmur heard. Pulmonary/Chest: Effort normal and breath sounds normal. No respiratory distress. He has no wheezes. He has no rales. He exhibits no tenderness.    Abdominal: He exhibits no distension.  Musculoskeletal: Normal range of motion. He exhibits no edema.  Neurological: He is alert and oriented to person, place, and time.   Wt Readings from Last  3 Encounters:  06/22/16 219 lb (99.3 kg)  06/11/16 223 lb 9.6 oz (101.4 kg)  05/13/16 223 lb (101.2 kg)   ASSESSMENT AND PLAN:  Diagnoses and all orders for this visit:  Chest tightness or pressure Comments: Given his history and negative exam advised that there is no way I can rule out an MI here.  Advised that we can run an EKG and try to find another cause, however this is doubtful and time is of the essence.  He wants to go directly to the ED and tells me "I ain't going by no ambulance." He will go directly there.   The patient is advised to call or return to clinic if he does not see an  improvement in symptoms, or to seek the care of the closest emergency department if he worsens with the above plan.   [name], MHS, PA-C [facility] 06/22/2016 10:18 AM

## 2016-06-22 NOTE — ED Triage Notes (Signed)
Pt states he was at work and began to have chest tightness, hx of MI. 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

## 2016-06-22 NOTE — ED Provider Notes (Signed)
[facility] Provider Note   CSN: [id number] Arrival date & time: 06/22/16  1034     History   Chief Complaint Chief Complaint  Patient presents with  . Chest Pain    HPI Chris [name] is a 61 y.o. male.  HPI Patient presents with chest tightness that started around 9:30 this morning while at work. 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 No radiation of the pain. No associated nausea or shortness of breath. Pain resolved with rest. Patient took his normal medications this morning. Currently is asymptomatic. Prior history of MI in 2009. He is followed by Dr. Melburn Popper.  Past Medical History:  Diagnosis Date  . Acute MI, inferior wall, initial episode of care [facility]) 2009  . Allergy   . Coronary atherosclerosis of unspecified type of vessel, native or graft   . Other and unspecified hyperlipidemia   . Sleep apnea     Patient Active Problem List   Diagnosis Date Noted  . Edentulous 06/11/2016  . Closed fracture of left lateral malleolus 11/25/2015  . Overweight 05/21/2014  . History of MI (myocardial infarction) 05/21/2014  . Unstable angina (HCC) 10/10/2012  . ED (erectile dysfunction) 07/25/2012  . RESTLESS LEGS SYNDROME 02/20/2010  . Hyperlipidemia 02/19/2010  . OBSTRUCTIVE SLEEP APNEA 02/19/2010  . CAD (coronary artery disease) 02/19/2010    Past Surgical History:  Procedure Laterality Date  . CHOLECYSTECTOMY    . CORONARY STENT PLACEMENT  2009    2.75 x 28 mm Promus stent to the RCA  . ORIF ANKLE FRACTURE Left 11/27/2015   Procedure: OPEN REDUCTION INTERNAL FIXATION (ORIF) ANKLE FRACTURE;  Surgeon: [name], MD;  Location: [facility];  Service: Orthopedics;  Laterality: Left;       Home Medications    Prior to Admission medications   Medication Sig Start Date End Date Taking? Authorizing Provider  aspirin 81 MG tablet Take 81 mg by mouth daily.    Yes Historical Provider, MD  atorvastatin  (LIPITOR) 40 MG tablet Take 1 tablet (40 mg total) by mouth daily. 12/11/15  Yes Vesta Mixer, MD  Chlorphen-Phenyleph-APAP (CORICIDIN D COLD/FLU/SINUS) 2-5-325 MG TABS Take 1 tablet by mouth daily as needed (sinus pain).   Yes Historical Provider, MD  clopidogrel (PLAVIX) 75 MG tablet Take 1 tablet (75 mg total) by mouth daily. 11/19/15  Yes Vesta Mixer, MD  guaiFENesin (MUCINEX) 600 MG 12 hr tablet Take 600 mg by mouth 2 (two) times daily as needed for cough.   Yes Historical Provider, MD  metoprolol succinate (TOPROL-XL) 25 MG 24 hr tablet TAKE 1/2 TABLET BY MOUTH 2 TIMES DAILY 11/11/15  Yes Vesta Mixer, MD  Multiple Vitamin (MULTIVITAMIN) tablet Take 1 tablet by mouth daily.   Yes Historical Provider, MD  Omega-3 Fatty Acids (FISH OIL) 1200 MG CAPS Take 2 capsules by mouth 2 (two) times daily at 10 AM and 5 PM.    Yes Historical Provider, MD  amLODipine (NORVASC) 2.5 MG tablet Take 1 tablet (2.5 mg total) by mouth daily. 06/22/16   Vanetta Mulders, MD  Azelastine HCl 0.15 % SOLN Place 2 sprays into both nostrils 2 (two) times daily. Patient not taking: Reported on 06/22/2016 06/11/16   Porfirio Oar, PA-C    Family History Family History  Problem Relation Age of Onset  . Heart disease Mother   . Hypertension Father   . Prostate cancer Brother   . CAD Brother  Social History Social History  Substance Use Topics  . Smoking status: Former Smoker    Quit date: 08/22/2007  . Smokeless tobacco: Never Used  . Alcohol use 0.0 oz/week     Comment: very little     Allergies   Ace inhibitors and Ropinirole hcl   Review of Systems Review of Systems  Constitutional: Negative for chills, fatigue and fever.  Respiratory: Positive for chest tightness. Negative for cough and shortness of breath.   Cardiovascular: Positive for chest pain. Negative for palpitations and leg swelling.  Gastrointestinal: Negative for abdominal pain, constipation, diarrhea, nausea and vomiting.    Musculoskeletal: Negative for back pain, myalgias, neck pain and neck stiffness.  Skin: Negative for rash and wound.  Neurological: Negative for dizziness, weakness, light-headedness, numbness and headaches.  All other systems reviewed and are negative.    Physical Exam Updated Vital Signs BP (!) 141/86   Pulse (!) 56   Temp 98.3 F (36.8 C) (Oral)   Resp 15   Ht 5' 8.5" (1.74 m)   Wt 219 lb (99.3 kg)   SpO2 100%   BMI 32.81 kg/m   Physical Exam  Constitutional: He is oriented to person, place, and time. He appears well-developed and well-nourished. No distress.  HENT:  Head: Normocephalic and atraumatic.  Mouth/Throat: Oropharynx is clear and moist. No oropharyngeal exudate.  Eyes: EOM are normal. Pupils are equal, round, and reactive to light.  Neck: Normal range of motion. Neck supple.  Cardiovascular: Normal rate and regular rhythm.  Exam reveals no gallop and no friction rub.   No murmur heard. Pulmonary/Chest: Effort normal and breath sounds normal. No respiratory distress. He has no wheezes. He has no rales. He exhibits no tenderness.  Abdominal: Soft. Bowel sounds are normal. There is no tenderness. There is no rebound and no guarding.  Musculoskeletal: Normal range of motion. He exhibits no edema or tenderness.  No lower extremity swelling or asymmetry. Distal pulses intact.  Neurological: He is alert and oriented to person, place, and time.  5/5 motor in all extremities. Sensation fully intact.  Skin: Skin is warm and dry. Capillary refill takes less than 2 seconds. No rash noted. No erythema.  Psychiatric: He has a normal mood and affect. His behavior is normal.  Nursing note and vitals reviewed.    ED Treatments / Results  Labs (all labs ordered are listed, but only abnormal results are displayed) Labs Reviewed  BASIC METABOLIC PANEL - Abnormal; Notable for the following:       Result Value   Glucose, Bld 116 (*)    All other components within normal  limits  CBC  TROPONIN I  I-STAT TROPOININ, ED    EKG  EKG Interpretation  Date/Time:  Monday June 22 2016 10:41:11 EDT Ventricular Rate:  64 PR Interval:  188 QRS Duration: 104 QT Interval:  418 QTC Calculation: 431 R Axis:   -43 Text Interpretation:  Normal sinus rhythm Possible Left atrial enlargement Left axis deviation Incomplete right bundle branch block Inferior infarct , age undetermined Anterior infarct , age undetermined Abnormal ECG Confirmed by Ranae Palms  MD, Yasira Engelson ([id number]) on 06/22/2016 12:06:42 PM       Radiology No results found.  Procedures Procedures (including critical care time)  Medications Ordered in ED Medications  aspirin chewable tablet 324 mg (243 mg Oral Given 06/22/16 1219)     Initial Impression / Assessment and Plan / ED Course  I have reviewed the triage vital signs and the nursing notes.  Pertinent labs & imaging results that were available during my care of the patient were reviewed by me and considered in my medical decision making (see chart for details).     Patient is currently symptom-free. EKG without acute ischemic changes. Initial troponin is normal. Given 324 mg of aspirin in the emergency department. Discuss with cardiology and will evaluate in the department to determine disposition.  Final Clinical Impressions(s) / ED Diagnoses   Final diagnoses:  Precordial pain    New Prescriptions Discharge Medication List as of 06/22/2016  5:31 PM    START taking these medications   Details  amLODipine (NORVASC) 2.5 MG tablet Take 1 tablet (2.5 mg total) by mouth daily., Starting Mon 06/22/2016, Print         Loren Racer, MD 06/24/16 [phone]

## 2016-06-22 NOTE — ED Notes (Signed)
Cardiac NP has seen patient and updated about the plan of care.

## 2016-06-23 ENCOUNTER — Ambulatory Visit: Payer: BLUE CROSS/BLUE SHIELD | Admitting: Nurse Practitioner

## 2016-06-24 ENCOUNTER — Encounter: Payer: Self-pay | Admitting: Nurse Practitioner

## 2016-06-30 ENCOUNTER — Ambulatory Visit: Payer: Self-pay | Admitting: Physician Assistant

## 2016-06-30 ENCOUNTER — Encounter: Payer: Self-pay | Admitting: Physician Assistant

## 2016-06-30 NOTE — Progress Notes (Signed)
Cardiology Office Note    Date:  07/01/2016   ID:  Chris [name], DOB 1955/07/28, MRN [id number]  PCP:  Porfirio Oar, PA-C  Cardiologist: Dr. [name]  Chief Complaint: ER follow up for chest pain   History of Present Illness:   Chris [name] is a 61 y.o. male with medical history of inferior wall MI and Promus stent to RCA 2009, HLD, sleep apnea and HTN presents for follow up.   Last Myoview 09/2012 was low risk.   Seen in ER 06/22/16 for 1-2 hour episode of tightness across his chest, non-radiating and with no associated symptoms. Occurred after sweeping and resolved spontaneously. EKG without ischemic changes. CXR without abnormality. Symptoms different than prior MI. Troponin x 2 negative. Amlodipine 2.5mg  added for elevated BP. Ambualted well and discharged from ER.   Here today for follow up. Intermittent chest pain that has been improving. He works at [employer]. 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 The patient denies nausea, vomiting, fever, palpitations,  orthopnea, PND, dizziness, syncope, cough, congestion, abdominal pain, hematochezia, melena, lower extremity edema.   Past Medical History:  Diagnosis Date  . Acute MI, inferior wall, initial episode of care [facility]) 2009  . Allergy   . Coronary atherosclerosis of unspecified type of vessel, native or graft   . Other and unspecified hyperlipidemia   . Sleep apnea     Past Surgical History:  Procedure Laterality Date  . CHOLECYSTECTOMY    . CORONARY STENT PLACEMENT  2009    2.75 x 28 mm Promus stent to the RCA  . ORIF ANKLE FRACTURE Left 11/27/2015   Procedure: OPEN REDUCTION INTERNAL FIXATION (ORIF) ANKLE FRACTURE;  Surgeon: [name], MD;  Location: [facility];  Service: Orthopedics;  Laterality: Left;    Current Medications: Prior to Admission medications   Medication Sig Start Date End Date Taking? Authorizing Provider  amLODipine (NORVASC) 2.5 MG tablet  Take 1 tablet (2.5 mg total) by mouth daily. 06/22/16   Vanetta Mulders, MD  aspirin 81 MG tablet Take 81 mg by mouth daily.     Historical Provider, MD  atorvastatin (LIPITOR) 40 MG tablet Take 1 tablet (40 mg total) by mouth daily. 12/11/15   Vesta Mixer, MD  Azelastine HCl 0.15 % SOLN Place 2 sprays into both nostrils 2 (two) times daily. Patient not taking: Reported on 06/22/2016 06/11/16   Porfirio Oar, PA-C  Chlorphen-Phenyleph-APAP (CORICIDIN D COLD/FLU/SINUS) 2-5-325 MG TABS Take 1 tablet by mouth daily as needed (sinus pain).    Historical Provider, MD  clopidogrel (PLAVIX) 75 MG tablet Take 1 tablet (75 mg total) by mouth daily. 11/19/15   Vesta Mixer, MD  guaiFENesin (MUCINEX) 600 MG 12 hr tablet Take 600 mg by mouth 2 (two) times daily as needed for cough.    Historical Provider, MD  metoprolol succinate (TOPROL-XL) 25 MG 24 hr tablet TAKE 1/2 TABLET BY MOUTH 2 TIMES DAILY 11/11/15   Vesta Mixer, MD  Multiple Vitamin (MULTIVITAMIN) tablet Take 1 tablet by mouth daily.    Historical Provider, MD  Omega-3 Fatty Acids (FISH OIL) 1200 MG CAPS Take 2 capsules by mouth 2 (two) times daily at 10 AM and 5 PM.     Historical Provider, MD    Allergies:   Ace inhibitors and Ropinirole hcl   Social History   Social History  . Marital status: Married    Spouse name: N/A  . Number of children: N/A  .  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 Smoking status: Former Smoker    Quit date: 08/22/2007  . Smokeless tobacco: Never Used  . Alcohol use 0.0 oz/week     Comment: very little  . Drug use: No  . Sexual activity: Yes   Other Topics Concern  . None   Social History Narrative   Lives with wife. Exercises occasionally.     Family History:  The patient's family history includes CAD in his brother; Heart disease in his mother; Hypertension in his father; Prostate cancer in his brother.   ROS:     Please see the history of present illness.    ROS All other systems reviewed and are negative.   PHYSICAL EXAM:   VS:  BP 136/82   Pulse 66   Resp 16   Ht  (1.753 m)   Wt 216 lb (98 kg)   BMI 31.90 kg/m    GEN: Well nourished, well developed, in no acute distress  HEENT: normal  Neck: no JVD, carotid bruits, or masses Cardiac: RRR; no murmurs, rubs, or gallops,no edema  Respiratory:  clear to auscultation bilaterally, normal work of breathing GI: soft, nontender, nondistended, + BS MS: no deformity or atrophy  Skin: warm and dry, no rash Neuro:  Alert and Oriented x 3, Strength and sensation are intact Psych: euthymic mood, full affect  Wt Readings from Last 3 Encounters:  07/01/16 216 lb (98 kg)  06/22/16 219 lb (99.3 kg)  06/22/16 219 lb (99.3 kg)      Studies/Labs Reviewed:   EKG:  EKG is not ordered today.    Recent Labs: 11/11/2015: ALT 27 06/22/2016: BUN 7; Creatinine, Ser 0.87; Hemoglobin 14.4; Platelets 343; Potassium 3.8; Sodium 138   Lipid Panel    Component Value Date/Time   CHOL 105 (L) 11/11/2015 0734   TRIG 106 11/11/2015 0734   HDL 39 (L) 11/11/2015 0734   CHOLHDL 2.7 11/11/2015 0734   VLDL 21 11/11/2015 0734   LDLCALC 45 11/11/2015 0734    Additional studies/ records that were reviewed today include:   As above  Cath 08/2007 left main.  The left main is fairly large.  It is ectatic.   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  There was diffuse 20-30% stenosis in the LAD but the vessel is ectatic.  The mid vessel has 30-40% diffuse stenosis.   The first diagonal artery is a very relatively large vessel and has  minor luminal irregularities.   The left circumflex artery is a large vessel.  There are diffuse 20-30%  stenosis.  The first obtuse marginal artery is a very large and  branching vessel.  There was diffuse 20% stenosis throughout the course  of this.   The right coronary artery is 100%  occluded.   The left ventriculogram was performed in a 30 RAO position.  It reveals  hypokinesis of the inferior base, but overall still well-preserved left  ventricular systolic function.  The ejection fraction is probably 55-  60%.   PCI.  The right coronary artery was engaged using a Judkins 6-French JR-  4 side-hole guide.  A Prowater wire was passed down to the distal right  coronary artery.  Angiomax was given and ACT was 412.  He had been  previously given Plavix 600 mg p.o. in the [facility] Emergency Room.   A 2.5  x 12-mm apex was passed down across the mid lesion.  It was  inflated up to 6 atmospheres for 15 seconds and then 6 atmospheres for  20 seconds.  When we achieved reperfusion, he started having some  bradycardia so 1 mg of atropine was given.  We also used balloon  inflations to keep the heart rate up.   It was then pulled proximally and inflated up to 6 atmospheres for 45  seconds and then another with 6 atmospheres for 45 seconds.  After this  slow reperfusion, the patient's heart rate stabilized and we were able  to pull this balloon out and start with a stenting.   A 2.75 x 28-mm PROMUS stent was positioned in the mid vessel.  It was  deployed at 12 atmospheres for 25 seconds.  At this point, a 3.0 x 15-mm  PROMUS stent was placed in the proximal right coronary artery.  It was  deployed at 14 atmospheres for 25 seconds.   Poststent dilatation was achieved using a 3.25 x 20-mm Quantum Monorail.  It was positioned in the mid vessel and it was inflated up to 12  atmospheres for 20 seconds.  It was then pulled back to the proximal  vessel and was inflated up to 14 atmospheres for 20 seconds and then an  additional 16 atmospheres for 10 seconds.  This gave [place] a very nice  angiographic result.  He does have a small aneurysmal area in the  proximal/mid right coronary artery that appears not to be quite opposed  to the stent wall.  It would be impossible to  actually oppose the stent  because of the large mismatch in size.  We will have to keep him on  Plavix and hopefully this will not be a problem.  He is pain free and  stable leaving the cath lab.  He has a lot of trouble with restless legs  and so we have decided to use Angio-Seal closure device.     ASSESSMENT & PLAN:    1. Chest pain with hx of CAD s/p RCA stent in 2009 - Low risk myoview in 2014. His chest pain is atypical. Improving after normalization of BP. Will continue current regimen for medical management. He will let us know if worsening of symptoms. Trial of SL nitro.   2. HTN - Stable on current medication. Initial BP of 136/82. Repeat check 136/80.   3. HLD - 11/11/2015: Cholesterol 105; HDL 39; LDL Cholesterol 45; Triglycerides 106; VLDL 21  - Continue statin   F/u with Dr. [name] in 4 months.     Medication Adjustments/Labs and Tests Ordered: Current medicines are reviewed at length with the patient today.  Concerns regarding medicines are outlined above.  Medication changes, Labs and Tests ordered today are listed in the Patient Instructions below. There are no Patient Instructions on file for this visit.   Lorelei Pont, [place]  07/01/2016 10:38 AM    [facility] [address] [place], [place], [place]  [address] Phone: [phone]; Fax: [phone]

## 2016-07-01 ENCOUNTER — Other Ambulatory Visit: Payer: Self-pay | Admitting: *Deleted

## 2016-07-01 ENCOUNTER — Encounter: Payer: Self-pay | Admitting: Physician Assistant

## 2016-07-01 ENCOUNTER — Ambulatory Visit (INDEPENDENT_AMBULATORY_CARE_PROVIDER_SITE_OTHER): Payer: BLUE CROSS/BLUE SHIELD | Admitting: Physician Assistant

## 2016-07-01 VITALS — BP 136/82 | HR 66 | Resp 16 | Ht 69.0 in | Wt 216.0 lb

## 2016-07-01 DIAGNOSIS — I2510 Atherosclerotic heart disease of native coronary artery without angina pectoris: Secondary | ICD-10-CM | POA: Diagnosis not present

## 2016-07-01 DIAGNOSIS — R0789 Other chest pain: Secondary | ICD-10-CM

## 2016-07-01 DIAGNOSIS — E785 Hyperlipidemia, unspecified: Secondary | ICD-10-CM

## 2016-07-01 DIAGNOSIS — I10 Essential (primary) hypertension: Secondary | ICD-10-CM | POA: Diagnosis not present

## 2016-07-01 MED ORDER — NITROGLYCERIN 0.4 MG SL SUBL: 0.4000 mg | SUBLINGUAL_TABLET | SUBLINGUAL | 3 refills | Status: DC | PRN

## 2016-07-01 MED ORDER — AMLODIPINE BESYLATE 2.5 MG PO TABS: 2.5000 mg | ORAL_TABLET | Freq: Every day | ORAL | 1 refills | Status: DC

## 2016-07-01 NOTE — Patient Instructions (Signed)
Medication Instructions:  None Ordered   Labwork: None Ordered   Testing/Procedures: None Ordered   Follow-Up: Your physician recommends that you schedule a follow-up appointment in: 4 months with Dr. [name].   Any Other Special Instructions Will Be Listed Below (If Applicable).     If you need a refill on your cardiac medications before your next appointment, please call your pharmacy.

## 2016-07-08 ENCOUNTER — Other Ambulatory Visit: Payer: Self-pay | Admitting: Physician Assistant

## 2016-07-08 DIAGNOSIS — J309 Allergic rhinitis, unspecified: Secondary | ICD-10-CM

## 2016-07-13 DIAGNOSIS — M25775 Osteophyte, left foot: Secondary | ICD-10-CM | POA: Diagnosis not present

## 2016-07-13 DIAGNOSIS — L600 Ingrowing nail: Secondary | ICD-10-CM | POA: Diagnosis not present

## 2016-07-16 ENCOUNTER — Telehealth: Payer: Self-pay | Admitting: Cardiovascular Disease

## 2016-07-16 NOTE — Telephone Encounter (Signed)
Follow Up:  Please call,pt says he talked to you earlier.

## 2016-07-16 NOTE — Telephone Encounter (Signed)
Spoke with patient who states he needs to have surgery on his toe. He states the surgeon's office says they have faxed clearance. I advised that I have not received the clearance and advised that the office fax it to [phone] ATTN: Dr. [name]. Patient verbalized understanding and thanked me for the call.

## 2016-07-17 NOTE — Telephone Encounter (Signed)
Clearance signed and placed in HIM to be faxed to surgeon's office on 4/26

## 2016-07-20 DIAGNOSIS — L600 Ingrowing nail: Secondary | ICD-10-CM | POA: Diagnosis not present

## 2016-07-22 ENCOUNTER — Other Ambulatory Visit: Payer: Self-pay | Admitting: *Deleted

## 2016-07-22 ENCOUNTER — Other Ambulatory Visit: Payer: Self-pay | Admitting: Physician Assistant

## 2016-07-22 MED ORDER — METOPROLOL SUCCINATE ER 25 MG PO TB24: ORAL_TABLET | ORAL | 0 refills | Status: DC

## 2016-09-16 ENCOUNTER — Emergency Department (HOSPITAL_COMMUNITY): Payer: 59 | Admitting: Anesthesiology

## 2016-09-16 ENCOUNTER — Inpatient Hospital Stay (HOSPITAL_COMMUNITY)
Admission: EM | Admit: 2016-09-16 | Discharge: 2016-09-19 | DRG: 336 | Disposition: A | Discharge status: Home / Self Care | Payer: 59 | Attending: General Surgery | Admitting: General Surgery

## 2016-09-16 ENCOUNTER — Ambulatory Visit (INDEPENDENT_AMBULATORY_CARE_PROVIDER_SITE_OTHER): Payer: 59 | Admitting: Physician Assistant

## 2016-09-16 ENCOUNTER — Ambulatory Visit (HOSPITAL_COMMUNITY)
Admission: RE | Admit: 2016-09-16 | Discharge: 2016-09-16 | Disposition: A | Discharge status: Home / Self Care | Payer: 59 | Source: Ambulatory Visit | Attending: Physician Assistant | Admitting: Physician Assistant

## 2016-09-16 ENCOUNTER — Encounter: Payer: Self-pay | Admitting: Physician Assistant

## 2016-09-16 ENCOUNTER — Other Ambulatory Visit: Payer: Self-pay | Admitting: Physician Assistant

## 2016-09-16 ENCOUNTER — Encounter (HOSPITAL_COMMUNITY): Admission: EM | Disposition: A | Discharge status: Home / Self Care | Payer: Self-pay | Source: Home / Self Care

## 2016-09-16 ENCOUNTER — Encounter (HOSPITAL_COMMUNITY): Payer: Self-pay | Admitting: *Deleted

## 2016-09-16 VITALS — BP 148/88 | HR 70 | Temp 98.2°F | Ht 68.25 in | Wt 210.0 lb

## 2016-09-16 DIAGNOSIS — R110 Nausea: Secondary | ICD-10-CM | POA: Diagnosis not present

## 2016-09-16 DIAGNOSIS — I252 Old myocardial infarction: Secondary | ICD-10-CM

## 2016-09-16 DIAGNOSIS — D72829 Elevated white blood cell count, unspecified: Secondary | ICD-10-CM

## 2016-09-16 DIAGNOSIS — Z7982 Long term (current) use of aspirin: Secondary | ICD-10-CM

## 2016-09-16 DIAGNOSIS — E785 Hyperlipidemia, unspecified: Secondary | ICD-10-CM | POA: Diagnosis present

## 2016-09-16 DIAGNOSIS — K3580 Unspecified acute appendicitis: Secondary | ICD-10-CM | POA: Diagnosis not present

## 2016-09-16 DIAGNOSIS — R1031 Right lower quadrant pain: Secondary | ICD-10-CM

## 2016-09-16 DIAGNOSIS — Z7902 Long term (current) use of antithrombotics/antiplatelets: Secondary | ICD-10-CM

## 2016-09-16 DIAGNOSIS — Z6833 Body mass index (BMI) 33.0-33.9, adult: Secondary | ICD-10-CM

## 2016-09-16 DIAGNOSIS — E663 Overweight: Secondary | ICD-10-CM | POA: Diagnosis present

## 2016-09-16 DIAGNOSIS — G4733 Obstructive sleep apnea (adult) (pediatric): Secondary | ICD-10-CM | POA: Diagnosis not present

## 2016-09-16 DIAGNOSIS — I2510 Atherosclerotic heart disease of native coronary artery without angina pectoris: Secondary | ICD-10-CM | POA: Diagnosis present

## 2016-09-16 DIAGNOSIS — K3589 Other acute appendicitis: Principal | ICD-10-CM | POA: Diagnosis present

## 2016-09-16 DIAGNOSIS — Z9049 Acquired absence of other specified parts of digestive tract: Secondary | ICD-10-CM

## 2016-09-16 DIAGNOSIS — K660 Peritoneal adhesions (postprocedural) (postinfection): Secondary | ICD-10-CM | POA: Diagnosis present

## 2016-09-16 DIAGNOSIS — R1110 Vomiting, unspecified: Secondary | ICD-10-CM | POA: Diagnosis not present

## 2016-09-16 DIAGNOSIS — Z955 Presence of coronary angioplasty implant and graft: Secondary | ICD-10-CM

## 2016-09-16 DIAGNOSIS — Z87891 Personal history of nicotine dependence: Secondary | ICD-10-CM

## 2016-09-16 DIAGNOSIS — Z888 Allergy status to other drugs, medicaments and biological substances status: Secondary | ICD-10-CM

## 2016-09-16 DIAGNOSIS — I25110 Atherosclerotic heart disease of native coronary artery with unstable angina pectoris: Secondary | ICD-10-CM | POA: Diagnosis not present

## 2016-09-16 DIAGNOSIS — D62 Acute posthemorrhagic anemia: Secondary | ICD-10-CM | POA: Diagnosis not present

## 2016-09-16 HISTORY — DX: Gastro-esophageal reflux disease without esophagitis: K21.9

## 2016-09-16 HISTORY — DX: Essential (primary) hypertension: I10

## 2016-09-16 HISTORY — DX: Pure hypercholesterolemia, unspecified: E78.00

## 2016-09-16 HISTORY — PX: LAPAROSCOPIC APPENDECTOMY: SHX408

## 2016-09-16 HISTORY — DX: Other seasonal allergic rhinitis: J30.2

## 2016-09-16 LAB — CBC
HCT: 41.6 % (ref 39.0–52.0)
Hemoglobin: 14.1 g/dL (ref 13.0–17.0)
MCH: 29.8 pg (ref 26.0–34.0)
MCHC: 33.9 g/dL (ref 30.0–36.0)
MCV: 87.9 fL (ref 78.0–100.0)
Platelets: 272 10*3/uL (ref 150–400)
RBC: 4.73 MIL/uL (ref 4.22–5.81)
RDW: 12.8 % (ref 11.5–15.5)
WBC: 15.1 10*3/uL — AB (ref 4.0–10.5)

## 2016-09-16 LAB — POC MICROSCOPIC URINALYSIS (UMFC)

## 2016-09-16 LAB — POCT CBC
Granulocyte percent: 77.3 %G (ref 37–80)
HCT, POC: 40.9 % — AB (ref 43.5–53.7)
Hemoglobin: 14.2 g/dL (ref 14.1–18.1)
Lymph, poc: 2.2 (ref 0.6–3.4)
MCH, POC: 29.8 pg (ref 27–31.2)
MCHC: 34.7 g/dL (ref 31.8–35.4)
MCV: 85.9 fL (ref 80–97)
MID (cbc): 0.9 (ref 0–0.9)
MPV: 6.9 fL (ref 0–99.8)
POC Granulocyte: 10.6 — AB (ref 2–6.9)
POC LYMPH PERCENT: 16.3 %L (ref 10–50)
POC MID %: 6.4 % (ref 0–12)
Platelet Count, POC: 321 10*3/uL (ref 142–424)
RBC: 4.76 M/uL (ref 4.69–6.13)
RDW, POC: 12.9 %
WBC: 13.7 10*3/uL — AB (ref 4.6–10.2)

## 2016-09-16 LAB — CMP14+EGFR
ALT: 33 IU/L (ref 0–44)
AST: 30 IU/L (ref 0–40)
Albumin/Globulin Ratio: 1.8 (ref 1.2–2.2)
Albumin: 4.4 g/dL (ref 3.6–4.8)
Alkaline Phosphatase: 86 IU/L (ref 39–117)
BUN/Creatinine Ratio: 12 (ref 10–24)
BUN: 10 mg/dL (ref 8–27)
Bilirubin Total: 0.5 mg/dL (ref 0.0–1.2)
CO2: 25 mmol/L (ref 20–29)
Calcium: 9.5 mg/dL (ref 8.6–10.2)
Chloride: 101 mmol/L (ref 96–106)
Creatinine, Ser: 0.86 mg/dL (ref 0.76–1.27)
GFR calc Af Amer: 108 mL/min/{1.73_m2} (ref >59)
GFR calc non Af Amer: 94 mL/min/{1.73_m2} (ref >59)
Globulin, Total: 2.5 g/dL (ref 1.5–4.5)
Glucose: 110 mg/dL — ABNORMAL HIGH (ref 65–99)
Potassium: 4.9 mmol/L (ref 3.5–5.2)
Sodium: 139 mmol/L (ref 134–144)
Total Protein: 6.9 g/dL (ref 6.0–8.5)

## 2016-09-16 LAB — COMPREHENSIVE METABOLIC PANEL
ALBUMIN: 4 g/dL (ref 3.5–5.0)
ALK PHOS: 77 U/L (ref 38–126)
ALT: 35 U/L (ref 17–63)
ANION GAP: 7 (ref 5–15)
AST: 32 U/L (ref 15–41)
BILIRUBIN TOTAL: 1.3 mg/dL — AB (ref 0.3–1.2)
BUN: 7 mg/dL (ref 6–20)
CALCIUM: 9 mg/dL (ref 8.9–10.3)
CO2: 25 mmol/L (ref 22–32)
Chloride: 100 mmol/L — ABNORMAL LOW (ref 101–111)
Creatinine, Ser: 0.81 mg/dL (ref 0.61–1.24)
GFR calc Af Amer: >60 mL/min (ref >60)
GLUCOSE: 117 mg/dL — AB (ref 65–99)
Potassium: 4 mmol/L (ref 3.5–5.1)
Sodium: 132 mmol/L — ABNORMAL LOW (ref 135–145)
TOTAL PROTEIN: 6.9 g/dL (ref 6.5–8.1)

## 2016-09-16 LAB — URINALYSIS, ROUTINE W REFLEX MICROSCOPIC
BILIRUBIN URINE: NEGATIVE
Glucose, UA: NEGATIVE mg/dL
Hgb urine dipstick: NEGATIVE
Ketones, ur: NEGATIVE mg/dL
Leukocytes, UA: NEGATIVE
NITRITE: NEGATIVE
PROTEIN: NEGATIVE mg/dL
Specific Gravity, Urine: 1.029 (ref 1.005–1.030)
pH: 7 (ref 5.0–8.0)

## 2016-09-16 LAB — POCT URINALYSIS DIP (MANUAL ENTRY)
Bilirubin, UA: NEGATIVE
Blood, UA: NEGATIVE
Glucose, UA: NEGATIVE mg/dL
Ketones, POC UA: NEGATIVE mg/dL
Leukocytes, UA: NEGATIVE
Nitrite, UA: NEGATIVE
Protein Ur, POC: NEGATIVE mg/dL
Spec Grav, UA: >=1.03 — AB (ref 1.010–1.025)
Urobilinogen, UA: 0.2 U/dL
pH, UA: 5.5 (ref 5.0–8.0)

## 2016-09-16 LAB — POCT I-STAT CREATININE: Creatinine, Ser: 0.7 mg/dL (ref 0.61–1.24)

## 2016-09-16 LAB — LIPASE, BLOOD: Lipase: 27 U/L (ref 11–51)

## 2016-09-16 SURGERY — APPENDECTOMY, LAPAROSCOPIC
Anesthesia: General | Site: Abdomen

## 2016-09-16 MED ORDER — HYDROMORPHONE HCL 1 MG/ML IJ SOLN
1.0000 mg | INTRAMUSCULAR | Status: DC | PRN
  Administered 2016-09-17 (×2): 1 mg via INTRAVENOUS
  Filled 2016-09-16 (×2): qty 1

## 2016-09-16 MED ORDER — ONDANSETRON HCL 4 MG/2ML IJ SOLN
4.0000 mg | Freq: Four times a day (QID) | INTRAMUSCULAR | Status: DC | PRN
  Administered 2016-09-17 (×2): 4 mg via INTRAVENOUS
  Filled 2016-09-16 (×2): qty 2

## 2016-09-16 MED ORDER — LIDOCAINE 2% (20 MG/ML) 5 ML SYRINGE
INTRAMUSCULAR | Status: AC
  Filled 2016-09-16: qty 5

## 2016-09-16 MED ORDER — BUPIVACAINE-EPINEPHRINE 0.25% -1:200000 IJ SOLN
INTRAMUSCULAR | Status: DC | PRN
  Administered 2016-09-16: 13 mL

## 2016-09-16 MED ORDER — LACTATED RINGERS IV SOLN
INTRAVENOUS | Status: DC | PRN
  Administered 2016-09-16: 18:00:00 via INTRAVENOUS

## 2016-09-16 MED ORDER — METRONIDAZOLE IN NACL 5-0.79 MG/ML-% IV SOLN
500.0000 mg | Freq: Three times a day (TID) | INTRAVENOUS | Status: AC
  Administered 2016-09-17 (×2): 500 mg via INTRAVENOUS
  Filled 2016-09-16 (×2): qty 100

## 2016-09-16 MED ORDER — SODIUM CHLORIDE 0.9 % IV SOLN
INTRAVENOUS | Status: DC
  Administered 2016-09-17 – 2016-09-18 (×3): via INTRAVENOUS

## 2016-09-16 MED ORDER — METOPROLOL SUCCINATE ER 25 MG PO TB24
12.5000 mg | ORAL_TABLET | Freq: Every day | ORAL | Status: DC
  Administered 2016-09-17 – 2016-09-18 (×2): 12.5 mg via ORAL
  Filled 2016-09-16 (×2): qty 1

## 2016-09-16 MED ORDER — ADULT MULTIVITAMIN W/MINERALS CH
1.0000 | ORAL_TABLET | Freq: Every day | ORAL | Status: DC
  Administered 2016-09-17 – 2016-09-18 (×2): 1 via ORAL
  Filled 2016-09-16 (×2): qty 1

## 2016-09-16 MED ORDER — IOPAMIDOL (ISOVUE-300) INJECTION 61%
INTRAVENOUS | Status: AC
  Administered 2016-09-16: 100 mL
  Filled 2016-09-16: qty 30

## 2016-09-16 MED ORDER — ONDANSETRON HCL 4 MG/2ML IJ SOLN: 4.0000 mg | Freq: Once | INTRAMUSCULAR | Status: DC | PRN

## 2016-09-16 MED ORDER — PROCHLORPERAZINE EDISYLATE 5 MG/ML IJ SOLN
5.0000 mg | Freq: Four times a day (QID) | INTRAMUSCULAR | Status: DC | PRN
  Administered 2016-09-17: 10 mg via INTRAVENOUS
  Filled 2016-09-16: qty 2

## 2016-09-16 MED ORDER — FENTANYL CITRATE (PF) 250 MCG/5ML IJ SOLN
INTRAMUSCULAR | Status: AC
  Filled 2016-09-16: qty 5

## 2016-09-16 MED ORDER — DEXTROSE 5 % IV SOLN
2.0000 g | Freq: Once | INTRAVENOUS | Status: AC
  Administered 2016-09-16: 2 g via INTRAVENOUS
  Filled 2016-09-16: qty 2

## 2016-09-16 MED ORDER — ENOXAPARIN SODIUM 40 MG/0.4ML ~~LOC~~ SOLN
40.0000 mg | SUBCUTANEOUS | Status: DC
  Administered 2016-09-17: 40 mg via SUBCUTANEOUS
  Filled 2016-09-16: qty 0.4

## 2016-09-16 MED ORDER — ONDANSETRON HCL 4 MG/2ML IJ SOLN
4.0000 mg | Freq: Once | INTRAMUSCULAR | Status: DC
  Filled 2016-09-16: qty 2

## 2016-09-16 MED ORDER — ONDANSETRON HCL 4 MG/2ML IJ SOLN
INTRAMUSCULAR | Status: AC
  Filled 2016-09-16: qty 2

## 2016-09-16 MED ORDER — BUPIVACAINE-EPINEPHRINE (PF) 0.25% -1:200000 IJ SOLN
INTRAMUSCULAR | Status: AC
  Filled 2016-09-16: qty 30

## 2016-09-16 MED ORDER — ACETAMINOPHEN 500 MG PO TABS
1000.0000 mg | ORAL_TABLET | Freq: Four times a day (QID) | ORAL | Status: DC
  Administered 2016-09-17 – 2016-09-19 (×7): 1000 mg via ORAL
  Filled 2016-09-16 (×8): qty 2

## 2016-09-16 MED ORDER — PROPOFOL 10 MG/ML IV BOLUS
INTRAVENOUS | Status: DC | PRN
  Administered 2016-09-16: 200 mg via INTRAVENOUS

## 2016-09-16 MED ORDER — ROCURONIUM BROMIDE 10 MG/ML (PF) SYRINGE
PREFILLED_SYRINGE | INTRAVENOUS | Status: AC
  Filled 2016-09-16: qty 5

## 2016-09-16 MED ORDER — MORPHINE SULFATE (PF) 4 MG/ML IV SOLN
4.0000 mg | Freq: Once | INTRAVENOUS | Status: DC
  Filled 2016-09-16: qty 1

## 2016-09-16 MED ORDER — METRONIDAZOLE IN NACL 5-0.79 MG/ML-% IV SOLN
500.0000 mg | Freq: Once | INTRAVENOUS | Status: AC
  Administered 2016-09-16: 500 mg via INTRAVENOUS
  Filled 2016-09-16: qty 100

## 2016-09-16 MED ORDER — ATORVASTATIN CALCIUM 40 MG PO TABS
40.0000 mg | ORAL_TABLET | Freq: Every day | ORAL | Status: DC
  Administered 2016-09-17 – 2016-09-18 (×2): 40 mg via ORAL
  Filled 2016-09-16 (×2): qty 1

## 2016-09-16 MED ORDER — PROPOFOL 10 MG/ML IV BOLUS
INTRAVENOUS | Status: AC
  Filled 2016-09-16: qty 40

## 2016-09-16 MED ORDER — ONDANSETRON HCL 4 MG/2ML IJ SOLN
INTRAMUSCULAR | Status: DC | PRN
  Administered 2016-09-16: 4 mg via INTRAVENOUS

## 2016-09-16 MED ORDER — NITROGLYCERIN 0.4 MG SL SUBL: 0.4000 mg | SUBLINGUAL_TABLET | SUBLINGUAL | Status: DC | PRN

## 2016-09-16 MED ORDER — SODIUM CHLORIDE 0.9 % IR SOLN
Status: DC | PRN
  Administered 2016-09-16: 3000 mL

## 2016-09-16 MED ORDER — 0.9 % SODIUM CHLORIDE (POUR BTL) OPTIME
TOPICAL | Status: DC | PRN
  Administered 2016-09-16: 1000 mL

## 2016-09-16 MED ORDER — SUCCINYLCHOLINE CHLORIDE 20 MG/ML IJ SOLN
INTRAMUSCULAR | Status: DC | PRN
  Administered 2016-09-16: 100 mg via INTRAVENOUS

## 2016-09-16 MED ORDER — LIDOCAINE HCL (CARDIAC) 20 MG/ML IV SOLN
INTRAVENOUS | Status: DC | PRN
  Administered 2016-09-16: 50 mg via INTRAVENOUS

## 2016-09-16 MED ORDER — OXYCODONE HCL 5 MG PO TABS: 5.0000 mg | ORAL_TABLET | ORAL | Status: DC | PRN

## 2016-09-16 MED ORDER — ROCURONIUM BROMIDE 100 MG/10ML IV SOLN
INTRAVENOUS | Status: DC | PRN
  Administered 2016-09-16: 30 mg via INTRAVENOUS
  Administered 2016-09-16: 10 mg via INTRAVENOUS

## 2016-09-16 MED ORDER — DEXAMETHASONE SODIUM PHOSPHATE 10 MG/ML IJ SOLN
INTRAMUSCULAR | Status: DC | PRN
  Administered 2016-09-16: 10 mg via INTRAVENOUS

## 2016-09-16 MED ORDER — DOCUSATE SODIUM 100 MG PO CAPS
100.0000 mg | ORAL_CAPSULE | Freq: Two times a day (BID) | ORAL | Status: DC
  Administered 2016-09-17 – 2016-09-18 (×4): 100 mg via ORAL
  Filled 2016-09-16 (×3): qty 1

## 2016-09-16 MED ORDER — FENTANYL CITRATE (PF) 100 MCG/2ML IJ SOLN
INTRAMUSCULAR | Status: DC | PRN
  Administered 2016-09-16: 50 ug via INTRAVENOUS
  Administered 2016-09-16 (×2): 100 ug via INTRAVENOUS

## 2016-09-16 MED ORDER — CEFTRIAXONE SODIUM 2 G IJ SOLR
2.0000 g | INTRAMUSCULAR | Status: AC
  Administered 2016-09-17: 2 g via INTRAVENOUS
  Filled 2016-09-16: qty 2

## 2016-09-16 MED ORDER — ONDANSETRON 4 MG PO TBDP: 4.0000 mg | ORAL_TABLET | Freq: Four times a day (QID) | ORAL | Status: DC | PRN

## 2016-09-16 MED ORDER — FENTANYL CITRATE (PF) 100 MCG/2ML IJ SOLN: 25.0000 ug | INTRAMUSCULAR | Status: DC | PRN

## 2016-09-16 MED ORDER — IOPAMIDOL (ISOVUE-300) INJECTION 61%
INTRAVENOUS | Status: AC
  Filled 2016-09-16: qty 100

## 2016-09-16 MED ORDER — SUGAMMADEX SODIUM 200 MG/2ML IV SOLN
INTRAVENOUS | Status: AC
  Filled 2016-09-16: qty 2

## 2016-09-16 MED ORDER — PROCHLORPERAZINE MALEATE 10 MG PO TABS
10.0000 mg | ORAL_TABLET | Freq: Four times a day (QID) | ORAL | Status: DC | PRN
  Filled 2016-09-16: qty 1

## 2016-09-16 MED ORDER — SUGAMMADEX SODIUM 200 MG/2ML IV SOLN
INTRAVENOUS | Status: DC | PRN
  Administered 2016-09-16: 190.6 mg via INTRAVENOUS

## 2016-09-16 MED ORDER — LACTATED RINGERS IV SOLN
INTRAVENOUS | Status: DC
Start: 2016-09-16 — End: 2016-09-16
  Administered 2016-09-16: 19:00:00 via INTRAVENOUS

## 2016-09-16 SURGICAL SUPPLY — 45 items
ADH SKN CLS APL DERMABOND .7 (GAUZE/BANDAGES/DRESSINGS) ×1
APPLIER CLIP ROT 10 11.4 M/L (STAPLE)
APR CLP MED LRG 11.4X10 (STAPLE)
BAG SPEC RTRVL LRG 6X4 10 (ENDOMECHANICALS) ×1
BLADE CLIPPER SURG (BLADE) ×1 IMPLANT
CANISTER SUCT 3000ML PPV (MISCELLANEOUS) ×2 IMPLANT
CHLORAPREP W/TINT 26ML (MISCELLANEOUS) ×2 IMPLANT
CLIP APPLIE ROT 10 11.4 M/L (STAPLE) IMPLANT
COVER SURGICAL LIGHT HANDLE (MISCELLANEOUS) ×2 IMPLANT
CUTTER FLEX LINEAR 45M (STAPLE) ×2 IMPLANT
DERMABOND ADVANCED (GAUZE/BANDAGES/DRESSINGS) ×1
DERMABOND ADVANCED .7 DNX12 (GAUZE/BANDAGES/DRESSINGS) ×1 IMPLANT
DRSG TEGADERM 2-3/8X2-3/4 SM (GAUZE/BANDAGES/DRESSINGS) ×6 IMPLANT
ELECT REM PT RETURN 9FT ADLT (ELECTROSURGICAL) ×2
ELECTRODE REM PT RTRN 9FT ADLT (ELECTROSURGICAL) ×1 IMPLANT
ENDOLOOP SUT PDS II  0 18 (SUTURE)
ENDOLOOP SUT PDS II 0 18 (SUTURE) IMPLANT
GLOVE BIOGEL PI IND STRL 8 (GLOVE) ×1 IMPLANT
GLOVE BIOGEL PI INDICATOR 8 (GLOVE) ×1
GLOVE ECLIPSE 7.5 STRL STRAW (GLOVE) ×2 IMPLANT
GOWN STRL REUS W/ TWL LRG LVL3 (GOWN DISPOSABLE) ×3 IMPLANT
GOWN STRL REUS W/TWL LRG LVL3 (GOWN DISPOSABLE) ×6
KIT BASIN OR (CUSTOM PROCEDURE TRAY) ×2 IMPLANT
KIT ROOM TURNOVER OR (KITS) ×2 IMPLANT
NS IRRIG 1000ML POUR BTL (IV SOLUTION) ×2 IMPLANT
PAD ARMBOARD 7.5X6 YLW CONV (MISCELLANEOUS) ×4 IMPLANT
POUCH SPECIMEN RETRIEVAL 10MM (ENDOMECHANICALS) ×2 IMPLANT
RELOAD 45 VASCULAR/THIN (ENDOMECHANICALS) IMPLANT
RELOAD STAPLE 45 2.5 WHT GRN (ENDOMECHANICALS) IMPLANT
RELOAD STAPLE 45 3.5 BLU ETS (ENDOMECHANICALS) IMPLANT
RELOAD STAPLE TA45 3.5 REG BLU (ENDOMECHANICALS) ×2 IMPLANT
SCISSORS LAP 5X35 DISP (ENDOMECHANICALS) ×1 IMPLANT
SET IRRIG TUBING LAPAROSCOPIC (IRRIGATION / IRRIGATOR) ×2 IMPLANT
SHEARS HARMONIC ACE PLUS 36CM (ENDOMECHANICALS) ×2 IMPLANT
SLEEVE ENDOPATH XCEL 5M (ENDOMECHANICALS) ×3 IMPLANT
SPECIMEN JAR SMALL (MISCELLANEOUS) ×2 IMPLANT
STRIP CLOSURE SKIN 1/2X4 (GAUZE/BANDAGES/DRESSINGS) ×2 IMPLANT
SUT MNCRL AB 4-0 PS2 18 (SUTURE) ×2 IMPLANT
TOWEL OR 17X24 6PK STRL BLUE (TOWEL DISPOSABLE) ×2 IMPLANT
TOWEL OR 17X26 10 PK STRL BLUE (TOWEL DISPOSABLE) ×2 IMPLANT
TRAY FOLEY CATH SILVER 16FR (SET/KITS/TRAYS/PACK) ×2 IMPLANT
TRAY LAPAROSCOPIC MC (CUSTOM PROCEDURE TRAY) ×2 IMPLANT
TROCAR XCEL BLUNT TIP 100MML (ENDOMECHANICALS) ×2 IMPLANT
TROCAR XCEL NON-BLD 5MMX100MML (ENDOMECHANICALS) ×2 IMPLANT
TUBING INSUFFLATION (TUBING) ×2 IMPLANT

## 2016-09-16 NOTE — ED Triage Notes (Signed)
Pt reports onset of RLQ pain this am. Denies n/v/d or fever. Pt went to pcp and sent for outpatient ct scan, then called and told to come here due to +appendicitis.

## 2016-09-16 NOTE — Patient Instructions (Addendum)
CT- go to [place].   You will be contacted with the image results and plan.   Thank you for coming in today. I hope you feel we met your needs.  Feel free to call [facility] if you have any questions or further requests.  Please consider signing up for MyChart if you do not already have it, as this is a great way to communicate with me.  Best,  Whitney McVey, PA-C  IF you received an x-ray today, you will receive an invoice from [facility]. Please contact [facility] at [phone] with questions or concerns regarding your invoice.   IF you received labwork today, you will receive an invoice from [facility]. Please contact [facility] at [phone] with questions or concerns regarding your invoice.   Our billing staff will not be able to assist you with questions regarding bills from these companies.  You will be contacted with the lab results as soon as they are available. The fastest way to get your results is to activate your My Chart account. Instructions are located on the last page of this paperwork. If you have not heard from [place] regarding the results in 2 weeks, please contact this office.

## 2016-09-16 NOTE — Anesthesia Postprocedure Evaluation (Signed)
Anesthesia Post Note  Patient: Vandy E Haft  Procedure(s) Performed: Procedure(s) (LRB): APPENDECTOMY LAPAROSCOPIC (N/A)     Patient location during evaluation: PACU Anesthesia Type: General Level of consciousness: awake, awake and alert and oriented Pain management: pain level controlled Vital Signs Assessment: post-procedure vital signs reviewed and stable Respiratory status: spontaneous breathing, nonlabored ventilation and respiratory function stable Cardiovascular status: blood pressure returned to baseline Anesthetic complications: no    Last Vitals:  Vitals:   09/16/16 2115 09/16/16 2144  BP: 134/80 117/69  Pulse: 73 74  Resp: 16 18  Temp:  36.7 C    Last Pain:  Vitals:   09/16/16 2144  TempSrc: Oral  PainSc:                  Keeshawn Fakhouri COKER

## 2016-09-16 NOTE — Anesthesia Preprocedure Evaluation (Signed)
Anesthesia Evaluation  Patient identified by MRN, date of birth, ID band Patient awake    Reviewed: Allergy & Precautions, NPO status , Patient's Chart, lab work & pertinent test results  Airway Mallampati: II  TM Distance: >3 FB Neck ROM: Full    Dental  (+) Edentulous Lower, Edentulous Upper   Pulmonary former smoker,    breath sounds clear to auscultation       Cardiovascular  Rhythm:Regular Rate:Normal     Neuro/Psych    GI/Hepatic   Endo/Other    Renal/GU      Musculoskeletal   Abdominal   Peds  Hematology   Anesthesia Other Findings   Reproductive/Obstetrics                             Anesthesia Physical Anesthesia Plan  ASA: III and emergent  Anesthesia Plan: General   Post-op Pain Management:    Induction: Intravenous  PONV Risk Score and Plan: Ondansetron, Dexamethasone and Propofol  Airway Management Planned: Oral ETT  Additional Equipment:   Intra-op Plan:   Post-operative Plan: Extubation in OR  Informed Consent: I have reviewed the patients History and Physical, chart, labs and discussed the procedure including the risks, benefits and alternatives for the proposed anesthesia with the patient or authorized representative who has indicated his/her understanding and acceptance.     Plan Discussed with: CRNA and Anesthesiologist  Anesthesia Plan Comments:         Anesthesia Quick Evaluation

## 2016-09-16 NOTE — H&P (Signed)
Chris [name] is an 61 y.o. male.   Chief Complaint: Abdominal pain HPI: Awakened 5:30 AM, abdominal pain after breakfast, but did not vomit food.  Worsened after work.  Went to Urgent Care, then to radiology, then to ED waiting area for 3-4 hours.  Past Medical History:  Diagnosis Date  . Acute MI, inferior wall, initial episode of care [facility]) 2009  . Allergy   . Coronary atherosclerosis of unspecified type of vessel, native or graft   . Other and unspecified hyperlipidemia   . Sleep apnea     Past Surgical History:  Procedure Laterality Date  . CHOLECYSTECTOMY    . CORONARY STENT PLACEMENT  2009    2.75 x 28 mm Promus stent to the RCA  . ORIF ANKLE FRACTURE Left 11/27/2015   Procedure: OPEN REDUCTION INTERNAL FIXATION (ORIF) ANKLE FRACTURE;  Surgeon: Frederik Pear, MD;  Location: [facility];  Service: Orthopedics;  Laterality: Left;    Family History  Problem Relation Age of Onset  . Heart disease Mother   . Hypertension Father   . Prostate cancer Brother   . CAD Brother    Social History:  reports that he quit smoking about 9 years ago. He has never used smokeless tobacco. He reports that he drinks alcohol. He reports that he does not use drugs.  Allergies:  Allergies  Allergen Reactions  . Ace Inhibitors Rash    REACTION: Rash  . Ropinirole Hcl Rash     (Not in a hospital admission)  Results for orders placed or performed during the hospital encounter of 09/16/16 (from the past 48 hour(s))  Lipase, blood     Status: None   Collection Time: 09/16/16  3:00 PM  Result Value Ref Range   Lipase 27 11 - 51 U/L  Comprehensive metabolic panel     Status: Abnormal   Collection Time: 09/16/16  3:00 PM  Result Value Ref Range   Sodium 132 (L) 135 - 145 mmol/L   Potassium 4.0 3.5 - 5.1 mmol/L   Chloride 100 (L) 101 - 111 mmol/L   CO2 25 22 - 32 mmol/L   Glucose, Bld 117 (H) 65 - 99 mg/dL   BUN 7 6 - 20 mg/dL   Creatinine, Ser 0.81 0.61 - 1.24 mg/dL   Calcium 9.0 8.9 - 10.3 mg/dL   Total Protein 6.9 6.5 - 8.1 g/dL   Albumin 4.0 3.5 - 5.0 g/dL   AST 32 15 - 41 U/L   ALT 35 17 - 63 U/L   Alkaline Phosphatase 77 38 - 126 U/L   Total Bilirubin 1.3 (H) 0.3 - 1.2 mg/dL   GFR calc non Af Amer >60 >60 mL/min   GFR calc Af Amer >60 >60 mL/min    Comment: (NOTE) The eGFR has been calculated using the CKD EPI equation. This calculation has not been validated in all clinical situations. eGFR's persistently <60 mL/min signify possible Chronic Kidney Disease.    Anion gap 7 5 - 15  CBC     Status: Abnormal   Collection Time: 09/16/16  3:00 PM  Result Value Ref Range   WBC 15.1 (H) 4.0 - 10.5 K/uL   RBC 4.73 4.22 - 5.81 MIL/uL   Hemoglobin 14.1 13.0 - 17.0 g/dL   HCT 41.6 39.0 - 52.0 %   MCV 87.9 78.0 - 100.0 fL   MCH 29.8 26.0 - 34.0 pg   MCHC 33.9 30.0 - 36.0 g/dL   RDW 12.8 11.5 - 15.5 %  Platelets 272 150 - 400 K/uL   Ct Abdomen Pelvis W Contrast  Result Date: 09/16/2016 CLINICAL DATA:  RLQ pain since this morning. No nausea. No vomiting. EXAM: CT ABDOMEN AND PELVIS WITH CONTRAST TECHNIQUE: Multidetector CT imaging of the abdomen and pelvis was performed using the standard protocol following bolus administration of intravenous contrast. CONTRAST:  162m ISOVUE-300 IOPAMIDOL (ISOVUE-300) INJECTION 61% COMPARISON:  None. FINDINGS: Lower chest: Coronary calcifications. No pleural or pericardial effusion. Lung bases clear. Hepatobiliary: No focal liver abnormality is seen. Status post cholecystectomy. No biliary dilatation. Pancreas: Unremarkable. No pancreatic ductal dilatation or surrounding inflammatory changes. Spleen: Normal in size without focal abnormality. Adrenals/Urinary Tract: Subcentimeter probable cysts bilaterally. No solid renal lesion. No hydronephrosis. No urolithiasis. Ureters decompressed. Urinary bladder incompletely distended. Negative adrenals. Stomach/Bowel: Stomach is nondistended. Small bowel is nondilated. Retrocecal  Appendix dilated up to 9 mm diameter with some adjacent inflammatory/edematous changes in the lateral Conal fascia. No evidence of perforation or abscess. The remainder of the colon is nondilated, unremarkable. Vascular/Lymphatic: Aortoiliac calcified plaque without aneurysm or high-grade stenosis. No abdominal or pelvic adenopathy. Reproductive: Mild prostatic enlargement with central coarse calcification. Other: No ascites.  No free air. Musculoskeletal: Supraumbilical Ventral hernias containing only mesenteric fat. Degenerative disc disease L5-S1 and L4-5. Negative for fracture or worrisome bone lesion. IMPRESSION: 1. Probable acute appendicitis, without abscess or perforation. 2. Coronary and aortoiliac arterial calcifications. 3. Ventral hernias containing only mesenteric fat. These results will be called to the ordering clinician or representative by the Radiologist Assistant, and communication documented in the PACS or zVision Dashboard. Electronically Signed   By: DLucrezia EuropeM.D.   On: 09/16/2016 14:07    Review of Systems  Constitutional: Negative for chills and fever.  Gastrointestinal: Positive for abdominal pain and nausea. Negative for diarrhea and vomiting.  All other systems reviewed and are negative.   Blood pressure (!) 144/83, pulse 69, temperature 98.1 F (36.7 C), temperature source Oral, resp. rate 16, SpO2 97 %. Physical Exam  Vitals reviewed. Constitutional: He is oriented to person, place, and time. He appears well-developed and well-nourished.  HENT:  Head: Normocephalic and atraumatic.  Eyes: Conjunctivae and EOM are normal. Pupils are equal, round, and reactive to light.  Neck: Normal range of motion. Neck supple.  Cardiovascular: Normal rate, regular rhythm, normal heart sounds and intact distal pulses.  Exam reveals no gallop.   No murmur heard. Respiratory: Effort normal and breath sounds normal.  GI: Soft. Normal appearance and bowel sounds are normal. He exhibits  no distension. There is tenderness. There is guarding. There is no rebound.    Genitourinary: Penis normal.  Musculoskeletal: Normal range of motion.  Neurological: He is alert and oriented to person, place, and time. He has normal reflexes.  Skin: Skin is warm and dry.  Psychiatric: He has a normal mood and affect. His behavior is normal. Judgment and thought content normal.     Assessment/Plan Acute appendicitis without rupture on CT and clinically he is tender in the RLQ. Plan for laparoscopic appendectomy. Patient is currently taking Plavix and ASA 81  JJudeth Horn MD 09/16/2016, 6:05 PM

## 2016-09-16 NOTE — Anesthesia Procedure Notes (Signed)
Procedure Name: Intubation Date/Time: 09/16/2016 7:14 PM Performed by: Eligha Bridegroom Pre-anesthesia Checklist: Patient identified, Emergency Drugs available, Suction available and Timeout performed Patient Re-evaluated:Patient Re-evaluated prior to inductionOxygen Delivery Method: Circle system utilized Preoxygenation: Pre-oxygenation with 100% oxygen Intubation Type: IV induction, Rapid sequence and Cricoid Pressure applied Laryngoscope Size: Mac and 4 Grade View: Grade I Tube type: Oral Tube size: 7.5 mm Airway Equipment and Method: Stylet Placement Confirmation: ETT inserted through vocal cords under direct vision Secured at: 21 cm Tube secured with: Tape Dental Injury: Teeth and Oropharynx as per pre-operative assessment

## 2016-09-16 NOTE — ED Provider Notes (Signed)
[facility]-EMERGENCY DEPT Provider Note   CSN: [id number] Arrival date & time: 09/16/16  1450     History   Chief Complaint Chief Complaint  Patient presents with  . acute appendicitis    HPI Chris [name] is a 61 y.o. male.  HPI  Patient is a 61 year old male who presents to the emergency department with a one-day history of right-sided abdominal pain. Abdominal pain started this morning when he woke up, dull, right lateral abdomen. Nausea without emesis. Denies fever, chills, dysuria, diarrhea. When saw his primary care physician earlier today when his abdominal pain did not improve. Had a CT A/P as an outpatient that showed concerns for acute appendicitis so he was sent to the emergency department. Currently rates his abdominal pain is 5/10. Has not taken anything for the pain. Nothing improves or worsens. Prior cholecystectomy.  Past Medical History:  Diagnosis Date  . Acute MI, inferior wall, initial episode of care [facility]) 2009  . Allergy   . Coronary atherosclerosis of unspecified type of vessel, native or graft   . Other and unspecified hyperlipidemia   . Sleep apnea     Patient Active Problem List   Diagnosis Date Noted  . Acute appendicitis 09/16/2016  . Edentulous 06/11/2016  . Closed fracture of left lateral malleolus 11/25/2015  . Overweight 05/21/2014  . History of MI (myocardial infarction) 05/21/2014  . Unstable angina (HCC) 10/10/2012  . ED (erectile dysfunction) 07/25/2012  . RESTLESS LEGS SYNDROME 02/20/2010  . Hyperlipidemia 02/19/2010  . OBSTRUCTIVE SLEEP APNEA 02/19/2010  . CAD (coronary artery disease) 02/19/2010    Past Surgical History:  Procedure Laterality Date  . CHOLECYSTECTOMY    . CORONARY STENT PLACEMENT  2009    2.75 x 28 mm Promus stent to the RCA  . ORIF ANKLE FRACTURE Left 11/27/2015   Procedure: OPEN REDUCTION INTERNAL FIXATION (ORIF) ANKLE FRACTURE;  Surgeon: Gean BirchwoodFrank Rowan, MD;  Location: [facility];  Service: Orthopedics;   Laterality: Left;       Home Medications    Prior to Admission medications   Medication Sig Start Date End Date Taking? Authorizing Provider  aspirin 81 MG tablet Take 81 mg by mouth daily.    Yes [provider]  atorvastatin (LIPITOR) 40 MG tablet Take 1 tablet (40 mg total) by mouth daily. 12/11/15  Yes Nahser, Deloris PingPhilip J, MD  clopidogrel (PLAVIX) 75 MG tablet Take 1 tablet (75 mg total) by mouth daily. 11/19/15  Yes Nahser, Deloris PingPhilip J, MD  metoprolol succinate (TOPROL-XL) 25 MG 24 hr tablet TAKE 1/2 TABLET BY MOUTH 2 TIMES DAILY 07/22/16  Yes Nahser, Deloris PingPhilip J, MD  Multiple Vitamin (MULTIVITAMIN) tablet Take 1 tablet by mouth daily.   Yes [provider]  Omega-3 Fatty Acids (FISH OIL) 1200 MG CAPS Take 2 capsules by mouth 2 (two) times daily at 10 AM and 5 PM.    Yes [provider]  nitroGLYCERIN (NITROSTAT) 0.4 MG SL tablet Place 1 tablet (0.4 mg total) under the tongue every 5 (five) minutes as needed for chest pain. 07/01/16 09/29/16  Manson PasseyBhagat, Bhavinkumar, PA    Family History Family History  Problem Relation Age of Onset  . Heart disease Mother   . Hypertension Father   . Prostate cancer Brother   . CAD Brother     Social History Social History  Substance Use Topics  . Smoking status: Former Smoker    Quit date: 08/22/2007  . Smokeless tobacco: Never Used  . Alcohol use 0.0 oz/week  Comment: very little     Allergies   Ace inhibitors and Ropinirole hcl   Review of Systems Review of Systems  Constitutional: Negative for chills and fever.  HENT: Negative for congestion.   Eyes: Negative for visual disturbance.  Respiratory: Negative for chest tightness and shortness of breath.   Cardiovascular: Negative for chest pain and palpitations.  Gastrointestinal: Positive for abdominal pain. Negative for blood in stool, nausea and vomiting.  Genitourinary: Negative for decreased urine volume, dysuria and frequency.  Musculoskeletal: Negative for back  pain.  Skin: Negative for rash.  Neurological: Negative for headaches.  Psychiatric/Behavioral: Negative for behavioral problems.     Physical Exam Updated Vital Signs BP 117/69 (BP Location: Right Arm)   Pulse 74   Temp 98.1 F (36.7 C) (Oral)   Resp 18   Ht 5' 8.25" (1.734 m)   Wt 100.9 kg (222 lb 6.4 oz)   SpO2 98%   BMI 33.57 kg/m   Physical Exam  Constitutional: He is oriented to person, place, and time. He appears well-developed and well-nourished. No distress.  HENT:  Head: Atraumatic.  Mouth/Throat: Oropharynx is clear and moist.  Eyes: Conjunctivae and EOM are normal.  Neck: Normal range of motion.  Cardiovascular: Regular rhythm, normal heart sounds and intact distal pulses.   Pulmonary/Chest: Effort normal and breath sounds normal. No respiratory distress.  Abdominal: Soft. He exhibits no distension. There is tenderness ( RLQ). There is no rebound and no guarding.  + McBurney's point, + Rovsing's sign. No CVA TTP.   Musculoskeletal: Normal range of motion.  Neurological: He is alert and oriented to person, place, and time.  Skin: Skin is warm.  Psychiatric: He has a normal mood and affect.     ED Treatments / Results  Labs (all labs ordered are listed, but only abnormal results are displayed) Labs Reviewed  COMPREHENSIVE METABOLIC PANEL - Abnormal; Notable for the following:       Result Value   Sodium 132 (*)    Chloride 100 (*)    Glucose, Bld 117 (*)    Total Bilirubin 1.3 (*)    All other components within normal limits  CBC - Abnormal; Notable for the following:    WBC 15.1 (*)    All other components within normal limits  LIPASE, BLOOD  URINALYSIS, ROUTINE W REFLEX MICROSCOPIC  CBC  SURGICAL PATHOLOGY    EKG  EKG Interpretation None       Radiology Ct Abdomen Pelvis W Contrast  Result Date: 09/16/2016 CLINICAL DATA:  RLQ pain since this morning. No nausea. No vomiting. EXAM: CT ABDOMEN AND PELVIS WITH CONTRAST TECHNIQUE:  Multidetector CT imaging of the abdomen and pelvis was performed using the standard protocol following bolus administration of intravenous contrast. CONTRAST:  ISOVUE-300 IOPAMIDOL (ISOVUE-300) INJECTION 61% COMPARISON:  None. FINDINGS: Lower chest: Coronary calcifications. No pleural or pericardial effusion. Lung bases clear. Hepatobiliary: No focal liver abnormality is seen. Status post cholecystectomy. No biliary dilatation. Pancreas: Unremarkable. No pancreatic ductal dilatation or surrounding inflammatory changes. Spleen: Normal in size without focal abnormality. Adrenals/Urinary Tract: Subcentimeter probable cysts bilaterally. No solid renal lesion. No hydronephrosis. No urolithiasis. Ureters decompressed. Urinary bladder incompletely distended. Negative adrenals. Stomach/Bowel: Stomach is nondistended. Small bowel is nondilated. Retrocecal Appendix dilated up to 9 mm diameter with some adjacent inflammatory/edematous changes in the lateral Conal fascia. No evidence of perforation or abscess. The remainder of the colon is nondilated, unremarkable. Vascular/Lymphatic: Aortoiliac calcified plaque without aneurysm or high-grade stenosis. No abdominal or  pelvic adenopathy. Reproductive: Mild prostatic enlargement with central coarse calcification. Other: No ascites.  No free air. Musculoskeletal: Supraumbilical Ventral hernias containing only mesenteric fat. Degenerative disc disease L5-S1 and L4-5. Negative for fracture or worrisome bone lesion. IMPRESSION: 1. Probable acute appendicitis, without abscess or perforation. 2. Coronary and aortoiliac arterial calcifications. 3. Ventral hernias containing only mesenteric fat. These results will be called to the ordering clinician or representative by the Radiologist Assistant, and communication documented in the PACS or zVision Dashboard. Electronically Signed   By: Corlis Leak M.D.   On: 09/16/2016 14:07    Procedures Procedures (including critical care  time)  Medications Ordered in ED Medications  metoprolol succinate (TOPROL-XL) 24 hr tablet 12.5 mg (not administered)  nitroGLYCERIN (NITROSTAT) SL tablet 0.4 mg (not administered)  atorvastatin (LIPITOR) tablet 40 mg (not administered)  multivitamin with minerals tablet 1 tablet (not administered)  enoxaparin (LOVENOX) injection 40 mg (not administered)  0.9 %  sodium chloride infusion (not administered)  cefTRIAXone (ROCEPHIN) 2 g in dextrose 5 % 50 mL IVPB (not administered)    And  metroNIDAZOLE (FLAGYL) IVPB 500 mg (not administered)  acetaminophen (TYLENOL) tablet 1,000 mg (not administered)  HYDROmorphone (DILAUDID) injection 1-2 mg (not administered)  oxyCODONE (Oxy IR/ROXICODONE) immediate release tablet 5-10 mg (not administered)  docusate sodium (COLACE) capsule 100 mg (not administered)  ondansetron (ZOFRAN-ODT) disintegrating tablet 4 mg (not administered)    Or  ondansetron (ZOFRAN) injection 4 mg (not administered)  prochlorperazine (COMPAZINE) tablet 10 mg (not administered)    Or  prochlorperazine (COMPAZINE) injection 5-10 mg (not administered)  cefTRIAXone (ROCEPHIN) 2 g in dextrose 5 % 50 mL IVPB (0 g Intravenous Stopped 09/16/16 1818)    And  metroNIDAZOLE (FLAGYL) IVPB 500 mg (500 mg Intravenous New Bag/Given 09/16/16 1816)     Initial Impression / Assessment and Plan / ED Course  I have reviewed the triage vital signs and the nursing notes.  Pertinent labs & imaging results that were available during my care of the patient were reviewed by me and considered in my medical decision making (see chart for details).     Patient is a 61 year old male past medical history significant for prior cholecystectomy, who presents with a one-day history of right lower quadrant abdominal pain. Outpatient CT scan showed acute appendicitis. On arrival in moderate distress. Afebrile, hemodynamically stable.  CT scan and outpatient showed appendicitis w/o signs of perforation  or abscess.  Given abx, antiemetics and pain medication.  Made NPO. Gen. Surgery, Dr. Danella Maiers, consulted and evaluated the patient in the ED. patient taken to the OR from the emergency department. Patient stable at time of transfer.  Final Clinical Impressions(s) / ED Diagnoses   Final diagnoses:  Acute appendicitis, unspecified acute appendicitis type    New Prescriptions Current Discharge Medication List       Corena Herter, MD 09/16/16 2243    Melene Plan, DO 09/16/16 2307

## 2016-09-16 NOTE — Op Note (Signed)
OPERATIVE REPORT  DATE OF OPERATION: 09/16/2016  PATIENT:  Chris [name]  61 y.o. male  PRE-OPERATIVE DIAGNOSIS:  Acute appendicitis  POST-OPERATIVE DIAGNOSIS:  Acute appendicitis, abdominal adhesions  INDICATION(S) FOR OPERATION:  She presented to his primary care physician's office with abdominal pain and subsequent CT scan demonstrated acute appendicitis.  FINDINGS:  An acutely inflamed but nonperforated retrocecal appendix. Also patient has significant adhesions from previous abdominal surgery requiring extensive adhesio lysis lasting approximately 45 minutes.  PROCEDURE:  Procedure(s): APPENDECTOMY LAPAROSCOPIC  SURGEON:  Surgeon(s): [name], MD  ASSISTANT: None  ANESTHESIA:   general  COMPLICATIONS:  None  EBL: 75 ml  BLOOD ADMINISTERED: none  DRAINS: none   SPECIMEN:  Source of Specimen:  Appendix  COUNTS CORRECT:  YES  PROCEDURE DETAILS: The patient was taken to the operating room and placed on the table in the supine position. After an adequate general endotracheal anesthetic was administered, he was prepped and draped in the usual sterile manner exposing his abdomen.  A proper timeout was performed identifying the patient and the procedure to be performed. The umbilical midline incision was made using a #15 blade and taken down to the midline fascia. Just below the fascia at the level of the umbilicus and umbilical hernia was noted in its area was grabbed with a Coker clamps and subsequently lifted up and used as a site for entrance. We slightly enlarged using a #15 blade and passed a pursestring suture of 0 Vicryl around the opening. This secured in place a Hassan cannula which is passed easily into the perineal cavity. Carbon dioxide gas was insufflated into the perineal cavity up to a maximal intra-abdominal pressure of 15 mmHg.  Upon passing the laparoscope with attached camera and light source were able to see a number of abdominal adhesions tethering  small bowel, right colon, and omentum to the anterior abdominal wall.  A 5 mm cannula and trocar were passed in the right lower quadrant which is uses access in order to pass a laparoscopic scissors to take down these abdominal adhesions without using electrocautery. Extreme care was taken and no injury to the small bowel was noted during this procedure.  Once the adhesions were taken down right upper quadrant 5 mm cannula and a left low quadrant 5 mm cannulas placed under direct vision. The patient was placed in Trendelenburg position on the left side was tilted down.  The appendix was found to be coming off the lateral aspect of the cecum and tethered down by adhesions and scar tissue and inflammatory able to dissected out using a Harmonic Scalpel and also a Art gallery manager. We were able to access the mesoappendix and come across it with the harmonic scalpel although it was a little bit more bloody than usual because the patient was on Plavix and aspirin.  There was no overt arterial bleeding after taking down the mesoappendix and we came across the base of the appendix using a blue cartridge Endo GIA. The detached appendix was retrieved using an Endo Catch bag.  We used 3 L of saline Martin aspirated and irrigated off the area around the appendix was there was blood noted but no active bleeding. We subsequently aspirated all fluid and gas from the perineal cavity, place the patient back in the neutral position, then tie off the super umbilical fascia site using a fascial stitch was used in place.  Once this was done we were able to inject 0.25% Marcaine at all sites. The skin at the  supraumbilical site was closed using a running subcuticular stitch of 4-0 Monocryl. Dermabond Steri-Strips and Tegaderm used to complete all dressings. All needle counts, sponge counts, and instrument counts were correct.  PATIENT DISPOSITION:  PACU - hemodynamically stable.   Ladarrious Kirksey 6/27/20188:42 PM

## 2016-09-16 NOTE — Transfer of Care (Signed)
Immediate Anesthesia Transfer of Care Note  Patient: Chris [name]  Procedure(s) Performed: Procedure(s): APPENDECTOMY LAPAROSCOPIC (N/A)  Patient Location: PACU  Anesthesia Type:General  Level of Consciousness: awake, alert , oriented and patient cooperative  Airway & Oxygen Therapy: Patient Spontanous Breathing and Patient connected to nasal cannula oxygen  Post-op Assessment: Report given to RN and Post -op Vital signs reviewed and stable  Post vital signs: Reviewed and stable  Last Vitals:  Vitals:   09/16/16 1536 09/16/16 1800  BP: (!) 144/83 (!) 150/89  Pulse: 69   Resp: 16   Temp:      Last Pain:  Vitals:   09/16/16 1658  TempSrc:   PainSc: 5          Complications: No apparent anesthesia complications

## 2016-09-16 NOTE — Progress Notes (Signed)
Chris [name]  MRN: [id number] DOB: 11/04/1955  PCP: Porfirio OarJeffery, Chelle, PA-C  Subjective:  Pt is a 61 year old male PMH CAD, OSA, ED, HLD, who presents to clinic for side pain x 2 hours. He was at work this morning when he noticed pain at lower right side. Worse if he moves "feels like someone is poking me".  Endorses nausea. He ate oatmeal for breakfast this morning. Last bowel movement was this morning. 5/10 pain. Does not radiate.  Denies vomiting, fever, chills, abnormal bowel movements or diarrhea.   Review of Systems  Constitutional: Negative for appetite change, chills, diaphoresis, fatigue and fever.  Gastrointestinal: Positive for abdominal pain and nausea. Negative for abdominal distention, blood in stool, constipation, diarrhea and vomiting.  Psychiatric/Behavioral: Negative for sleep disturbance.    Patient Active Problem List   Diagnosis Date Noted  . Edentulous 06/11/2016  . Closed fracture of left lateral malleolus 11/25/2015  . Overweight 05/21/2014  . History of MI (myocardial infarction) 05/21/2014  . Unstable angina (HCC) 10/10/2012  . ED (erectile dysfunction) 07/25/2012  . RESTLESS LEGS SYNDROME 02/20/2010  . Hyperlipidemia 02/19/2010  . OBSTRUCTIVE SLEEP APNEA 02/19/2010  . CAD (coronary artery disease) 02/19/2010    Current Outpatient Prescriptions on File Prior to Visit  Medication Sig Dispense Refill  . aspirin 81 MG tablet Take 81 mg by mouth daily.     [name]. atorvastatin (LIPITOR) 40 MG tablet Take 1 tablet (40 mg total) by mouth daily. 30 tablet 11  . clopidogrel (PLAVIX) 75 MG tablet Take 1 tablet (75 mg total) by mouth daily. 90 tablet 3  . metoprolol succinate (TOPROL-XL) 25 MG 24 hr tablet TAKE 1/2 TABLET BY MOUTH 2 TIMES DAILY 90 tablet 0  . Multiple Vitamin (MULTIVITAMIN) tablet Take 1 tablet by mouth daily.    . nitroGLYCERIN (NITROSTAT) 0.4 MG SL tablet Place 1 tablet (0.4 mg total) under the tongue every 5 (five) minutes as needed for chest  pain. 90 tablet 3  . Omega-3 Fatty Acids (FISH OIL) 1200 MG CAPS Take 2 capsules by mouth 2 (two) times daily at 10 AM and 5 PM.     . amLODipine (NORVASC) 2.5 MG tablet TAKE 1 TABLET (2.5 MG TOTAL) BY MOUTH DAILY. (Patient not taking: Reported on 09/16/2016) 90 tablet 3  . Azelastine HCl 0.15 % SOLN PLACE 2 SPRAYS INTO BOTH NOSTRILS 2 (TWO) TIMES DAILY. (Patient not taking: Reported on 09/16/2016) 30 mL 12   No current facility-administered medications on file prior to visit.     Allergies  Allergen Reactions  . Ace Inhibitors Rash    REACTION: Rash  . Ropinirole Hcl Rash     Objective:  Pulse 70   Temp 98.2 F (36.8 C) (Oral)   Ht 5' 8.25" (1.734 m)   Wt 210 lb (95.3 kg)   SpO2 99%   BMI 31.70 kg/m   Physical Exam  Constitutional: He is oriented to person, place, and time and well-developed, well-nourished, and in no distress. No distress.  Cardiovascular: Normal rate, regular rhythm and normal heart sounds.   Abdominal: Soft. Normal appearance and bowel sounds are normal. He exhibits no distension and no mass. There is tenderness ("7/10" pain with pressure) in the right lower quadrant. There is no rigidity, no rebound and no guarding.  Central obesity Negative psoas sign Negative obturator sign  Neurological: He is alert and oriented to person, place, and time. GCS score is 15.  Skin: Skin is warm and dry.  Psychiatric: Mood,  memory, affect and judgment normal.  Vitals reviewed.  Results for orders placed or performed in visit on 09/16/16  POCT CBC  Result Value Ref Range   WBC 13.7 (A) 4.6 - 10.2 K/uL   Lymph, poc 2.2 0.6 - 3.4   POC LYMPH PERCENT 16.3 10 - 50 %L   MID (cbc) 0.9 0 - 0.9   POC MID % 6.4 0 - 12 %M   POC Granulocyte 10.6 (A) 2 - 6.9   Granulocyte percent 77.3 37 - 80 %G   RBC 4.76 4.69 - 6.13 M/uL   Hemoglobin 14.2 14.1 - 18.1 g/dL   HCT, POC 16.1 (A) 09.6 - 53.7 %   MCV 85.9 80 - 97 fL   MCH, POC 29.8 27 - 31.2 pg   MCHC 34.7 31.8 - 35.4 g/dL    RDW, POC 04.5 %   Platelet Count, POC 321 142 - 424 K/uL   MPV 6.9 0 - 99.8 fL  POCT urinalysis dipstick  Result Value Ref Range   Color, UA yellow yellow   Clarity, UA clear clear   Glucose, UA negative negative mg/dL   Bilirubin, UA negative negative   Ketones, POC UA negative negative mg/dL   Spec Grav, UA >=4.098 (A) 1.010 - 1.025   Blood, UA negative negative   pH, UA 5.5 5.0 - 8.0   Protein Ur, POC negative negative mg/dL   Urobilinogen, UA 0.2 0.2 or 1.0 E.U./dL   Nitrite, UA Negative Negative   Leukocytes, UA Negative Negative    Assessment and Plan :  1. Right lower quadrant abdominal pain 2. Nausea without vomiting 3. Leukocytosis, unspecified type - POCT CBC - POCT urinalysis dipstick - POCT Microscopic Urinalysis ([facility]) - Urine Culture - CT Abdomen Pelvis W Contrast; Future - RLQ pain, nausea and leukocytosis -  Will send pt for STAT CT abd/pelvis. Discussed with patient. He understands. Will contact with results and plan.    Marco Collie, PA-C  [facility] at [facility] 09/16/2016 9:43 AM

## 2016-09-17 ENCOUNTER — Encounter (HOSPITAL_COMMUNITY): Payer: Self-pay | Admitting: General Surgery

## 2016-09-17 LAB — CBC
HEMATOCRIT: 34.4 % — AB (ref 39.0–52.0)
Hemoglobin: 11.1 g/dL — ABNORMAL LOW (ref 13.0–17.0)
MCH: 28.6 pg (ref 26.0–34.0)
MCHC: 32.3 g/dL (ref 30.0–36.0)
MCV: 88.7 fL (ref 78.0–100.0)
Platelets: 386 10*3/uL (ref 150–400)
RBC: 3.88 MIL/uL — ABNORMAL LOW (ref 4.22–5.81)
RDW: 13 % (ref 11.5–15.5)
WBC: 16.2 10*3/uL — ABNORMAL HIGH (ref 4.0–10.5)

## 2016-09-17 LAB — URINE CULTURE: Organism ID, Bacteria: NO GROWTH

## 2016-09-17 MED ORDER — SODIUM CHLORIDE 0.9 % IV BOLUS (SEPSIS)
500.0000 mL | Freq: Once | INTRAVENOUS | Status: AC
  Administered 2016-09-17: 500 mL via INTRAVENOUS

## 2016-09-17 MED ORDER — ALUM & MAG HYDROXIDE-SIMETH 200-200-20 MG/5ML PO SUSP
30.0000 mL | ORAL | Status: DC | PRN
  Administered 2016-09-17 (×2): 30 mL via ORAL
  Filled 2016-09-17 (×2): qty 30

## 2016-09-17 MED ORDER — PANTOPRAZOLE SODIUM 40 MG PO TBEC
40.0000 mg | DELAYED_RELEASE_TABLET | Freq: Every day | ORAL | Status: DC | PRN
  Administered 2016-09-17 – 2016-09-18 (×2): 40 mg via ORAL
  Filled 2016-09-17 (×2): qty 1

## 2016-09-17 NOTE — Progress Notes (Signed)
Patient ambulated with his spouse down the hall over 550 feet. Patient complains of tenderness to abdomen, no complaints of nausea.

## 2016-09-17 NOTE — Progress Notes (Addendum)
RN notified Evangeline GulaBrooke [name], PA, that patient has not voided since earlier this AM besides only of drops of urine into urinal. Patient states he does not feel the urge to void. Recent bladder scan revealed >280 mL of urine. PA gave RN verbal order to in and out cath patient once and then PRN if bladder scan shows >200 mL.  ADDENDUM 15:45 RN went into patient's room to do in and out catheter with second person and patient voided 180 mL into urinal. Post void bladder scan showed 90 mL. Evangeline GulaBrooke [name], [place], updated of this when she called for an update.

## 2016-09-17 NOTE — Discharge Instructions (Signed)

## 2016-09-17 NOTE — Progress Notes (Signed)
Patient ID: Chris [name], male   DOB: [birth year], 61 y.o.   MRN: [id number]  [facility] Surgery Progress Note  1 Day Post-Op  Subjective: CC- appendicitis Sitting up in bed, wife at bedside. Patient vomited last night, feeling a little better this morning. Tolerating a little jello. States that he feels bloated, but is passing flatus. Denies nausea. Pain well controlled.  Objective: Vital signs in last 24 hours: Temp:  [97.3 F (36.3 C)-98.2 F (36.8 C)] 98 F (36.7 C) (06/28 0559) Pulse Rate:  [69-84] 81 (06/28 0832) Resp:  [16-18] 18 (06/28 0832) BP: (99-159)/(57-97) 122/63 (06/28 0832) SpO2:  [93 %-100 %] 93 % (06/28 0559) Weight:  [210 lb (95.3 kg)-222 lb 6.4 oz (100.9 kg)] 222 lb 6.4 oz (100.9 kg) (06/27 2144) Last BM Date: 09/16/16  Intake/Output from previous day: 06/27 0701 - 06/28 0700 In: 1628.3 [P.O.:240; I.V.:1138.3; IV Piggyback:250] Out: 325 [Urine:175; Emesis/NG output:100; Blood:50] Intake/Output this shift: No intake/output data recorded.  PE: Gen:  Alert, NAD, pleasant HEENT: EOM's intact, pupils equal  Card:  RRR, no M/G/R heard Pulm:  CTAB, no W/R/R, effort normal Abd: Soft, distended, +BS, NT, incisions C/D/I with steris in place Ext:  No erythema, edema, or tenderness BUE/BLE  Psych: A&Ox3  Skin: no rashes noted, warm and dry  Lab Results:   Recent Labs  09/16/16 1500 09/17/16 0408  WBC 15.1* 16.2*  HGB 14.1 11.1*  HCT 41.6 34.4*  PLT 272 386   BMET  Recent Labs  09/16/16 1345 09/16/16 1500  NA  --  132*  K  --  4.0  CL  --  100*  CO2  --  25  GLUCOSE  --  117*  BUN  --  7  CREATININE 0.70 0.81  CALCIUM  --  9.0   PT/INR No results for input(s): LABPROT, INR in the last 72 hours. CMP     Component Value Date/Time   NA 132 (L) 09/16/2016 1500   K 4.0 09/16/2016 1500   CL 100 (L) 09/16/2016 1500   CO2 25 09/16/2016 1500   GLUCOSE 117 (H) 09/16/2016 1500   BUN 7 09/16/2016 1500   CREATININE 0.81 09/16/2016 1500    CREATININE 0.87 11/11/2015 0734   CALCIUM 9.0 09/16/2016 1500   PROT 6.9 09/16/2016 1500   ALBUMIN 4.0 09/16/2016 1500   AST 32 09/16/2016 1500   ALT 35 09/16/2016 1500   ALKPHOS 77 09/16/2016 1500   BILITOT 1.3 (H) 09/16/2016 1500   GFRNONAA >60 09/16/2016 1500   GFRNONAA >89 03/13/2015 0929   GFRAA >60 09/16/2016 1500   GFRAA >89 03/13/2015 0929   Lipase     Component Value Date/Time   LIPASE 27 09/16/2016 1500       Studies/Results: Ct Abdomen Pelvis W Contrast  Result Date: 09/16/2016 CLINICAL DATA:  RLQ pain since this morning. No nausea. No vomiting. EXAM: CT ABDOMEN AND PELVIS WITH CONTRAST TECHNIQUE: Multidetector CT imaging of the abdomen and pelvis was performed using the standard protocol following bolus administration of intravenous contrast. CONTRAST:  ISOVUE-300 IOPAMIDOL (ISOVUE-300) INJECTION 61% COMPARISON:  None. FINDINGS: Lower chest: Coronary calcifications. No pleural or pericardial effusion. Lung bases clear. Hepatobiliary: No focal liver abnormality is seen. Status post cholecystectomy. No biliary dilatation. Pancreas: Unremarkable. No pancreatic ductal dilatation or surrounding inflammatory changes. Spleen: Normal in size without focal abnormality. Adrenals/Urinary Tract: Subcentimeter probable cysts bilaterally. No solid renal lesion. No hydronephrosis. No urolithiasis. Ureters decompressed. Urinary bladder incompletely distended. Negative adrenals. Stomach/Bowel: Stomach is  nondistended. Small bowel is nondilated. Retrocecal Appendix dilated up to 9 mm diameter with some adjacent inflammatory/edematous changes in the lateral Conal fascia. No evidence of perforation or abscess. The remainder of the colon is nondilated, unremarkable. Vascular/Lymphatic: Aortoiliac calcified plaque without aneurysm or high-grade stenosis. No abdominal or pelvic adenopathy. Reproductive: Mild prostatic enlargement with central coarse calcification. Other: No ascites.  No free air.  Musculoskeletal: Supraumbilical Ventral hernias containing only mesenteric fat. Degenerative disc disease L5-S1 and L4-5. Negative for fracture or worrisome bone lesion. IMPRESSION: 1. Probable acute appendicitis, without abscess or perforation. 2. Coronary and aortoiliac arterial calcifications. 3. Ventral hernias containing only mesenteric fat. These results will be called to the ordering clinician or representative by the Radiologist Assistant, and communication documented in the PACS or zVision Dashboard. Electronically Signed   By: Corlis Leak  Hassell M.D.   On: 09/16/2016 14:07    Anti-infectives: Anti-infectives    Start     Dose/Rate Route Frequency Ordered Stop   09/16/16 2206  cefTRIAXone (ROCEPHIN) 2 g in dextrose 5 % 50 mL IVPB     2 g 100 mL/hr over 30 Minutes Intravenous Every 24 hours 09/16/16 2206 09/17/16 0041   09/16/16 2206  metroNIDAZOLE (FLAGYL) IVPB 500 mg     500 mg 100 mL/hr over 60 Minutes Intravenous Every 8 hours 09/16/16 2206 09/17/16 0605   09/16/16 1700  cefTRIAXone (ROCEPHIN) 2 g in dextrose 5 % 50 mL IVPB     2 g 100 mL/hr over 30 Minutes Intravenous  Once 09/16/16 1657 09/16/16 1818   09/16/16 1700  metroNIDAZOLE (FLAGYL) IVPB 500 mg     500 mg 100 mL/hr over 60 Minutes Intravenous  Once 09/16/16 1657 09/16/16 1916       Assessment/Plan Acute appendicitis S/p lap appy 6/27 Dr. Lindie SpruceWyatt - POD 1 - WBC 16.2, afebrile - n/v last night  CAD with h/o MI - on plavix and aspirin  ID - rocephin/flagyl preop FEN - IVF, CLD advance as tolerated VTE - SCDs, lovenox  Plan - on clear liquids, advance as tolerate. Ambulate today. Will recheck later today for possible discharge.   LOS: 0 days    Edson SnowballBROOKE A [name] , [facility] 09/17/2016, 8:52 AM Pager: [phone] Consults: [phone] Mon-Fri 7:00 am-4:30 pm Sat-Sun 7:00 am-11:30 am

## 2016-09-17 NOTE — Progress Notes (Signed)
RN notified Chris GulaBrooke [name], PA, that patient vomitted after eating 3/4 cup of jello on breakfast tray. Patient states that he did not feel nauseous until he got up to walk to the bathroom. PA gave RN orders to keep patient NPO but she did say that it was OK for patient to take oral medications.

## 2016-09-18 DIAGNOSIS — G4733 Obstructive sleep apnea (adult) (pediatric): Secondary | ICD-10-CM | POA: Diagnosis present

## 2016-09-18 DIAGNOSIS — D62 Acute posthemorrhagic anemia: Secondary | ICD-10-CM | POA: Diagnosis not present

## 2016-09-18 DIAGNOSIS — R1031 Right lower quadrant pain: Secondary | ICD-10-CM | POA: Diagnosis present

## 2016-09-18 DIAGNOSIS — Z9049 Acquired absence of other specified parts of digestive tract: Secondary | ICD-10-CM | POA: Diagnosis not present

## 2016-09-18 DIAGNOSIS — I252 Old myocardial infarction: Secondary | ICD-10-CM | POA: Diagnosis not present

## 2016-09-18 DIAGNOSIS — Z6833 Body mass index (BMI) 33.0-33.9, adult: Secondary | ICD-10-CM | POA: Diagnosis not present

## 2016-09-18 DIAGNOSIS — I2510 Atherosclerotic heart disease of native coronary artery without angina pectoris: Secondary | ICD-10-CM | POA: Diagnosis present

## 2016-09-18 DIAGNOSIS — Z87891 Personal history of nicotine dependence: Secondary | ICD-10-CM | POA: Diagnosis not present

## 2016-09-18 DIAGNOSIS — K3589 Other acute appendicitis: Secondary | ICD-10-CM | POA: Diagnosis present

## 2016-09-18 DIAGNOSIS — Z888 Allergy status to other drugs, medicaments and biological substances status: Secondary | ICD-10-CM | POA: Diagnosis not present

## 2016-09-18 DIAGNOSIS — E785 Hyperlipidemia, unspecified: Secondary | ICD-10-CM | POA: Diagnosis present

## 2016-09-18 DIAGNOSIS — Z7982 Long term (current) use of aspirin: Secondary | ICD-10-CM | POA: Diagnosis not present

## 2016-09-18 DIAGNOSIS — Z955 Presence of coronary angioplasty implant and graft: Secondary | ICD-10-CM | POA: Diagnosis not present

## 2016-09-18 DIAGNOSIS — Z7902 Long term (current) use of antithrombotics/antiplatelets: Secondary | ICD-10-CM | POA: Diagnosis not present

## 2016-09-18 DIAGNOSIS — K660 Peritoneal adhesions (postprocedural) (postinfection): Secondary | ICD-10-CM | POA: Diagnosis present

## 2016-09-18 DIAGNOSIS — E663 Overweight: Secondary | ICD-10-CM | POA: Diagnosis present

## 2016-09-18 LAB — CBC
HCT: 21.1 % — ABNORMAL LOW (ref 39.0–52.0)
Hemoglobin: 7 g/dL — ABNORMAL LOW (ref 13.0–17.0)
MCH: 28.8 pg (ref 26.0–34.0)
MCHC: 32.6 g/dL (ref 30.0–36.0)
MCV: 88.5 fL (ref 78.0–100.0)
PLATELETS: 237 10*3/uL (ref 150–400)
RBC: 2.43 MIL/uL — ABNORMAL LOW (ref 4.22–5.81)
RDW: 13.3 % (ref 11.5–15.5)
WBC: 15.7 10*3/uL — ABNORMAL HIGH (ref 4.0–10.5)

## 2016-09-18 LAB — PREPARE RBC (CROSSMATCH)

## 2016-09-18 LAB — HEMOGLOBIN AND HEMATOCRIT, BLOOD
HCT: 28 % — ABNORMAL LOW (ref 39.0–52.0)
Hemoglobin: 9.3 g/dL — ABNORMAL LOW (ref 13.0–17.0)

## 2016-09-18 LAB — BASIC METABOLIC PANEL
Anion gap: 7 (ref 5–15)
BUN: 22 mg/dL — AB (ref 6–20)
CO2: 26 mmol/L (ref 22–32)
CREATININE: 1.13 mg/dL (ref 0.61–1.24)
Calcium: 8.1 mg/dL — ABNORMAL LOW (ref 8.9–10.3)
Chloride: 104 mmol/L (ref 101–111)
GFR calc Af Amer: >60 mL/min (ref >60)
Glucose, Bld: 145 mg/dL — ABNORMAL HIGH (ref 65–99)
Potassium: 4.7 mmol/L (ref 3.5–5.1)
SODIUM: 137 mmol/L (ref 135–145)

## 2016-09-18 MED ORDER — METRONIDAZOLE IN NACL 5-0.79 MG/ML-% IV SOLN
500.0000 mg | Freq: Three times a day (TID) | INTRAVENOUS | Status: DC
  Administered 2016-09-18 – 2016-09-19 (×3): 500 mg via INTRAVENOUS
  Filled 2016-09-18 (×6): qty 100

## 2016-09-18 MED ORDER — DEXTROSE 5 % IV SOLN
2.0000 g | INTRAVENOUS | Status: DC
  Administered 2016-09-18: 2 g via INTRAVENOUS
  Filled 2016-09-18 (×2): qty 2

## 2016-09-18 MED ORDER — SODIUM CHLORIDE 0.9 % IV SOLN
Freq: Once | INTRAVENOUS | Status: AC
  Administered 2016-09-18: 07:00:00 via INTRAVENOUS

## 2016-09-18 NOTE — Progress Notes (Signed)
Patient ID: Chris RutherfordHank E [name], male   DOB: [birth year], 61 y.o.   MRN: [id number]  [facility]  2 Days Post-Op  Subjective: CC- appendicitis Up in chair this morning. States that he ambulated multiple times yesterday, but still feels bloated. No flatus or BM. Tolerating sips of clears. Denies n/v. Hg dropped to 7 today from 11.1. Getting 2 uPRBC. UOP improved.  Objective: Vital signs in last 24 hours: Temp:  [97.3 F (36.3 C)-98.5 F (36.9 C)] 98.5 F (36.9 C) (06/29 0631) Pulse Rate:  [76-91] 82 (06/29 0631) Resp:  [18] 18 (06/29 0631) BP: (109-133)/(63-78) 116/64 (06/29 0631) SpO2:  [93 %-99 %] 94 % (06/29 0631) Last BM Date: 09/16/16  Intake/Output from previous day: 06/28 0701 - 06/29 0700 In: 2496.7 [P.O.:240; I.V.:2256.7] Out: 1730 [Urine:1730] Intake/Output this shift: No intake/output data recorded.  PE: Gen:  Alert, NAD, pleasant HEENT: EOM's intact, pupils equal  Card:  RRR, no M/G/R heard Pulm:  CTAB, no W/R/R, effort normal Abd: Soft, distended, +BS, NT, incisions C/D/I with steris in place Ext:  No erythema, edema, or tenderness BUE/BLE  Psych: A&Ox3  Skin: no rashes noted, warm and dry  Lab Results:   Recent Labs  09/17/16 0408 09/18/16 0425  WBC 16.2* 15.7*  HGB 11.1* 7.0*  HCT 34.4* 21.1*  PLT 386 237   BMET  Recent Labs  09/16/16 1500 09/18/16 0425  NA 132* 137  K 4.0 4.7  CL 100* 104  CO2 25 26  GLUCOSE 117* 145*  BUN 7 22*  CREATININE 0.81 1.13  CALCIUM 9.0 8.1*   PT/INR No results for input(s): LABPROT, INR in the last 72 hours. CMP     Component Value Date/Time   NA 137 09/18/2016 0425   K 4.7 09/18/2016 0425   CL 104 09/18/2016 0425   CO2 26 09/18/2016 0425   GLUCOSE 145 (H) 09/18/2016 0425   BUN 22 (H) 09/18/2016 0425   CREATININE 1.13 09/18/2016 0425   CREATININE 0.87 11/11/2015 0734   CALCIUM 8.1 (L) 09/18/2016 0425   PROT 6.9 09/16/2016 1500   ALBUMIN 4.0 09/16/2016 1500   AST 32  09/16/2016 1500   ALT 35 09/16/2016 1500   ALKPHOS 77 09/16/2016 1500   BILITOT 1.3 (H) 09/16/2016 1500   GFRNONAA >60 09/18/2016 0425   GFRNONAA >89 03/13/2015 0929   GFRAA >60 09/18/2016 0425   GFRAA >89 03/13/2015 0929   Lipase     Component Value Date/Time   LIPASE 27 09/16/2016 1500       Studies/Results: Ct Abdomen Pelvis W Contrast  Result Date: 09/16/2016 CLINICAL DATA:  RLQ pain since this morning. No nausea. No vomiting. EXAM: CT ABDOMEN AND PELVIS WITH CONTRAST TECHNIQUE: Multidetector CT imaging of the abdomen and pelvis was performed using the standard protocol following bolus administration of intravenous contrast. CONTRAST:  100mL ISOVUE-300 IOPAMIDOL (ISOVUE-300) INJECTION 61% COMPARISON:  None. FINDINGS: Lower chest: Coronary calcifications. No pleural or pericardial effusion. Lung bases clear. Hepatobiliary: No focal liver abnormality is seen. Status post cholecystectomy. No biliary dilatation. Pancreas: Unremarkable. No pancreatic ductal dilatation or surrounding inflammatory changes. Spleen: Normal in size without focal abnormality. Adrenals/Urinary Tract: Subcentimeter probable cysts bilaterally. No solid renal lesion. No hydronephrosis. No urolithiasis. Ureters decompressed. Urinary bladder incompletely distended. Negative adrenals. Stomach/Bowel: Stomach is nondistended. Small bowel is nondilated. Retrocecal Appendix dilated up to 9 mm diameter with some adjacent inflammatory/edematous changes in the lateral Conal fascia. No evidence of perforation or abscess. The remainder of the colon is  nondilated, unremarkable. Vascular/Lymphatic: Aortoiliac calcified plaque without aneurysm or high-grade stenosis. No abdominal or pelvic adenopathy. Reproductive: Mild prostatic enlargement with central coarse calcification. Other: No ascites.  No free air. Musculoskeletal: Supraumbilical Ventral hernias containing only mesenteric fat. Degenerative disc disease L5-S1 and L4-5. Negative  for fracture or worrisome bone lesion. IMPRESSION: 1. Probable acute appendicitis, without abscess or perforation. 2. Coronary and aortoiliac arterial calcifications. 3. Ventral hernias containing only mesenteric fat. These results will be called to the ordering clinician or representative by the Radiologist Assistant, and communication documented in the PACS or zVision Dashboard. Electronically Signed   By: Corlis Leak M.D.   On: 09/16/2016 14:07    Anti-infectives: Anti-infectives    Start     Dose/Rate Route Frequency Ordered Stop   09/18/16 0830  cefTRIAXone (ROCEPHIN) 2 g in dextrose 5 % 50 mL IVPB     2 g 100 mL/hr over 30 Minutes Intravenous Every 24 hours 09/18/16 0825     09/18/16 0830  metroNIDAZOLE (FLAGYL) IVPB 500 mg     500 mg 100 mL/hr over 60 Minutes Intravenous Every 8 hours 09/18/16 0825     09/16/16 2206  cefTRIAXone (ROCEPHIN) 2 g in dextrose 5 % 50 mL IVPB     2 g 100 mL/hr over 30 Minutes Intravenous Every 24 hours 09/16/16 2206 09/17/16 0041   09/16/16 2206  metroNIDAZOLE (FLAGYL) IVPB 500 mg     500 mg 100 mL/hr over 60 Minutes Intravenous Every 8 hours 09/16/16 2206 09/17/16 0605   09/16/16 1700  cefTRIAXone (ROCEPHIN) 2 g in dextrose 5 % 50 mL IVPB     2 g 100 mL/hr over 30 Minutes Intravenous  Once 09/16/16 1657 09/16/16 1818   09/16/16 1700  metroNIDAZOLE (FLAGYL) IVPB 500 mg     500 mg 100 mL/hr over 60 Minutes Intravenous  Once 09/16/16 1657 09/16/16 1916       Assessment/Plan Acute appendicitis S/p lap appy 6/27 Dr. Lindie Spruce - POD 2 - WBC slightly down 15.7, afebrile - no BM or flatus  CAD with h/o MI - on plavix and aspirin at home  ID - rocephin/flagyl 6/27>>day#3. Will need 5 total days of antibiotics. FEN - IVF, CLD  VTE - SCDs, hold lovenox due to anemia  Plan - patient getting 2uPRBC this AM, will recheck H&H later today. Ok for CLD. Continue ambulating. Labs in AM.   LOS: 0 days    Edson Snowball , [facility]  [facility] 09/18/2016, 8:23 AM Pager: [phone] Consults: [phone] Mon-Fri 7:00 am-4:30 pm Sat-Sun 7:00 am-11:30 am

## 2016-09-19 LAB — BASIC METABOLIC PANEL
ANION GAP: 5 (ref 5–15)
BUN: 10 mg/dL (ref 6–20)
CALCIUM: 8.2 mg/dL — AB (ref 8.9–10.3)
CO2: 25 mmol/L (ref 22–32)
Chloride: 107 mmol/L (ref 101–111)
Creatinine, Ser: 0.84 mg/dL (ref 0.61–1.24)
GLUCOSE: 118 mg/dL — AB (ref 65–99)
POTASSIUM: 4 mmol/L (ref 3.5–5.1)
Sodium: 137 mmol/L (ref 135–145)

## 2016-09-19 LAB — TYPE AND SCREEN
ABO/RH(D): B POS
ANTIBODY SCREEN: NEGATIVE
UNIT DIVISION: 0
Unit division: 0

## 2016-09-19 LAB — CBC
HCT: 26.6 % — ABNORMAL LOW (ref 39.0–52.0)
Hemoglobin: 8.7 g/dL — ABNORMAL LOW (ref 13.0–17.0)
MCH: 28.6 pg (ref 26.0–34.0)
MCHC: 32.7 g/dL (ref 30.0–36.0)
MCV: 87.5 fL (ref 78.0–100.0)
PLATELETS: 220 10*3/uL (ref 150–400)
RBC: 3.04 MIL/uL — AB (ref 4.22–5.81)
RDW: 14.4 % (ref 11.5–15.5)
WBC: 10.6 10*3/uL — AB (ref 4.0–10.5)

## 2016-09-19 LAB — BPAM RBC
BLOOD PRODUCT EXPIRATION DATE: 201807152359
BLOOD PRODUCT EXPIRATION DATE: 201807152359
ISSUE DATE / TIME: 201806291040
ISSUE DATE / TIME: 201806291455
UNIT TYPE AND RH: 7300
Unit Type and Rh: 7300

## 2016-09-19 LAB — ABO/RH: ABO/RH(D): B POS

## 2016-09-19 MED ORDER — OXYCODONE HCL 5 MG PO TABS: 5.0000 mg | ORAL_TABLET | ORAL | 0 refills | Status: DC | PRN

## 2016-09-19 NOTE — Discharge Summary (Signed)
Patient ID: Chris RutherfordHank E [name] [id number] 61 y.o. 12-20-1955  Admit date: 09/16/2016  Discharge date and time: 09/19/2016  Admitting Physician: Jimmye NormanWyatt,James  Discharge Physician: Ernestene MentionINGRAM,Jenene Kauffmann M  Admission Diagnoses: Acute appendicitis, unspecified acute appendicitis type [K35.80] Acute appendicitis [K35.80]  Discharge Diagnoses: Acute appendicitis                                         Acute blood loss anemia requiring transfusion                                         History myocardial infarction                                            History coronary stent placement to RCA, 2009                                          Anticoagulation with Plavix and aspirin                                          History of sleep apnea    Operations: Procedure(s): APPENDECTOMY LAPAROSCOPIC  Admission Condition: fair  Discharged Condition: good  Indication for Admission: This is a 61 year old man with a history of myocardial infarction and coronary stent placement on Plavix and aspirin.  He presented to his primary care physician's office with signs symptoms and physical exam consistent with acute appendicitis.  CT scan showed acute appendicitis.  He was admitted for evaluation and management by Dr. Jimmye NormanJames Wyatt.  Hospital Course: On the day of admission the patient was taken to the operating room by Dr. Lindie SpruceWyatt.  He underwent laparoscopic appendectomy.  He had a nonperforated retrocecal appendix.  He had significant adhesions from previous abdominal surgery requiring extensive adhesive lysis lasting 45 minutes.     On postoperative day 1 the patient was stable but had some vomiting.  Plavix and aspirin were held.     On postoperative day 2 he was ambulating but felt bloated.  Hemoglobin dropped to 7 from preop of 11.1.  He was transfused 2 units of PRBC.      On postoperative day 3 he was ambulating, tolerating diet, and begging to go home.  Had minimal pain.  Following transfusion  hemoglobin was 9.3.  WBC 10,600.      Examination the day of discharge revealed he was alert and talkative.  Lungs were clear.  Abdomen soft with minimal incisional tenderness.  Active bowel sounds     He was discharged home with a prescription for oxycodone for pain.  He was told to resume his aspirin and Plavix on Sunday, July 1 and otherwise resume all of his normal medications     Diet, activities, return to work issues were discussed     He was asked to call our office and make an appointment see Dr. Lindie SpruceWyatt in 2-3 weeks        Consults: None  Significant Diagnostic Studies: CT scan.  Surgical pathology.  Treatments: Surgery, laparoscopic appendectomy.  Blood transfusion  Disposition: Home  Patient Instructions:  Allergies as of 09/19/2016      Reactions   Ace Inhibitors Rash   REACTION: Rash   Ropinirole Hcl Rash      Medication List    TAKE these medications   aspirin 81 MG tablet Take 81 mg by mouth daily.   atorvastatin 40 MG tablet Commonly known as:  LIPITOR Take 1 tablet (40 mg total) by mouth daily.   clopidogrel 75 MG tablet Commonly known as:  PLAVIX Take 1 tablet (75 mg total) by mouth daily.   Fish Oil 1200 MG Caps Take 2 capsules by mouth 2 (two) times daily at 10 AM and 5 PM.   metoprolol succinate 25 MG 24 hr tablet Commonly known as:  TOPROL-XL TAKE 1/2 TABLET BY MOUTH 2 TIMES DAILY   multivitamin tablet Take 1 tablet by mouth daily.   nitroGLYCERIN 0.4 MG SL tablet Commonly known as:  NITROSTAT Place 1 tablet (0.4 mg total) under the tongue every 5 (five) minutes as needed for chest pain.   oxyCODONE 5 MG immediate release tablet Commonly known as:  Oxy IR/ROXICODONE Take 1-2 tablets (5-10 mg total) by mouth every 4 (four) hours as needed for moderate pain.   pantoprazole 40 MG tablet Commonly known as:  PROTONIX Take 40 mg by mouth daily as needed.       Activity: no heavy lifting for 3 weeks Diet: low fat, low cholesterol diet Wound  Care: as directed  Follow-up:  With Dr. Lindie Spruce in 3 weeks.  Signed: Angelia Mould. Derrell Lolling, M.D., FACS General and minimally invasive surgery Breast and Colorectal Surgery   Addendum: I logged on to the [facility] website and reviewed his prescription medication history    09/19/2016, 9:16 AM

## 2016-09-19 NOTE — Care Management Note (Signed)
Case Management Note  Patient Details  Name: Chris [name] MRN: [id number] Date of Birth: 12/16/1955  Subjective/Objective:               Patient admitted with appendectomy. Will DC to home self care. No CM needs identified.      Action/Plan:   Expected Discharge Date:  09/19/16               Expected Discharge Plan:  Home/Self Care  In-House Referral:     Discharge planning Services  CM Consult  Post Acute Care Choice:    Choice offered to:     DME Arranged:    DME Agency:     HH Arranged:    HH Agency:     Status of Service:  Completed, signed off  If discussed at [employer] Length of Stay Meetings, dates discussed:    Additional Comments:  Lawerance SabalDebbie Keyshon Stein, RN 09/19/2016, 9:44 AM

## 2016-09-19 NOTE — Progress Notes (Signed)
Patient discharged home with wife, prescriptions given.

## 2016-09-26 ENCOUNTER — Other Ambulatory Visit: Payer: Self-pay | Admitting: Physician Assistant

## 2016-09-26 DIAGNOSIS — R12 Heartburn: Secondary | ICD-10-CM

## 2016-10-30 ENCOUNTER — Ambulatory Visit: Payer: Self-pay | Admitting: General Surgery

## 2016-10-30 DIAGNOSIS — K432 Incisional hernia without obstruction or gangrene: Secondary | ICD-10-CM | POA: Diagnosis not present

## 2016-11-03 ENCOUNTER — Encounter: Payer: Self-pay | Admitting: Cardiovascular Disease

## 2016-11-03 ENCOUNTER — Ambulatory Visit (INDEPENDENT_AMBULATORY_CARE_PROVIDER_SITE_OTHER): Payer: BLUE CROSS/BLUE SHIELD | Admitting: Cardiovascular Disease

## 2016-11-03 VITALS — BP 136/82 | HR 66 | Ht 68.25 in | Wt 214.2 lb

## 2016-11-03 DIAGNOSIS — I2510 Atherosclerotic heart disease of native coronary artery without angina pectoris: Secondary | ICD-10-CM | POA: Diagnosis not present

## 2016-11-03 DIAGNOSIS — I10 Essential (primary) hypertension: Secondary | ICD-10-CM

## 2016-11-03 DIAGNOSIS — E782 Mixed hyperlipidemia: Secondary | ICD-10-CM | POA: Diagnosis not present

## 2016-11-03 LAB — BASIC METABOLIC PANEL
BUN/Creatinine Ratio: 10 (ref 10–24)
BUN: 8 mg/dL (ref 8–27)
CALCIUM: 9.8 mg/dL (ref 8.6–10.2)
CHLORIDE: 102 mmol/L (ref 96–106)
CO2: 24 mmol/L (ref 20–29)
Creatinine, Ser: 0.81 mg/dL (ref 0.76–1.27)
GFR calc Af Amer: 111 mL/min/{1.73_m2} (ref >59)
GFR calc non Af Amer: 96 mL/min/{1.73_m2} (ref >59)
GLUCOSE: 103 mg/dL — AB (ref 65–99)
Potassium: 5.2 mmol/L (ref 3.5–5.2)
Sodium: 139 mmol/L (ref 134–144)

## 2016-11-03 LAB — LIPID PANEL
CHOL/HDL RATIO: 3.8 ratio (ref 0.0–5.0)
Cholesterol, Total: 141 mg/dL (ref 100–199)
HDL: 37 mg/dL — AB (ref >39)
LDL CALC: 60 mg/dL (ref 0–99)
TRIGLYCERIDES: 220 mg/dL — AB (ref 0–149)
VLDL Cholesterol Cal: 44 mg/dL — ABNORMAL HIGH (ref 5–40)

## 2016-11-03 LAB — HEPATIC FUNCTION PANEL
ALT: 30 IU/L (ref 0–44)
AST: 22 IU/L (ref 0–40)
Albumin: 4.2 g/dL (ref 3.6–4.8)
Alkaline Phosphatase: 91 IU/L (ref 39–117)
Bilirubin Total: 0.4 mg/dL (ref 0.0–1.2)
Bilirubin, Direct: 0.14 mg/dL (ref 0.00–0.40)
Total Protein: 6.7 g/dL (ref 6.0–8.5)

## 2016-11-03 NOTE — Patient Instructions (Signed)
Medication Instructions:  Your physician recommends that you continue on your current medications as directed. Please refer to the Current Medication list given to you today.   Labwork: TODAY - cholesterol, liver panel, basic metabolic panel   Testing/Procedures: None Ordered   Follow-Up: Your physician wants you to follow-up in: 6 months with Dr. Nahser. You will receive a reminder letter in the mail two months in advance. If you don't receive a letter, please call our office to schedule the follow-up appointment.   If you need a refill on your cardiac medications before your next appointment, please call your pharmacy.   Thank you for choosing [facility]! Yong Wahlquist, RN [phone]    

## 2016-11-03 NOTE — Progress Notes (Signed)
Chris RutherfordHank E [name]  Date of Birth  02-18-1956   Problems: 1. Coronary artery disease- He presented in 2009 with some episodes of chest pain and was found to have an inferior wall myocardial infarction. He underwent successful PTCA and stenting of his mid  right coronary artery using a 2.75 x 28 mm Promus stent. The stent was post dilated using a 3.0 noncompliant balloon.  We then positioned a 3.0 x 15 mm Promus stent in the proximal right coronary artery.  The stent was post dilated using a 3.25 mm noncompliant balloon. 2. Hyperlipidemia 3. Sleep apnea 4. Hypertension     Chris [name] is a 61 year old gentleman. He has a history of coronary artery disease. He presented in 2009 with some episodes of chest pain and was found to have an inferior wall myocardial infarction. He underwent successful PTCA and stenting of his mid  right coronary artery using a 2.75 x 28 mm Promus stent. The stent was post dilated using a 3.0 noncompliant balloon.  We then positioned a 3.0 x 15 mm Promus stent in the proximal right coronary artery.  The stent was post dilated using a 3.25 mm noncompliant balloon.  Pt is doing well.  No angina or dyspnea.    He has not been sleeping well.  He also did not take his meds this am.  April 13, 2012: Chris [name] is doing well from a cardiac standpoint.  Exercising on the treadmill every other day. Complains of constipation.  October 17, 2012:  Thank started having some episodes of chest pain last week.  He had been doing lots of physical activity - played 36 holes of golf a day  for 2 days straight.  He also has been busy doing phyisical work.     He had a stress Myoview study. He walked for 11 minutes on a standard Bruce protocol triple test. A Myoview study revealed an old inferior wall myocardial infarction but was otherwise unremarkable.   June 14, 2013:  He is doing well .[name]. Has some back pain and "crick" in his neck.  No CP .  Staying active, walking in the evenings 2.5 miles    Sept. 29, 2015:  Chris [name] is doing ok.  He is not working at [employer] anymore.   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  Doing well from a cardiac standpoint.  Still walking quite a bit.    Aug. 29, 2016:  Doing well. Owns his own business.  Chris [name]  No CP , no dyspnea.   Jan. 17, 2017: No angina  Is staying busy Working hard.   Oct. 9, 2017:   Chris [name] is seen today for follow up of his CAD Is healing up from a left ankle injury.  Walking on crutches .   Wears him out.  Has not had any chest pain  Or dyspnea  Aug. 14, 2018: Chris [name] is seen for follow up for his CAD Had emergent Appy surgery. Was found to have a ventral hernia at that time. Now needs to have hernia repair.  He had no complications with his emergent appendectomy. He's been doing well. No episodes of chest pain or short of breath.    Current Outpatient Prescriptions on File Prior to Visit  Medication Sig Dispense Refill  . aspirin 81 MG tablet Take 81 mg by mouth daily.     [name]. atorvastatin (LIPITOR) 40 MG tablet Take 1 tablet (40 mg total) by mouth daily. 30 tablet 11  . clopidogrel (  PLAVIX) 75 MG tablet Take 1 tablet (75 mg total) by mouth daily. 90 tablet 3  . metoprolol succinate (TOPROL-XL) 25 MG 24 hr tablet TAKE 1/2 TABLET BY MOUTH 2 TIMES DAILY 90 tablet 0  . Multiple Vitamin (MULTIVITAMIN) tablet Take 1 tablet by mouth daily.    . nitroGLYCERIN (NITROSTAT) 0.4 MG SL tablet Place 1 tablet (0.4 mg total) under the tongue every 5 (five) minutes as needed for chest pain. 90 tablet 3  . Omega-3 Fatty Acids (FISH OIL) 1200 MG CAPS Take 2 capsules by mouth 2 (two) times daily at 10 AM and 5 PM.     . pantoprazole (PROTONIX) 40 MG tablet TAKE 1 TABLET BY MOUTH EVERY DAY AS NEEDED 90 tablet 0   No current facility-administered medications on file prior to visit.     Allergies  Allergen Reactions  . Ace Inhibitors Rash    REACTION: Rash  . Ropinirole Hcl Rash    Past  Medical History:  Diagnosis Date  . Acute MI, inferior wall, initial episode of care [facility]) 2009  . Coronary atherosclerosis of unspecified type of vessel, native or graft   . GERD (gastroesophageal reflux disease)   . High cholesterol   . Hypertension   . Seasonal allergies   . Sleep apnea    "I've never worn a mask" (09/17/2016)    Past Surgical History:  Procedure Laterality Date  . APPENDECTOMY    . CHOLECYSTECTOMY OPEN  ~ 2008   "it exploded"  . CORONARY ANGIOPLASTY WITH STENT PLACEMENT  2009    2.75 x 28 mm Promus stent to the RCA  . FRACTURE SURGERY    . LAPAROSCOPIC APPENDECTOMY N/A 09/16/2016   Procedure: APPENDECTOMY LAPAROSCOPIC;  Surgeon: [name], MD;  Location: [facility] OR;  Service: General;  Laterality: N/A;  . ORIF ANKLE FRACTURE Left 11/27/2015   Procedure: OPEN REDUCTION INTERNAL FIXATION (ORIF) ANKLE FRACTURE;  Surgeon: [name], MD;  Location: [facility];  Service: Orthopedics;  Laterality: Left;    History  Smoking Status  . Former Smoker  . Packs/day: 2.50  . Years: 36.00  . Types: Cigarettes  . Quit date: 08/22/2007  Smokeless Tobacco  . Never Used    History  Alcohol Use  . 3.6 oz/week  . 6 Cans of beer per week    Family History  Problem Relation Age of Onset  . Heart disease Mother   . Hypertension Father   . Prostate cancer Brother   . CAD Brother     Reviw of Systems:  Reviewed in the HPI.  All other systems are negative.  Physical Exam: BP 136/82   Pulse 66   Ht 5' 8.25" (1.734 m)   Wt 214 lb 3.2 oz (97.2 kg)   SpO2 99%   BMI 32.33 kg/m  The patient is alert and oriented x 3.  The mood and affect are normal.   Skin: warm and dry.  Color is normal.    HEENT:   the sclera are nonicteric.  The mucous membranes are moist.  The carotids are 2+ without bruits.  There is no thyromegaly.  There is no JVD.    Lungs: clear.  The chest wall is non tender.    Heart: regular rate with a normal S1 and S2.  There are no  murmurs, gallops, or rubs. The PMI is not displaced.     Abdomen: good bowel sounds.  There is no guarding or rebound.  There is no hepatosplenomegaly or tenderness.  There are no masses.   Extremities:  no clubbing, cyanosis, or edema.  The legs are without rashes.  The distal pulses are intact.   Neuro:  Cranial nerves II - XII are intact.  Motor and sensory functions are intact.    The gait is normal.  EKG: Oct. 9, 2017 NSR at 66,  Inc. RBBB possible Inf. MI   Assessment / Plan:   .1. Coronary artery disease-   he's doing well. Is not having any episodes of angina. Will see him in 6 months   He needs to have ventral hernia surgery. I think that he is at low risk for his ventral hernia surgery. He will need to stop Plavix for 5 days. I would prefer that he continue aspirin 81 mg a day.  2. Hyperlipidemia-    Labs have been stable. Recheck today   3. Sleep apnea  4. Hypertension -  His blood pressure is normal.     Kristeen Miss, MD  11/03/2016 9:18 AM    [facility] [address] [place],  [address] [place], [place]  [address] Pager [phone] Phone: [phone]; Fax: [phone]

## 2016-11-27 ENCOUNTER — Encounter (HOSPITAL_COMMUNITY)
Admission: RE | Admit: 2016-11-27 | Discharge: 2016-11-27 | Disposition: A | Discharge status: Home / Self Care | Payer: 59 | Source: Ambulatory Visit | Attending: General Surgery | Admitting: General Surgery

## 2016-11-27 ENCOUNTER — Encounter (HOSPITAL_COMMUNITY): Payer: Self-pay

## 2016-11-27 DIAGNOSIS — Z79899 Other long term (current) drug therapy: Secondary | ICD-10-CM | POA: Diagnosis not present

## 2016-11-27 DIAGNOSIS — Z888 Allergy status to other drugs, medicaments and biological substances status: Secondary | ICD-10-CM | POA: Diagnosis not present

## 2016-11-27 DIAGNOSIS — K432 Incisional hernia without obstruction or gangrene: Secondary | ICD-10-CM | POA: Insufficient documentation

## 2016-11-27 DIAGNOSIS — Z7982 Long term (current) use of aspirin: Secondary | ICD-10-CM | POA: Diagnosis not present

## 2016-11-27 DIAGNOSIS — Z01812 Encounter for preprocedural laboratory examination: Secondary | ICD-10-CM | POA: Insufficient documentation

## 2016-11-27 HISTORY — DX: Unspecified osteoarthritis, unspecified site: M19.90

## 2016-11-27 LAB — BASIC METABOLIC PANEL
ANION GAP: 8 (ref 5–15)
BUN: 6 mg/dL (ref 6–20)
CALCIUM: 9.2 mg/dL (ref 8.9–10.3)
CO2: 26 mmol/L (ref 22–32)
Chloride: 104 mmol/L (ref 101–111)
Creatinine, Ser: 0.88 mg/dL (ref 0.61–1.24)
GFR calc Af Amer: >60 mL/min (ref >60)
GLUCOSE: 97 mg/dL (ref 65–99)
POTASSIUM: 4 mmol/L (ref 3.5–5.1)
SODIUM: 138 mmol/L (ref 135–145)

## 2016-11-27 LAB — CBC WITH DIFFERENTIAL/PLATELET
BASOS ABS: 0 10*3/uL (ref 0.0–0.1)
BASOS PCT: 1 %
EOS PCT: 7 %
Eosinophils Absolute: 0.5 10*3/uL (ref 0.0–0.7)
HCT: 40.7 % (ref 39.0–52.0)
Hemoglobin: 13.2 g/dL (ref 13.0–17.0)
LYMPHS PCT: 38 %
Lymphs Abs: 2.8 10*3/uL (ref 0.7–4.0)
MCH: 28 pg (ref 26.0–34.0)
MCHC: 32.4 g/dL (ref 30.0–36.0)
MCV: 86.4 fL (ref 78.0–100.0)
MONO ABS: 0.6 10*3/uL (ref 0.1–1.0)
Monocytes Relative: 8 %
Neutro Abs: 3.5 10*3/uL (ref 1.7–7.7)
Neutrophils Relative %: 46 %
PLATELETS: 299 10*3/uL (ref 150–400)
RBC: 4.71 MIL/uL (ref 4.22–5.81)
RDW: 14 % (ref 11.5–15.5)
WBC: 7.5 10*3/uL (ref 4.0–10.5)

## 2016-11-27 NOTE — Pre-Procedure Instructions (Signed)
Chris [name]  11/27/2016      [id number] - [name], [place] - [address] [name] [address] Phone: [phone] Fax: [phone]  [id number] - [name], [place] - [address] [name] [address] Phone: [phone] Fax: [phone]  [name] [name] - [address] [name] [address] Phone: [phone] Fax: [phone]  [id number] [place] - [address] AT [name] [name] [name] [address] Phone: [phone] Fax: [phone]    Your procedure is scheduled on 11-30-2016 Monday    Report to [facility] [name] Admitting at 7:30A.M.   Call this number if you have problems the morning of surgery:  [phone]   Remember:  Do not eat food or drink liquids after midnight.   Take these medicines the morning of surgery with A SIP OF WATER Metoprolol(Toprol XL).Protonix  DRINK 8OZ BOX OF BOOST BREEZE BY 5:30 AM THE MORNING OF SURGERY.      Do not wear jewelry  Do not wear lotions, powders, or perfumes, or deoderant.   Do not shave 48 hours prior to surgery.  Men may shave face and neck.   Do not bring valuables to the [name].   [name] is not responsible for any belongings or valuables.  Contacts, dentures or bridgework may not be worn into surgery.  Leave your suitcase in the car.  After surgery it may be brought to your room.  For patients admitted to the [name], discharge time will be determined by your treatment team.  Patients discharged the day of surgery will not be allowed to drive home.    Special Instructions: [facility] - Preparing for Surgery  Before surgery, you can play an important role.  Because skin is not sterile, your skin needs to be as free of germs as possible.  You can reduce the number of germs on you skin by washing with CHG (chlorahexidine gluconate) soap  before surgery.  CHG is an antiseptic cleaner which kills germs and bonds with the skin to continue killing germs even after washing.  Please DO NOT use if you have an allergy to CHG or antibacterial soaps.  If your skin becomes reddened/irritated stop using the CHG and inform your nurse when you arrive at Short Stay.  Do not shave (including legs and underarms) for at least 48 hours prior to the first CHG shower.  You may shave your face.  Please follow these instructions carefully:   1.  Shower with CHG Soap the night before surgery and the   morning of Surgery.  2.  If you choose to wash your hair, wash your hair first as usual with your normal shampoo.  3.  After you shampoo, rinse your hair and body thoroughly to remove the  Shampoo.  4.  Use CHG as you would any other liquid soap.  You can apply chg directly  to the skin and wash gently with scrungie or a clean washcloth.  5.  Apply the CHG Soap to your body ONLY FROM THE NECK DOWN.   Do not use on open wounds or open sores.  Avoid contact with your eyes,  ears, mouth and genitals (private parts).  Wash genitals (private parts) with your normal soap.  6.  Wash thoroughly, paying special attention to the area where your surgery will be performed.  7.  Thoroughly  rinse your body with warm water from the neck down.  8.  DO NOT shower/wash with your normal soap after using and rinsing o  the CHG Soap.  9.  Pat yourself dry with a clean towel.            10.  Wear clean pajamas.            11.  Place clean sheets on your bed the night of your first shower and do not sleep with pets.  Day of Surgery  Do not apply any lotions/deodorants the morning of surgery.  Please wear clean clothes to the [name]/surgery center.   Please read over the following fact sheets that you were given. Coughing and Deep Breathing and Surgical Site Infection Prevention

## 2016-11-30 ENCOUNTER — Encounter (HOSPITAL_COMMUNITY): Payer: Self-pay

## 2016-11-30 ENCOUNTER — Encounter (HOSPITAL_COMMUNITY)
Admission: RE | Disposition: A | Discharge status: Home / Self Care | Payer: Self-pay | Source: Ambulatory Visit | Attending: General Surgery

## 2016-11-30 ENCOUNTER — Ambulatory Visit (HOSPITAL_COMMUNITY): Payer: 59 | Admitting: Anesthesiology

## 2016-11-30 ENCOUNTER — Ambulatory Visit (HOSPITAL_COMMUNITY)
Admission: RE | Admit: 2016-11-30 | Discharge: 2016-12-02 | Disposition: A | Discharge status: Home / Self Care | Payer: 59 | Source: Ambulatory Visit | Attending: General Surgery | Admitting: General Surgery

## 2016-11-30 DIAGNOSIS — Z7982 Long term (current) use of aspirin: Secondary | ICD-10-CM | POA: Insufficient documentation

## 2016-11-30 DIAGNOSIS — K432 Incisional hernia without obstruction or gangrene: Secondary | ICD-10-CM | POA: Insufficient documentation

## 2016-11-30 DIAGNOSIS — G4730 Sleep apnea, unspecified: Secondary | ICD-10-CM | POA: Diagnosis not present

## 2016-11-30 DIAGNOSIS — K439 Ventral hernia without obstruction or gangrene: Secondary | ICD-10-CM | POA: Diagnosis present

## 2016-11-30 DIAGNOSIS — I2510 Atherosclerotic heart disease of native coronary artery without angina pectoris: Secondary | ICD-10-CM | POA: Insufficient documentation

## 2016-11-30 DIAGNOSIS — I10 Essential (primary) hypertension: Secondary | ICD-10-CM | POA: Insufficient documentation

## 2016-11-30 DIAGNOSIS — I25110 Atherosclerotic heart disease of native coronary artery with unstable angina pectoris: Secondary | ICD-10-CM | POA: Diagnosis not present

## 2016-11-30 DIAGNOSIS — Z955 Presence of coronary angioplasty implant and graft: Secondary | ICD-10-CM | POA: Insufficient documentation

## 2016-11-30 DIAGNOSIS — I252 Old myocardial infarction: Secondary | ICD-10-CM | POA: Insufficient documentation

## 2016-11-30 DIAGNOSIS — Z87891 Personal history of nicotine dependence: Secondary | ICD-10-CM | POA: Insufficient documentation

## 2016-11-30 DIAGNOSIS — G4733 Obstructive sleep apnea (adult) (pediatric): Secondary | ICD-10-CM | POA: Diagnosis not present

## 2016-11-30 HISTORY — PX: INSERTION OF MESH: SHX5868

## 2016-11-30 HISTORY — PX: LAPAROSCOPIC INCISIONAL / UMBILICAL / VENTRAL HERNIA REPAIR: SUR789

## 2016-11-30 HISTORY — PX: VENTRAL HERNIA REPAIR: SHX424

## 2016-11-30 SURGERY — REPAIR, HERNIA, VENTRAL, LAPAROSCOPIC
Anesthesia: General | Site: Abdomen

## 2016-11-30 MED ORDER — HYDROMORPHONE HCL 1 MG/ML IJ SOLN: 0.5000 mg | INTRAMUSCULAR | Status: DC | PRN

## 2016-11-30 MED ORDER — LACTATED RINGERS IV SOLN
INTRAVENOUS | Status: DC
  Administered 2016-11-30 (×2): via INTRAVENOUS

## 2016-11-30 MED ORDER — ADULT MULTIVITAMIN W/MINERALS CH
ORAL_TABLET | Freq: Every day | ORAL | Status: DC
  Administered 2016-12-01: 1 via ORAL
  Administered 2016-12-02: 10:00:00 via ORAL
  Filled 2016-11-30 (×2): qty 1

## 2016-11-30 MED ORDER — SUGAMMADEX SODIUM 200 MG/2ML IV SOLN
INTRAVENOUS | Status: DC | PRN
  Administered 2016-11-30: 200 mg via INTRAVENOUS

## 2016-11-30 MED ORDER — ACETAMINOPHEN 500 MG PO TABS
1000.0000 mg | ORAL_TABLET | ORAL | Status: AC
  Administered 2016-11-30: 1000 mg via ORAL
  Filled 2016-11-30: qty 2

## 2016-11-30 MED ORDER — ASPIRIN 81 MG PO CHEW
81.0000 mg | CHEWABLE_TABLET | Freq: Every day | ORAL | Status: DC
  Administered 2016-12-01 – 2016-12-02 (×2): 81 mg via ORAL
  Filled 2016-11-30 (×2): qty 1

## 2016-11-30 MED ORDER — AZELASTINE HCL 0.1 % NA SOLN
1.0000 | Freq: Two times a day (BID) | NASAL | Status: DC | PRN
  Filled 2016-11-30: qty 30

## 2016-11-30 MED ORDER — NITROGLYCERIN 0.4 MG SL SUBL: 0.4000 mg | SUBLINGUAL_TABLET | SUBLINGUAL | Status: DC | PRN

## 2016-11-30 MED ORDER — ONDANSETRON HCL 4 MG/2ML IJ SOLN
INTRAMUSCULAR | Status: DC | PRN
  Administered 2016-11-30: 4 mg via INTRAVENOUS

## 2016-11-30 MED ORDER — NEOSTIGMINE METHYLSULFATE 5 MG/5ML IV SOSY
PREFILLED_SYRINGE | INTRAVENOUS | Status: AC
  Filled 2016-11-30: qty 5

## 2016-11-30 MED ORDER — CEFAZOLIN SODIUM-DEXTROSE 2-4 GM/100ML-% IV SOLN
2.0000 g | INTRAVENOUS | Status: DC
  Filled 2016-11-30: qty 100

## 2016-11-30 MED ORDER — LACTATED RINGERS IV SOLN: INTRAVENOUS | Status: DC

## 2016-11-30 MED ORDER — ROCURONIUM BROMIDE 100 MG/10ML IV SOLN
INTRAVENOUS | Status: DC | PRN
  Administered 2016-11-30: 10 mg via INTRAVENOUS
  Administered 2016-11-30: 50 mg via INTRAVENOUS
  Administered 2016-11-30: 10 mg via INTRAVENOUS

## 2016-11-30 MED ORDER — PROPOFOL 10 MG/ML IV BOLUS
INTRAVENOUS | Status: DC | PRN
  Administered 2016-11-30: 200 mg via INTRAVENOUS

## 2016-11-30 MED ORDER — CHLORHEXIDINE GLUCONATE CLOTH 2 % EX PADS: 6.0000 | MEDICATED_PAD | Freq: Once | CUTANEOUS | Status: DC

## 2016-11-30 MED ORDER — CEFAZOLIN SODIUM-DEXTROSE 2-4 GM/100ML-% IV SOLN
2.0000 g | Freq: Three times a day (TID) | INTRAVENOUS | Status: AC
  Administered 2016-11-30: 2 g via INTRAVENOUS
  Filled 2016-11-30: qty 100

## 2016-11-30 MED ORDER — ONDANSETRON 4 MG PO TBDP: 4.0000 mg | ORAL_TABLET | Freq: Four times a day (QID) | ORAL | Status: DC | PRN

## 2016-11-30 MED ORDER — PANTOPRAZOLE SODIUM 40 MG PO TBEC: 40.0000 mg | DELAYED_RELEASE_TABLET | Freq: Every day | ORAL | Status: DC | PRN

## 2016-11-30 MED ORDER — MIDAZOLAM HCL 5 MG/5ML IJ SOLN
INTRAMUSCULAR | Status: DC | PRN
  Administered 2016-11-30: 2 mg via INTRAVENOUS

## 2016-11-30 MED ORDER — FENTANYL CITRATE (PF) 250 MCG/5ML IJ SOLN
INTRAMUSCULAR | Status: AC
  Filled 2016-11-30: qty 5

## 2016-11-30 MED ORDER — TRAMADOL HCL 50 MG PO TABS
50.0000 mg | ORAL_TABLET | Freq: Four times a day (QID) | ORAL | Status: DC | PRN
  Administered 2016-12-01: 50 mg via ORAL
  Filled 2016-11-30: qty 1

## 2016-11-30 MED ORDER — DEXAMETHASONE SODIUM PHOSPHATE 10 MG/ML IJ SOLN
INTRAMUSCULAR | Status: DC | PRN
  Administered 2016-11-30: 10 mg via INTRAVENOUS

## 2016-11-30 MED ORDER — POLYMYXIN B SULFATE 500000 UNITS IJ SOLR
INTRAMUSCULAR | Status: DC | PRN
  Administered 2016-11-30: 500 mL

## 2016-11-30 MED ORDER — FENTANYL CITRATE (PF) 100 MCG/2ML IJ SOLN
25.0000 ug | INTRAMUSCULAR | Status: DC | PRN
Start: 2016-11-30 — End: 2016-11-30
  Administered 2016-11-30: 25 ug via INTRAVENOUS

## 2016-11-30 MED ORDER — ONDANSETRON HCL 4 MG/2ML IJ SOLN: 4.0000 mg | Freq: Four times a day (QID) | INTRAMUSCULAR | Status: DC | PRN

## 2016-11-30 MED ORDER — MIDAZOLAM HCL 2 MG/2ML IJ SOLN
INTRAMUSCULAR | Status: AC
Start: 2016-11-30 — End: ?
  Filled 2016-11-30: qty 2

## 2016-11-30 MED ORDER — ATORVASTATIN CALCIUM 40 MG PO TABS
40.0000 mg | ORAL_TABLET | Freq: Every day | ORAL | Status: DC
  Administered 2016-11-30 – 2016-12-01 (×2): 40 mg via ORAL
  Filled 2016-11-30 (×2): qty 1

## 2016-11-30 MED ORDER — GUAIFENESIN ER 600 MG PO TB12: 600.0000 mg | ORAL_TABLET | Freq: Two times a day (BID) | ORAL | Status: DC | PRN

## 2016-11-30 MED ORDER — 0.9 % SODIUM CHLORIDE (POUR BTL) OPTIME
TOPICAL | Status: DC | PRN
  Administered 2016-11-30: 1000 mL

## 2016-11-30 MED ORDER — ROCURONIUM BROMIDE 10 MG/ML (PF) SYRINGE
PREFILLED_SYRINGE | INTRAVENOUS | Status: AC
  Filled 2016-11-30: qty 5

## 2016-11-30 MED ORDER — METOCLOPRAMIDE HCL 5 MG/ML IJ SOLN: 10.0000 mg | Freq: Once | INTRAMUSCULAR | Status: DC | PRN

## 2016-11-30 MED ORDER — METOPROLOL SUCCINATE ER 25 MG PO TB24
12.5000 mg | ORAL_TABLET | Freq: Two times a day (BID) | ORAL | Status: DC
  Administered 2016-12-01 – 2016-12-02 (×3): 12.5 mg via ORAL
  Filled 2016-11-30 (×4): qty 1

## 2016-11-30 MED ORDER — PROCHLORPERAZINE MALEATE 10 MG PO TABS
10.0000 mg | ORAL_TABLET | Freq: Four times a day (QID) | ORAL | Status: DC | PRN
  Filled 2016-11-30: qty 1

## 2016-11-30 MED ORDER — LIDOCAINE HCL (CARDIAC) 20 MG/ML IV SOLN
INTRAVENOUS | Status: DC | PRN
  Administered 2016-11-30: 100 mg via INTRAVENOUS

## 2016-11-30 MED ORDER — FENTANYL CITRATE (PF) 100 MCG/2ML IJ SOLN
INTRAMUSCULAR | Status: DC | PRN
Start: 2016-11-30 — End: 2016-11-30
  Administered 2016-11-30 (×2): 100 ug via INTRAVENOUS
  Administered 2016-11-30: 50 ug via INTRAVENOUS
  Administered 2016-11-30: 25 ug via INTRAVENOUS
  Administered 2016-11-30: 50 ug via INTRAVENOUS
  Administered 2016-11-30: 100 ug via INTRAVENOUS

## 2016-11-30 MED ORDER — PROCHLORPERAZINE EDISYLATE 5 MG/ML IJ SOLN
5.0000 mg | Freq: Four times a day (QID) | INTRAMUSCULAR | Status: DC | PRN
  Administered 2016-11-30: 10 mg via INTRAVENOUS
  Filled 2016-11-30: qty 2

## 2016-11-30 MED ORDER — DEXAMETHASONE SODIUM PHOSPHATE 10 MG/ML IJ SOLN
INTRAMUSCULAR | Status: AC
  Filled 2016-11-30: qty 1

## 2016-11-30 MED ORDER — ONDANSETRON HCL 4 MG/2ML IJ SOLN
INTRAMUSCULAR | Status: AC
  Filled 2016-11-30: qty 2

## 2016-11-30 MED ORDER — ALUM & MAG HYDROXIDE-SIMETH 200-200-20 MG/5ML PO SUSP
30.0000 mL | ORAL | Status: DC | PRN
  Filled 2016-11-30: qty 30

## 2016-11-30 MED ORDER — ACETAMINOPHEN 500 MG PO TABS
1000.0000 mg | ORAL_TABLET | Freq: Four times a day (QID) | ORAL | Status: DC
  Administered 2016-11-30 – 2016-12-02 (×5): 1000 mg via ORAL
  Filled 2016-11-30 (×6): qty 2

## 2016-11-30 MED ORDER — GABAPENTIN 300 MG PO CAPS
300.0000 mg | ORAL_CAPSULE | ORAL | Status: AC
  Administered 2016-11-30: 300 mg via ORAL
  Filled 2016-11-30: qty 1

## 2016-11-30 MED ORDER — LIDOCAINE 2% (20 MG/ML) 5 ML SYRINGE
INTRAMUSCULAR | Status: AC
  Filled 2016-11-30: qty 5

## 2016-11-30 MED ORDER — BUPIVACAINE-EPINEPHRINE (PF) 0.25% -1:200000 IJ SOLN
INTRAMUSCULAR | Status: AC
  Filled 2016-11-30: qty 30

## 2016-11-30 MED ORDER — FENTANYL CITRATE (PF) 100 MCG/2ML IJ SOLN
INTRAMUSCULAR | Status: AC
  Filled 2016-11-30: qty 2

## 2016-11-30 MED ORDER — BUPIVACAINE-EPINEPHRINE 0.25% -1:200000 IJ SOLN
INTRAMUSCULAR | Status: DC | PRN
  Administered 2016-11-30: 17 mL

## 2016-11-30 MED ORDER — MEPERIDINE HCL 25 MG/ML IJ SOLN: 6.2500 mg | INTRAMUSCULAR | Status: DC | PRN

## 2016-11-30 MED ORDER — SODIUM CHLORIDE 0.9 % IV SOLN
INTRAVENOUS | Status: DC
  Administered 2016-11-30 – 2016-12-01 (×2): via INTRAVENOUS

## 2016-11-30 MED ORDER — PROPOFOL 10 MG/ML IV BOLUS
INTRAVENOUS | Status: AC
  Filled 2016-11-30: qty 20

## 2016-11-30 MED ORDER — GABAPENTIN 300 MG PO CAPS
300.0000 mg | ORAL_CAPSULE | Freq: Two times a day (BID) | ORAL | Status: DC
  Administered 2016-11-30 – 2016-12-02 (×5): 300 mg via ORAL
  Filled 2016-11-30 (×5): qty 1

## 2016-11-30 MED ORDER — OXYCODONE HCL 5 MG PO TABS
5.0000 mg | ORAL_TABLET | ORAL | Status: DC | PRN
  Administered 2016-11-30: 10 mg via ORAL
  Filled 2016-11-30: qty 2

## 2016-11-30 MED ORDER — ENOXAPARIN SODIUM 40 MG/0.4ML ~~LOC~~ SOLN
40.0000 mg | SUBCUTANEOUS | Status: DC
  Administered 2016-12-01 – 2016-12-02 (×2): 40 mg via SUBCUTANEOUS
  Filled 2016-11-30 (×2): qty 0.4

## 2016-11-30 SURGICAL SUPPLY — 62 items
ADH SKN CLS APL DERMABOND .7 (GAUZE/BANDAGES/DRESSINGS) ×1
APPLIER CLIP 5 13 M/L LIGAMAX5 (MISCELLANEOUS)
APPLIER CLIP ROT 10 11.4 M/L (STAPLE)
APR CLP MED LRG 11.4X10 (STAPLE)
APR CLP MED LRG 5 ANG JAW (MISCELLANEOUS)
BAG DECANTER FOR FLEXI CONT (MISCELLANEOUS) ×2 IMPLANT
BINDER ABDOMINAL 12 ML 46-62 (SOFTGOODS) ×2 IMPLANT
BLADE CLIPPER SURG (BLADE) ×1 IMPLANT
CANISTER SUCT 3000ML PPV (MISCELLANEOUS) IMPLANT
CHLORAPREP W/TINT 26ML (MISCELLANEOUS) ×2 IMPLANT
CLIP APPLIE 5 13 M/L LIGAMAX5 (MISCELLANEOUS) IMPLANT
CLIP APPLIE ROT 10 11.4 M/L (STAPLE) IMPLANT
CLSR STERI-STRIP ANTIMIC 1/2X4 (GAUZE/BANDAGES/DRESSINGS) ×2 IMPLANT
DECANTER SPIKE VIAL GLASS SM (MISCELLANEOUS) ×2 IMPLANT
DERMABOND ADVANCED (GAUZE/BANDAGES/DRESSINGS) ×1
DERMABOND ADVANCED .7 DNX12 (GAUZE/BANDAGES/DRESSINGS) ×1 IMPLANT
DEVICE SECURE STRAP 25 ABSORB (INSTRUMENTS) ×2 IMPLANT
DEVICE TROCAR PUNCTURE CLOSURE (ENDOMECHANICALS) ×2 IMPLANT
DRSG TEGADERM 2-3/8X2-3/4 SM (GAUZE/BANDAGES/DRESSINGS) ×8 IMPLANT
ELECT REM PT RETURN 9FT ADLT (ELECTROSURGICAL) ×2
ELECTRODE REM PT RTRN 9FT ADLT (ELECTROSURGICAL) ×1 IMPLANT
GLOVE BIO SURGEON STRL SZ7 (GLOVE) ×2 IMPLANT
GLOVE BIO SURGEON STRL SZ7.5 (GLOVE) ×2 IMPLANT
GLOVE BIOGEL M STRL SZ7.5 (GLOVE) ×1 IMPLANT
GLOVE BIOGEL PI IND STRL 7.0 (GLOVE) IMPLANT
GLOVE BIOGEL PI IND STRL 7.5 (GLOVE) IMPLANT
GLOVE BIOGEL PI IND STRL 8 (GLOVE) ×1 IMPLANT
GLOVE BIOGEL PI INDICATOR 7.0 (GLOVE) ×2
GLOVE BIOGEL PI INDICATOR 7.5 (GLOVE) ×1
GLOVE BIOGEL PI INDICATOR 8 (GLOVE) ×2
GLOVE ECLIPSE 7.0 STRL STRAW (GLOVE) ×1 IMPLANT
GLOVE ECLIPSE 7.5 STRL STRAW (GLOVE) ×2 IMPLANT
GOWN STRL REUS W/ TWL LRG LVL3 (GOWN DISPOSABLE) ×3 IMPLANT
GOWN STRL REUS W/ TWL XL LVL3 (GOWN DISPOSABLE) IMPLANT
GOWN STRL REUS W/TWL LRG LVL3 (GOWN DISPOSABLE) ×8
GOWN STRL REUS W/TWL XL LVL3 (GOWN DISPOSABLE) ×2
KIT BASIN OR (CUSTOM PROCEDURE TRAY) ×2 IMPLANT
KIT ROOM TURNOVER OR (KITS) ×2 IMPLANT
MARKER SKIN DUAL TIP RULER LAB (MISCELLANEOUS) ×2 IMPLANT
MESH VENTRALIGHT ST 8X10 (Mesh General) ×1 IMPLANT
NDL INSUFFLATION 14GA 120MM (NEEDLE) IMPLANT
NDL SPNL 22GX3.5 QUINCKE BK (NEEDLE) ×1 IMPLANT
NEEDLE INSUFFLATION 14GA 120MM (NEEDLE) ×2 IMPLANT
NEEDLE SPNL 22GX3.5 QUINCKE BK (NEEDLE) ×2 IMPLANT
NS IRRIG 1000ML POUR BTL (IV SOLUTION) ×2 IMPLANT
PAD ARMBOARD 7.5X6 YLW CONV (MISCELLANEOUS) ×4 IMPLANT
PEN SKIN MARKING BROAD (MISCELLANEOUS) ×1 IMPLANT
SCISSORS LAP 5X35 DISP (ENDOMECHANICALS) ×2 IMPLANT
SET IRRIG TUBING LAPAROSCOPIC (IRRIGATION / IRRIGATOR) IMPLANT
SLEEVE ENDOPATH XCEL 5M (ENDOMECHANICALS) ×3 IMPLANT
SUT MNCRL AB 4-0 PS2 18 (SUTURE) ×2 IMPLANT
SUT NOVA 0 T19/GS 22DT (SUTURE) ×2 IMPLANT
SUT VIC AB 3-0 SH 27 (SUTURE) ×6
SUT VIC AB 3-0 SH 27XBRD (SUTURE) IMPLANT
TOWEL OR 17X24 6PK STRL BLUE (TOWEL DISPOSABLE) ×1 IMPLANT
TOWEL OR 17X26 10 PK STRL BLUE (TOWEL DISPOSABLE) ×2 IMPLANT
TRAY FOLEY CATH SILVER 14FR (SET/KITS/TRAYS/PACK) ×2 IMPLANT
TRAY LAPAROSCOPIC MC (CUSTOM PROCEDURE TRAY) ×2 IMPLANT
TROCAR XCEL BLUNT TIP 100MML (ENDOMECHANICALS) IMPLANT
TROCAR XCEL NON-BLD 11X100MML (ENDOMECHANICALS) ×2 IMPLANT
TROCAR XCEL NON-BLD 5MMX100MML (ENDOMECHANICALS) ×2 IMPLANT
TUBING INSUFFLATION (TUBING) ×2 IMPLANT

## 2016-11-30 NOTE — Transfer of Care (Signed)
Immediate Anesthesia Transfer of Care Note  Patient: Chris [name]  Procedure(s) Performed: Procedure(s): LAPAROSCOPIC VENTRAL HERNIA REPAIR AND  REPAIR OF SEROSAL TEAR (N/A) INSERTION OF MESH (N/A)  Patient Location: PACU  Anesthesia Type:General  Level of Consciousness: awake, alert , oriented and patient cooperative  Airway & Oxygen Therapy: Patient Spontanous Breathing and Patient connected to nasal cannula oxygen  Post-op Assessment: Report given to RN and Post -op Vital signs reviewed and stable  Post vital signs: Reviewed and stable  Last Vitals:  Vitals:   11/30/16 0757 11/30/16 1251  BP: (!) 155/95   Pulse: 61   Resp: 20   Temp: 36.6 C (!) (P) 36.1 C  SpO2: 100%     Last Pain:  Vitals:   11/30/16 0757  TempSrc: Oral         Complications: No apparent anesthesia complications

## 2016-11-30 NOTE — Op Note (Signed)
11/30/2016  12:22 PM  PATIENT:  Chris RutherfordHank E Molinaro  61 y.o. male  Patient Care Team: Copland, Gwenlyn FoundJessica C, MD as PCP - General (Family Medicine) Nahser, Deloris PingPhilip J, MD as Consulting Physician (Cardiology)  PRE-OPERATIVE DIAGNOSIS:  SYMPTOMATIC VENTRAL INCISIONAL HERNIA  POST-OPERATIVE DIAGNOSIS:  SYMPTOMATIC VENTRAL INCISIONAL HERNIA; Small bowel seromyotomy   PROCEDURE:  Procedure(s): Laparoscopic repair of seromyotomy of small bowel  SURGEON:  Surgeon(s): Andria MeuseWhite, Shakeda Pearse M, MD  ASSISTANT: Jimmye NormanJames Wyatt, MD   ANESTHESIA: General  EBL:  Total I/O In: 1000 [I.V.:1000] Out: 360 [Urine:285; Blood:75]  DRAINS: None  SPECIMEN: None  COUNTS: Reported correct x2 at conclusion of case  INDICATION: Called intraoperatively to laparoscopically repair a small segment de-serosalization/seromyotomy of the small bowel during a laparoscopic ventral hernia repair   OR FINDINGS: Partial thickness; small bowel deserosalization repaired with Lembert sutures; lumen widely patent at conclusion  DESCRIPTION: The patient was already in the operating room under general endotracheal anesthesia after having had takedown of multiple loops of bowel from the ventral hernia he was undergoing for repair. The area of deserosalized small bowel spanned ~3-4cm in length and was partial thickness. No holes in the mucosa were noted. The seromyotomy was repaired laparoscopically using interrupted 3-0 Vicryl Lembert sutures. At conclusion the lumen was patent and the repair complete. The case was then turned back over to Dr. Lindie SpruceWyatt - please refer to his dictation for details regarding this portion of the procedure.  COMPLICATIONS: None  Stephanie Couphristopher M. Cliffton AstersWhite, M.D. [facility]

## 2016-11-30 NOTE — Progress Notes (Signed)
Orthopedic Tech Progress Note Patient Details:  Chris RutherfordHank E [name] June 12, 1955 [id number]  Ortho Devices Type of Ortho Device: Abdominal binder Ortho Device/Splint Location: applied abdominal binder to pt stomach area.  pt tolerated application well. Ortho Device/Splint Interventions: Application, Adjustment   Alvina ChouWilliams, Mahiya Kercheval C 11/30/2016, 1:46 PM

## 2016-11-30 NOTE — Anesthesia Preprocedure Evaluation (Signed)
Anesthesia Evaluation  Patient identified by MRN, date of birth, ID band Patient awake    Reviewed: Allergy & Precautions, NPO status , Patient's Chart, lab work & pertinent test results  Airway Mallampati: II  TM Distance: >3 FB Neck ROM: Full    Dental no notable dental hx. (+) Edentulous Upper, Edentulous Lower, Upper Dentures, Lower Dentures   Pulmonary sleep apnea , former smoker,    Pulmonary exam normal breath sounds clear to auscultation       Cardiovascular hypertension, + CAD, + Past MI and + Cardiac Stents (2009)  Normal cardiovascular exam Rhythm:Regular Rate:Normal     Neuro/Psych negative neurological ROS  negative psych ROS   GI/Hepatic negative GI ROS, Neg liver ROS,   Endo/Other  negative endocrine ROS  Renal/GU negative Renal ROS  negative genitourinary   Musculoskeletal negative musculoskeletal ROS (+)   Abdominal   Peds negative pediatric ROS (+)  Hematology negative hematology ROS (+)   Anesthesia Other Findings   Reproductive/Obstetrics negative OB ROS                             Anesthesia Physical Anesthesia Plan  ASA: III  Anesthesia Plan: General   Post-op Pain Management:    Induction: Intravenous  PONV Risk Score and Plan: 2 and Ondansetron, Dexamethasone and Treatment may vary due to age or medical condition  Airway Management Planned: Oral ETT  Additional Equipment:   Intra-op Plan:   Post-operative Plan: Extubation in OR  Informed Consent: I have reviewed the patients History and Physical, chart, labs and discussed the procedure including the risks, benefits and alternatives for the proposed anesthesia with the patient or authorized representative who has indicated his/her understanding and acceptance.   Dental advisory given  Plan Discussed with: CRNA  Anesthesia Plan Comments:         Anesthesia Quick Evaluation

## 2016-11-30 NOTE — H&P (Signed)
  Chris [name] 10/30/2016 3:22 PM Location: [facility] Patient #: [id number] DOB: 1956/02/02 Married / Language: English / Race: White Male   The patient is a 61 year old male.  Allergies (Chris [name], RMA; 10/30/2016 3:23 PM) ACE Inhibitors  ROPINIRole HCl *ANTIPARKINSON AND RELATED THERAPY AGENTS*  Allergies Reconciled   Medication History (Chris [name], RMA; 10/30/2016 3:23 PM) Aspirin (81MG  Tablet, Oral) Active. Atorvastatin Calcium (40MG  Tablet, Oral) Active. Clopidogrel Bisulfate (75MG  Tablet, Oral) Active. Nitroglycerin (0.4MG  Tab Sublingual, Sublingual) Active. Metoprolol Succinate ER (25MG  Tablet ER 24HR, Oral) Active. Omega 3 (1200MG  Capsule, Oral) Active. Medications Reconciled  Vitals (Chris A. Brown RMA; 10/30/2016 3:22 PM) 10/30/2016 3:22 PM Weight: 212.2 lb Height: 68in Body Surface Area: 2.1 m Body Mass Index: 32.26 kg/m  Temp.: 97.33F  Pulse: 74 (Regular)  P.OX: 99% (Room air) BP: 122/82 (Sitting, Left Arm, Standard) Current vital signs.: BP 155/95, P 61   Physical Exam (Chris Adkison O. Akeiba Axelson MD; 10/30/2016 3:47 PM) Abdomen Note: Seems incarcerated with wide midline wound. Hernia just to the left of the midline.  From prior surgery for open GB surgery No change in hernia since last examination.   Assessment & Plan Chris [name](Chris Henney O. Page Pucciarelli MD; 10/30/2016 3:53 PM) VENTRAL INCISIONAL HERNIA (K43.2) Impression: 4 cm ventral hernia incisional defect the is off to the left. He had previous [facility], and the hernia dfect is from that operation.  Patient is on Plavix and needs cardiac clearance. He will need to stop the Plavix five days prior to surgery. Will try to schdule by the end of august  Will attempt laparoscopic approach, but likely open repair will be necessary. Current Plans: Possible laparoscopic ventral hernia repair, possible open.  Chris LamasJames O. Gae BonWyatt, III, MD,  FACS [phone]--pager [phone]--office [facility]

## 2016-11-30 NOTE — Op Note (Signed)
OPERATIVE REPORT  DATE OF OPERATION: 11/30/2016  PATIENT:  Chris [name]  61 y.o. male  PRE-OPERATIVE DIAGNOSIS:  SYMPTOMATIC VENTRAL INCISIONAL HERNIA  POST-OPERATIVE DIAGNOSIS:  SYMPTOMATIC VENTRAL INCISIONAL HERNIA  INDICATION(S) FOR OPERATION:  Symptomatic ventral hernia  FINDINGS:  SB adherent to the anteriora abdominal wall.  Extensive hernia defect with Swiss cheese pattern, oval dimensions of 15 c 10 cm.Small bowel serosal tear.  PROCEDURE:  Procedure(s): LAPAROSCOPIC VENTRAL HERNIA REPAIR AND   REPAIR OF SMALL BOWEL SEROSAL TEAR INSERTION OF MESH 20 x 25 cm coated Ventral Lite mesh  SURGEON:  Surgeon(s): Jimmye NormanWyatt, Bettie Swavely, MD Andria MeuseWhite, Christopher M, MD  ASSISTANT: Cliffton AstersWhite, MD  ANESTHESIA:   general  COMPLICATIONS:  Serosal tear of small bowel without enterotomy  EBL: 50 ml  BLOOD ADMINISTERED: none  DRAINS: none   SPECIMEN:  No Specimen  COUNTS CORRECT:  YES  PROCEDURE DETAILS: The patient was taken to the operating room and placed on the table in supine position. After an adequate general endotracheal anesthetic was administered, he was prepped and draped in the usual sterile manner exposing his abdomen.  A proper timeout was performed identifying the patient and procedure to be performed. We entered the abdomen with a Veress needle through a stab wound made in the left upper quadrant with a #11 blade. We insufflated carbon dioxide gas up to a maximal intra-abdominal pressure of 15 mmHg and subsequently enlarged the Veress needle site to accommodate a 10/11 mm trocar and cannula which was passed under direct vision using the Optiview trocar.  We subsequently placed a left upper quadrant 5 mm cannula and a left lower quadrant 5 mm cannula under direct vision. Later in the case a right lower quadrant 5 mm cannula pass under direct vision.  Upon looking at the anterior abdominal wall there were significant omental adhesions all stuck in a Swiss cheese type pattern ventral  hernia measuring maximal size of approximately 10 x 15 cm in size. Just below on the inferior margin of the hernia defect there was a loop of small bowel, likely mid jejunum, there was attached to the wall with very dense adhesions. Upon removing the small bowel from the wall there was a traction tear of the serosa without a full-thickness enterotomy, on that required 3 laparoscopic intracorporeal 3-0 Vicryl  repair by my assistant Dr. Cliffton AstersWhite. Once this was done we used a coated piece of mesh measuring 20 x 25 cm in size with the coated side down against the bowel. 4 equally space sutures were placed on the fascial surface of the mesh which was subsequently passed through the 11 mm trocar into the perineal cavity looked and antibiotic solution prior to being implanted. Once he was inside we retrieved the stitches had been placed in 4 equally space sutures to the anterior abdominal wall holding it in place as he using absorbable tacker to secured in place. Once this was done we allowed the insufflated carbon dioxide gas to escape through the cannulas and then we removed them.  0.25% Marcaine with epinephrine was used at all sites. Reapproximated just 1112 mm skin using a running subcuticular stitch of 4-0 Monocryl. Dermabond Steri-Strips and Tegaderms views complete our dressings. All needle counts, sponge counts, and instrument counts were correct  PATIENT DISPOSITION:  PACU - hemodynamically stable.   Chris [name] 9/10/20181:11 PM

## 2016-12-01 ENCOUNTER — Encounter (HOSPITAL_COMMUNITY): Payer: Self-pay | Admitting: General Surgery

## 2016-12-01 DIAGNOSIS — Z87891 Personal history of nicotine dependence: Secondary | ICD-10-CM | POA: Diagnosis not present

## 2016-12-01 DIAGNOSIS — K432 Incisional hernia without obstruction or gangrene: Secondary | ICD-10-CM | POA: Diagnosis not present

## 2016-12-01 DIAGNOSIS — Z7982 Long term (current) use of aspirin: Secondary | ICD-10-CM | POA: Diagnosis not present

## 2016-12-01 DIAGNOSIS — I2510 Atherosclerotic heart disease of native coronary artery without angina pectoris: Secondary | ICD-10-CM | POA: Diagnosis not present

## 2016-12-01 DIAGNOSIS — G4730 Sleep apnea, unspecified: Secondary | ICD-10-CM | POA: Diagnosis not present

## 2016-12-01 DIAGNOSIS — I252 Old myocardial infarction: Secondary | ICD-10-CM | POA: Diagnosis not present

## 2016-12-01 DIAGNOSIS — I10 Essential (primary) hypertension: Secondary | ICD-10-CM | POA: Diagnosis not present

## 2016-12-01 DIAGNOSIS — Z955 Presence of coronary angioplasty implant and graft: Secondary | ICD-10-CM | POA: Diagnosis not present

## 2016-12-01 LAB — BASIC METABOLIC PANEL
Anion gap: 10 (ref 5–15)
BUN: 23 mg/dL — AB (ref 6–20)
CALCIUM: 8.3 mg/dL — AB (ref 8.9–10.3)
CO2: 23 mmol/L (ref 22–32)
CREATININE: 1.66 mg/dL — AB (ref 0.61–1.24)
Chloride: 100 mmol/L — ABNORMAL LOW (ref 101–111)
GFR calc Af Amer: 50 mL/min — ABNORMAL LOW (ref >60)
GFR calc non Af Amer: 43 mL/min — ABNORMAL LOW (ref >60)
GLUCOSE: 177 mg/dL — AB (ref 65–99)
Potassium: 5.2 mmol/L — ABNORMAL HIGH (ref 3.5–5.1)
Sodium: 133 mmol/L — ABNORMAL LOW (ref 135–145)

## 2016-12-01 LAB — CBC
HEMATOCRIT: 34.7 % — AB (ref 39.0–52.0)
Hemoglobin: 11.1 g/dL — ABNORMAL LOW (ref 13.0–17.0)
MCH: 27.8 pg (ref 26.0–34.0)
MCHC: 32 g/dL (ref 30.0–36.0)
MCV: 87 fL (ref 78.0–100.0)
Platelets: 371 10*3/uL (ref 150–400)
RBC: 3.99 MIL/uL — ABNORMAL LOW (ref 4.22–5.81)
RDW: 14.3 % (ref 11.5–15.5)
WBC: 15.5 10*3/uL — ABNORMAL HIGH (ref 4.0–10.5)

## 2016-12-01 MED ORDER — TRAMADOL HCL 50 MG PO TABS: 50.0000 mg | ORAL_TABLET | Freq: Four times a day (QID) | ORAL | 0 refills | Status: DC | PRN

## 2016-12-01 MED ORDER — ONDANSETRON 4 MG PO TBDP: 4.0000 mg | ORAL_TABLET | Freq: Four times a day (QID) | ORAL | 0 refills | Status: DC | PRN

## 2016-12-01 MED ORDER — OXYCODONE HCL 5 MG PO TABS: 5.0000 mg | ORAL_TABLET | ORAL | 0 refills | Status: DC | PRN

## 2016-12-01 NOTE — Discharge Instructions (Addendum)
Laparoscopic Ventral Hernia Repair, Care After °This sheet gives you information about how to care for yourself after your procedure. Your health care provider may also give you more specific instructions. If you have problems or questions, contact your health care provider. °What can I expect after the procedure? °After the procedure, it is common to have: °· Pain, discomfort, or soreness. ° °Follow these instructions at home: °Incision care °· Follow instructions from your health care provider about how to take care of your incision. Make sure you: °? Wash your hands with soap and water before you change your bandage (dressing) or before you touch your abdomen. If soap and water are not available, use hand sanitizer. °? Change your dressing as told by your health care provider. °? Leave stitches (sutures), skin glue, or adhesive strips in place. These skin closures may need to stay in place for 2 weeks or longer. If adhesive strip edges start to loosen and curl up, you may trim the loose edges. Do not remove adhesive strips completely unless your health care provider tells you to do that. °· Check your incision area every day for signs of infection. Check for: °? Redness, swelling, or pain. °? Fluid or blood. °? Warmth. °? Pus or a bad smell. °Bathing °· Do not take baths, swim, or use a hot tub until your health care provider approves. Ask your health care provider if you can take showers. You may only be allowed to take sponge baths for bathing. °· Keep your bandage (dressing) dry until your health care provider says it can be removed. °Activity °· Do not lift anything that is heavier than 10 lb (4.5 kg) until your health care provider approves. °· Do not drive or use heavy machinery while taking prescription pain medicine. Ask your health care provider when it is safe for you to drive or use heavy machinery. °· Do not drive for 24 hours if you were given a medicine to help you relax (sedative) during your  procedure. °· Rest as told by your health care provider. You may return to your normal activities when your health care provider approves. °General instructions °· Take over-the-counter and prescription medicines only as told by your health care provider. °· To prevent or treat constipation while you are taking prescription pain medicine, your health care provider may recommend that you: °? Take over-the-counter or prescription medicines. °? Eat foods that are high in fiber, such as fresh fruits and vegetables, whole grains, and beans. °? Limit foods that are high in fat and processed sugars, such as fried and sweet foods. °· Drink enough fluid to keep your urine clear or pale yellow. °· Hold a pillow over your abdomen when you cough or sneeze. This helps with pain. °· Keep all follow-up visits as told by your health care provider. This is important. °Contact a health care provider if: °· You have: °? A fever or chills. °? Redness, swelling, or pain around your incision. °? Fluid or blood coming from your incision. °? Pus or a bad smell coming from your incision. °? Pain that gets worse or does not get better with medicine. °? Nausea or vomiting. °? A cough. °? Shortness of breath. °· Your incision feels warm to the touch. °· You have not had a bowel movement in three days. °· You are not able to urinate. °Get help right away if: °· You have severe pain in your abdomen. °· You have persistent nausea and vomiting. °· You have redness,   warmth, or pain in your leg. °· You have chest pain. °· You have trouble breathing. °Summary °· After this procedure, it is common to have pain, discomfort, or soreness. °· Follow instructions from your health care provider about how to take care of your incision. °· Check your incision area every day for signs of infection. Report any signs of infection to your health care provider. °· Keep all follow-up visits as told by your health care provider. This is important. °This information  is not intended to replace advice given to you by your health care provider. Make sure you discuss any questions you have with your health care provider. ° °Mando Blatz O. Sirenia Whitis, III, MD, FACS °[phone]--pager °[phone]--office °[facility] ° °

## 2016-12-01 NOTE — Anesthesia Postprocedure Evaluation (Signed)
Anesthesia Post Note  Patient: Chris [name]  Procedure(s) Performed: Procedure(s) (LRB): LAPAROSCOPIC VENTRAL HERNIA REPAIR AND  REPAIR OF SEROSAL TEAR (N/A) INSERTION OF MESH (N/A)     Anesthesia Post Evaluation  Last Vitals:  Vitals:   12/01/16 0250 12/01/16 0458  BP: 125/77 128/76  Pulse: 82 82  Resp: 18 18  Temp: 36.6 C 36.8 C  SpO2: 97% 97%    Last Pain:  Vitals:   12/01/16 0800  TempSrc:   PainSc: 0-No pain   Pain Goal:                 Chris [name], Chris [name]

## 2016-12-01 NOTE — Progress Notes (Signed)
Pt unable to urinate. Denies pain and the urge to urinate. Bladder scann showed urine in the bladder. Will reassess.

## 2016-12-01 NOTE — Discharge Summary (Signed)
Physician Discharge Summary  Patient ID: Chris [name] MRN: [id number] DOB/AGE: 61-Jun-1957 61 y.o.  Admit date: 11/30/2016 Discharge date: 12/02/2016  Admission Diagnoses:  Discharge Diagnoses:  Active Problems:   Ventral hernia without obstruction or gangrene Perioperative anemia  Discharged Condition: good  Hospital Course: Admitted after laparoscopic ventral hernia repair with mesh.  Serosal tear at the time of surgery, no full thickness or mucosal injury.  Repaired intraoperatively. Without problems.  POD 1 doing well.  Able to void without catheter.  Afebrile.  Excellent bowel sounds.  Moderately tender.  His hemoglobin dropped from 11.1 POD 1 to 8.2 POD 2.  Remained stable on repeat.  BUN and creatinine which were elevated POD 1 improved to normal POD 2.  Patient passed a lot of flatus, but got a bit nauseated with solid lunch  Consults: None  Significant Diagnostic Studies: None  Treatments: IV hydration, antibiotics: Ancef and surgery: Laparoscopic ventral hernia repair  Discharge Exam: Blood pressure 135/66, pulse 77, temperature 97.8 F (36.6 C), temperature source Oral, resp. rate 18, weight 95.8 kg (211 lb 4.8 oz), SpO2 95 %. General appearance: alert, cooperative, appears stated age and mild distress Resp: clear to auscultation bilaterally GI: soft, non-tender; bowel sounds normal; no masses,  no organomegaly and mild tenderness  Disposition: 01-Home or Self Care  Discharge Instructions    Call MD for:  difficulty breathing, headache or visual disturbances    Complete by:  As directed    Call MD for:  extreme fatigue    Complete by:  As directed    Call MD for:  hives    Complete by:  As directed    Call MD for:  persistant dizziness or light-headedness    Complete by:  As directed    Call MD for:  persistant nausea and vomiting    Complete by:  As directed    Call MD for:  redness, tenderness, or signs of infection (pain, swelling, redness, odor or  green/yellow discharge around incision site)    Complete by:  As directed    Call MD for:  severe uncontrolled pain    Complete by:  As directed    Call MD for:  temperature >100.4    Complete by:  As directed    Diet - low sodium heart healthy    Complete by:  As directed    Discharge instructions    Complete by:  As directed    See specific ventral hernia instructions   Driving Restrictions    Complete by:  As directed    Minimum of one week   Increase activity slowly    Complete by:  As directed    Leave dressing on - Keep it clean, dry, and intact until clinic visit    Complete by:  As directed    Wear abdominal binder at all times   Lifting restrictions    Complete by:  As directed    Nothing greater than 20 pounds for 6 weeks     Allergies as of 12/02/2016      Reactions   Ace Inhibitors Rash   REACTION: Rash   Ropinirole Hcl Rash      Medication List    STOP taking these medications   clopidogrel 75 MG tablet Commonly known as:  PLAVIX     TAKE these medications   aspirin 81 MG tablet Take 81 mg by mouth daily.   atorvastatin 40 MG tablet Commonly known as:  LIPITOR Take 1 tablet (40  mg total) by mouth daily. What changed:  when to take this   azelastine 0.1 % nasal spray Commonly known as:  ASTELIN Place 1 spray into both nostrils 2 (two) times daily as needed for rhinitis. Use in each nostril as directed   docusate sodium 100 MG capsule Commonly known as:  COLACE Take 1 capsule (100 mg total) by mouth 3 (three) times daily.   ferrous gluconate 324 MG tablet Commonly known as:  FERGON Take 1 tablet (324 mg total) by mouth 2 (two) times daily with a meal.   Fish Oil 1200 MG Caps Take 2,400 mg by mouth 2 (two) times daily at 10 AM and 5 PM.   guaiFENesin 600 MG 12 hr tablet Commonly known as:  MUCINEX Take 600 mg by mouth 2 (two) times daily as needed for cough or to loosen phlegm.   metoprolol succinate 25 MG 24 hr tablet Commonly known as:   TOPROL-XL TAKE 1/2 TABLET BY MOUTH 2 TIMES DAILY What changed:  how much to take  how to take this  when to take this  additional instructions   multivitamin tablet Take 1 tablet by mouth daily. Centrum Silver for Men   nitroGLYCERIN 0.4 MG SL tablet Commonly known as:  NITROSTAT Place 1 tablet (0.4 mg total) under the tongue every 5 (five) minutes as needed for chest pain.   ondansetron 4 MG disintegrating tablet Commonly known as:  ZOFRAN-ODT Take 1 tablet (4 mg total) by mouth every 6 (six) hours as needed for nausea.   oxyCODONE 5 MG immediate release tablet Commonly known as:  Oxy IR/ROXICODONE Take 1-2 tablets (5-10 mg total) by mouth every 4 (four) hours as needed for moderate pain.   pantoprazole 40 MG tablet Commonly known as:  PROTONIX TAKE 1 TABLET BY MOUTH EVERY DAY AS NEEDED What changed:  See the new instructions.   sodium chloride 0.65 % Soln nasal spray Commonly known as:  OCEAN Place 1 spray into both nostrils as needed for congestion.   tadalafil 20 MG tablet Commonly known as:  CIALIS Take 40 mg by mouth daily as needed for erectile dysfunction.   traMADol 50 MG tablet Commonly known as:  ULTRAM Take 1 tablet (50 mg total) by mouth every 6 (six) hours as needed (mild pain).            Discharge Care Instructions        Start     Ordered   12/02/16 0000  docusate sodium (COLACE) 100 MG capsule  3 times daily     12/02/16 1457   12/02/16 0000  ferrous gluconate (FERGON) 324 MG tablet  2 times daily with meals     12/02/16 1457   12/01/16 0000  ondansetron (ZOFRAN-ODT) 4 MG disintegrating tablet  Every 6 hours PRN     12/01/16 0711   12/01/16 0000  oxyCODONE (OXY IR/ROXICODONE) 5 MG immediate release tablet  Every 4 hours PRN     12/01/16 0711   12/01/16 0000  traMADol (ULTRAM) 50 MG tablet  Every 6 hours PRN     12/01/16 0711   12/01/16 0000  Diet - low sodium heart healthy     12/01/16 0711   12/01/16 0000  Increase activity slowly      12/01/16 0711   12/01/16 0000  Discharge instructions    Comments:  See specific ventral hernia instructions   12/01/16 0711   12/01/16 0000  Lifting restrictions    Comments:  Nothing greater than 20  pounds for 6 weeks   12/01/16 0711   12/01/16 0000  Driving Restrictions    Comments:  Minimum of one week   12/01/16 0711   12/01/16 0000  Leave dressing on - Keep it clean, dry, and intact until clinic visit    Comments:  Wear abdominal binder at all times   12/01/16 0711   12/01/16 0000  Call MD for:  extreme fatigue     12/01/16 0711   12/01/16 0000  Call MD for:  persistant dizziness or light-headedness     12/01/16 0711   12/01/16 0000  Call MD for:  hives     12/01/16 0711   12/01/16 0000  Call MD for:  difficulty breathing, headache or visual disturbances     12/01/16 0711   12/01/16 0000  Call MD for:  redness, tenderness, or signs of infection (pain, swelling, redness, odor or green/yellow discharge around incision site)     12/01/16 0711   12/01/16 0000  Call MD for:  severe uncontrolled pain     12/01/16 0711   12/01/16 0000  Call MD for:  persistant nausea and vomiting     12/01/16 0711   12/01/16 0000  Call MD for:  temperature >100.4     12/01/16 0711       Signed: [name] Ashleigh Arya 12/02/2016, 2:58 PM

## 2016-12-01 NOTE — Progress Notes (Signed)
Pt ambulated this morning with minimal assist. Also able to urinate of urine. Bladder scan showed 30ml. Will continue to monitor.

## 2016-12-02 DIAGNOSIS — I252 Old myocardial infarction: Secondary | ICD-10-CM | POA: Diagnosis not present

## 2016-12-02 DIAGNOSIS — Z955 Presence of coronary angioplasty implant and graft: Secondary | ICD-10-CM | POA: Diagnosis not present

## 2016-12-02 DIAGNOSIS — I2510 Atherosclerotic heart disease of native coronary artery without angina pectoris: Secondary | ICD-10-CM | POA: Diagnosis not present

## 2016-12-02 DIAGNOSIS — K432 Incisional hernia without obstruction or gangrene: Secondary | ICD-10-CM | POA: Diagnosis not present

## 2016-12-02 DIAGNOSIS — I10 Essential (primary) hypertension: Secondary | ICD-10-CM | POA: Diagnosis not present

## 2016-12-02 DIAGNOSIS — Z87891 Personal history of nicotine dependence: Secondary | ICD-10-CM | POA: Diagnosis not present

## 2016-12-02 DIAGNOSIS — G4730 Sleep apnea, unspecified: Secondary | ICD-10-CM | POA: Diagnosis not present

## 2016-12-02 DIAGNOSIS — Z7982 Long term (current) use of aspirin: Secondary | ICD-10-CM | POA: Diagnosis not present

## 2016-12-02 LAB — CBC WITH DIFFERENTIAL/PLATELET
BASOS ABS: 0 10*3/uL (ref 0.0–0.1)
BASOS PCT: 0 %
Basophils Absolute: 0 10*3/uL (ref 0.0–0.1)
Basophils Relative: 0 %
EOS ABS: 0.1 10*3/uL (ref 0.0–0.7)
EOS ABS: 0.2 10*3/uL (ref 0.0–0.7)
EOS PCT: 1 %
EOS PCT: 2 %
HCT: 24.9 % — ABNORMAL LOW (ref 39.0–52.0)
HCT: 25 % — ABNORMAL LOW (ref 39.0–52.0)
Hemoglobin: 8.2 g/dL — ABNORMAL LOW (ref 13.0–17.0)
Hemoglobin: 8 g/dL — ABNORMAL LOW (ref 13.0–17.0)
LYMPHS ABS: 2 10*3/uL (ref 0.7–4.0)
Lymphocytes Relative: 20 %
Lymphocytes Relative: 18 %
Lymphs Abs: 1.9 10*3/uL (ref 0.7–4.0)
MCH: 28.2 pg (ref 26.0–34.0)
MCH: 27.7 pg (ref 26.0–34.0)
MCHC: 32.9 g/dL (ref 30.0–36.0)
MCHC: 32 g/dL (ref 30.0–36.0)
MCV: 85.6 fL (ref 78.0–100.0)
MCV: 86.5 fL (ref 78.0–100.0)
MONO ABS: 1.4 10*3/uL — AB (ref 0.1–1.0)
MONO ABS: 1.3 10*3/uL — AB (ref 0.1–1.0)
Monocytes Relative: 14 %
Monocytes Relative: 13 %
Neutro Abs: 6.6 10*3/uL (ref 1.7–7.7)
Neutro Abs: 7 10*3/uL (ref 1.7–7.7)
Neutrophils Relative %: 66 %
Neutrophils Relative %: 67 %
PLATELETS: 263 10*3/uL (ref 150–400)
PLATELETS: 234 10*3/uL (ref 150–400)
RBC: 2.91 MIL/uL — AB (ref 4.22–5.81)
RBC: 2.89 MIL/uL — AB (ref 4.22–5.81)
RDW: 14.6 % (ref 11.5–15.5)
RDW: 14.6 % (ref 11.5–15.5)
WBC: 10.1 10*3/uL (ref 4.0–10.5)
WBC: 10.3 10*3/uL (ref 4.0–10.5)

## 2016-12-02 LAB — BASIC METABOLIC PANEL
Anion gap: 4 — ABNORMAL LOW (ref 5–15)
BUN: 18 mg/dL (ref 6–20)
CO2: 25 mmol/L (ref 22–32)
CREATININE: 0.96 mg/dL (ref 0.61–1.24)
Calcium: 8.1 mg/dL — ABNORMAL LOW (ref 8.9–10.3)
Chloride: 105 mmol/L (ref 101–111)
GFR calc Af Amer: >60 mL/min (ref >60)
GLUCOSE: 137 mg/dL — AB (ref 65–99)
POTASSIUM: 4.6 mmol/L (ref 3.5–5.1)
SODIUM: 134 mmol/L — AB (ref 135–145)

## 2016-12-02 MED ORDER — FERROUS GLUCONATE 324 (38 FE) MG PO TABS
324.0000 mg | ORAL_TABLET | Freq: Two times a day (BID) | ORAL | Status: DC
  Administered 2016-12-02 (×2): 324 mg via ORAL
  Filled 2016-12-02 (×2): qty 1

## 2016-12-02 MED ORDER — DOCUSATE SODIUM 100 MG PO CAPS
100.0000 mg | ORAL_CAPSULE | Freq: Three times a day (TID) | ORAL | Status: DC
  Administered 2016-12-02: 100 mg via ORAL
  Filled 2016-12-02: qty 1

## 2016-12-02 MED ORDER — FERROUS GLUCONATE 324 (38 FE) MG PO TABS: 324.0000 mg | ORAL_TABLET | Freq: Two times a day (BID) | ORAL | 3 refills | Status: DC

## 2016-12-02 MED ORDER — DOCUSATE SODIUM 100 MG PO CAPS: 100.0000 mg | ORAL_CAPSULE | Freq: Three times a day (TID) | ORAL | 0 refills | Status: DC

## 2016-12-02 NOTE — Progress Notes (Signed)
AVS reviewed with patient and significant other.  Both confirm understanding of information presented.  [facility]

## 2016-12-02 NOTE — Progress Notes (Signed)
GS Progress Note Subjective: I did not feel comfortable sending the patient home yesterday because of his lab results, and today his hemoglobin came back 3 grams lower.  Intraoperative bleeding from being on ASA and having lots of omental adhesions.  Objective: Vital signs in last 24 hours: Temp:  [98.1 F (36.7 C)-99 F (37.2 C)] 98.1 F (36.7 C) (09/12 0444) Pulse Rate:  [84-96] 84 (09/12 0444) Resp:  [17-18] 18 (09/12 0444) BP: (128-139)/(68-71) 128/71 (09/12 0444) SpO2:  [90 %-94 %] 90 % (09/12 0444) Last BM Date: 11/30/16  Intake/Output from previous day: 09/11 0701 - 09/12 0700 In: 2819.5 [P.O.:1022; I.V.:1797.5] Out: 200 [Urine:200] Intake/Output this shift: No intake/output data recorded.  Lungs: Clear  Abd: distended, good bowel sounds.  No BM or flatus  Extremities: No clinical signs or symptoms of DVT  Neuro: Intact  Lab Results: CBC   Recent Labs  12/01/16 0352 12/02/16 0454  WBC 15.5* 10.1  HGB 11.1* 8.2*  HCT 34.7* 24.9*  PLT 371 263   BMET  Recent Labs  12/01/16 0352 12/02/16 0454  NA 133* 134*  K 5.2* 4.6  CL 100* 105  CO2 23 25  GLUCOSE 177* 137*  BUN 23* 18  CREATININE 1.66* 0.96  CALCIUM 8.3* 8.1*   PT/INR No results for input(s): LABPROT, INR in the last 72 hours. ABG No results for input(s): PHART, HCO3 in the last 72 hours.  Invalid input(s): PCO2, PO2  Studies/Results: No results found.  Anti-infectives: Anti-infectives    Start     Dose/Rate Route Frequency Ordered Stop   11/30/16 1445  ceFAZolin (ANCEF) IVPB 2g/100 mL premix     2 g 200 mL/hr over 30 Minutes Intravenous Every 8 hours 11/30/16 1436 11/30/16 1610   11/30/16 1033  polymyxin B 500,000 Units, bacitracin 50,000 Units in sodium chloride 0.9 % 500 mL irrigation  Status:  Discontinued       As needed 11/30/16 1034 11/30/16 1242   11/30/16 0739  ceFAZolin (ANCEF) IVPB 2g/100 mL premix  Status:  Discontinued     2 g 200 mL/hr over 30 Minutes Intravenous On  call to O.R. 11/30/16 0739 11/30/16 1428      Assessment/Plan: s/p Procedure(s): LAPAROSCOPIC VENTRAL HERNIA REPAIR AND  REPAIR OF SEROSAL TEAR INSERTION OF MESH Discharge Will recheck hemoglobin at noon and discontinue IVFs.  If Hgb stable as has been his BP, will let him go home.  Will started ferrous gluconate and vitamins for low hemoglobin.  LOS: 0 days    Marta LamasJames O. Gae BonWyatt, III, MD, FACS [phone]--pager [phone]--office [facility] 12/02/2016

## 2016-12-02 NOTE — Progress Notes (Signed)
Hemoglobin dropped from 8.2 to 8.0 this morning with a slight drop in platelets also.  He has expelled a lot of gas.  Abdomen much softer.  No peritonitis.  Okay for the patient to go home.  Will have him on iron, vitamins and stool softner.  Marta LamasJames O. Gae BonWyatt, III, MD, FACS [phone]--pager [phone]--office [facility]

## 2016-12-04 ENCOUNTER — Telehealth: Payer: Self-pay | Admitting: Cardiovascular Disease

## 2016-12-04 NOTE — Telephone Encounter (Signed)
Pt needs to know when to resume Plavix.   Advised to contact surgeon for those instructions.  He states understanding.

## 2016-12-04 NOTE — Telephone Encounter (Signed)
Pt's wife calling to verify when pt can resume Plavix, off 5 days prior to a Hernia surgery-pls call

## 2016-12-05 ENCOUNTER — Telehealth: Payer: Self-pay | Admitting: General Surgery

## 2016-12-05 ENCOUNTER — Encounter (HOSPITAL_COMMUNITY): Payer: Self-pay | Admitting: *Deleted

## 2016-12-05 ENCOUNTER — Emergency Department (HOSPITAL_COMMUNITY): Payer: 59

## 2016-12-05 ENCOUNTER — Observation Stay (HOSPITAL_COMMUNITY)
Admission: EM | Admit: 2016-12-05 | Discharge: 2016-12-07 | Disposition: A | Discharge status: Home / Self Care | Payer: 59 | Attending: Internal Medicine | Admitting: Internal Medicine

## 2016-12-05 DIAGNOSIS — I252 Old myocardial infarction: Secondary | ICD-10-CM | POA: Diagnosis not present

## 2016-12-05 DIAGNOSIS — Z955 Presence of coronary angioplasty implant and graft: Secondary | ICD-10-CM | POA: Insufficient documentation

## 2016-12-05 DIAGNOSIS — M1990 Unspecified osteoarthritis, unspecified site: Secondary | ICD-10-CM | POA: Diagnosis not present

## 2016-12-05 DIAGNOSIS — Z8042 Family history of malignant neoplasm of prostate: Secondary | ICD-10-CM | POA: Diagnosis not present

## 2016-12-05 DIAGNOSIS — I2699 Other pulmonary embolism without acute cor pulmonale: Secondary | ICD-10-CM | POA: Diagnosis not present

## 2016-12-05 DIAGNOSIS — D649 Anemia, unspecified: Secondary | ICD-10-CM | POA: Diagnosis present

## 2016-12-05 DIAGNOSIS — I2510 Atherosclerotic heart disease of native coronary artery without angina pectoris: Secondary | ICD-10-CM | POA: Insufficient documentation

## 2016-12-05 DIAGNOSIS — Z7902 Long term (current) use of antithrombotics/antiplatelets: Secondary | ICD-10-CM | POA: Insufficient documentation

## 2016-12-05 DIAGNOSIS — Z87891 Personal history of nicotine dependence: Secondary | ICD-10-CM | POA: Insufficient documentation

## 2016-12-05 DIAGNOSIS — Z79899 Other long term (current) drug therapy: Secondary | ICD-10-CM | POA: Diagnosis not present

## 2016-12-05 DIAGNOSIS — I10 Essential (primary) hypertension: Secondary | ICD-10-CM | POA: Diagnosis not present

## 2016-12-05 DIAGNOSIS — Z8249 Family history of ischemic heart disease and other diseases of the circulatory system: Secondary | ICD-10-CM | POA: Diagnosis not present

## 2016-12-05 DIAGNOSIS — R12 Heartburn: Secondary | ICD-10-CM

## 2016-12-05 DIAGNOSIS — Z7982 Long term (current) use of aspirin: Secondary | ICD-10-CM | POA: Diagnosis not present

## 2016-12-05 DIAGNOSIS — R600 Localized edema: Secondary | ICD-10-CM | POA: Insufficient documentation

## 2016-12-05 DIAGNOSIS — G2581 Restless legs syndrome: Secondary | ICD-10-CM | POA: Diagnosis not present

## 2016-12-05 DIAGNOSIS — G4730 Sleep apnea, unspecified: Secondary | ICD-10-CM | POA: Diagnosis not present

## 2016-12-05 DIAGNOSIS — E7800 Pure hypercholesterolemia, unspecified: Secondary | ICD-10-CM | POA: Diagnosis not present

## 2016-12-05 DIAGNOSIS — G4733 Obstructive sleep apnea (adult) (pediatric): Secondary | ICD-10-CM | POA: Diagnosis present

## 2016-12-05 DIAGNOSIS — K219 Gastro-esophageal reflux disease without esophagitis: Secondary | ICD-10-CM | POA: Diagnosis not present

## 2016-12-05 DIAGNOSIS — I2609 Other pulmonary embolism with acute cor pulmonale: Secondary | ICD-10-CM | POA: Diagnosis not present

## 2016-12-05 DIAGNOSIS — R0602 Shortness of breath: Secondary | ICD-10-CM | POA: Diagnosis not present

## 2016-12-05 DIAGNOSIS — E871 Hypo-osmolality and hyponatremia: Secondary | ICD-10-CM | POA: Diagnosis not present

## 2016-12-05 DIAGNOSIS — E785 Hyperlipidemia, unspecified: Secondary | ICD-10-CM | POA: Diagnosis not present

## 2016-12-05 HISTORY — DX: Anemia, unspecified: D64.9

## 2016-12-05 LAB — BASIC METABOLIC PANEL
ANION GAP: 8 (ref 5–15)
BUN: 7 mg/dL (ref 6–20)
CHLORIDE: 97 mmol/L — AB (ref 101–111)
CO2: 26 mmol/L (ref 22–32)
CREATININE: 0.81 mg/dL (ref 0.61–1.24)
Calcium: 8.5 mg/dL — ABNORMAL LOW (ref 8.9–10.3)
GFR calc non Af Amer: >60 mL/min (ref >60)
Glucose, Bld: 126 mg/dL — ABNORMAL HIGH (ref 65–99)
Potassium: 4.1 mmol/L (ref 3.5–5.1)
Sodium: 131 mmol/L — ABNORMAL LOW (ref 135–145)

## 2016-12-05 LAB — BRAIN NATRIURETIC PEPTIDE: B Natriuretic Peptide: 74.4 pg/mL (ref 0.0–100.0)

## 2016-12-05 LAB — CBC
HCT: 25.5 % — ABNORMAL LOW (ref 39.0–52.0)
HEMOGLOBIN: 8.3 g/dL — AB (ref 13.0–17.0)
MCH: 28.1 pg (ref 26.0–34.0)
MCHC: 32.5 g/dL (ref 30.0–36.0)
MCV: 86.4 fL (ref 78.0–100.0)
Platelets: 350 10*3/uL (ref 150–400)
RBC: 2.95 MIL/uL — AB (ref 4.22–5.81)
RDW: 15 % (ref 11.5–15.5)
WBC: 9.9 10*3/uL (ref 4.0–10.5)

## 2016-12-05 LAB — HEPARIN LEVEL (UNFRACTIONATED): HEPARIN UNFRACTIONATED: 0.19 [IU]/mL — AB (ref 0.30–0.70)

## 2016-12-05 LAB — I-STAT TROPONIN, ED: TROPONIN I, POC: 0 ng/mL (ref 0.00–0.08)

## 2016-12-05 MED ORDER — HEPARIN (PORCINE) IN NACL 100-0.45 UNIT/ML-% IJ SOLN
1600.0000 [IU]/h | INTRAMUSCULAR | Status: DC
  Administered 2016-12-06: 1600 [IU]/h via INTRAVENOUS
  Filled 2016-12-05: qty 250

## 2016-12-05 MED ORDER — PRAMIPEXOLE DIHYDROCHLORIDE 0.125 MG PO TABS
0.1250 mg | ORAL_TABLET | Freq: Once | ORAL | Status: AC
  Administered 2016-12-06: 0.125 mg via ORAL
  Filled 2016-12-05: qty 1

## 2016-12-05 MED ORDER — ACETAMINOPHEN 325 MG PO TABS: 650.0000 mg | ORAL_TABLET | Freq: Four times a day (QID) | ORAL | Status: DC | PRN

## 2016-12-05 MED ORDER — DOCUSATE SODIUM 100 MG PO CAPS
100.0000 mg | ORAL_CAPSULE | Freq: Three times a day (TID) | ORAL | Status: DC
  Administered 2016-12-05 – 2016-12-07 (×5): 100 mg via ORAL
  Filled 2016-12-05 (×5): qty 1

## 2016-12-05 MED ORDER — ALUM & MAG HYDROXIDE-SIMETH 200-200-20 MG/5ML PO SUSP
15.0000 mL | ORAL | Status: DC | PRN
  Filled 2016-12-05: qty 30

## 2016-12-05 MED ORDER — PANTOPRAZOLE SODIUM 40 MG PO TBEC
40.0000 mg | DELAYED_RELEASE_TABLET | Freq: Two times a day (BID) | ORAL | Status: DC
  Administered 2016-12-05 – 2016-12-06 (×2): 40 mg via ORAL
  Filled 2016-12-05 (×3): qty 1

## 2016-12-05 MED ORDER — ONDANSETRON HCL 4 MG/2ML IJ SOLN: 4.0000 mg | Freq: Four times a day (QID) | INTRAMUSCULAR | Status: DC | PRN

## 2016-12-05 MED ORDER — FERROUS GLUCONATE 324 (38 FE) MG PO TABS
324.0000 mg | ORAL_TABLET | Freq: Two times a day (BID) | ORAL | Status: DC
  Administered 2016-12-05 – 2016-12-07 (×4): 324 mg via ORAL
  Filled 2016-12-05 (×4): qty 1

## 2016-12-05 MED ORDER — ONDANSETRON HCL 4 MG PO TABS: 4.0000 mg | ORAL_TABLET | Freq: Four times a day (QID) | ORAL | Status: DC | PRN

## 2016-12-05 MED ORDER — ADULT MULTIVITAMIN W/MINERALS CH
1.0000 | ORAL_TABLET | Freq: Every day | ORAL | Status: DC
  Administered 2016-12-06 – 2016-12-07 (×2): 1 via ORAL
  Filled 2016-12-05 (×2): qty 1

## 2016-12-05 MED ORDER — PANTOPRAZOLE SODIUM 40 MG PO TBEC: 40.0000 mg | DELAYED_RELEASE_TABLET | Freq: Every day | ORAL | Status: DC

## 2016-12-05 MED ORDER — HYDROCODONE-ACETAMINOPHEN 5-325 MG PO TABS: 1.0000 | ORAL_TABLET | ORAL | Status: DC | PRN

## 2016-12-05 MED ORDER — CALCIUM CARBONATE ANTACID 500 MG PO CHEW
2.0000 | CHEWABLE_TABLET | Freq: Once | ORAL | Status: AC
  Administered 2016-12-05: 400 mg via ORAL
  Filled 2016-12-05: qty 2

## 2016-12-05 MED ORDER — GUAIFENESIN ER 600 MG PO TB12: 600.0000 mg | ORAL_TABLET | Freq: Two times a day (BID) | ORAL | Status: DC | PRN

## 2016-12-05 MED ORDER — METOCLOPRAMIDE HCL 5 MG/ML IJ SOLN
10.0000 mg | Freq: Two times a day (BID) | INTRAMUSCULAR | Status: AC
  Administered 2016-12-05 – 2016-12-06 (×2): 10 mg via INTRAVENOUS
  Filled 2016-12-05 (×2): qty 2

## 2016-12-05 MED ORDER — AZELASTINE HCL 0.1 % NA SOLN
1.0000 | Freq: Two times a day (BID) | NASAL | Status: DC | PRN
  Filled 2016-12-05: qty 30

## 2016-12-05 MED ORDER — SALINE SPRAY 0.65 % NA SOLN
1.0000 | NASAL | Status: DC | PRN
  Filled 2016-12-05: qty 44

## 2016-12-05 MED ORDER — METOPROLOL SUCCINATE ER 25 MG PO TB24
12.5000 mg | ORAL_TABLET | Freq: Every day | ORAL | Status: DC
  Administered 2016-12-06 – 2016-12-07 (×2): 12.5 mg via ORAL
  Filled 2016-12-05 (×3): qty 1

## 2016-12-05 MED ORDER — ATORVASTATIN CALCIUM 40 MG PO TABS
40.0000 mg | ORAL_TABLET | Freq: Every day | ORAL | Status: DC
  Administered 2016-12-06 – 2016-12-07 (×2): 40 mg via ORAL
  Filled 2016-12-05 (×2): qty 1

## 2016-12-05 MED ORDER — TRAMADOL HCL 50 MG PO TABS: 50.0000 mg | ORAL_TABLET | Freq: Four times a day (QID) | ORAL | Status: DC | PRN

## 2016-12-05 MED ORDER — ONDANSETRON 4 MG PO TBDP: 4.0000 mg | ORAL_TABLET | Freq: Four times a day (QID) | ORAL | Status: DC | PRN

## 2016-12-05 MED ORDER — ALBUTEROL SULFATE (2.5 MG/3ML) 0.083% IN NEBU: 5.0000 mg | INHALATION_SOLUTION | Freq: Once | RESPIRATORY_TRACT | Status: DC

## 2016-12-05 MED ORDER — SODIUM CHLORIDE 0.9 % IV SOLN
INTRAVENOUS | Status: DC
  Administered 2016-12-05: 1000 mL via INTRAVENOUS

## 2016-12-05 MED ORDER — HEPARIN BOLUS VIA INFUSION
4500.0000 [IU] | Freq: Once | INTRAVENOUS | Status: AC
  Administered 2016-12-05: 4500 [IU] via INTRAVENOUS
  Filled 2016-12-05: qty 4500

## 2016-12-05 MED ORDER — OXYCODONE HCL 5 MG PO TABS: 5.0000 mg | ORAL_TABLET | ORAL | Status: DC | PRN

## 2016-12-05 MED ORDER — SENNOSIDES-DOCUSATE SODIUM 8.6-50 MG PO TABS: 1.0000 | ORAL_TABLET | Freq: Every evening | ORAL | Status: DC | PRN

## 2016-12-05 MED ORDER — ACETAMINOPHEN 650 MG RE SUPP: 650.0000 mg | Freq: Four times a day (QID) | RECTAL | Status: DC | PRN

## 2016-12-05 MED ORDER — NITROGLYCERIN 0.4 MG SL SUBL
0.4000 mg | SUBLINGUAL_TABLET | SUBLINGUAL | Status: DC | PRN
Start: 2016-12-05 — End: 2016-12-07

## 2016-12-05 MED ORDER — HEPARIN (PORCINE) IN NACL 100-0.45 UNIT/ML-% IJ SOLN
1400.0000 [IU]/h | INTRAMUSCULAR | Status: DC
  Administered 2016-12-05: 1400 [IU]/h via INTRAVENOUS
  Filled 2016-12-05: qty 250

## 2016-12-05 MED ORDER — IOPAMIDOL (ISOVUE-370) INJECTION 76%
INTRAVENOUS | Status: AC
  Administered 2016-12-05: 100 mL
  Filled 2016-12-05: qty 100

## 2016-12-05 MED ORDER — TADALAFIL 20 MG PO TABS: 40.0000 mg | ORAL_TABLET | Freq: Every day | ORAL | Status: DC | PRN

## 2016-12-05 NOTE — ED Triage Notes (Signed)
To ED with complaint of SOB while laying down to sleep. States he had hernia repair last Monday - no pain at surgical site. Resp appear even and unlabored in triage. States he was unable to sleep at all last pm due to walking around the house all night. Skin w/d.

## 2016-12-05 NOTE — ED Notes (Signed)
Pt returned from xray

## 2016-12-05 NOTE — H&P (Signed)
History and Physical    Chris [name]:[id number] DOB: 05/02/1955 DOA: 12/05/2016  PCP: Chris Cables, MD Patient coming from: home  Chief Complaint: Shortness of breath  HPI: Chris [name] is a 61 y.o. male with medical history significant for CAD status post stents 2009, obstructive sleep apnea, hypertension, GERD, recent hernia repair presents to emergency Department chief complaint shortness of breath. Initial evaluation includes CT of chest revealing small nonocclusive pulmonary embolus. [facility] asked to admit  Information is obtained from the patient and his wife is at the bedside. He reports he underwent hernia repair 5 days ago. He went home 4 days ago and says he has not felt well since. Since he was discharged she's experienced "acid reflux". Also experience some bloating intermittent nausea without vomiting. He also described his legs as "tight". Last night he was unable to sleep and relied down due to shortness of breath. He states he was short of breath with exertion and also with lying flat. He denies headache dizziness syncope or near-syncope. He denies chest pain palpitations diaphoresis. He notes that his right leg is usually swollen as it was broken several years ago. He reports having a BM this morning that was normal in color and consistency.    ED Course: Emergency department is afebrile hemodynamically stable and not hypoxic. Heparin drip was initiated  Review of Systems: As per HPI otherwise all other systems reviewed and are negative.   Ambulatory Status: Ambulates independently  Past Medical History:  Diagnosis Date  . Acute MI, inferior wall, initial episode of care [facility]) 2009  . Anemia   . Arthritis   . Coronary atherosclerosis of unspecified type of vessel, native or graft   . GERD (gastroesophageal reflux disease)   . High cholesterol   . Hypertension   . Seasonal allergies   . Sleep apnea    "I've never worn a mask" (09/17/2016)     Past Surgical History:  Procedure Laterality Date  . APPENDECTOMY    . CHOLECYSTECTOMY OPEN  ~ 2008   "it exploded"  . CORONARY ANGIOPLASTY WITH STENT PLACEMENT  2009    2.75 x 28 mm Promus stent to the RCA  . FRACTURE SURGERY    . HERNIA REPAIR    . INSERTION OF MESH N/A 11/30/2016   Procedure: INSERTION OF MESH;  Surgeon: [name], MD;  Location: [facility] OR;  Service: General;  Laterality: N/A;  . LAPAROSCOPIC APPENDECTOMY N/A 09/16/2016   Procedure: APPENDECTOMY LAPAROSCOPIC;  Surgeon: [name], MD;  Location: [facility] OR;  Service: General;  Laterality: N/A;  . LAPAROSCOPIC INCISIONAL / UMBILICAL / VENTRAL HERNIA REPAIR  11/30/2016   w/repair serosal tear  . ORIF ANKLE FRACTURE Left 11/27/2015   Procedure: OPEN REDUCTION INTERNAL FIXATION (ORIF) ANKLE FRACTURE;  Surgeon: [name], MD;  Location: [facility];  Service: Orthopedics;  Laterality: Left;  [name] VENTRAL HERNIA REPAIR N/A 11/30/2016   Procedure: LAPAROSCOPIC VENTRAL HERNIA REPAIR AND  REPAIR OF SEROSAL TEAR;  Surgeon: [name], MD;  Location: [facility] OR;  Service: General;  Laterality: N/A;    Social History   Social History  . Marital status: Married    Spouse name: N/A  . Number of children: N/A  . 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 Smoking status: Former Smoker    Packs/day: 2.50    Years: 36.00    Types: Cigarettes  Quit date: 08/22/2007  . Smokeless tobacco: Never Used  . Alcohol use 3.6 oz/week    6 Cans of beer per week  . Drug use: No  . Sexual activity: Yes   Other Topics Concern  . Not on file   Social History Narrative   Lives with wife. Exercises occasionally.  Lives at home with his wife. He is employed as a Journalist, newspaper at [employer]  Allergies  Allergen Reactions  . Ace Inhibitors Rash  . Ropinirole Hcl Rash    Family History  Problem Relation Age of Onset  . Heart disease Mother   .  Hypertension Father   . Prostate cancer Brother   . CAD Brother     Prior to Admission medications   Medication Sig Start Date End Date Taking? Authorizing Provider  aspirin 81 MG tablet Take 81 mg by mouth daily.    Yes [provider]  atorvastatin (LIPITOR) 40 MG tablet Take 1 tablet (40 mg total) by mouth daily. 12/11/15  Yes Nahser, Deloris Ping, MD  azelastine (ASTELIN) 0.1 % nasal spray Place 1 spray into both nostrils 2 (two) times daily as needed for rhinitis. Use in each nostril as directed   Yes [provider]  docusate sodium (COLACE) 100 MG capsule Take 1 capsule (100 mg total) by mouth 3 (three) times daily. 12/02/16  Yes [name], MD  ferrous gluconate (FERGON) 324 MG tablet Take 1 tablet (324 mg total) by mouth 2 (two) times daily with a meal. 12/02/16  Yes [name], MD  guaiFENesin (MUCINEX) 600 MG 12 hr tablet Take 600 mg by mouth 2 (two) times daily as needed for cough or to loosen phlegm.   Yes [provider]  metoprolol succinate (TOPROL-XL) 25 MG 24 hr tablet TAKE 1/2 TABLET BY MOUTH 2 TIMES DAILY Patient taking differently: Take 12.5 mg by mouth 2 (two) times daily. TAKE 1/2 TABLET BY MOUTH 2 TIMES DAILY 07/22/16  Yes Nahser, Deloris Ping, MD  Multiple Vitamin (MULTIVITAMIN) tablet Take 1 tablet by mouth daily. Centrum Silver for Men   Yes [provider]  nitroGLYCERIN (NITROSTAT) 0.4 MG SL tablet Place 1 tablet (0.4 mg total) under the tongue every 5 (five) minutes as needed for chest pain. 07/01/16 12/05/16 Yes Bhagat, Bhavinkumar, PA  Omega-3 Fatty Acids (FISH OIL) 1200 MG CAPS Take 2,400 mg by mouth 2 (two) times daily at 10 AM and 5 PM.    Yes [provider]  ondansetron (ZOFRAN-ODT) 4 MG disintegrating tablet Take 1 tablet (4 mg total) by mouth every 6 (six) hours as needed for nausea. 12/01/16  Yes [name], MD  oxyCODONE (OXY IR/ROXICODONE) 5 MG immediate release tablet Take 1-2 tablets (5-10 mg total) by mouth every 4  (four) hours as needed for moderate pain. 12/01/16  Yes [name], MD  pantoprazole (PROTONIX) 40 MG tablet TAKE 1 TABLET BY MOUTH EVERY DAY AS NEEDED Patient taking differently: TAKE 40 MG BY MOUTH EVERY DAY AS NEEDED ACID REFLUX 09/28/16  Yes Jeffery, Chelle, PA-C  sodium chloride (OCEAN) 0.65 % SOLN nasal spray Place 1 spray into both nostrils as needed for congestion.   Yes [provider]  traMADol (ULTRAM) 50 MG tablet Take 1 tablet (50 mg total) by mouth every 6 (six) hours as needed (mild pain). 12/01/16  Yes [name], MD  tadalafil (CIALIS) 20 MG tablet Take 40 mg by mouth daily as needed for erectile dysfunction.    [provider]  Physical Exam: Vitals:   12/05/16 1515 12/05/16 1530 12/05/16 1545 12/05/16 1600  BP: (!) 143/78 127/83 132/69 122/77  Pulse:      Resp: 12 14 13 16   Temp:      TempSrc:      SpO2:      Weight:      Height:         General:  Appears calm and comfortable, sitting in chair in room in no acute distress Eyes:  PERRL, EOMI, normal lids, iris ENT:  grossly normal hearing, lips & tongue, mucous membranes of his mouth are sightly dry and pink Neck:  no LAD, masses or thyromegaly Cardiovascular:  RRR, no m/r/g. Right leg with 1+ pitting edema. Left leg with trace lower extremity edema Respiratory:   Normal respiratory effort. Breath sounds somewhat distant particularly on the left no wheezes no crackles Abdomen:  soft, ntnd, positive bowel sounds but sluggish several laparoscopic surgical sites each clean and dry no drainage no erythema Skin:  no rash or induration seen on limited exam Musculoskeletal:  grossly normal tone BUE/BLE, good ROM, no bony abnormality Psychiatric:  grossly normal mood and affect, speech fluent and appropriate, AOx3 Neurologic:  CN 2-12 grossly intact, moves all extremities in coordinated fashion, sensation intact  Labs on Admission: I have personally reviewed following labs and imaging  studies  CBC:  Recent Labs Lab 12/01/16 0352 12/02/16 0454 12/02/16 1155 12/05/16 1120  WBC 15.5* 10.1 10.3 9.9  NEUTROABS  --  6.6 7.0  --   HGB 11.1* 8.2* 8.0* 8.3*  HCT 34.7* 24.9* 25.0* 25.5*  MCV 87.0 85.6 86.5 86.4  PLT 371 263 234 350   Basic Metabolic Panel:  Recent Labs Lab 12/01/16 0352 12/02/16 0454 12/05/16 1120  NA 133* 134* 131*  K 5.2* 4.6 4.1  CL 100* 105 97*  CO2 23 25 26   GLUCOSE 177* 137* 126*  BUN 23* 18 7  CREATININE 1.66* 0.96 0.81  CALCIUM 8.3* 8.1* 8.5*   GFR: Estimated Creatinine Clearance: 107.3 mL/min (by C-G formula based on SCr of 0.81 mg/dL). Liver Function Tests: No results for input(s): AST, ALT, ALKPHOS, BILITOT, PROT, ALBUMIN in the last 168 hours. No results for input(s): LIPASE, AMYLASE in the last 168 hours. No results for input(s): AMMONIA in the last 168 hours. Coagulation Profile: No results for input(s): INR, PROTIME in the last 168 hours. Cardiac Enzymes: No results for input(s): CKTOTAL, CKMB, CKMBINDEX, TROPONINI in the last 168 hours. BNP (last 3 results) No results for input(s): PROBNP in the last 8760 hours. HbA1C: No results for input(s): HGBA1C in the last 72 hours. CBG: No results for input(s): GLUCAP in the last 168 hours. Lipid Profile: No results for input(s): CHOL, HDL, LDLCALC, TRIG, CHOLHDL, LDLDIRECT in the last 72 hours. Thyroid Function Tests: No results for input(s): TSH, T4TOTAL, FREET4, T3FREE, THYROIDAB in the last 72 hours. Anemia Panel: No results for input(s): VITAMINB12, FOLATE, FERRITIN, TIBC, IRON, RETICCTPCT in the last 72 hours. Urine analysis:    Component Value Date/Time   COLORURINE YELLOW 09/16/2016 1818   APPEARANCEUR CLEAR 09/16/2016 1818   LABSPEC 1.029 09/16/2016 1818   PHURINE 7.0 09/16/2016 1818   GLUCOSEU NEGATIVE 09/16/2016 1818   HGBUR NEGATIVE 09/16/2016 1818   BILIRUBINUR NEGATIVE 09/16/2016 1818   BILIRUBINUR negative 09/16/2016 1017   KETONESUR NEGATIVE 09/16/2016  1818   PROTEINUR NEGATIVE 09/16/2016 1818   UROBILINOGEN 0.2 09/16/2016 1017   NITRITE NEGATIVE 09/16/2016 1818   LEUKOCYTESUR NEGATIVE 09/16/2016 1818  Creatinine Clearance: Estimated Creatinine Clearance: 107.3 mL/min (by C-G formula based on SCr of 0.81 mg/dL).  Sepsis Labs: (procalcitonin:4,lacticidven:4) )No results found for this or any previous visit (from the past 240 hour(s)).   Radiological Exams on Admission: Dg Chest 2 View  Result Date: 12/05/2016 CLINICAL DATA:  Shortness of breath and weakness. EXAM: CHEST  2 VIEW COMPARISON:  Chest x-ray dated in June 22, 2016. FINDINGS: The cardiomediastinal silhouette is normal in size. Normal pulmonary vascularity. Bibasilar atelectasis. No focal consolidation, pleural effusion, or pneumothorax. No acute osseous abnormality. IMPRESSION: Bibasilar atelectasis.  No active cardiopulmonary disease. Electronically Signed   By: Obie Dredge M.D.   On: 12/05/2016 10:54   Ct Angio Chest Pe W/cm &/or Wo Cm  Result Date: 12/05/2016 CLINICAL DATA:  Shortness of breath.  Recent surgery. EXAM: CT ANGIOGRAPHY CHEST WITH CONTRAST TECHNIQUE: Multidetector CT imaging of the chest was performed using the standard protocol during bolus administration of intravenous contrast. Multiplanar CT image reconstructions and MIPs were obtained to evaluate the vascular anatomy. CONTRAST:  100 mL Isovue 370 COMPARISON:  Chest radiograph 12/05/2016 FINDINGS: Cardiovascular: Contrast injection is sufficient to demonstrate satisfactory opacification of the pulmonary arteries to the segmental level. There is nonocclusive thrombus within the proximal apical and posterior segmental arteries of the right upper lobe. No other filling defect. The main pulmonary artery is within normal limits for size. There is no CT evidence of acute right heart strain. The visualized aorta is normal. There is a normal variant aortic arch branching pattern with the brachiocephalic and  left common carotid arteries sharing a common origin. Heart size is normal, without pericardial effusion. Multifocal coronary artery calcification. Mediastinum/Nodes: No mediastinal, hilar or axillary lymphadenopathy. The visualized thyroid and thoracic esophageal course are unremarkable. Lungs/Pleura: Mild biapical emphysema. No pulmonary nodules or masses. No pleural effusion or pneumothorax. No focal airspace consolidation. No focal pleural abnormality. Upper Abdomen: Contrast bolus timing is not optimized for evaluation of the abdominal organs. Within this limitation, the visualized organs of the upper abdomen are normal. Musculoskeletal: No chest wall abnormality. No acute or significant osseous findings. Review of the MIP images confirms the above findings. IMPRESSION: 1. Small nonocclusive pulmonary embolus within the proximal apical and posterior segmental arteries of the right upper lobe. 2. No evidence of right heart strain. 3.  Emphysema (ICD10-J43.9).  Coronary artery atherosclerosis. Critical Value/emergent results were called by telephone at the time of interpretation on 12/05/2016 at 2:32 pm to Dr. Frederick Peers , who verbally acknowledged these results. Electronically Signed   By: Deatra Robinson M.D.   On: 12/05/2016 14:33    EKG: Independently reviewed. Normal sinus rhythm Incomplete right bundle branch block Inferior infarct , Assessment/Plan Principal Problem:   Pulmonary embolism (HCC) Active Problems:   Hyperlipidemia   OBSTRUCTIVE SLEEP APNEA   CAD (coronary artery disease)   RESTLESS LEGS SYNDROME   Hyponatremia   Anemia   GERD (gastroesophageal reflux disease)   #1. Pulmonary embolism. CT angio of chest with small non-occlusive PE within proximal apical and posterior segmental arteries of RUL. Patient's home medications include Plavix (stents 2009) of which he did not take preoperatively and was due to start today postoperatively. She is not tachycardic no tachypnea no hypoxia.  Initial troponin negative. EKG without acute changes -Heparin per pharmacy -Obtain echocardiogram -will likely need long term anti-coagulation  #2. Hyponatremia. Mild. Likely related decreased oral intake since surgery. -Gentle IV fluids -Recheck in the morning  #3. Anemia. Patient with history of same. Home medications  include iron supplement. Hemoglobin 8.3. Down from 11 preoperatively. Likely related to surgery -Continue home meds -Recheck  #4. GERD. -Protonix  5. CAD. History of MI with stents 2009. No chest pain. EKG without acute changes. Initial troponin negative. -Continue home meds except for aspirin   DVT prophylaxis: heparin gtt  Code Status: full Family Communication: wife at bedside  Disposition Plan: home hopefully tomorrow  Consults called: none  Admission status: obs    Toya Smothers M MD [facility]  If 7PM-7AM, please contact night-coverage [email or web address] Password [facility]  12/05/2016, 4:31 PM

## 2016-12-05 NOTE — Progress Notes (Signed)
Patient trasfered from ED to [phone] via stretcher; alert and oriented x 4; no complaints of pain; IV in LAC running Heparin@14cc /hr; redness noticed on buttocks (blanchable).  Orient patient to room and unit; gave patient care guide; instructed how to use the call bell and  fall risk precautions. Will continue to monitor the patient.

## 2016-12-05 NOTE — ED Provider Notes (Addendum)
[facility]-EMERGENCY DEPT Provider Note   CSN: [id number] Arrival date & time: 12/05/16  1021     History   Chief Complaint Chief Complaint  Patient presents with  . Shortness of Breath    HPI Chris [name] is a 61 y.o. male.  61yo M w/ PMH including CAD s/p MI, GERD, HTN, HLD, OSA who p/w shortness of breath. The patient states that during 2 nights ago and worsening last night, he has had some shortness of breath predominantly when he is trying to lay down and go to sleep. He was unable to sleep all night because of the shortness of breath. He reports that he had a ventral hernia repair 5 days ago and has been healing appropriately at home. Last BM was this morning. He does note some b/l LE edema. No fevers or vomiting. No chest pain. No drainage from incision sites.   The history is provided by the patient.    Past Medical History:  Diagnosis Date  . Acute MI, inferior wall, initial episode of care [facility]) 2009  . Anemia   . Arthritis   . Coronary atherosclerosis of unspecified type of vessel, native or graft   . GERD (gastroesophageal reflux disease)   . High cholesterol   . Hypertension   . Seasonal allergies   . Sleep apnea    "I've never worn a mask" (09/17/2016)    Patient Active Problem List   Diagnosis Date Noted  . Hyponatremia 12/05/2016  . Pulmonary embolism (HCC) 12/05/2016  . GERD (gastroesophageal reflux disease) 12/05/2016  . Anemia   . Ventral hernia without obstruction or gangrene 11/30/2016  . Acute appendicitis 09/16/2016  . Edentulous 06/11/2016  . Closed fracture of left lateral malleolus 11/25/2015  . Overweight 05/21/2014  . History of MI (myocardial infarction) 05/21/2014  . Unstable angina (HCC) 10/10/2012  . ED (erectile dysfunction) 07/25/2012  . RESTLESS LEGS SYNDROME 02/20/2010  . Hyperlipidemia 02/19/2010  . OBSTRUCTIVE SLEEP APNEA 02/19/2010  . CAD (coronary artery disease) 02/19/2010    Past Surgical History:  Procedure Laterality  Date  . APPENDECTOMY    . CHOLECYSTECTOMY OPEN  ~ 2008   "it exploded"  . CORONARY ANGIOPLASTY WITH STENT PLACEMENT  2009    2.75 x 28 mm Promus stent to the RCA  . FRACTURE SURGERY    . HERNIA REPAIR    . INSERTION OF MESH N/A 11/30/2016   Procedure: INSERTION OF MESH;  Surgeon: [name], MD;  [facility] OR;  Service: General;  Laterality: N/A;  . LAPAROSCOPIC APPENDECTOMY N/A 09/16/2016   Procedure: APPENDECTOMY LAPAROSCOPIC;  Surgeon: [name], MD;  Location: [facility] OR;  Service: General;  Laterality: N/A;  . LAPAROSCOPIC INCISIONAL / UMBILICAL / VENTRAL HERNIA REPAIR  11/30/2016   w/repair serosal tear  . ORIF ANKLE FRACTURE Left 11/27/2015   Procedure: OPEN REDUCTION INTERNAL FIXATION (ORIF) ANKLE FRACTURE;  Surgeon: [name], MD;  Location: [facility];  Service: Orthopedics;  Laterality: Left;  [name] VENTRAL HERNIA REPAIR N/A 11/30/2016   Procedure: LAPAROSCOPIC VENTRAL HERNIA REPAIR AND  REPAIR OF SEROSAL TEAR;  Surgeon: [name], MD;  Location: [facility] OR;  Service: General;  Laterality: N/A;       Home Medications    Prior to Admission medications   Medication Sig Start Date End Date Taking? Authorizing Provider  aspirin 81 MG tablet Take 81 mg by mouth daily.    Yes [provider]  atorvastatin (LIPITOR) 40 MG tablet Take 1 tablet (40 mg total)  by mouth daily. 12/11/15  Yes Nahser, Deloris Ping, MD  azelastine (ASTELIN) 0.1 % nasal spray Place 1 spray into both nostrils 2 (two) times daily as needed for rhinitis. Use in each nostril as directed   Yes [provider]  docusate sodium (COLACE) 100 MG capsule Take 1 capsule (100 mg total) by mouth 3 (three) times daily. 12/02/16  Yes [name], MD  ferrous gluconate (FERGON) 324 MG tablet Take 1 tablet (324 mg total) by mouth 2 (two) times daily with a meal. 12/02/16  Yes [name], MD  guaiFENesin (MUCINEX) 600 MG 12 hr tablet Take 600 mg by mouth 2 (two) times daily as needed for cough or to  loosen phlegm.   Yes [provider]  metoprolol succinate (TOPROL-XL) 25 MG 24 hr tablet TAKE 1/2 TABLET BY MOUTH 2 TIMES DAILY Patient taking differently: Take 12.5 mg by mouth 2 (two) times daily. TAKE 1/2 TABLET BY MOUTH 2 TIMES DAILY 07/22/16  Yes Nahser, Deloris Ping, MD  Multiple Vitamin (MULTIVITAMIN) tablet Take 1 tablet by mouth daily. Centrum Silver for Men   Yes [provider]  nitroGLYCERIN (NITROSTAT) 0.4 MG SL tablet Place 1 tablet (0.4 mg total) under the tongue every 5 (five) minutes as needed for chest pain. 07/01/16 12/05/16 Yes Bhagat, Bhavinkumar, PA  Omega-3 Fatty Acids (FISH OIL) 1200 MG CAPS Take 2,400 mg by mouth 2 (two) times daily at 10 AM and 5 PM.    Yes [provider]  ondansetron (ZOFRAN-ODT) 4 MG disintegrating tablet Take 1 tablet (4 mg total) by mouth every 6 (six) hours as needed for nausea. 12/01/16  Yes [name], MD  oxyCODONE (OXY IR/ROXICODONE) 5 MG immediate release tablet Take 1-2 tablets (5-10 mg total) by mouth every 4 (four) hours as needed for moderate pain. 12/01/16  Yes [name], MD  pantoprazole (PROTONIX) 40 MG tablet TAKE 1 TABLET BY MOUTH EVERY DAY AS NEEDED Patient taking differently: TAKE 40 MG BY MOUTH EVERY DAY AS NEEDED ACID REFLUX 09/28/16  Yes Jeffery, Chelle, PA-C  sodium chloride (OCEAN) 0.65 % SOLN nasal spray Place 1 spray into both nostrils as needed for congestion.   Yes [provider]  traMADol (ULTRAM) 50 MG tablet Take 1 tablet (50 mg total) by mouth every 6 (six) hours as needed (mild pain). 12/01/16  Yes [name], MD  tadalafil (CIALIS) 20 MG tablet Take 40 mg by mouth daily as needed for erectile dysfunction.    [provider]    Family History Family History  Problem Relation Age of Onset  . Heart disease Mother   . Hypertension Father   . Prostate cancer Brother   . CAD Brother     Social History Social History  Substance Use Topics  . Smoking status: Former Smoker     Packs/day: 2.50    Years: 36.00    Types: Cigarettes    Quit date: 08/22/2007  . Smokeless tobacco: Never Used  . Alcohol use 3.6 oz/week    6 Cans of beer per week     Allergies   Ace inhibitors and Ropinirole hcl   Review of Systems Review of Systems All other systems reviewed and are negative except that which was mentioned in HPI   Physical Exam Updated Vital Signs BP 130/75 (BP Location: Right Arm)   Pulse 78   Temp (!) 97.4 F (36.3 C)   Resp 16   Ht  (1.727 m)   Wt 95.3 kg (210 lb)  SpO2 100%   BMI 31.93 kg/m   Physical Exam  Constitutional: He is oriented to person, place, and time. He appears well-developed and well-nourished. No distress.  HENT:  Head: Normocephalic and atraumatic.  Moist mucous membranes  Eyes: Pupils are equal, round, and reactive to light. Conjunctivae are normal.  Neck: Neck supple.  Cardiovascular: Normal rate, regular rhythm and normal heart sounds.   No murmur heard. Pulmonary/Chest: Effort normal and breath sounds normal.  Abdominal: Soft. Bowel sounds are normal. He exhibits distension (mild). There is no tenderness.  Large ecchymosis L abdominal wall; multiple healing laparoscopic surgical sites with no drainage or redness  Musculoskeletal: He exhibits edema (mild BLE to ankles).  Neurological: He is alert and oriented to person, place, and time.  Normal gait, Fluent speech  Skin: Skin is warm and dry.  Psychiatric: He has a normal mood and affect. Judgment normal.  Nursing note and vitals reviewed.    ED Treatments / Results  Labs (all labs ordered are listed, but only abnormal results are displayed) Labs Reviewed  BASIC METABOLIC PANEL - Abnormal; Notable for the following:       Result Value   Sodium 131 (*)    Chloride 97 (*)    Glucose, Bld 126 (*)    Calcium 8.5 (*)    All other components within normal limits  CBC - Abnormal; Notable for the following:    RBC 2.95 (*)    Hemoglobin 8.3 (*)    HCT 25.5  (*)    All other components within normal limits  BRAIN NATRIURETIC PEPTIDE  HEPARIN LEVEL (UNFRACTIONATED)  HIV ANTIBODY (ROUTINE TESTING)  HEPARIN LEVEL (UNFRACTIONATED)  CBC  BASIC METABOLIC PANEL  I-STAT TROPONIN, ED    EKG  EKG Interpretation  Date/Time:  Saturday December 05 2016 10:29:13 EDT Ventricular Rate:  81 PR Interval:  174 QRS Duration: 100 QT Interval:  390 QTC Calculation: 453 R Axis:   -22 Text Interpretation:  Normal sinus rhythm Incomplete right bundle branch block Inferior infarct , age undetermined Abnormal ECG No significant change since last tracing Confirmed by Frederick Peers [id number]) on 12/05/2016 10:55:45 AM       Radiology Dg Chest 2 View  Result Date: 12/05/2016 CLINICAL DATA:  Shortness of breath and weakness. EXAM: CHEST  2 VIEW COMPARISON:  Chest x-ray dated in June 22, 2016. FINDINGS: The cardiomediastinal silhouette is normal in size. Normal pulmonary vascularity. Bibasilar atelectasis. No focal consolidation, pleural effusion, or pneumothorax. No acute osseous abnormality. IMPRESSION: Bibasilar atelectasis.  No active cardiopulmonary disease. Electronically Signed   By: Obie Dredge M.D.   On: 12/05/2016 10:54   Ct Angio Chest Pe W/cm &/or Wo Cm  Result Date: 12/05/2016 CLINICAL DATA:  Shortness of breath.  Recent surgery. EXAM: CT ANGIOGRAPHY CHEST WITH CONTRAST TECHNIQUE: Multidetector CT imaging of the chest was performed using the standard protocol during bolus administration of intravenous contrast. Multiplanar CT image reconstructions and MIPs were obtained to evaluate the vascular anatomy. CONTRAST:  100 mL Isovue 370 COMPARISON:  Chest radiograph 12/05/2016 FINDINGS: Cardiovascular: Contrast injection is sufficient to demonstrate satisfactory opacification of the pulmonary arteries to the segmental level. There is nonocclusive thrombus within the proximal apical and posterior segmental arteries of the right upper lobe. No other filling  defect. The main pulmonary artery is within normal limits for size. There is no CT evidence of acute right heart strain. The visualized aorta is normal. There is a normal variant aortic arch branching pattern with the brachiocephalic and  left common carotid arteries sharing a common origin. Heart size is normal, without pericardial effusion. Multifocal coronary artery calcification. Mediastinum/Nodes: No mediastinal, hilar or axillary lymphadenopathy. The visualized thyroid and thoracic esophageal course are unremarkable. Lungs/Pleura: Mild biapical emphysema. No pulmonary nodules or masses. No pleural effusion or pneumothorax. No focal airspace consolidation. No focal pleural abnormality. Upper Abdomen: Contrast bolus timing is not optimized for evaluation of the abdominal organs. Within this limitation, the visualized organs of the upper abdomen are normal. Musculoskeletal: No chest wall abnormality. No acute or significant osseous findings. Review of the MIP images confirms the above findings. IMPRESSION: 1. Small nonocclusive pulmonary embolus within the proximal apical and posterior segmental arteries of the right upper lobe. 2. No evidence of right heart strain. 3.  Emphysema (ICD10-J43.9).  Coronary artery atherosclerosis. Critical Value/emergent results were called by telephone at the time of interpretation on 12/05/2016 at 2:32 pm to Dr. Frederick Peers , who verbally acknowledged these results. Electronically Signed   By: Deatra Robinson M.D.   On: 12/05/2016 14:33    Procedures .Critical Care Performed by: Laurence Spates Authorized by: Laurence Spates   Critical care provider statement:    Critical care time (minutes):  30   Critical care time was exclusive of:  Separately billable procedures and treating other patients   Critical care was necessary to treat or prevent imminent or life-threatening deterioration of the following conditions:  Respiratory failure   Critical care was time  spent personally by me on the following activities:  Development of treatment plan with patient or surrogate, evaluation of patient's response to treatment, examination of patient, obtaining history from patient or surrogate, ordering and performing treatments and interventions, ordering and review of laboratory studies, ordering and review of radiographic studies, re-evaluation of patient's condition and review of old charts   (including critical care time)  Medications Ordered in ED Medications  heparin ADULT infusion 100 units/mL (25000 units/248mL sodium chloride 0.45%) (1,400 Units/hr Intravenous New Bag/Given 12/05/16 1534)  azelastine (ASTELIN) 0.1 % nasal spray 1 spray (not administered)  docusate sodium (COLACE) capsule 100 mg (not administered)  ferrous gluconate (FERGON) tablet 324 mg (not administered)  guaiFENesin (MUCINEX) 12 hr tablet 600 mg (not administered)  ondansetron (ZOFRAN-ODT) disintegrating tablet 4 mg (not administered)  oxyCODONE (Oxy IR/ROXICODONE) immediate release tablet 5-10 mg (not administered)  sodium chloride (OCEAN) 0.65 % nasal spray 1 spray (not administered)  traMADol (ULTRAM) tablet 50 mg (not administered)  nitroGLYCERIN (NITROSTAT) SL tablet 0.4 mg (not administered)  atorvastatin (LIPITOR) tablet 40 mg (not administered)  multivitamin with minerals tablet 1 tablet (not administered)  0.9 %  sodium chloride infusion (not administered)  acetaminophen (TYLENOL) tablet 650 mg (not administered)    Or  acetaminophen (TYLENOL) suppository 650 mg (not administered)  HYDROcodone-acetaminophen (NORCO/VICODIN) 5-325 MG per tablet 1-2 tablet (not administered)  ondansetron (ZOFRAN) tablet 4 mg (not administered)    Or  ondansetron (ZOFRAN) injection 4 mg (not administered)  senna-docusate (Senokot-S) tablet 1 tablet (not administered)  metoprolol succinate (TOPROL-XL) 24 hr tablet 12.5 mg (not administered)  pantoprazole (PROTONIX) EC tablet 40 mg (not  administered)  metoCLOPramide (REGLAN) injection 10 mg (not administered)  calcium carbonate (TUMS - dosed in mg elemental calcium) chewable tablet 400 mg of elemental calcium (400 mg of elemental calcium Oral Given 12/05/16 1144)  iopamidol (ISOVUE-370) 76 % injection (100 mLs  Contrast Given 12/05/16 1355)  heparin bolus via infusion 4,500 Units (4,500 Units Intravenous Bolus from Bag 12/05/16 1534)  Initial Impression / Assessment and Plan / ED Course  I have reviewed the triage vital signs and the nursing notes.  Pertinent labs & imaging results that were available during my care of the patient were reviewed by me and considered in my medical decision making (see chart for details).    PT 5d post-op from hernia repair p/w SOB. He was nontoxic on exam with reassuring vital signs. No respiratory distress. Surgical sites healing well. He denies any problems with bleeding. Lab work overall reassuring with normal BNP and troponin. Obtained CTA of chest because of recent surgery and concern for possible PE. CT does show small pulmonary embolus in right upper lobe, no evidence of right heart strain. Initiated heparin and discussed admission with Zacarias Pontes and Dr. Adela Glimpse, and pt admitted for further care.  Final Clinical Impressions(s) / ED Diagnoses   Final diagnoses:  Other acute pulmonary embolism without acute cor pulmonale [facility])    New Prescriptions Current Discharge Medication List       Godric Lavell, Ambrose Finland, MD 12/05/16 1726    Zinedine Ellner, Ambrose Finland, MD 12/05/16 1727

## 2016-12-05 NOTE — Progress Notes (Signed)
ANTICOAGULATION CONSULT NOTE - Initial Consult  Pharmacy Consult for Heparin Indication: pulmonary embolus  Allergies  Allergen Reactions  . Ace Inhibitors Rash  . Ropinirole Hcl Rash    Patient Measurements: Height:  (172.7 cm) Weight: 210 lb (95.3 kg) IBW/kg (Calculated) : 68.4 Heparin Dosing Weight: 93 kg  Vital Signs: Temp: 98 F (36.7 C) (09/15 1025) Temp Source: Oral (09/15 1025) BP: 142/75 (09/15 1245) Pulse Rate: 75 (09/15 1245)  Labs:  Recent Labs  12/05/16 1120  HGB 8.3*  HCT 25.5*  PLT 350  CREATININE 0.81    Estimated Creatinine Clearance: 107.3 mL/min (by C-G formula based on SCr of 0.81 mg/dL).   Medical History: Past Medical History:  Diagnosis Date  . Acute MI, inferior wall, initial episode of care [facility]) 2009  . Arthritis   . Coronary atherosclerosis of unspecified type of vessel, native or graft   . GERD (gastroesophageal reflux disease)   . High cholesterol   . Hypertension   . Seasonal allergies   . Sleep apnea    "I've never worn a mask" (09/17/2016)    Assessment: 61 year old male to begin heparin for PE  Goal of Therapy:  Heparin level 0.3-0.7 units/ml Monitor platelets by anticoagulation protocol: Yes   Plan:  Heparin 4500 units iv bolus x 1 Heparin 1400 units / hr Heparin level 6 hours after heparin starts Heparin level, CBC daily  Thank you Okey Regal, PharmD [phone]   12/05/2016,2:57 PM

## 2016-12-05 NOTE — Telephone Encounter (Signed)
Pt called complaining of difficulty breathing, was unable to sleep because of this issue.  He denies nausea/vomiting, but complains of belching and being "blown up."  He is having greenish/yellow loose bowel movements, but was having black bowel movements for several days.  I advised him to come to ED.  He may have ileus with bloating causing shortness of breath, but is at risk for a PE.  He also is at risk for being more anemic and having shortness of breath due to this.    I have communicated with charge nurse in ED and surgeon on call at [facility].

## 2016-12-05 NOTE — ED Notes (Signed)
Patient transported to X-ray 

## 2016-12-05 NOTE — Progress Notes (Addendum)
ANTICOAGULATION CONSULT NOTE - Follow Up Consult  Pharmacy Consult for Heparin Indication: pulmonary embolus  Allergies  Allergen Reactions  . Ace Inhibitors Rash  . Ropinirole Hcl Rash    Patient Measurements: Height:  (175.3 cm) Weight: 214 lb (97.1 kg) IBW/kg (Calculated) : 70.7 Heparin Dosing Weight: 93kg  Vital Signs: Temp: 97.8 F (36.6 C) (09/15 2158) Temp Source: Oral (09/15 2158) BP: 128/74 (09/15 2158) Pulse Rate: 83 (09/15 2158)  Labs:  Recent Labs  12/05/16 1120 12/05/16 2158  HGB 8.3*  --   HCT 25.5*  --   PLT 350  --   HEPARINUNFRC  --  0.19*  CREATININE 0.81  --     Estimated Creatinine Clearance: 110.1 mL/min (by C-G formula based on SCr of 0.81 mg/dL).   Medications:  Heparin @ 1400 units/hr  Assessment: 61yom started on heparin earlier today for small nonocclusive PE. Initial heparin level is below goal at 0.19. No issues with infusion.   Goal of Therapy:  Heparin level 0.3-0.7 units/ml Monitor platelets by anticoagulation protocol: Yes   Plan:  1) Increase heparin to 1600 units/hr 2) Follow up daily heparin level and CBC  Fredrik Rigger 12/05/2016,11:15 PM    ADDENDUM: Follow up heparin level has increased to 0.28 but still remains below goal at 0.28. Increase heparin further to 1750 units/hr and check another 6 hour level.  Fredrik Rigger 12/05/2016, 6:38 AM

## 2016-12-06 ENCOUNTER — Observation Stay (HOSPITAL_BASED_OUTPATIENT_CLINIC_OR_DEPARTMENT_OTHER): Payer: 59

## 2016-12-06 ENCOUNTER — Other Ambulatory Visit: Payer: Self-pay | Admitting: Cardiovascular Disease

## 2016-12-06 DIAGNOSIS — I2699 Other pulmonary embolism without acute cor pulmonale: Secondary | ICD-10-CM

## 2016-12-06 DIAGNOSIS — R609 Edema, unspecified: Secondary | ICD-10-CM

## 2016-12-06 DIAGNOSIS — E785 Hyperlipidemia, unspecified: Secondary | ICD-10-CM | POA: Diagnosis not present

## 2016-12-06 DIAGNOSIS — I2510 Atherosclerotic heart disease of native coronary artery without angina pectoris: Secondary | ICD-10-CM | POA: Diagnosis not present

## 2016-12-06 DIAGNOSIS — E871 Hypo-osmolality and hyponatremia: Secondary | ICD-10-CM | POA: Diagnosis not present

## 2016-12-06 DIAGNOSIS — K219 Gastro-esophageal reflux disease without esophagitis: Secondary | ICD-10-CM | POA: Diagnosis not present

## 2016-12-06 LAB — BASIC METABOLIC PANEL
Anion gap: 8 (ref 5–15)
BUN: 9 mg/dL (ref 6–20)
CHLORIDE: 100 mmol/L — AB (ref 101–111)
CO2: 25 mmol/L (ref 22–32)
Calcium: 8.4 mg/dL — ABNORMAL LOW (ref 8.9–10.3)
Creatinine, Ser: 0.72 mg/dL (ref 0.61–1.24)
GFR calc Af Amer: >60 mL/min (ref >60)
GFR calc non Af Amer: >60 mL/min (ref >60)
GLUCOSE: 120 mg/dL — AB (ref 65–99)
Potassium: 3.6 mmol/L (ref 3.5–5.1)
SODIUM: 133 mmol/L — AB (ref 135–145)

## 2016-12-06 LAB — CBC
HCT: 25.6 % — ABNORMAL LOW (ref 39.0–52.0)
HEMOGLOBIN: 8.2 g/dL — AB (ref 13.0–17.0)
MCH: 27.6 pg (ref 26.0–34.0)
MCHC: 32 g/dL (ref 30.0–36.0)
MCV: 86.2 fL (ref 78.0–100.0)
Platelets: 341 10*3/uL (ref 150–400)
RBC: 2.97 MIL/uL — ABNORMAL LOW (ref 4.22–5.81)
RDW: 14.8 % (ref 11.5–15.5)
WBC: 10.9 10*3/uL — ABNORMAL HIGH (ref 4.0–10.5)

## 2016-12-06 LAB — SODIUM, URINE, RANDOM: Sodium, Ur: 87 mmol/L

## 2016-12-06 LAB — ECHOCARDIOGRAM COMPLETE
HEIGHTINCHES: 69 in
Weight: 3424 oz

## 2016-12-06 LAB — HEPARIN LEVEL (UNFRACTIONATED): HEPARIN UNFRACTIONATED: 0.28 [IU]/mL — AB (ref 0.30–0.70)

## 2016-12-06 LAB — OSMOLALITY, URINE: Osmolality, Ur: 416 mOsm/kg (ref 300–900)

## 2016-12-06 LAB — CREATININE, URINE, RANDOM: Creatinine, Urine: 76.7 mg/dL

## 2016-12-06 LAB — HIV ANTIBODY (ROUTINE TESTING W REFLEX): HIV Screen 4th Generation wRfx: NONREACTIVE

## 2016-12-06 MED ORDER — SUCRALFATE 1 G PO TABS
1.0000 g | ORAL_TABLET | Freq: Three times a day (TID) | ORAL | Status: DC
  Administered 2016-12-06 – 2016-12-07 (×3): 1 g via ORAL
  Filled 2016-12-06 (×3): qty 1

## 2016-12-06 MED ORDER — HEPARIN (PORCINE) IN NACL 100-0.45 UNIT/ML-% IJ SOLN: 1750.0000 [IU]/h | INTRAMUSCULAR | Status: DC

## 2016-12-06 MED ORDER — PANTOPRAZOLE SODIUM 40 MG PO TBEC
40.0000 mg | DELAYED_RELEASE_TABLET | Freq: Two times a day (BID) | ORAL | Status: DC
  Administered 2016-12-06 – 2016-12-07 (×3): 40 mg via ORAL
  Filled 2016-12-06 (×3): qty 1

## 2016-12-06 MED ORDER — ENOXAPARIN SODIUM 100 MG/ML ~~LOC~~ SOLN
100.0000 mg | Freq: Two times a day (BID) | SUBCUTANEOUS | Status: DC
  Administered 2016-12-06 – 2016-12-07 (×2): 100 mg via SUBCUTANEOUS
  Filled 2016-12-06 (×2): qty 1

## 2016-12-06 NOTE — Progress Notes (Signed)
  Echocardiogram 2D Echocardiogram has been performed.  Pieter Partridge 12/06/2016, 12:45 PM

## 2016-12-06 NOTE — Progress Notes (Signed)
PROGRESS NOTE    ADMIR CANDELAS  OZH:[id number] DOB: Mar 11, 1956 DOA: 12/05/2016 PCP: Pearline Cables, MD    Brief Narrative:  61 year old male who presented with dyspnea. Patient is known to have significant medical history for coronary artery disease status post stent (2009), obstructive sleep apnea, hypertension, GERD and a recent ventral hernia repair. He underwent a ventral hernia repair, discharge on September 12, at home he complained of bilateral leg pain, associated with 24 hours of  dyspnea, worse with exertion and supine. On his initial physical exam blood pressure 130/75, temperature 97.4, heart rate 78, respirations 16, oxygen saturation 100%, moist mucous membranes, lungs were clear to auscultation bilaterally, no wheezing, rales or rhonchi, heart S1-S2 present and rhythmic, without murmurs, rubs or gallops, abdomen soft nontender, +2 lower extremity edema bilaterally. Sodium 131, potassium 4.1, chloride 97, bicarbonate 26, glucose 126, BUN 7, creatinine 0.81, white count 9.9, hemoglobin 8.3, hematocrit 25.5, platelets 350, chest x-ray, hypoinflated, right sided rotation, no effusions, infiltrates or pneumothorax. CTchest with bilateral ground glass opacities, right base atelectasis. Small nonocclusive pulmonary embolus within the proximal apical and posterior segment arteries on the right upper lobe. EKG sinus rhythm, chronic Q waves in lead 3 and aVF, chronic right bundle branch block.   Patient was admitted to the hospital with working diagnosis of acute pulmonary embolism complicated by hyponatremia.   Assessment & Plan:   Principal Problem:   Pulmonary embolism (HCC) Active Problems:   Hyperlipidemia   OBSTRUCTIVE SLEEP APNEA   CAD (coronary artery disease)   RESTLESS LEGS SYNDROME   Hyponatremia   Anemia   GERD (gastroesophageal reflux disease)   1.  Acute pulmonary embolism. Presumed provoked pulmonary embolism, related to recent surgery, will continue  anticoagulation with low molecular heparin, will follow on echocardiogram and lower extremity ultrasonography for risk stratification. Patient likely candidate for outpatient therapy with novel oral anticoagulants. Blood pressure stable. No signs of right heart strain per electrocardiography.   2. Hyponatremia. Improved Na at 133, will continue close follow up of electrolytes, po as tolerated, renal function is preserved with serum cr at 0,75. Will hold on further IV fluids for now. Follow renal function in am.   3. Dyslipidemia. Will continue statin therapy.  4. GERD. Patient with sever reflux symptoms, not able to lay supine, will add sucralfate qid with meals and bid proton pump inhibitor, keep head of the bed elevated.   5. Coronary artery disease. Will continue atorvastatin and clopidogrel. Chest pain free.   6. HTN. Will continue metoprolol with good toleration.    DVT prophylaxis: heparin  Code Status: Full Family Communication:  Disposition Plan: Home   Consultants:     Procedures:     Antimicrobials:       Subjective: Patient with no dyspnea, or cough, persistent lower extremity edema, no nausea or vomiting, no cough or hemoptysis.   Objective: Vitals:   12/05/16 1600 12/05/16 1646 12/05/16 2158 12/06/16 0549  BP: 122/77 130/75 128/74 140/76  Pulse:  78 83 79  Resp: Temp:  (!) 97.4 F (36.3 C) 97.8 F (36.6 C) 98.5 F (36.9 C)  TempSrc:   Oral Oral  SpO2:  100% 100% 99%  Weight:  97.1 kg (214 lb)    Height:   (1.753 m)      Intake/Output Summary (Last 24 hours) at 12/06/16 1109 Last data filed at 12/06/16 0938  Gross per 24 hour  Intake  976.95 ml  Output                0 ml  Net           976.95 ml   Filed Weights   12/05/16 1025 12/05/16 1646  Weight: 95.3 kg (210 lb) 97.1 kg (214 lb)    Examination:  General: deconditioned, not in pain Neurology: Awake and alert, non focal  E ENT: no pallor, no icterus, oral  mucosa moist Cardiovascular: No JVD. S1-S2 present, rhythmic, no gallops, rubs, or murmurs. Positive pitting lower extremity edema +/++. Pulmonary: vesicular breath sounds bilaterally, adequate air movement, no wheezing, rhonchi or rales. Gastrointestinal. Abdomen flat, no organomegaly, non tender, no rebound or guarding Skin. No rashes Musculoskeletal: no joint deformities     Data Reviewed: I have personally reviewed following labs and imaging studies  CBC:  Recent Labs Lab 12/01/16 0352 12/02/16 0454 12/02/16 1155 12/05/16 1120 12/06/16 0551  WBC 15.5* 10.1 10.3 9.9 10.9*  NEUTROABS  --  6.6 7.0  --   --   HGB 11.1* 8.2* 8.0* 8.3* 8.2*  HCT 34.7* 24.9* 25.0* 25.5* 25.6*  MCV 87.0 85.6 86.5 86.4 86.2  PLT 371 263 234 350 341   Basic Metabolic Panel:  Recent Labs Lab 12/01/16 0352 12/02/16 0454 12/05/16 1120 12/06/16 0551  NA 133* 134* 131* 133*  K 5.2* 4.6 4.1 3.6  CL 100* 105 97* 100*  CO2 GLUCOSE 177* 137* 126* 120*  BUN 23* CREATININE 1.66* 0.96 0.81 0.72  CALCIUM 8.3* 8.1* 8.5* 8.4*   GFR: Estimated Creatinine Clearance: 111.5 mL/min (by C-G formula based on SCr of 0.72 mg/dL). Liver Function Tests: No results for input(s): AST, ALT, ALKPHOS, BILITOT, PROT, ALBUMIN in the last 168 hours. No results for input(s): LIPASE, AMYLASE in the last 168 hours. No results for input(s): AMMONIA in the last 168 hours. Coagulation Profile: No results for input(s): INR, PROTIME in the last 168 hours. Cardiac Enzymes: No results for input(s): CKTOTAL, CKMB, CKMBINDEX, TROPONINI in the last 168 hours. BNP (last 3 results) No results for input(s): PROBNP in the last 8760 hours. HbA1C: No results for input(s): HGBA1C in the last 72 hours. CBG: No results for input(s): GLUCAP in the last 168 hours. Lipid Profile: No results for input(s): CHOL, HDL, LDLCALC, TRIG, CHOLHDL, LDLDIRECT in the last 72 hours. Thyroid Function Tests: No results for  input(s): TSH, T4TOTAL, FREET4, T3FREE, THYROIDAB in the last 72 hours. Anemia Panel: No results for input(s): VITAMINB12, FOLATE, FERRITIN, TIBC, IRON, RETICCTPCT in the last 72 hours.    Radiology Studies: I have reviewed all of the imaging during this hospital visit personally     Scheduled Meds: . atorvastatin  40 mg Oral Daily  . docusate sodium  100 mg Oral TID  . ferrous gluconate  324 mg Oral BID WC  . metoprolol succinate  12.5 mg Oral Daily  . multivitamin with minerals  1 tablet Oral Daily  . pantoprazole  40 mg Oral BID   Continuous Infusions: . sodium chloride 1,000 mL (12/05/16 1723)  . heparin 1,750 Units/hr (12/06/16 1610)     LOS: 0 days        Coralie Keens, MD [facility] Pager [phone]

## 2016-12-06 NOTE — Progress Notes (Signed)
ANTICOAGULATION CONSULT NOTE - Initial Consult  Pharmacy Consult for lovenox Indication: pulmonary embolus  Allergies  Allergen Reactions  . Ace Inhibitors Rash  . Ropinirole Hcl Rash    Patient Measurements: Height:  (175.3 cm) Weight: 214 lb (97.1 kg) IBW/kg (Calculated) : 70.7   Vital Signs: Temp: 98.5 F (36.9 C) (09/16 0549) Temp Source: Oral (09/16 0549) BP: 140/76 (09/16 0549) Pulse Rate: 79 (09/16 0549)  Labs:  Recent Labs  12/05/16 1120 12/05/16 2158 12/06/16 0551  HGB 8.3*  --  8.2*  HCT 25.5*  --  25.6*  PLT 350  --  341  HEPARINUNFRC  --  0.19* 0.28*  CREATININE 0.81  --  0.72    Estimated Creatinine Clearance: 111.5 mL/min (by C-G formula based on SCr of 0.72 mg/dL).   Medical History: Past Medical History:  Diagnosis Date  . Acute MI, inferior wall, initial episode of care [facility]) 2009  . Anemia   . Arthritis   . Coronary atherosclerosis of unspecified type of vessel, native or graft   . GERD (gastroesophageal reflux disease)   . High cholesterol   . Hypertension   . Seasonal allergies   . Sleep apnea    "I've never worn a mask" (09/17/2016)    Medications:  Infusions:  . sodium chloride 1,000 mL (12/05/16 1723)    Assessment: 61 YOM presenting with dyspnea found to have small nonocclusive PE and was started on heparin infusion, now transitioning to lovenox.  CBC low but stable, no bleeding reported or observed at this time.  Rn verified heparin gtt to be turned off 1 hour before lovenox administered.  Goal of Therapy:  Monitor platelets by anticoagulation protocol: Yes   Plan:  Start Lovenox  subq every 12 hours starting 1 hour after stoppage of heparin gtt Monitor daily CBC, s/s bleeding, clinical progress for PE  Daylene Posey M 12/06/2016,12:58 PM

## 2016-12-06 NOTE — Progress Notes (Signed)
VASCULAR LAB PRELIMINARY  PRELIMINARY  PRELIMINARY  PRELIMINARY  Bilateral lower extremity venous duplex  completed.    Preliminary report:  There is acute DVT noted in the left popliteal vein.  All other veins appear thrombus free.  Chris [name], RVT 12/06/2016, 12:30 PM

## 2016-12-07 ENCOUNTER — Other Ambulatory Visit: Payer: Self-pay | Admitting: Cardiovascular Disease

## 2016-12-07 DIAGNOSIS — K219 Gastro-esophageal reflux disease without esophagitis: Secondary | ICD-10-CM | POA: Diagnosis not present

## 2016-12-07 DIAGNOSIS — E871 Hypo-osmolality and hyponatremia: Secondary | ICD-10-CM | POA: Diagnosis not present

## 2016-12-07 DIAGNOSIS — I2510 Atherosclerotic heart disease of native coronary artery without angina pectoris: Secondary | ICD-10-CM

## 2016-12-07 DIAGNOSIS — I2699 Other pulmonary embolism without acute cor pulmonale: Secondary | ICD-10-CM | POA: Diagnosis not present

## 2016-12-07 LAB — BASIC METABOLIC PANEL
Anion gap: 7 (ref 5–15)
BUN: 8 mg/dL (ref 6–20)
CALCIUM: 8.4 mg/dL — AB (ref 8.9–10.3)
CO2: 26 mmol/L (ref 22–32)
Chloride: 104 mmol/L (ref 101–111)
Creatinine, Ser: 0.78 mg/dL (ref 0.61–1.24)
GFR calc Af Amer: >60 mL/min (ref >60)
GLUCOSE: 120 mg/dL — AB (ref 65–99)
Potassium: 3.6 mmol/L (ref 3.5–5.1)
Sodium: 137 mmol/L (ref 135–145)

## 2016-12-07 LAB — CBC
HCT: 25 % — ABNORMAL LOW (ref 39.0–52.0)
Hemoglobin: 7.8 g/dL — ABNORMAL LOW (ref 13.0–17.0)
MCH: 27 pg (ref 26.0–34.0)
MCHC: 31.2 g/dL (ref 30.0–36.0)
MCV: 86.5 fL (ref 78.0–100.0)
Platelets: 389 10*3/uL (ref 150–400)
RBC: 2.89 MIL/uL — ABNORMAL LOW (ref 4.22–5.81)
RDW: 14.9 % (ref 11.5–15.5)
WBC: 11.3 10*3/uL — ABNORMAL HIGH (ref 4.0–10.5)

## 2016-12-07 MED ORDER — RIVAROXABAN 15 MG PO TABS
15.0000 mg | ORAL_TABLET | Freq: Two times a day (BID) | ORAL | Status: DC
  Administered 2016-12-07: 15 mg via ORAL
  Filled 2016-12-07: qty 1

## 2016-12-07 MED ORDER — RIVAROXABAN (XARELTO) VTE STARTER PACK (15 & 20 MG): ORAL_TABLET | ORAL | 0 refills | Status: DC

## 2016-12-07 MED ORDER — RIVAROXABAN (XARELTO) EDUCATION KIT FOR DVT/PE PATIENTS: 1.0000 | PACK | Freq: Once | 0 refills | Status: AC

## 2016-12-07 MED ORDER — SUCRALFATE 1 G PO TABS
1.0000 g | ORAL_TABLET | Freq: Three times a day (TID) | ORAL | 0 refills | Status: DC
Start: 2016-12-07 — End: 2017-01-09

## 2016-12-07 MED ORDER — ASPIRIN EC 81 MG PO TBEC
81.0000 mg | DELAYED_RELEASE_TABLET | Freq: Every day | ORAL | Status: DC
  Administered 2016-12-07: 81 mg via ORAL
  Filled 2016-12-07: qty 1

## 2016-12-07 MED ORDER — RIVAROXABAN 15 MG PO TABS: 15.0000 mg | ORAL_TABLET | Freq: Two times a day (BID) | ORAL | Status: DC

## 2016-12-07 MED ORDER — PANTOPRAZOLE SODIUM 40 MG PO TBEC: 40.0000 mg | DELAYED_RELEASE_TABLET | Freq: Every day | ORAL | 0 refills | Status: DC

## 2016-12-07 NOTE — Progress Notes (Signed)
ANTICOAGULATION CONSULT NOTE - Initial Consult  Pharmacy Consult for Xarelto Indication: pulmonary embolus  Allergies  Allergen Reactions  . Ace Inhibitors Rash  . Ropinirole Hcl Rash    Patient Measurements: Height:  (175.3 cm) Weight: 214 lb (97.1 kg) IBW/kg (Calculated) : 70.7   Vital Signs: Temp: 98.6 F (37 C) (09/17 0520) Temp Source: Oral (09/17 0520) BP: 127/70 (09/17 0520) Pulse Rate: 80 (09/17 0520)  Labs:  Recent Labs  12/05/16 1120 12/05/16 2158 12/06/16 0551 12/07/16 0419  HGB 8.3*  --  8.2* 7.8*  HCT 25.5*  --  25.6* 25.0*  PLT 350  --  341 389  HEPARINUNFRC  --  0.19* 0.28*  --   CREATININE 0.81  --  0.72 0.78    Estimated Creatinine Clearance: 111.5 mL/min (by C-G formula based on SCr of 0.78 mg/dL).   Medical History: Past Medical History:  Diagnosis Date  . Acute MI, inferior wall, initial episode of care [facility]) 2009  . Anemia   . Arthritis   . Coronary atherosclerosis of unspecified type of vessel, native or graft   . GERD (gastroesophageal reflux disease)   . High cholesterol   . Hypertension   . Seasonal allergies   . Sleep apnea    "I've never worn a mask" (09/17/2016)    Medications:  Infusions:    Assessment: 61 yo M presenting with dyspnea found to have small nonocclusive PE.  Of note, pt was recently hospitalized for hernia repair.  Pt was started on heparin infusion >> lovenox >> xarelto.  CBC low but stable, no bleeding reported or observed at this time.  Last Lovenox dose at 0200 this AM.  Will start Xarelto at noon.  Goal of Therapy:  Monitor platelets by anticoagulation protocol: Yes   Plan:  Xarelto  PO BID x 21 days followed by Xarelto  daily with supper. Monitor daily CBC, s/s bleeding, clinical progress for PE Placed case mgmt consult to check for insurance coverage of Xarelto  [employer], [address].D., BCPS Clinical Pharmacist Pager: [phone] Clinical phone for 12/07/2016 from 8:30-4:00 is  [phone]. After 4pm, please call Main [facility] [phone]) for assistance. 12/07/2016 9:01 AM

## 2016-12-07 NOTE — Progress Notes (Signed)
Chris [name] to be D/C'd to home per MD order.  Discussed with the patient and all questions fully answered.  VSS, Skin clean, dry and intact without evidence of skin break down, no evidence of skin tears noted. IV catheter discontinued intact. Site without signs and symptoms of complications. Dressing and pressure applied.  An After Visit Summary was printed and given to the patient. Patient received prescriptions.  D/c education completed with patient/family including follow up instructions, medication list, d/c activities limitations if indicated, with other d/c instructions as indicated by MD - patient able to verbalize understanding, all questions fully answered.   Patient instructed to return to ED, call 911, or call MD for any changes in condition.   Patient escorted via WC, and D/C home via private auto.  Joellyn Haff Price 12/07/2016 11:24 AM

## 2016-12-07 NOTE — Progress Notes (Signed)
CM received consult: Benefits check - new Xarelto. Benefits check in process for Xarelto: 15 mg PO q12hr for 21 days with food, THEN 20 mg PO qDay. CM to provide pt with 30 day free card and  $ 10.00 copay card prior to d/c. CM to f/u with results of benefit check. Gae Gallop RN,BSN,CM [phone]

## 2016-12-07 NOTE — Telephone Encounter (Signed)
New message   Pt verbalized that he is calling for rn to go over his medications   so that he dont throw away anything that he dont need

## 2016-12-07 NOTE — Telephone Encounter (Signed)
Reviewed patient's medication list with him s/p hospital discharge. I answered questions to his satisfaction and he thanked me for my help.

## 2016-12-07 NOTE — Discharge Summary (Signed)
Physician Discharge Summary  DIONDRE PULIS ZOX:[id number] DOB: 1956-02-19 DOA: 12/05/2016  PCP: Darreld Mclean, MD  Admit date: 12/05/2016 Discharge date: 12/07/2016  Admitted From: Home Disposition:  Home  Recommendations for Outpatient Follow-up:  1. Follow up with PCP in 1- week 2. Patient has been placed on Rivaroxaban for anticoagulation  3. Patient will continue aspirin, clopidogrel has been discontinued 4. Added sucralfate and pantoprazole for acid reflux.   Home Health: No  Equipment/Devices: No   Discharge Condition: Stable CODE STATUS: Full  Diet recommendation: Cardiac prudent diet  Brief/Interim Summary: 61 year old male who presented with dyspnea. Patient is known to have significant medical history for coronary artery disease status post stent (2009), obstructive sleep apnea, hypertension, GERD and a recent ventral hernia repair.  Discharged on September 12, at home he complained of bilateral leg pain, associated with 24 hours of  dyspnea, worse with exertion and supine position. On his initial physical exam blood pressure 130/75, temperature 97.4, heart rate 78, respirations 16, oxygen saturation 100%, moist mucous membranes, lungs were clear to auscultation bilaterally, no wheezing, rales or rhonchi, heart S1-S2 present and rhythmic, without murmurs, rubs or gallops, abdomen soft nontender, positive bilateral lower quadrants ecchymosis, +2 lower extremity edema bilaterally. Sodium 131, potassium 4.1, chloride 97, bicarbonate 26, glucose 126, BUN 7, creatinine 0.81, white count 9.9, hemoglobin 8.3, hematocrit 25.5, platelets 350, chest x-ray, hypoinflated, right sided rotation, no effusions, infiltrates or pneumothorax. CTchest with bilateral ground glass opacities, right base atelectasis. Small nonocclusive pulmonary embolus within the proximal apical and posterior segment arteries on the right upper lobe. EKG sinus rhythm, chronic Q waves in lead 3 and aVF, chronic right  bundle branch block.   Patient was admitted to the hospital with working diagnosis of acute pulmonary embolism complicated by hyponatremia.  1. Acute pulmonary embolism, left popliteal vein deep vein thrombosis/ provoked deep vein thrombosis, likely related to recent surgical intervention. Patient was admitted to the hospital, placed on a remote telemetry monitor, he received IV unfractionated heparin, then transitioned to low molecular weight heparin with good toleration. Patient will be discharged on Rivaroxaban, with a close follow-up as an outpatient. Echocardiography with ejection fraction 60-65% on the left ventricle, normal wall motion. Preliminary report of lower extremity ultrasonography, positive for deep vein thrombosis in the left popliteal vein. Discharge oximetry saturation 98% on room air. Patient will continue aspirin but clopidogrel will be discontinued, to decrease risk of bleeding, discuss with Dr. Acie Fredrickson  cardiology.  2. Acute hyponatremia. Patient received normal saline intravenously with improvement of serum Na. Renal function preserved with a serum creatinine 0.78, discharge sodium 137.   3. GERD, severe. Patient had significant reflux symptoms, he was placed on proton pump inhibitors and sucralfate with significant improvement of symptoms. Will continue antiacid therapy as an outpatient.  4. Coronary artery disease. Patient remained chest pain-free, he had a coronary stent placement in 2009, drug-eluting, to the right coronary. Patient has been on clopidogrel and aspirin, patient will be discharged on monotherapy with low-dose aspirin, clopidogrel will be discontinued due to increased risk of bleeding in the setting of Rivaroxaban therapy. Continue atorvastatin.  5. Hypertension. Patient will continue taking metoprolol.  6. Recent ventral hernia repair. Patient will follow-up with the surgery clinic, he does have significant ecchymosis in the lower parts of the abdomen, will  need close follow-up as an outpatient  Discharge Diagnoses:  Principal Problem:   Pulmonary embolism ([place]) Active Problems:   Hyperlipidemia   OBSTRUCTIVE SLEEP APNEA   CAD (  coronary artery disease)   RESTLESS LEGS SYNDROME   Hyponatremia   Anemia   GERD (gastroesophageal reflux disease)    Discharge Instructions   Allergies as of 12/07/2016      Reactions   Ace Inhibitors Rash   Ropinirole Hcl Rash      Medication List    TAKE these medications   aspirin 81 MG tablet Take 81 mg by mouth daily.   atorvastatin 40 MG tablet Commonly known as:  LIPITOR Take 1 tablet (40 mg total) by mouth daily.   azelastine 0.1 % nasal spray Commonly known as:  ASTELIN Place 1 spray into both nostrils 2 (two) times daily as needed for rhinitis. Use in each nostril as directed   docusate sodium 100 MG capsule Commonly known as:  COLACE Take 1 capsule (100 mg total) by mouth 3 (three) times daily.   ferrous gluconate 324 MG tablet Commonly known as:  FERGON Take 1 tablet (324 mg total) by mouth 2 (two) times daily with a meal.   Fish Oil 1200 MG Caps Take 2,400 mg by mouth 2 (two) times daily at 10 AM and 5 PM.   guaiFENesin 600 MG 12 hr tablet Commonly known as:  MUCINEX Take 600 mg by mouth 2 (two) times daily as needed for cough or to loosen phlegm.   metoprolol succinate 25 MG 24 hr tablet Commonly known as:  TOPROL-XL TAKE 1/2 TABLET BY MOUTH 2 TIMES DAILY What changed:  how much to take  how to take this  when to take this  additional instructions   multivitamin tablet Take 1 tablet by mouth daily. Centrum Silver for Men   nitroGLYCERIN 0.4 MG SL tablet Commonly known as:  NITROSTAT Place 1 tablet (0.4 mg total) under the tongue every 5 (five) minutes as needed for chest pain.   ondansetron 4 MG disintegrating tablet Commonly known as:  ZOFRAN-ODT Take 1 tablet (4 mg total) by mouth every 6 (six) hours as needed for nausea.   oxyCODONE 5 MG immediate  release tablet Commonly known as:  Oxy IR/ROXICODONE Take 1-2 tablets (5-10 mg total) by mouth every 4 (four) hours as needed for moderate pain.   pantoprazole 40 MG tablet Commonly known as:  PROTONIX Take 1 tablet (40 mg total) by mouth daily. TAKE 40 MG BY MOUTH EVERY DAY AS NEEDED ACID REFLUX What changed:  See the new instructions.   Rivaroxaban 15 & 20 MG Tbpk Take as directed on package: Start with one 78m tablet by mouth twice a day with food. On Day 22, switch to one 217mtablet once a day with food.   rivaroxaban Kit Commonly known as:  XARELTO 1 kit by Does not apply route once.   sodium chloride 0.65 % Soln nasal spray Commonly known as:  OCEAN Place 1 spray into both nostrils as needed for congestion.   sucralfate 1 g tablet Commonly known as:  CARAFATE Take 1 tablet (1 g total) by mouth 4 (four) times daily -  with meals and at bedtime.   tadalafil 20 MG tablet Commonly known as:  CIALIS Take 40 mg by mouth daily as needed for erectile dysfunction.   traMADol 50 MG tablet Commonly known as:  ULTRAM Take 1 tablet (50 mg total) by mouth every 6 (six) hours as needed (mild pain).            Discharge Care Instructions        Start     Ordered   12/07/16 0000  sucralfate (CARAFATE) 1 g tablet  3 times daily with meals & bedtime     12/07/16 0930   12/07/16 0000  Increase activity slowly     12/07/16 0930   12/07/16 0000  Diet - low sodium heart healthy     12/07/16 0930   12/07/16 0000  Discharge instructions    Comments:  Please follow with primary care in 7 days. Please stop taking clopidogrel (Plavix).   12/07/16 0930   12/07/16 0000  pantoprazole (PROTONIX) 40 MG tablet  Daily     12/07/16 0930   12/07/16 0000  Rivaroxaban 15 & 20 MG TBPK     12/07/16 0930   12/07/16 0000  rivaroxaban (XARELTO) KIT   Once     12/07/16 0930      Allergies  Allergen Reactions  . Ace Inhibitors Rash  . Ropinirole Hcl Rash     Consultations:     Procedures/Studies: Dg Chest 2 View  Result Date: 12/05/2016 CLINICAL DATA:  Shortness of breath and weakness. EXAM: CHEST  2 VIEW COMPARISON:  Chest x-ray dated in June 22, 2016. FINDINGS: The cardiomediastinal silhouette is normal in size. Normal pulmonary vascularity. Bibasilar atelectasis. No focal consolidation, pleural effusion, or pneumothorax. No acute osseous abnormality. IMPRESSION: Bibasilar atelectasis.  No active cardiopulmonary disease. Electronically Signed   By: Titus Dubin M.D.   On: 12/05/2016 10:54   Ct Angio Chest Pe W/cm &/or Wo Cm  Result Date: 12/05/2016 CLINICAL DATA:  Shortness of breath.  Recent surgery. EXAM: CT ANGIOGRAPHY CHEST WITH CONTRAST TECHNIQUE: Multidetector CT imaging of the chest was performed using the standard protocol during bolus administration of intravenous contrast. Multiplanar CT image reconstructions and MIPs were obtained to evaluate the vascular anatomy. CONTRAST:  100 mL Isovue 370 COMPARISON:  Chest radiograph 12/05/2016 FINDINGS: Cardiovascular: Contrast injection is sufficient to demonstrate satisfactory opacification of the pulmonary arteries to the segmental level. There is nonocclusive thrombus within the proximal apical and posterior segmental arteries of the right upper lobe. No other filling defect. The main pulmonary artery is within normal limits for size. There is no CT evidence of acute right heart strain. The visualized aorta is normal. There is a normal variant aortic arch branching pattern with the brachiocephalic and left common carotid arteries sharing a common origin. Heart size is normal, without pericardial effusion. Multifocal coronary artery calcification. Mediastinum/Nodes: No mediastinal, hilar or axillary lymphadenopathy. The visualized thyroid and thoracic esophageal course are unremarkable. Lungs/Pleura: Mild biapical emphysema. No pulmonary nodules or masses. No pleural effusion or pneumothorax.  No focal airspace consolidation. No focal pleural abnormality. Upper Abdomen: Contrast bolus timing is not optimized for evaluation of the abdominal organs. Within this limitation, the visualized organs of the upper abdomen are normal. Musculoskeletal: No chest wall abnormality. No acute or significant osseous findings. Review of the MIP images confirms the above findings. IMPRESSION: 1. Small nonocclusive pulmonary embolus within the proximal apical and posterior segmental arteries of the right upper lobe. 2. No evidence of right heart strain. 3.  Emphysema (ICD10-J43.9).  Coronary artery atherosclerosis. Critical Value/emergent results were called by telephone at the time of interpretation on 12/05/2016 at 2:32 pm to Dr. Theotis Burrow , who verbally acknowledged these results. Electronically Signed   By: Ulyses Jarred M.D.   On: 12/05/2016 14:33       Subjective: Patient feeling better, reflux symptoms have improved, no chest pain or dyspnea, out of bed as tolerated.  Discharge Exam: Vitals:   12/07/16 0207 12/07/16 0520  BP: [name])  145/69 127/70  Pulse: 76 80  Resp: 18 20  Temp: 99.3 F (37.4 C) 98.6 F (37 C)  SpO2: (!) 89% 98%   Vitals:   12/06/16 1450 12/06/16 2123 12/07/16 0207 12/07/16 0520  BP: (!) 143/73 126/68 (!) 145/69 127/70  Pulse: 81 89 76 80  Resp: 18 18 18 20   Temp:  98 F (36.7 C) 99.3 F (37.4 C) 98.6 F (37 C)  TempSrc:  Oral Oral Oral  SpO2: 100% 100% (!) 89% 98%  Weight:      Height:        General: Pt is alert, awake, not in acute distress E ENT: no pallor or icterus Cardiovascular: RRR, S1/S2 +, no rubs, no gallops Respiratory: CTA bilaterally, no wheezing, no rhonchi Abdominal: Soft, NT, ND, bowel sounds +, significant ecchymosis at the lower abdomen.  Extremities: trace bilateral lower extremity edema, no cyanosis    The results of significant diagnostics from this hospitalization (including imaging, microbiology, ancillary and laboratory) are listed  below for reference.     Microbiology: No results found for this or any previous visit (from the past 240 hour(s)).   Labs: BNP (last 3 results)  Recent Labs  12/05/16 1100  BNP 49.7   Basic Metabolic Panel:  Recent Labs Lab 12/01/16 0352 12/02/16 0454 12/05/16 1120 12/06/16 0551 12/07/16 0419  NA 133* 134* 131* 133* 137  K 5.2* 4.6 4.1 3.6 3.6  CL 100* 105 97* 100* 104  CO2 23 25 26 25 26   GLUCOSE 177* 137* 126* 120* 120*  BUN 23* 18 7 9 8   CREATININE 1.66* 0.96 0.81 0.72 0.78  CALCIUM 8.3* 8.1* 8.5* 8.4* 8.4*   Liver Function Tests: No results for input(s): AST, ALT, ALKPHOS, BILITOT, PROT, ALBUMIN in the last 168 hours. No results for input(s): LIPASE, AMYLASE in the last 168 hours. No results for input(s): AMMONIA in the last 168 hours. CBC:  Recent Labs Lab 12/02/16 0454 12/02/16 1155 12/05/16 1120 12/06/16 0551 12/07/16 0419  WBC 10.1 10.3 9.9 10.9* 11.3*  NEUTROABS 6.6 7.0  --   --   --   HGB 8.2* 8.0* 8.3* 8.2* 7.8*  HCT 24.9* 25.0* 25.5* 25.6* 25.0*  MCV 85.6 86.5 86.4 86.2 86.5  PLT 263 234 350 341 389   Cardiac Enzymes: No results for input(s): CKTOTAL, CKMB, CKMBINDEX, TROPONINI in the last 168 hours. BNP: Invalid input(s): POCBNP CBG: No results for input(s): GLUCAP in the last 168 hours. D-Dimer No results for input(s): DDIMER in the last 72 hours. Hgb A1c No results for input(s): HGBA1C in the last 72 hours. Lipid Profile No results for input(s): CHOL, HDL, LDLCALC, TRIG, CHOLHDL, LDLDIRECT in the last 72 hours. Thyroid function studies No results for input(s): TSH, T4TOTAL, T3FREE, THYROIDAB in the last 72 hours.  Invalid input(s): FREET3 Anemia work up No results for input(s): VITAMINB12, FOLATE, FERRITIN, TIBC, IRON, RETICCTPCT in the last 72 hours. Urinalysis    Component Value Date/Time   COLORURINE YELLOW 09/16/2016 1818   APPEARANCEUR CLEAR 09/16/2016 1818   LABSPEC 1.029 09/16/2016 1818   PHURINE 7.0 09/16/2016 1818    GLUCOSEU NEGATIVE 09/16/2016 1818   HGBUR NEGATIVE 09/16/2016 1818   BILIRUBINUR NEGATIVE 09/16/2016 1818   BILIRUBINUR negative 09/16/2016 [place] 09/16/2016 1818   PROTEINUR NEGATIVE 09/16/2016 1818   UROBILINOGEN 0.2 09/16/2016 1017   NITRITE NEGATIVE 09/16/2016 1818   LEUKOCYTESUR NEGATIVE 09/16/2016 1818   Sepsis Labs Invalid input(s): PROCALCITONIN,  WBC,  LACTICIDVEN Microbiology No results  found for this or any previous visit (from the past 240 hour(s)).   Time coordinating discharge: 45 minutes  SIGNED:   Tawni Millers, MD  [facility] 12/07/2016, 9:04 AM Pager [phone]  If 7PM-7AM, please contact night-coverage [email or web address] Password [email or web address]

## 2016-12-08 ENCOUNTER — Telehealth: Payer: Self-pay | Admitting: Cardiovascular Disease

## 2016-12-08 NOTE — Telephone Encounter (Signed)
Pt asking about Protonix and if it was for his reflux - informed pt that he was correct. He was also asking about Xarelto.  States they were giving it to him at 11 in the hospital, and he wanted to make sure he could take it at breakfast and dinner for the first 21 days and then reduce it to once daily at supper.  Informed patient that he could. Pt thanks me for discussing medication questions with him.

## 2016-12-08 NOTE — Telephone Encounter (Signed)
New message    Pt is calling to ask about his medication. Please call.

## 2016-12-09 ENCOUNTER — Telehealth: Payer: Self-pay

## 2016-12-09 NOTE — Telephone Encounter (Signed)
Called patient to [facility] follow up an schedule appointment with Dr. Patsy Lager per discharge orders. Patient abruptly stated "Dr. Patsy Lager had nothing to do with this, ok?!" Thanked patient and hung up.

## 2016-12-10 ENCOUNTER — Telehealth: Payer: Self-pay | Admitting: Physician Assistant

## 2016-12-10 ENCOUNTER — Encounter: Payer: Self-pay | Admitting: Family Medicine

## 2016-12-10 ENCOUNTER — Ambulatory Visit (INDEPENDENT_AMBULATORY_CARE_PROVIDER_SITE_OTHER): Payer: 59 | Admitting: Family Medicine

## 2016-12-10 ENCOUNTER — Telehealth: Payer: Self-pay | Admitting: Surgery

## 2016-12-10 VITALS — BP 136/86 | HR 76 | Temp 98.0°F | Resp 16 | Ht 69.0 in | Wt 209.0 lb

## 2016-12-10 DIAGNOSIS — Z7901 Long term (current) use of anticoagulants: Secondary | ICD-10-CM

## 2016-12-10 DIAGNOSIS — I2699 Other pulmonary embolism without acute cor pulmonale: Secondary | ICD-10-CM | POA: Diagnosis not present

## 2016-12-10 DIAGNOSIS — R040 Epistaxis: Secondary | ICD-10-CM

## 2016-12-10 DIAGNOSIS — Z9889 Other specified postprocedural states: Secondary | ICD-10-CM

## 2016-12-10 DIAGNOSIS — Z8719 Personal history of other diseases of the digestive system: Secondary | ICD-10-CM

## 2016-12-10 MED ORDER — OXYMETAZOLINE HCL 0.05 % NA SOLN: 1.0000 | Freq: Two times a day (BID) | NASAL | 0 refills | Status: DC

## 2016-12-10 NOTE — Telephone Encounter (Signed)
Paged by answering service, patient has nose bleed with try to sneeze this morning. During my conversation, patient bleeding has stopped with ICE pack. He takes Xarelto for recent PE and DVT. Advised to continue Xarelto. Do not stop. May use packing for recurrent bleeding. If no control, call PCP or go to ER. Patient agree with plan.

## 2016-12-10 NOTE — Telephone Encounter (Signed)
Chris [name]  [birth year] [id number]  Patient Care Team: Copland, Gwenlyn Found, MD as PCP - General (Family Medicine) Nahser, Deloris Ping, MD as Consulting Physician (Cardiology) [name], MD as Consulting Physician (General Surgery)  This patient is a 61 y.o.male who calls today for surgical evaluation.   Date of procedure/visit: 11/30/2016   POST-OPERATIVE DIAGNOSIS:  SYMPTOMATIC VENTRAL INCISIONAL HERNIA  FINDINGS:  SB adherent to the anteriora abdominal wall.  Extensive hernia defect with Swiss cheese pattern, oval dimensions of 15 c 10 cm.Small bowel serosal tear.  PROCEDURE:  Procedure(s): LAPAROSCOPIC VENTRAL HERNIA REPAIR AND   REPAIR OF SMALL BOWEL SEROSAL TEAR INSERTION OF MESH 20 x 25 cm coated Ventral Lite mesh  Reason for call: NOSEBLEED  Patient called 6:43AM noting that he has had bleeding from his nose after blowing it.  Had [facility] repair 10 days ago.  H/p PE on chronic anticoagulation.  Switched to [employer] by Dr. ??? (he thought Dr Lindie Spruce)  I recommended holding all blood thinners.  Call PCP/ Cardiology & d/w them Ice to bridge of nose x 30 min Pinch nostrils on nose x 30 min  Go to ED if it dose not stop with above  Ardeth Sportsman, M.D., F.A.C.S. Gastrointestinal and Minimally Invasive Surgery [facility] [address] [place], [place] [address] [phone] Main / Paging    Patient Active Problem List   Diagnosis Date Noted  . Hyponatremia 12/05/2016  . Pulmonary embolism (HCC) 12/05/2016  . GERD (gastroesophageal reflux disease) 12/05/2016  . Anemia   . Ventral hernia without obstruction or gangrene 11/30/2016  . Acute appendicitis 09/16/2016  . Edentulous 06/11/2016  . Closed fracture of left lateral malleolus 11/25/2015  . Overweight 05/21/2014  . History of MI (myocardial infarction) 05/21/2014  . Unstable angina (HCC) 10/10/2012  . ED (erectile dysfunction) 07/25/2012  . RESTLESS LEGS SYNDROME 02/20/2010   . Hyperlipidemia 02/19/2010  . OBSTRUCTIVE SLEEP APNEA 02/19/2010  . CAD (coronary artery disease) 02/19/2010    Past Medical History:  Diagnosis Date  . Acute MI, inferior wall, initial episode of care [facility]) 2009  . Anemia   . Arthritis   . Coronary atherosclerosis of unspecified type of vessel, native or graft   . GERD (gastroesophageal reflux disease)   . High cholesterol   . Hypertension   . Seasonal allergies   . Sleep apnea    "I've never worn a mask" (09/17/2016)    Past Surgical History:  Procedure Laterality Date  . APPENDECTOMY    . CHOLECYSTECTOMY OPEN  ~ 2008   "it exploded"  . CORONARY ANGIOPLASTY WITH STENT PLACEMENT  2009    2.75 x 28 mm Promus stent to the RCA  . FRACTURE SURGERY    . HERNIA REPAIR    . INSERTION OF MESH N/A 11/30/2016   Procedure: INSERTION OF MESH;  Surgeon: [name], MD;  Location: [facility] OR;  Service: General;  Laterality: N/A;  . LAPAROSCOPIC APPENDECTOMY N/A 09/16/2016   Procedure: APPENDECTOMY LAPAROSCOPIC;  Surgeon: [name], MD;  Location: [facility] OR;  Service: General;  Laterality: N/A;  . LAPAROSCOPIC INCISIONAL / UMBILICAL / VENTRAL HERNIA REPAIR  11/30/2016   w/repair serosal tear  . ORIF ANKLE FRACTURE Left 11/27/2015   Procedure: OPEN REDUCTION INTERNAL FIXATION (ORIF) ANKLE FRACTURE;  Surgeon: [name], MD;  Location: [facility];  Service: Orthopedics;  Laterality: Left;  [name] VENTRAL HERNIA REPAIR N/A 11/30/2016   Procedure: LAPAROSCOPIC VENTRAL HERNIA REPAIR AND  REPAIR OF  Karlene Einstein;  Surgeon: [name], MD;  Location: [facility] OR;  Service: General;  Laterality: N/A;    Social History   Social History  . Marital status: Married    Spouse name: N/A  . Number of children: N/A  . 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 Smoking status: Former Smoker    Packs/day: 2.50    Years: 36.00    Types: Cigarettes    Quit date: 08/22/2007   . Smokeless tobacco: Never Used  . Alcohol use 3.6 oz/week    6 Cans of beer per week  . Drug use: No  . Sexual activity: Yes   Other Topics Concern  . Not on file   Social History Narrative   Lives with wife. Exercises occasionally.    Family History  Problem Relation Age of Onset  . Heart disease Mother   . Hypertension Father   . Prostate cancer Brother   . CAD Brother     Current Outpatient Prescriptions  Medication Sig Dispense Refill  . aspirin 81 MG tablet Take 81 mg by mouth daily.     [name] atorvastatin (LIPITOR) 40 MG tablet TAKE 1 TABLET (40 MG TOTAL) BY MOUTH DAILY. 30 tablet 11  . azelastine (ASTELIN) 0.1 % nasal spray Place 1 spray into both nostrils 2 (two) times daily as needed for rhinitis. Use in each nostril as directed    . docusate sodium (COLACE) 100 MG capsule Take 1 capsule (100 mg total) by mouth 3 (three) times daily. 20 capsule 0  . ferrous gluconate (FERGON) 324 MG tablet Take 1 tablet (324 mg total) by mouth 2 (two) times daily with a meal. 60 tablet 3  . guaiFENesin (MUCINEX) 600 MG 12 hr tablet Take 600 mg by mouth 2 (two) times daily as needed for cough or to loosen phlegm.    . metoprolol succinate (TOPROL-XL) 25 MG 24 hr tablet TAKE 1/2 TABLET BY MOUTH TWICE A DAY 90 tablet 0  . Multiple Vitamin (MULTIVITAMIN) tablet Take 1 tablet by mouth daily. Centrum Silver for Men    . nitroGLYCERIN (NITROSTAT) 0.4 MG SL tablet Place 1 tablet (0.4 mg total) under the tongue every 5 (five) minutes as needed for chest pain. 90 tablet 3  . Omega-3 Fatty Acids (FISH OIL) 1200 MG CAPS Take 2,400 mg by mouth 2 (two) times daily at 10 AM and 5 PM.     . ondansetron (ZOFRAN-ODT) 4 MG disintegrating tablet Take 1 tablet (4 mg total) by mouth every 6 (six) hours as needed for nausea. 20 tablet 0  . oxyCODONE (OXY IR/ROXICODONE) 5 MG immediate release tablet Take 1-2 tablets (5-10 mg total) by mouth every 4 (four) hours as needed for moderate pain. 30 tablet 0  .  pantoprazole (PROTONIX) 40 MG tablet Take 1 tablet (40 mg total) by mouth daily. TAKE 40 MG BY MOUTH EVERY DAY AS NEEDED ACID REFLUX 30 tablet 0  . Rivaroxaban 15 & 20 MG TBPK Take as directed on package: Start with one  tablet by mouth twice a day with food. On Day 22, switch to one  tablet once a day with food. 51 each 0  . sodium chloride (OCEAN) 0.65 % SOLN nasal spray Place 1 spray into both nostrils as needed for congestion.    . sucralfate (CARAFATE) 1 g tablet Take 1 tablet (1 g total) by mouth 4 (four) times daily -  with meals and at bedtime. 60 tablet 0  . tadalafil (CIALIS) 20 MG tablet Take 40 mg by mouth daily as needed for erectile dysfunction.    . traMADol (ULTRAM) 50 MG tablet Take 1 tablet (50 mg total) by mouth every 6 (six) hours as needed (mild pain). 30 tablet 0   No current facility-administered medications for this visit.      Allergies  Allergen Reactions  . Ace Inhibitors Rash  . Ropinirole Hcl Rash    @  Dg Chest 2 View  Result Date: 12/05/2016 CLINICAL DATA:  Shortness of breath and weakness. EXAM: CHEST  2 VIEW COMPARISON:  Chest x-ray dated in June 22, 2016. FINDINGS: The cardiomediastinal silhouette is normal in size. Normal pulmonary vascularity. Bibasilar atelectasis. No focal consolidation, pleural effusion, or pneumothorax. No acute osseous abnormality. IMPRESSION: Bibasilar atelectasis.  No active cardiopulmonary disease. Electronically Signed   By: Obie Dredge M.D.   On: 12/05/2016 10:54   Ct Angio Chest Pe W/cm &/or Wo Cm  Result Date: 12/05/2016 CLINICAL DATA:  Shortness of breath.  Recent surgery. EXAM: CT ANGIOGRAPHY CHEST WITH CONTRAST TECHNIQUE: Multidetector CT imaging of the chest was performed using the standard protocol during bolus administration of intravenous contrast. Multiplanar CT image reconstructions and MIPs were obtained to evaluate the vascular anatomy. CONTRAST:  100 mL Isovue 370 COMPARISON:  Chest radiograph  12/05/2016 FINDINGS: Cardiovascular: Contrast injection is sufficient to demonstrate satisfactory opacification of the pulmonary arteries to the segmental level. There is nonocclusive thrombus within the proximal apical and posterior segmental arteries of the right upper lobe. No other filling defect. The main pulmonary artery is within normal limits for size. There is no CT evidence of acute right heart strain. The visualized aorta is normal. There is a normal variant aortic arch branching pattern with the brachiocephalic and left common carotid arteries sharing a common origin. Heart size is normal, without pericardial effusion. Multifocal coronary artery calcification. Mediastinum/Nodes: No mediastinal, hilar or axillary lymphadenopathy. The visualized thyroid and thoracic esophageal course are unremarkable. Lungs/Pleura: Mild biapical emphysema. No pulmonary nodules or masses. No pleural effusion or pneumothorax. No focal airspace consolidation. No focal pleural abnormality. Upper Abdomen: Contrast bolus timing is not optimized for evaluation of the abdominal organs. Within this limitation, the visualized organs of the upper abdomen are normal. Musculoskeletal: No chest wall abnormality. No acute or significant osseous findings. Review of the MIP images confirms the above findings. IMPRESSION: 1. Small nonocclusive pulmonary embolus within the proximal apical and posterior segmental arteries of the right upper lobe. 2. No evidence of right heart strain. 3.  Emphysema (ICD10-J43.9).  Coronary artery atherosclerosis. Critical Value/emergent results were called by telephone at the time of interpretation on 12/05/2016 at 2:32 pm to Dr. Frederick Peers , who verbally acknowledged these results. Electronically Signed   By: Deatra Robinson M.D.   On: 12/05/2016 14:33    Note: This dictation was prepared with Dragon/digital dictation along with [employer]. Any transcriptional errors that result from this  process are unintentional.   .Ardeth Sportsman, M.D., F.A.C.S. Gastrointestinal and Minimally Invasive Surgery [facility] [address] [place] [address], [place] [address] [phone] Main / Paging  12/10/2016 7:34 AM

## 2016-12-10 NOTE — Progress Notes (Signed)
Chief Complaint  Patient presents with  . Epistaxis    onset: this morning, pt on blood thinner, just recently had hernia surgery.    HPI  Pt is s/p Ventral Hernia Repair He reports that his bowel sounds are normal He denies abdominal pain at present Reports that he feels bloated but is eating food  PE on anticoagulation He states that during the postop interval he was noted to have Pulmonary Embolism so he was stared on a blood thinner The blood thinner was started during his hospitalization Today he denies chest pain or shortness of breath He has nose bleeding but no chest pain, no bleeding gums, no hematuria, no melena or hematochezia  Epistaxis He reports that he sneezed this morning and had a nose bleed. He states that he called his surgeon who told him to come in to the office to get evaluation.  States that he applied ice and pressure    Past Medical History:  Diagnosis Date  . Acute MI, inferior wall, initial episode of care [facility]) 2009  . Anemia   . Arthritis   . Coronary atherosclerosis of unspecified type of vessel, native or graft   . GERD (gastroesophageal reflux disease)   . High cholesterol   . Hypertension   . Seasonal allergies   . Sleep apnea    "I've never worn a mask" (09/17/2016)    Current Outpatient Prescriptions  Medication Sig Dispense Refill  . aspirin 81 MG tablet Take 81 mg by mouth daily.     [name] atorvastatin (LIPITOR) 40 MG tablet TAKE 1 TABLET (40 MG TOTAL) BY MOUTH DAILY. 30 tablet 11  . azelastine (ASTELIN) 0.1 % nasal spray Place 1 spray into both nostrils 2 (two) times daily as needed for rhinitis. Use in each nostril as directed    . docusate sodium (COLACE) 100 MG capsule Take 1 capsule (100 mg total) by mouth 3 (three) times daily. 20 capsule 0  . ferrous gluconate (FERGON) 324 MG tablet Take 1 tablet (324 mg total) by mouth 2 (two) times daily with a meal. 60 tablet 3  . guaiFENesin (MUCINEX) 600 MG 12 hr tablet Take 600 mg by mouth 2  (two) times daily as needed for cough or to loosen phlegm.    . metoprolol succinate (TOPROL-XL) 25 MG 24 hr tablet TAKE 1/2 TABLET BY MOUTH TWICE A DAY 90 tablet 0  . Multiple Vitamin (MULTIVITAMIN) tablet Take 1 tablet by mouth daily. Centrum Silver for Men    . Omega-3 Fatty Acids (FISH OIL) 1200 MG CAPS Take 2,400 mg by mouth 2 (two) times daily at 10 AM and 5 PM.     . ondansetron (ZOFRAN-ODT) 4 MG disintegrating tablet Take 1 tablet (4 mg total) by mouth every 6 (six) hours as needed for nausea. 20 tablet 0  . oxyCODONE (OXY IR/ROXICODONE) 5 MG immediate release tablet Take 1-2 tablets (5-10 mg total) by mouth every 4 (four) hours as needed for moderate pain. 30 tablet 0  . pantoprazole (PROTONIX) 40 MG tablet Take 1 tablet (40 mg total) by mouth daily. TAKE 40 MG BY MOUTH EVERY DAY AS NEEDED ACID REFLUX 30 tablet 0  . Rivaroxaban 15 & 20 MG TBPK Take as directed on package: Start with one  tablet by mouth twice a day with food. On Day 22, switch to one  tablet once a day with food. 51 each 0  . sodium chloride (OCEAN) 0.65 % SOLN nasal spray Place 1 spray into both nostrils as  needed for congestion.    . sucralfate (CARAFATE) 1 g tablet Take 1 tablet (1 g total) by mouth 4 (four) times daily -  with meals and at bedtime. 60 tablet 0  . tadalafil (CIALIS) 20 MG tablet Take 40 mg by mouth daily as needed for erectile dysfunction.    . traMADol (ULTRAM) 50 MG tablet Take 1 tablet (50 mg total) by mouth every 6 (six) hours as needed (mild pain). 30 tablet 0  . nitroGLYCERIN (NITROSTAT) 0.4 MG SL tablet Place 1 tablet (0.4 mg total) under the tongue every 5 (five) minutes as needed for chest pain. 90 tablet 3  . oxymetazoline (AFRIN NASAL SPRAY) 0.05 % nasal spray Place 1 spray into both nostrils 2 (two) times daily. 30 mL 0   No current facility-administered medications for this visit.     Allergies:  Allergies  Allergen Reactions  . Ace Inhibitors Rash  . Ropinirole Hcl Rash     Past Surgical History:  Procedure Laterality Date  . APPENDECTOMY    . CHOLECYSTECTOMY OPEN  ~ 2008   "it exploded"  . CORONARY ANGIOPLASTY WITH STENT PLACEMENT  2009    2.75 x 28 mm Promus stent to the RCA  . FRACTURE SURGERY    . HERNIA REPAIR    . INSERTION OF MESH N/A 11/30/2016   Procedure: INSERTION OF MESH;  Surgeon: [name], MD;  Location: [facility] OR;  Service: General;  Laterality: N/A;  . LAPAROSCOPIC APPENDECTOMY N/A 09/16/2016   Procedure: APPENDECTOMY LAPAROSCOPIC;  Surgeon: [name], MD;  Location: [facility] OR;  Service: General;  Laterality: N/A;  . LAPAROSCOPIC INCISIONAL / UMBILICAL / VENTRAL HERNIA REPAIR  11/30/2016   w/repair serosal tear  . ORIF ANKLE FRACTURE Left 11/27/2015   Procedure: OPEN REDUCTION INTERNAL FIXATION (ORIF) ANKLE FRACTURE;  Surgeon: [name], MD;  Location: [facility];  Service: Orthopedics;  Laterality: Left;  [name] VENTRAL HERNIA REPAIR N/A 11/30/2016   Procedure: LAPAROSCOPIC VENTRAL HERNIA REPAIR AND  REPAIR OF SEROSAL TEAR;  Surgeon: [name], MD;  Location: [facility] OR;  Service: General;  Laterality: N/A;    Social History   Social History  . Marital status: Married    Spouse name: N/A  . Number of children: N/A  . 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 Smoking status: Former Smoker    Packs/day: 2.50    Years: 36.00    Types: Cigarettes    Quit date: 08/22/2007  . Smokeless tobacco: Never Used  . Alcohol use 3.6 oz/week    6 Cans of beer per week  . Drug use: No  . Sexual activity: Yes   Other Topics Concern  . None   Social History Narrative   Lives with wife. Exercises occasionally.    ROS See hpi Review of Systems See HPI Constitution: No fevers or chills No malaise No diaphoresis Skin: No rash or itching Eyes: no blurry vision, no double vision GU: no dysuria or hematuria Neuro: no dizziness or  headaches  Objective: Vitals:   12/10/16 0950  BP: 136/86  Pulse: 76  Resp: 16  Temp: 98 F (36.7 C)  TempSrc: Oral  SpO2: 97%  Weight: 209 lb (94.8 kg)  Height:  (1.753 m)    Physical Exam  Constitutional: He is oriented to person, place, and time. He appears well-developed and well-nourished.  HENT:  Head: Normocephalic and atraumatic.  Cardiovascular: Normal rate, regular rhythm and normal heart sounds.   Pulmonary/Chest: Effort normal and breath sounds normal. No respiratory distress. He has no wheezes.  Abdominal:    Neurological: He is alert and oriented to person, place, and time.   Right nare with 2 visible bleeding spots Slowly oozing  Assessment and Plan Timotheus was seen today for epistaxis.  Diagnoses and all orders for this visit:  Epistaxis Applied silver nitrate to the affected areas in the office Sent in afrin spray Continue ice and compression No need for rhinorocket  Current use of long term anticoagulation Other acute pulmonary embolism without acute cor pulmonale (HCC) Advised pt to continue blood thinner  S/P repair of ventral hernia Wounds and bowel sounds as expected Routine healing noted  Other orders -     Cancel: Flu Vaccine QUAD 36+ mos IM -     oxymetazoline (AFRIN NASAL SPRAY) 0.05 % nasal spray; Place 1 spray into both nostrils 2 (two) times daily.     Jojo Pehl A Audine Mangione

## 2016-12-10 NOTE — Patient Instructions (Addendum)
   IF you received an x-ray today, you will receive an invoice from [facility]. Please contact [facility] at [phone] with questions or concerns regarding your invoice.   IF you received labwork today, you will receive an invoice from [facility]. Please contact [facility] at [phone] with questions or concerns regarding your invoice.   Our billing staff will not be able to assist you with questions regarding bills from these companies.  You will be contacted with the lab results as soon as they are available. The fastest way to get your results is to activate your My Chart account. Instructions are located on the last page of this paperwork. If you have not heard from us regarding the results in 2 weeks, please contact this office.     Nosebleed, Adult A nosebleed is when blood comes out of the nose. Nosebleeds are common. Usually, they are not a sign of a serious condition. Nosebleeds can happen if a small blood vessel in your nose starts to bleed or if the lining of your nose (mucous membrane) cracks. They are commonly caused by:  Allergies.  Colds.  Picking your nose.  Blowing your nose too hard.  An injury from sticking an object into your nose or getting hit in the nose.  Dry or cold air.  Less common causes of nosebleeds include:  Toxic fumes.  Something abnormal in the nose or in the air-filled spaces in the bones of the face (sinuses).  Growths in the nose, such as polyps.  Medicines or conditions that cause blood to clot slowly.  Certain illnesses or procedures that irritate or dry out the nasal passages.  Follow these instructions at home: When you have a nosebleed:  Sit down and tilt your head slightly forward.  Use a clean towel or tissue to pinch your nostrils under the bony part of your nose. After 10 minutes, let go of your nose and see if bleeding starts again. Do not release pressure before that time. If there is still bleeding,  repeat the pinching and holding for 10 minutes until the bleeding stops.  Do not place tissues or gauze in the nose to stop bleeding.  Avoid lying down and avoid tilting your head backward. That may make blood collect in the throat and cause gagging or coughing.  Use a nasal spray decongestant to help with a nosebleed as told by your health care provider.  Do not use petroleum jelly or mineral oil in your nose. It can drip into your lungs. After a nosebleed:  Avoid blowing your nose or sniffing for a number of hours.  Avoid straining, lifting, or bending at the waist for several days. You may resume other normal activities as you are able.  Use saline spray or a humidifier as told by your health care provider.  Aspirinand blood thinners make bleeding more likely. If you are prescribed these medicines and you suffer from nosebleeds: ? Ask your health care provider if you should stop taking the medicines or if you should adjust the dose. ? Do not stop taking medicines that your health care provider has recommended unless told by your health care provider.  If your nosebleed was caused by dry mucous membranes, use over-the-counter saline nasal spray or gel. This will keep the mucous membranes moist and allow them to heal. If you must use a lubricant: ? Choose one that is water-soluble. ? Use only as much as you need and use it only as often as needed. ?   Do not lie down until several hours after you use it. Contact a health care provider if:  You have a fever.  You get nosebleeds often or more often than usual.  You bruise very easily.  You have a nosebleed from having something stuck in your nose.  You have bleeding in your mouth.  You vomit or cough up brown material.  You have a nosebleed after you start a new medicine. Get help right away if:  You have a nosebleed after a fall or a head injury.  Your nosebleed does not go away after 20 minutes.  You feel dizzy or  weak.  You have unusual bleeding from other parts of your body.  You have unusual bruising on other parts of your body.  You become sweaty.  You vomit blood. This information is not intended to replace advice given to you by your health care provider. Make sure you discuss any questions you have with your health care provider. Document Released: 12/17/2004 Document Revised: 11/07/2015 Document Reviewed: 09/24/2015 Elsevier Interactive Patient Education  2018 [employer]   

## 2016-12-14 ENCOUNTER — Encounter: Payer: Self-pay | Admitting: Family Medicine

## 2016-12-14 ENCOUNTER — Ambulatory Visit (INDEPENDENT_AMBULATORY_CARE_PROVIDER_SITE_OTHER): Payer: 59 | Admitting: Family Medicine

## 2016-12-14 VITALS — BP 132/64 | HR 77 | Temp 97.8°F | Resp 17 | Ht 69.0 in | Wt 209.2 lb

## 2016-12-14 DIAGNOSIS — Z7901 Long term (current) use of anticoagulants: Secondary | ICD-10-CM

## 2016-12-14 DIAGNOSIS — R040 Epistaxis: Secondary | ICD-10-CM | POA: Diagnosis not present

## 2016-12-14 MED ORDER — LIDOCAINE-EPINEPHRINE 1 %-1:100000 IJ SOLN
10.0000 mL | Freq: Once | INTRAMUSCULAR | Status: AC
  Administered 2016-12-14: 10 mL

## 2016-12-14 NOTE — Patient Instructions (Addendum)
   IF you received an x-ray today, you will receive an invoice from [facility]. Please contact [facility] at [phone] with questions or concerns regarding your invoice.   IF you received labwork today, you will receive an invoice from [facility]. Please contact [facility] at [phone] with questions or concerns regarding your invoice.   Our billing staff will not be able to assist you with questions regarding bills from these companies.  You will be contacted with the lab results as soon as they are available. The fastest way to get your results is to activate your My Chart account. Instructions are located on the last page of this paperwork. If you have not heard from us regarding the results in 2 weeks, please contact this office.     Nosebleed, Adult A nosebleed is when blood comes out of the nose. Nosebleeds are common. Usually, they are not a sign of a serious condition. Nosebleeds can happen if a small blood vessel in your nose starts to bleed or if the lining of your nose (mucous membrane) cracks. They are commonly caused by:  Allergies.  Colds.  Picking your nose.  Blowing your nose too hard.  An injury from sticking an object into your nose or getting hit in the nose.  Dry or cold air.  Less common causes of nosebleeds include:  Toxic fumes.  Something abnormal in the nose or in the air-filled spaces in the bones of the face (sinuses).  Growths in the nose, such as polyps.  Medicines or conditions that cause blood to clot slowly.  Certain illnesses or procedures that irritate or dry out the nasal passages.  Follow these instructions at home: When you have a nosebleed:  Sit down and tilt your head slightly forward.  Use a clean towel or tissue to pinch your nostrils under the bony part of your nose. After 10 minutes, let go of your nose and see if bleeding starts again. Do not release pressure before that time. If there is still bleeding,  repeat the pinching and holding for 10 minutes until the bleeding stops.  Do not place tissues or gauze in the nose to stop bleeding.  Avoid lying down and avoid tilting your head backward. That may make blood collect in the throat and cause gagging or coughing.  Use a nasal spray decongestant to help with a nosebleed as told by your health care provider.  Do not use petroleum jelly or mineral oil in your nose. It can drip into your lungs. After a nosebleed:  Avoid blowing your nose or sniffing for a number of hours.  Avoid straining, lifting, or bending at the waist for several days. You may resume other normal activities as you are able.  Use saline spray or a humidifier as told by your health care provider.  Aspirinand blood thinners make bleeding more likely. If you are prescribed these medicines and you suffer from nosebleeds: ? Ask your health care provider if you should stop taking the medicines or if you should adjust the dose. ? Do not stop taking medicines that your health care provider has recommended unless told by your health care provider.  If your nosebleed was caused by dry mucous membranes, use over-the-counter saline nasal spray or gel. This will keep the mucous membranes moist and allow them to heal. If you must use a lubricant: ? Choose one that is water-soluble. ? Use only as much as you need and use it only as often as needed. ?   Do not lie down until several hours after you use it. Contact a health care provider if:  You have a fever.  You get nosebleeds often or more often than usual.  You bruise very easily.  You have a nosebleed from having something stuck in your nose.  You have bleeding in your mouth.  You vomit or cough up brown material.  You have a nosebleed after you start a new medicine. Get help right away if:  You have a nosebleed after a fall or a head injury.  Your nosebleed does not go away after 20 minutes.  You feel dizzy or  weak.  You have unusual bleeding from other parts of your body.  You have unusual bruising on other parts of your body.  You become sweaty.  You vomit blood. This information is not intended to replace advice given to you by your health care provider. Make sure you discuss any questions you have with your health care provider. Document Released: 12/17/2004 Document Revised: 11/07/2015 Document Reviewed: 09/24/2015 Elsevier Interactive Patient Education  2018 Elsevier Inc.   

## 2016-12-14 NOTE — Progress Notes (Signed)
Chief Complaint  Patient presents with  . Epistaxis    onset: 1 1/2 hours ago while getting ready to go to MD office this morning    HPI  Pt is here to follow up for Epistaxis from 12/10/2016 He has bleeding on the right side of his nose since this morning  He put some tissues in his nose, ice and applied pressure He is on a blood thinner He came in after the bleeding continued to ooze over the last hour  Past Medical History:  Diagnosis Date  . Acute MI, inferior wall, initial episode of care [facility]) 2009  . Anemia   . Arthritis   . Coronary atherosclerosis of unspecified type of vessel, native or graft   . GERD (gastroesophageal reflux disease)   . High cholesterol   . Hypertension   . Seasonal allergies   . Sleep apnea    "I've never worn a mask" (09/17/2016)    Current Outpatient Prescriptions  Medication Sig Dispense Refill  . aspirin 81 MG tablet Take 81 mg by mouth daily.     [name] atorvastatin (LIPITOR) 40 MG tablet TAKE 1 TABLET (40 MG TOTAL) BY MOUTH DAILY. 30 tablet 11  . azelastine (ASTELIN) 0.1 % nasal spray Place 1 spray into both nostrils 2 (two) times daily as needed for rhinitis. Use in each nostril as directed    . docusate sodium (COLACE) 100 MG capsule Take 1 capsule (100 mg total) by mouth 3 (three) times daily. 20 capsule 0  . ferrous gluconate (FERGON) 324 MG tablet Take 1 tablet (324 mg total) by mouth 2 (two) times daily with a meal. 60 tablet 3  . guaiFENesin (MUCINEX) 600 MG 12 hr tablet Take 600 mg by mouth 2 (two) times daily as needed for cough or to loosen phlegm.    . metoprolol succinate (TOPROL-XL) 25 MG 24 hr tablet TAKE 1/2 TABLET BY MOUTH TWICE A DAY 90 tablet 0  . Multiple Vitamin (MULTIVITAMIN) tablet Take 1 tablet by mouth daily. Centrum Silver for Men    . Omega-3 Fatty Acids (FISH OIL) 1200 MG CAPS Take 2,400 mg by mouth 2 (two) times daily at 10 AM and 5 PM.     . ondansetron (ZOFRAN-ODT) 4 MG disintegrating tablet Take 1 tablet (4 mg total)  by mouth every 6 (six) hours as needed for nausea. 20 tablet 0  . oxyCODONE (OXY IR/ROXICODONE) 5 MG immediate release tablet Take 1-2 tablets (5-10 mg total) by mouth every 4 (four) hours as needed for moderate pain. 30 tablet 0  . oxymetazoline (AFRIN NASAL SPRAY) 0.05 % nasal spray Place 1 spray into both nostrils 2 (two) times daily. 30 mL 0  . pantoprazole (PROTONIX) 40 MG tablet Take 1 tablet (40 mg total) by mouth daily. TAKE 40 MG BY MOUTH EVERY DAY AS NEEDED ACID REFLUX 30 tablet 0  . Rivaroxaban 15 & 20 MG TBPK Take as directed on package: Start with one  tablet by mouth twice a day with food. On Day 22, switch to one  tablet once a day with food. 51 each 0  . sodium chloride (OCEAN) 0.65 % SOLN nasal spray Place 1 spray into both nostrils as needed for congestion.    . sucralfate (CARAFATE) 1 g tablet Take 1 tablet (1 g total) by mouth 4 (four) times daily -  with meals and at bedtime. 60 tablet 0  . tadalafil (CIALIS) 20 MG tablet Take 40 mg by mouth daily as needed for erectile dysfunction.    [name]  traMADol (ULTRAM) 50 MG tablet Take 1 tablet (50 mg total) by mouth every 6 (six) hours as needed (mild pain). 30 tablet 0  . nitroGLYCERIN (NITROSTAT) 0.4 MG SL tablet Place 1 tablet (0.4 mg total) under the tongue every 5 (five) minutes as needed for chest pain. 90 tablet 3   No current facility-administered medications for this visit.     Allergies:  Allergies  Allergen Reactions  . Ace Inhibitors Rash  . Ropinirole Hcl Rash    Past Surgical History:  Procedure Laterality Date  . APPENDECTOMY    . CHOLECYSTECTOMY OPEN  ~ 2008   "it exploded"  . CORONARY ANGIOPLASTY WITH STENT PLACEMENT  2009    2.75 x 28 mm Promus stent to the RCA  . FRACTURE SURGERY    . HERNIA REPAIR    . INSERTION OF MESH N/A 11/30/2016   Procedure: INSERTION OF MESH;  Surgeon: [name], MD;  Location: [facility] OR;  Service: General;  Laterality: N/A;  . LAPAROSCOPIC APPENDECTOMY N/A 09/16/2016    Procedure: APPENDECTOMY LAPAROSCOPIC;  Surgeon: [name], MD;  Location: [facility] OR;  Service: General;  Laterality: N/A;  . LAPAROSCOPIC INCISIONAL / UMBILICAL / VENTRAL HERNIA REPAIR  11/30/2016   w/repair serosal tear  . ORIF ANKLE FRACTURE Left 11/27/2015   Procedure: OPEN REDUCTION INTERNAL FIXATION (ORIF) ANKLE FRACTURE;  Surgeon: [name], MD;  Location: [facility];  Service: Orthopedics;  Laterality: Left;  [name] VENTRAL HERNIA REPAIR N/A 11/30/2016   Procedure: LAPAROSCOPIC VENTRAL HERNIA REPAIR AND  REPAIR OF SEROSAL TEAR;  Surgeon: [name], MD;  Location: [facility] OR;  Service: General;  Laterality: N/A;    Social History   Social History  . Marital status: Married    Spouse name: N/A  . Number of children: N/A  . 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 Smoking status: Former Smoker    Packs/day: 2.50    Years: 36.00    Types: Cigarettes    Quit date: 08/22/2007  . Smokeless tobacco: Never Used  . Alcohol use 3.6 oz/week    6 Cans of beer per week  . Drug use: No  . Sexual activity: Yes   Other Topics Concern  . None   Social History Narrative   Lives with wife. Exercises occasionally.    ROS  Objective: Vitals:   12/14/16 1057  BP: 132/64  Pulse: 77  Resp: 17  Temp: 97.8 F (36.6 C)  TempSrc: Oral  SpO2: 94%  Weight: 209 lb 3.2 oz (94.9 kg)  Height:  (1.753 m)    Physical Exam  Constitutional: He is oriented to person, place, and time. He appears well-developed and well-nourished.  HENT:  Head: Normocephalic and atraumatic.  Pulmonary/Chest: Effort normal.  Neurological: He is alert and oriented to person, place, and time.  Psychiatric: He has a normal mood and affect. His behavior is normal. Judgment and thought content normal.    Left nare examined A guaze soaked in lidocaine with epi was left in place Upon removal the area was inspected and there is  still 2-3 bleeding spots Proceeded to put a moist rhino rocket in place and inflated with 10cc  Assessment and Plan Finnigan was seen today for epistaxis.  Diagnoses and all orders for this visit:  Epistaxis- advised follow up with ENT after rhino rocket placed and pt stabilized -     lidocaine-EPINEPHrine (XYLOCAINE  W/EPI) 1 %-1:100000 (with pres) injection 10 mL; 10 mLs by Other route once. -     Cancel: Ambulatory referral to ENT -     Ambulatory referral to ENT  Anticoagulated- advised pt to stay on anticoagulation as the risk of stopping is greater -     Cancel: Ambulatory referral to ENT -     Ambulatory referral to ENT     Leliana Kontz A Creta Levin

## 2016-12-16 DIAGNOSIS — Z7901 Long term (current) use of anticoagulants: Secondary | ICD-10-CM | POA: Diagnosis not present

## 2016-12-16 DIAGNOSIS — J343 Hypertrophy of nasal turbinates: Secondary | ICD-10-CM | POA: Diagnosis not present

## 2016-12-16 DIAGNOSIS — R040 Epistaxis: Secondary | ICD-10-CM | POA: Insufficient documentation

## 2016-12-16 DIAGNOSIS — Z8719 Personal history of other diseases of the digestive system: Secondary | ICD-10-CM | POA: Diagnosis not present

## 2016-12-25 ENCOUNTER — Ambulatory Visit (INDEPENDENT_AMBULATORY_CARE_PROVIDER_SITE_OTHER): Payer: 59 | Admitting: Physician Assistant

## 2016-12-25 ENCOUNTER — Encounter: Payer: Self-pay | Admitting: Physician Assistant

## 2016-12-25 VITALS — BP 130/70 | HR 71 | Temp 98.2°F | Resp 16 | Ht 69.0 in | Wt 207.2 lb

## 2016-12-25 DIAGNOSIS — I2699 Other pulmonary embolism without acute cor pulmonale: Secondary | ICD-10-CM | POA: Diagnosis not present

## 2016-12-25 DIAGNOSIS — K219 Gastro-esophageal reflux disease without esophagitis: Secondary | ICD-10-CM | POA: Diagnosis not present

## 2016-12-25 DIAGNOSIS — I2609 Other pulmonary embolism with acute cor pulmonale: Secondary | ICD-10-CM | POA: Diagnosis not present

## 2016-12-25 DIAGNOSIS — Z23 Encounter for immunization: Secondary | ICD-10-CM

## 2016-12-25 DIAGNOSIS — I2782 Chronic pulmonary embolism: Secondary | ICD-10-CM | POA: Diagnosis not present

## 2016-12-25 LAB — POCT CBC
GRANULOCYTE PERCENT: 54.7 % (ref 37–80)
HEMATOCRIT: 35.7 % — AB (ref 43.5–53.7)
Hemoglobin: 11.6 g/dL — AB (ref 14.1–18.1)
Lymph, poc: 2.7 (ref 0.6–3.4)
MCH: 27 pg (ref 27–31.2)
MCHC: 32.5 g/dL (ref 31.8–35.4)
MCV: 83.1 fL (ref 80–97)
MID (CBC): 0.5 (ref 0–0.9)
MPV: 6.5 fL (ref 0–99.8)
PLATELET COUNT, POC: 637 10*3/uL — AB (ref 142–424)
POC Granulocyte: 3.9 (ref 2–6.9)
POC LYMPH %: 38.5 % (ref 10–50)
POC MID %: 6.8 % (ref 0–12)
RBC: 4.3 M/uL — AB (ref 4.69–6.13)
RDW, POC: 16.3 %
WBC: 7.1 10*3/uL (ref 4.6–10.2)

## 2016-12-25 MED ORDER — RIVAROXABAN 20 MG PO TABS: 20.0000 mg | ORAL_TABLET | Freq: Every day | ORAL | 1 refills | Status: DC

## 2016-12-25 NOTE — Progress Notes (Signed)
12/27/2016 12:07 PM   DOB: 06-06-1955 / MRN: [id number]  SUBJECTIVE:  Chris [name] is a 61 y.o. male presenting for follow up of pulmonary embolism.  Is taking his Xarelto as prescribed. He is taking ASA as instructed.  Tells me he has "throwed away his plavix" as instructed.  Is not taking any pain pills an says that he does not need them. .  Denies chest pain and shortness of breath today. Is seeing cardiology and plans to keep that appointment.  He had an ankle fracture roughly one year ago, had this surgically repaired and was doing well.  On 09/16/16 was found to have appendicitis and multiple ventral hernias on CT scan.  Had hernia repair surgey on 9/15 and this was uncomplicated until patient presented to ED on 915 with chest pain and SOB.   ED admission note as follows: 'PT 5d post-op from hernia repair p/w SOB. He was nontoxic on exam with reassuring vital signs. No respiratory distress. Surgical sites healing well. He denies any problems with bleeding. Lab work overall reassuring with normal BNP and troponin. Obtained CTA of chest because of recent surgery and concern for possible PE. CT does show small pulmonary embolus in right upper lobe, no evidence of right heart strain. Initiated heparin and discussed admission with Zacarias Pontes and Dr. Adela Glimpse, and pt admitted for further care'.     He is allergic to ace inhibitors and ropinirole hcl.   He  has a past medical history of Acute MI, inferior wall, initial episode of care [facility]) (2009); Anemia; Arthritis; Coronary atherosclerosis of unspecified type of vessel, native or graft; GERD (gastroesophageal reflux disease); High cholesterol; Hypertension; Seasonal allergies; and Sleep apnea.    He  reports that he quit smoking about 9 years ago. His smoking use included Cigarettes. He has a 90.00 pack-year smoking history. He has never used smokeless tobacco. He reports that he drinks about 3.6 oz of alcohol per week . He reports that he  does not use drugs. He  reports that he currently engages in sexual activity. The patient  has a past surgical history that includes ORIF ankle fracture (Left, 11/27/2015); laparoscopic appendectomy (N/A, 09/16/2016); Appendectomy; Cholecystectomy open (~ 2008); Fracture surgery; Coronary angioplasty with stent (2009); Laparoscopic incisional / umbilical / ventral hernia repair (11/30/2016); Hernia repair; Ventral hernia repair (N/A, 11/30/2016); and Insertion of mesh (N/A, 11/30/2016).  His family history includes CAD in his brother; Heart disease in his mother; Hypertension in his father; Prostate cancer in his brother.  Review of Systems  Constitutional: Negative for chills, diaphoresis and fever.  Respiratory: Negative for cough, hemoptysis, sputum production, shortness of breath and wheezing.   Cardiovascular: Negative for chest pain, orthopnea and leg swelling.  Gastrointestinal: Negative for abdominal pain, blood in stool, constipation, diarrhea, heartburn, melena, nausea and vomiting.  Genitourinary: Negative for flank pain.  Skin: Negative for rash.  Neurological: Negative for dizziness.    The problem list and medications were reviewed and updated by myself where necessary and exist elsewhere in the encounter.   OBJECTIVE:  BP 130/70   Pulse 71   Temp 98.2 F (36.8 C) (Oral)   Resp 16   Ht  (1.753 m)   Wt 207 lb 3.2 oz (94 kg)   SpO2 98%   BMI 30.60 kg/m   Physical Exam  Constitutional: He appears well-developed. He is active and cooperative.  Non-toxic appearance.  Cardiovascular: Normal rate, regular rhythm, S1 normal, S2 normal, normal heart sounds,  intact distal pulses and normal pulses.  Exam reveals no gallop and no friction rub.   No murmur heard. Pulmonary/Chest: Effort normal. No stridor. No tachypnea. No respiratory distress. He has no wheezes. He has no rales.  Abdominal: He exhibits no distension.  Musculoskeletal: He exhibits no edema or tenderness.    Neurological: He is alert.  Skin: Skin is warm and dry. He is not diaphoretic. No pallor.  Vitals reviewed.   Results for orders placed or performed in visit on 12/25/16 (from the past 72 hour(s))  Basic Metabolic Panel     Status: None   Collection Time: 12/25/16  5:18 PM  Result Value Ref Range   Glucose 90 65 - 99 mg/dL   BUN 10 8 - 27 mg/dL   Creatinine, Ser 6.29 0.76 - 1.27 mg/dL   GFR calc non Af Amer 89 >59 mL/min/1.73   GFR calc Af Amer 103 >59 mL/min/1.73   BUN/Creatinine Ratio 11 10 - 24   Sodium 143 134 - 144 mmol/L   Potassium 4.1 3.5 - 5.2 mmol/L   Chloride 102 96 - 106 mmol/L   CO2 26 20 - 29 mmol/L   Calcium 9.7 8.6 - 10.2 mg/dL  Protime-INR     Status: Abnormal   Collection Time: 12/25/16  5:18 PM  Result Value Ref Range   INR 1.2 0.8 - 1.2    Comment: Reference interval is for non-anticoagulated patients. Suggested INR therapeutic range for Vitamin K antagonist therapy:    Standard Dose (moderate intensity                   therapeutic range):       2.0 - 3.0    Higher intensity therapeutic range       2.5 - 3.5    Prothrombin Time 12.4 (H) 9.1 - 12.0 sec  POCT CBC     Status: Abnormal   Collection Time: 12/25/16  5:36 PM  Result Value Ref Range   WBC 7.1 4.6 - 10.2 K/uL   Lymph, poc 2.7 0.6 - 3.4   POC LYMPH PERCENT 38.5 10 - 50 %L   MID (cbc) 0.5 0 - 0.9   POC MID % 6.8 0 - 12 %M   POC Granulocyte 3.9 2 - 6.9   Granulocyte percent 54.7 37 - 80 %G   RBC 4.30 (A) 4.69 - 6.13 M/uL   Hemoglobin 11.6 (A) 14.1 - 18.1 g/dL   HCT, POC 52.8 (A) 41.3 - 53.7 %   MCV 83.1 80 - 97 fL   MCH, POC 27.0 27 - 31.2 pg   MCHC 32.5 31.8 - 35.4 g/dL   RDW, POC 24.4 %   Platelet Count, POC 637 (A) 142 - 424 K/uL   MPV 6.5 0 - 99.8 fL    No results found.  ASSESSMENT AND PLAN:  Trig was seen today for follow-up.  Diagnoses and all orders for this visit:  Other pulmonary embolism without acute cor pulmonale, unspecified chronicity (HCC) Comments: One Xarelto  at this time and patient is compliant. He will continue ASA per discharge. Will see him back in early January.  Orders: -     POCT CBC -     Basic Metabolic Panel -     Protime-INR -     rivaroxaban (XARELTO) 20 MG TABS tablet; Take 1 tablet (20 mg total) by mouth daily with supper. -     Protime-INR  Gastroesophageal reflux disease, esophagitis presence not specified: Med compliant.  No complaints from patient today regarding this problem.   Needs flu shot -     Flu Vaccine QUAD 36+ mos IM    The patient is advised to call or return to clinic if he does not see an improvement in symptoms, or to seek the care of the closest emergency department if he worsens with the above plan.   [name], MHS, PA-C [facility] at [facility] 12/27/2016 12:07 PM

## 2016-12-25 NOTE — Patient Instructions (Addendum)
  Continue all of your medications.  I would like to see you back in early December. Keep you cardiology appointment.    Kristeen Miss, MD  11/03/2016 9:18 AM    [facility] [address] [place],  [address] [place], [place]  [address] Pager [phone] Phone: [phone]; Fax: [phone]     IF you received an x-ray today, you will receive an invoice from [facility]. Please contact [facility] at [phone] with questions or concerns regarding your invoice.   IF you received labwork today, you will receive an invoice from [place]. Please contact [facility] at [phone] with questions or concerns regarding your invoice.   Our billing staff will not be able to assist you with questions regarding bills from these companies.  You will be contacted with the lab results as soon as they are available. The fastest way to get your results is to activate your My Chart account. Instructions are located on the last page of this paperwork. If you have not heard from [place] regarding the results in 2 weeks, please contact this office.

## 2016-12-26 LAB — BASIC METABOLIC PANEL
BUN / CREAT RATIO: 11 (ref 10–24)
BUN: 10 mg/dL (ref 8–27)
CHLORIDE: 102 mmol/L (ref 96–106)
CO2: 26 mmol/L (ref 20–29)
CREATININE: 0.92 mg/dL (ref 0.76–1.27)
Calcium: 9.7 mg/dL (ref 8.6–10.2)
GFR calc Af Amer: 103 mL/min/{1.73_m2} (ref >59)
GFR calc non Af Amer: 89 mL/min/{1.73_m2} (ref >59)
GLUCOSE: 90 mg/dL (ref 65–99)
Potassium: 4.1 mmol/L (ref 3.5–5.2)
SODIUM: 143 mmol/L (ref 134–144)

## 2016-12-26 LAB — PROTIME-INR
INR: 1.2 (ref 0.8–1.2)
PROTHROMBIN TIME: 12.4 s — AB (ref 9.1–12.0)

## 2016-12-29 ENCOUNTER — Telehealth: Payer: Self-pay | Admitting: Physician Assistant

## 2016-12-29 NOTE — Telephone Encounter (Signed)
Pt would like a CB from [employer]. He said he would know why? Please advise at [phone]

## 2017-01-05 NOTE — Telephone Encounter (Signed)
Called him back.  He was complaining of improving leg pain.  He is compliant with xarelto.  Advised that if he has any chest pain, SOB, new DOE, or does not seem to be improving then to RTC or go to the ED. [name], MS, PA-C 4:50 PM, 01/05/2017

## 2017-01-05 NOTE — Telephone Encounter (Signed)
Please advise 

## 2017-01-09 ENCOUNTER — Ambulatory Visit (INDEPENDENT_AMBULATORY_CARE_PROVIDER_SITE_OTHER): Payer: 59 | Admitting: Physician Assistant

## 2017-01-09 ENCOUNTER — Ambulatory Visit (INDEPENDENT_AMBULATORY_CARE_PROVIDER_SITE_OTHER): Payer: 59

## 2017-01-09 ENCOUNTER — Encounter: Payer: Self-pay | Admitting: Physician Assistant

## 2017-01-09 VITALS — BP 140/80 | HR 69 | Temp 98.7°F | Resp 17 | Ht 69.0 in | Wt 207.0 lb

## 2017-01-09 DIAGNOSIS — R198 Other specified symptoms and signs involving the digestive system and abdomen: Secondary | ICD-10-CM

## 2017-01-09 DIAGNOSIS — D509 Iron deficiency anemia, unspecified: Secondary | ICD-10-CM

## 2017-01-09 DIAGNOSIS — F458 Other somatoform disorders: Secondary | ICD-10-CM

## 2017-01-09 DIAGNOSIS — N529 Male erectile dysfunction, unspecified: Secondary | ICD-10-CM

## 2017-01-09 DIAGNOSIS — R221 Localized swelling, mass and lump, neck: Secondary | ICD-10-CM | POA: Diagnosis not present

## 2017-01-09 DIAGNOSIS — R0989 Other specified symptoms and signs involving the circulatory and respiratory systems: Secondary | ICD-10-CM

## 2017-01-09 DIAGNOSIS — R09A2 Foreign body sensation, throat: Secondary | ICD-10-CM

## 2017-01-09 LAB — POCT CBC
Granulocyte percent: 71.8 %G (ref 37–80)
HCT, POC: 40.1 % — AB (ref 43.5–53.7)
HEMOGLOBIN: 13.2 g/dL — AB (ref 14.1–18.1)
Lymph, poc: 1.4 (ref 0.6–3.4)
MCH: 27.3 pg (ref 27–31.2)
MCHC: 32.9 g/dL (ref 31.8–35.4)
MCV: 82.9 fL (ref 80–97)
MID (cbc): 0.7 (ref 0–0.9)
MPV: 7 fL (ref 0–99.8)
PLATELET COUNT, POC: 347 10*3/uL (ref 142–424)
POC Granulocyte: 5.3 (ref 2–6.9)
POC LYMPH PERCENT: 19 %L (ref 10–50)
POC MID %: 9.2 %M (ref 0–12)
RBC: 4.84 M/uL (ref 4.69–6.13)
RDW, POC: 16.8 %
WBC: 7.4 10*3/uL (ref 4.6–10.2)

## 2017-01-09 MED ORDER — LEVOCETIRIZINE DIHYDROCHLORIDE 5 MG PO TABS: 5.0000 mg | ORAL_TABLET | Freq: Every evening | ORAL | 0 refills | Status: DC

## 2017-01-09 MED ORDER — TADALAFIL 20 MG PO TABS: 40.0000 mg | ORAL_TABLET | Freq: Every day | ORAL | 0 refills | Status: DC | PRN

## 2017-01-09 NOTE — Progress Notes (Signed)
01/11/2017 8:37 AM   DOB: May 14, 1955 / MRN: [id number]  SUBJECTIVE:  Chris [name] is a 61 y.o. male presenting for globus sensation. Does complain of some sneezing at this time. Does use some saline solution and mupirocin in his nose. No mouth itching. Has a history of GERD and is taking Iron as prescribed.   Last A&P per cardiology is as follows on 06/22/16:   "Chest pain -Hx MI with stent to RCA 2009. Has been doing well since. CAD treated with BB, aspirin, Plavix and statin -Pt had a 1-2 hour episode of tightness across that chest, non-radiating and with no associated symptoms. Occurred after sweeping and resolved spontaneously. -First troponin negative. EKG without ischemic changes. CXR without abnormality -Advise to check one more troponin and if negative he can follow up as an out patient. -Continue beta blocker, plavix, aspirin, and statin -Provide SL NTG for home use."   He tells me he has never needed a nitroglycerine.    He is allergic to ace inhibitors and ropinirole hcl.   He  has a past medical history of Acute MI, inferior wall, initial episode of care [facility]) (2009); Anemia; Arthritis; Coronary atherosclerosis of unspecified type of vessel, native or graft; GERD (gastroesophageal reflux disease); High cholesterol; Hypertension; Seasonal allergies; and Sleep apnea.    He  reports that he quit smoking about 9 years ago. His smoking use included Cigarettes. He has a 90.00 pack-year smoking history. He has never used smokeless tobacco. He reports that he drinks about 3.6 oz of alcohol per week . He reports that he does not use drugs. He  reports that he currently engages in sexual activity. The patient  has a past surgical history that includes ORIF ankle fracture (Left, 11/27/2015); laparoscopic appendectomy (N/A, 09/16/2016); Appendectomy; Cholecystectomy open (~ 2008); Fracture surgery; Coronary angioplasty with stent (2009); Laparoscopic incisional / umbilical / ventral hernia  repair (11/30/2016); Hernia repair; Ventral hernia repair (N/A, 11/30/2016); and Insertion of mesh (N/A, 11/30/2016).  His family history includes CAD in his brother; Heart disease in his mother; Hypertension in his father; Prostate cancer in his brother.  Review of Systems  Constitutional: Negative for chills, diaphoresis and fever.  Eyes: Negative.   Respiratory: Negative for shortness of breath.   Cardiovascular: Negative for chest pain, orthopnea and leg swelling.  Gastrointestinal: Negative for abdominal pain, blood in stool, constipation, diarrhea, heartburn, melena, nausea and vomiting.  Genitourinary: Negative for flank pain.  Skin: Negative for rash.  Neurological: Negative for dizziness, sensory change, speech change, focal weakness and headaches.    The problem list and medications were reviewed and updated by myself where necessary and exist elsewhere in the encounter.   OBJECTIVE:  BP 140/80   Pulse 69   Temp 98.7 F (37.1 C) (Oral)   Resp 17   Ht 5\' 9"  (1.753 m)   Wt 207 lb (93.9 kg)   SpO2 98%   BMI 30.57 kg/m   Physical Exam  Constitutional: He appears well-developed. He is active and cooperative.  Non-toxic appearance.  Cardiovascular: Normal rate, regular rhythm, S1 normal, S2 normal, normal heart sounds, intact distal pulses and normal pulses.  Exam reveals no gallop and no friction rub.   No murmur heard. Pulmonary/Chest: Effort normal. No stridor. No tachypnea. No respiratory distress. He has no wheezes. He has no rales.  Abdominal: He exhibits no distension.  Musculoskeletal: He exhibits no edema.  Neurological: He is alert.  Skin: Skin is warm and dry. He is not  diaphoretic. No pallor.  Vitals reviewed.   CBC Latest Ref Rng & Units 01/09/2017 12/25/2016 12/07/2016  WBC 4.6 - 10.2 K/uL 7.4 7.1 11.3(H)  Hemoglobin 14.1 - 18.1 g/dL 13.2(A) 11.6(A) 7.8(L)  Hematocrit 43.5 - 53.7 % 40.1(A) 35.7(A) 25.0(L)  Platelets 150 - 400 K/uL - - 389     BP Readings  from Last 3 Encounters:  01/09/17 140/80  12/25/16 130/70  12/14/16 132/64     Results for orders placed or performed in visit on 01/09/17 (from the past 72 hour(s))  POCT CBC     Status: Abnormal   Collection Time: 01/09/17 12:51 PM  Result Value Ref Range   WBC 7.4 4.6 - 10.2 K/uL   Lymph, poc 1.4 0.6 - 3.4   POC LYMPH PERCENT 19.0 10 - 50 %L   MID (cbc) 0.7 0 - 0.9   POC MID % 9.2 0 - 12 %M   POC Granulocyte 5.3 2 - 6.9   Granulocyte percent 71.8 37 - 80 %G   RBC 4.84 4.69 - 6.13 M/uL   Hemoglobin 13.2 (A) 14.1 - 18.1 g/dL   HCT, POC 16.140.1 (A) 09.643.5 - 53.7 %   MCV 82.9 80 - 97 fL   MCH, POC 27.3 27 - 31.2 pg   MCHC 32.9 31.8 - 35.4 g/dL   RDW, POC 04.516.8 %   Platelet Count, POC 347 142 - 424 K/uL   MPV 7.0 0 - 99.8 fL    No results found.  ASSESSMENT AND PLAN:  [name] was seen today for follow-up.  Diagnoses and all orders for this visit:  Globus sensation: Taking a PPI and no gerd symptoms.History of allergies but had an episode of epistaxis so INCS not a good idea.  Will try him on histamin blockade.  -     DG Neck Soft Tissue; Future -     levocetirizine (XYZAL) 5 MG tablet; Take 1 tablet (5 mg total) by mouth every evening.  Iron deficiency anemia, unspecified iron deficiency anemia type -     POCT CBC  Erectile dysfunction, unspecified erectile dysfunction type -     tadalafil (CIALIS) 20 MG tablet; Take 2 tablets (40 mg total) by mouth daily as needed for erectile dysfunction. DO NOT MIX WITH NITROGLYCERIN    The patient is advised to call or return to clinic if he does not see an improvement in symptoms, or to seek the care of the closest emergency department if he worsens with the above plan.   Deliah BostonMichael Clark, MHS, PA-C [facility] at [facility] 01/11/2017 8:37 AM

## 2017-01-09 NOTE — Patient Instructions (Signed)
     IF you received an x-ray today, you will receive an invoice from [facility]. Please contact [facility] at [phone] with questions or concerns regarding your invoice.   IF you received labwork today, you will receive an invoice from [facility]. Please contact [facility] at [phone] with questions or concerns regarding your invoice.   Our billing staff will not be able to assist you with questions regarding bills from these companies.  You will be contacted with the lab results as soon as they are available. The fastest way to get your results is to activate your My Chart account. Instructions are located on the last page of this paperwork. If you have not heard from us regarding the results in 2 weeks, please contact this office.     

## 2017-01-27 ENCOUNTER — Ambulatory Visit: Payer: 59 | Admitting: Emergency Medicine

## 2017-01-27 ENCOUNTER — Ambulatory Visit (INDEPENDENT_AMBULATORY_CARE_PROVIDER_SITE_OTHER): Payer: 59

## 2017-01-27 ENCOUNTER — Encounter: Payer: Self-pay | Admitting: Emergency Medicine

## 2017-01-27 ENCOUNTER — Other Ambulatory Visit: Payer: Self-pay

## 2017-01-27 VITALS — BP 120/66 | HR 67 | Temp 98.7°F | Resp 16 | Ht 68.25 in | Wt 212.2 lb

## 2017-01-27 DIAGNOSIS — S93402A Sprain of unspecified ligament of left ankle, initial encounter: Secondary | ICD-10-CM | POA: Diagnosis not present

## 2017-01-27 DIAGNOSIS — M25572 Pain in left ankle and joints of left foot: Secondary | ICD-10-CM

## 2017-01-27 DIAGNOSIS — M25472 Effusion, left ankle: Secondary | ICD-10-CM | POA: Diagnosis not present

## 2017-01-27 NOTE — Patient Instructions (Addendum)
     IF you received an x-ray today, you will receive an invoice from [facility]. Please contact [facility] at [phone] with questions or concerns regarding your invoice.   IF you received labwork today, you will receive an invoice from [facility]. Please contact [facility] at [phone] with questions or concerns regarding your invoice.   Our billing staff will not be able to assist you with questions regarding bills from these companies.  You will be contacted with the lab results as soon as they are available. The fastest way to get your results is to activate your My Chart account. Instructions are located on the last page of this paperwork. If you have not heard from us regarding the results in 2 weeks, please contact this office.      Ankle Sprain An ankle sprain is a stretch or tear in one of the tough tissues (ligaments) in your ankle. Follow these instructions at home:  Rest your ankle.  Take over-the-counter and prescription medicines only as told by your doctor.  For 2-3 days, keep your ankle higher than the level of your heart (elevated) as much as possible.  If directed, put ice on the area: ? Put ice in a plastic bag. ? Place a towel between your skin and the bag. ? Leave the ice on for 20 minutes, 2-3 times a day.  If you were given a brace: ? Wear it as told. ? Take it off to shower or bathe. ? Try not to move your ankle much, but wiggle your toes from time to time. This helps to prevent swelling.  If you were given an elastic bandage (dressing): ? Take it off when you shower or bathe. ? Try not to move your ankle much, but wiggle your toes from time to time. This helps to prevent swelling. ? Adjust the bandage to make it more comfortable if it feels too tight. ? Loosen the bandage if you lose feeling in your foot, your foot tingles, or your foot gets cold and blue.  If you have crutches, use them as told by your doctor. Continue to use  them until you can walk without feeling pain in your ankle. Contact a doctor if:  Your bruises or swelling are quickly getting worse.  Your pain does not get better after you take medicine. Get help right away if:  You cannot feel your toes or foot.  Your toes or your foot looks blue.  You have very bad pain that gets worse. This information is not intended to replace advice given to you by your health care provider. Make sure you discuss any questions you have with your health care provider. Document Released: 08/26/2007 Document Revised: 08/15/2015 Document Reviewed: 10/09/2014 Elsevier Interactive Patient Education  2018 Elsevier Inc.  

## 2017-01-27 NOTE — Progress Notes (Signed)
Chris [name] 61 y.o.   Chief Complaint  Patient presents with  . Ankle Pain    LEFT x 2 days    HISTORY OF PRESENT ILLNESS: This is a 61 y.o. male complaining of left ankle pain and swelling x 2 days. Started after using treadmill. Has h/o DVT left popliteal complicated with small PE to right apex, on Xarelto.  HPI   Prior to Admission medications   Medication Sig Start Date End Date Taking? Authorizing Provider  aspirin 81 MG tablet Take 81 mg by mouth daily.    Yes [provider]  atorvastatin (LIPITOR) 40 MG tablet TAKE 1 TABLET (40 MG TOTAL) BY MOUTH DAILY. 12/08/16  Yes Nahser, Deloris Ping, MD  levocetirizine (XYZAL) 5 MG tablet Take 1 tablet (5 mg total) by mouth every evening. 01/09/17  Yes Ofilia Neas, PA-C  metoprolol succinate (TOPROL-XL) 25 MG 24 hr tablet TAKE 1/2 TABLET BY MOUTH TWICE A DAY 12/07/16  Yes Nahser, Deloris Ping, MD  Omega-3 Fatty Acids (FISH OIL) 1200 MG CAPS Take 2,400 mg by mouth 2 (two) times daily at 10 AM and 5 PM.    Yes [provider]  rivaroxaban (XARELTO) 20 MG TABS tablet Take 1 tablet (20 mg total) by mouth daily with supper. 12/25/16  Yes Ofilia Neas, PA-C  sodium chloride (OCEAN) 0.65 % SOLN nasal spray Place 1 spray into both nostrils as needed for congestion.   Yes [provider]  tadalafil (CIALIS) 20 MG tablet Take 2 tablets (40 mg total) by mouth daily as needed for erectile dysfunction. DO NOT MIX WITH NITROGLYCERIN 01/09/17  Yes Ofilia Neas, PA-C  nitroGLYCERIN (NITROSTAT) 0.4 MG SL tablet Place 1 tablet (0.4 mg total) under the tongue every 5 (five) minutes as needed for chest pain. 07/01/16 12/05/16  Bhagat, Sharrell Ku, PA  pantoprazole (PROTONIX) 40 MG tablet Take 1 tablet (40 mg total) by mouth daily. TAKE 40 MG BY MOUTH EVERY DAY AS NEEDED ACID REFLUX 12/07/16 01/06/17  Arrien, York Ram, MD  Rivaroxaban 15 & 20 MG TBPK Take as directed on package: Start with one 15mg  tablet by mouth twice a day  with food. On Day 22, switch to one 20mg  tablet once a day with food. Patient not taking: Reported on 01/27/2017 12/07/16   Arrien, York Ram, MD    Allergies  Allergen Reactions  . Ace Inhibitors Rash  . Ropinirole Hcl Rash    Patient Active Problem List   Diagnosis Date Noted  . Hyponatremia 12/05/2016  . Pulmonary embolism (HCC) 12/05/2016  . GERD (gastroesophageal reflux disease) 12/05/2016  . Anemia   . Ventral hernia without obstruction or gangrene 11/30/2016  . Acute appendicitis 09/16/2016  . Edentulous 06/11/2016  . Closed fracture of left lateral malleolus 11/25/2015  . Overweight 05/21/2014  . History of MI (myocardial infarction) 05/21/2014  . Unstable angina (HCC) 10/10/2012  . ED (erectile dysfunction) 07/25/2012  . RESTLESS LEGS SYNDROME 02/20/2010  . Hyperlipidemia 02/19/2010  . OBSTRUCTIVE SLEEP APNEA 02/19/2010  . CAD (coronary artery disease) 02/19/2010    Past Medical History:  Diagnosis Date  . Acute MI, inferior wall, initial episode of care [facility]) 2009  . Anemia   . Arthritis   . Coronary atherosclerosis of unspecified type of vessel, native or graft   . GERD (gastroesophageal reflux disease)   . High cholesterol   . Hypertension   . Seasonal allergies   . Sleep apnea    "I've never worn a mask" (09/17/2016)  Past Surgical History:  Procedure Laterality Date  . APPENDECTOMY    . CHOLECYSTECTOMY OPEN  ~ 2008   "it exploded"  . CORONARY ANGIOPLASTY WITH STENT PLACEMENT  2009    2.75 x 28 mm Promus stent to the RCA  . FRACTURE SURGERY    . HERNIA REPAIR    . LAPAROSCOPIC INCISIONAL / UMBILICAL / VENTRAL HERNIA REPAIR  11/30/2016   w/repair serosal tear    Social History   Socioeconomic History  . Marital status: Married    Spouse name: Not on file  . Number of children: Not on file  . Years of education: Not on file  . Highest education level: Not on file  Social Needs  . Financial resource strain: Not on file  . Food  insecurity - worry: Not on file  . Food insecurity - inability: Not on file  . Transportation needs - medical: Not on file  . Transportation needs - non-medical: Not on file  Occupational History  . Occupation: Production designer, theatre/television/filmmanager - Engineer, [employer]  Tobacco Use  . Smoking status: Former Smoker    Packs/day: 2.50    Years: 36.00    Pack years: 90.00    Types: Cigarettes    Last attempt to quit: 08/22/2007    Years since quitting: 9.4  . Smokeless tobacco: Never Used  Substance and Sexual Activity  . Alcohol use: Yes    Alcohol/week: 3.6 oz    Types: 6 Cans of beer per week  . Drug use: No  . Sexual activity: Yes  Other Topics Concern  . Not on file  Social History Narrative   Lives with wife. Exercises occasionally.    Family History  Problem Relation Age of Onset  . Heart disease Mother   . Hypertension Father   . Prostate cancer Brother   . CAD Brother      Review of Systems  Constitutional: Negative.  Negative for chills and fever.  Respiratory: Negative for cough and shortness of breath.   Cardiovascular: Negative for chest pain, palpitations and claudication.  Gastrointestinal: Negative for abdominal pain, diarrhea, nausea and vomiting.  Genitourinary: Negative for hematuria.  Skin: Negative for rash.  Neurological: Negative.   Endo/Heme/Allergies: Negative.   All other systems reviewed and are negative.  Vitals:   01/27/17 1159  BP: 120/66  Pulse: 67  Resp: 16  Temp: 98.7 F (37.1 C)  SpO2: 96%     Physical Exam  Constitutional: He appears well-developed and well-nourished.  HENT:  Head: Normocephalic and atraumatic.  Eyes: Conjunctivae and EOM are normal. Pupils are equal, round, and reactive to light.  Neck: Normal range of motion. Neck supple.  Cardiovascular: Normal rate and regular rhythm.  Pulmonary/Chest: Effort normal and breath sounds normal.  Musculoskeletal:  Left ankle: mild swelling; FROM; no tenderness. No calf tenderness.  Vitals  reviewed.  Dg Ankle Complete Left  Result Date: 01/27/2017 CLINICAL DATA:  Acute onset left ankle pain.  No known injury. EXAM: LEFT ANKLE COMPLETE - 3+ VIEW COMPARISON:  Plain films of the left ankle 11/08/2015. FINDINGS: No acute bony abnormality is identified. No evidence of arthropathy. Plate and screws fix a well-healed distal fibular fracture. Buttons along the distal fibula and medial malleolus for syndesmosis repair noted. Hardware is intact without complicating feature. IMPRESSION: No acute abnormality. Electronically Signed   By: Drusilla Kannerhomas  Dalessio M.D.   On: 01/27/2017 12:33    ASSESSMENT & PLAN: Bobby was seen today for ankle pain.  Diagnoses and all orders for  this visit:  Acute left ankle pain -     DG Ankle Complete Left; Future  Left ankle swelling  Sprain of left ankle, unspecified ligament, initial encounter    Patient Instructions       IF you received an x-ray today, you will receive an invoice from [facility]. Please contact [facility] at [phone] with questions or concerns regarding your invoice.   IF you received labwork today, you will receive an invoice from [facility]. Please contact [facility] at [phone] with questions or concerns regarding your invoice.   Our billing staff will not be able to assist you with questions regarding bills from these companies.  You will be contacted with the lab results as soon as they are available. The fastest way to get your results is to activate your My Chart account. Instructions are located on the last page of this paperwork. If you have not heard from us regarding the results in 2 weeks, please contact this office.     Ankle Sprain An ankle sprain is a stretch or tear in one of the tough tissues (ligaments) in your ankle. Follow these instructions at home:  Rest your ankle.  Take over-the-counter and prescription medicines only as told by your doctor.  For 2-3 days, keep your ankle  higher than the level of your heart (elevated) as much as possible.  If directed, put ice on the area: ? Put ice in a plastic bag. ? Place a towel between your skin and the bag. ? Leave the ice on for 20 minutes, 2-3 times a day.  If you were given a brace: ? Wear it as told. ? Take it off to shower or bathe. ? Try not to move your ankle much, but wiggle your toes from time to time. This helps to prevent swelling.  If you were given an elastic bandage (dressing): ? Take it off when you shower or bathe. ? Try not to move your ankle much, but wiggle your toes from time to time. This helps to prevent swelling. ? Adjust the bandage to make it more comfortable if it feels too tight. ? Loosen the bandage if you lose feeling in your foot, your foot tingles, or your foot gets cold and blue.  If you have crutches, use them as told by your doctor. Continue to use them until you can walk without feeling pain in your ankle. Contact a doctor if:  Your bruises or swelling are quickly getting worse.  Your pain does not get better after you take medicine. Get help right away if:  You cannot feel your toes or foot.  Your toes or your foot looks blue.  You have very bad pain that gets worse. This information is not intended to replace advice given to you by your health care provider. Make sure you discuss any questions you have with your health care provider. Document Released: 08/26/2007 Document Revised: 08/15/2015 Document Reviewed: 10/09/2014 Elsevier Interactive Patient Education  2018 Elsevier Inc.      Edwina BarthMiguel Daran Favaro, MD [facility]

## 2017-02-05 ENCOUNTER — Other Ambulatory Visit: Payer: Self-pay | Admitting: Physician Assistant

## 2017-02-05 DIAGNOSIS — R198 Other specified symptoms and signs involving the digestive system and abdomen: Secondary | ICD-10-CM

## 2017-02-05 DIAGNOSIS — R0989 Other specified symptoms and signs involving the circulatory and respiratory systems: Secondary | ICD-10-CM

## 2017-02-26 ENCOUNTER — Ambulatory Visit: Payer: Self-pay | Admitting: Physician Assistant

## 2017-02-26 ENCOUNTER — Other Ambulatory Visit: Payer: Self-pay | Admitting: Physician Assistant

## 2017-02-26 DIAGNOSIS — I2699 Other pulmonary embolism without acute cor pulmonale: Secondary | ICD-10-CM

## 2017-02-27 NOTE — Telephone Encounter (Signed)
Copied from CRM [id number]. Topic: Inquiry >> Feb 26, 2017  5:39 PM Chris [name], Chris [name]: Reason for CRM: pt asked for Dr. Chestine Sporelark to give him a call, gave no reason on what the call would be about, PT only stated that he would understand

## 2017-03-01 ENCOUNTER — Ambulatory Visit: Payer: Self-pay | Admitting: Physician Assistant

## 2017-03-04 ENCOUNTER — Other Ambulatory Visit: Payer: Self-pay

## 2017-03-04 ENCOUNTER — Encounter: Payer: Self-pay | Admitting: Physician Assistant

## 2017-03-04 ENCOUNTER — Ambulatory Visit (INDEPENDENT_AMBULATORY_CARE_PROVIDER_SITE_OTHER): Payer: 59 | Admitting: Physician Assistant

## 2017-03-04 VITALS — BP 142/70 | HR 66 | Temp 97.8°F | Resp 16 | Ht 68.25 in | Wt 219.0 lb

## 2017-03-04 DIAGNOSIS — I824Z2 Acute embolism and thrombosis of unspecified deep veins of left distal lower extremity: Secondary | ICD-10-CM

## 2017-03-04 DIAGNOSIS — I2699 Other pulmonary embolism without acute cor pulmonale: Secondary | ICD-10-CM

## 2017-03-04 MED ORDER — RIVAROXABAN 20 MG PO TABS: 20.0000 mg | ORAL_TABLET | Freq: Every day | ORAL | 1 refills | Status: DC

## 2017-03-04 NOTE — Progress Notes (Addendum)
03/04/2017 9:02 AM   DOB: [birth year] / MRN: [id number]  SUBJECTIVE:  Chris [name] is a 61 y.o. male presenting for refills of xarelto. Presented to ED with SOB and chest pain in mid September 2018 roughly 3 days post left ankle surgery.  Was found to have a PE and too days later found to have a LLE DVT.  Was placed on xarelto and has do well so far.    Immunization History  Administered Date(s) Administered  . Influenza,inj,Quad PF,6+ Mos 04/03/2013, 12/25/2016  . Influenza-Unspecified 02/20/2014  . Pneumococcal Conjugate-13 02/20/2014  . Tdap 03/23/2005, 08/12/2015     He is allergic to ace inhibitors and ropinirole hcl.   He  has a past medical history of Acute MI, inferior wall, initial episode of care [facility](HCC) (2009), Anemia, Arthritis, Coronary atherosclerosis of unspecified type of vessel, native or graft, GERD (gastroesophageal reflux disease), High cholesterol, Hypertension, Seasonal allergies, and Sleep apnea.    He  reports that he quit smoking about 9 years ago. His smoking use included cigarettes. He has a 90.00 pack-year smoking history. he has never used smokeless tobacco. He reports that he drinks about 3.6 oz of alcohol per week. He reports that he does not use drugs. He  reports that he currently engages in sexual activity. The patient  has a past surgical history that includes ORIF ankle fracture (Left, 11/27/2015); laparoscopic appendectomy (N/A, 09/16/2016); Appendectomy; Cholecystectomy open (~ 2008); Fracture surgery; Coronary angioplasty with stent (2009); Laparoscopic incisional / umbilical / ventral hernia repair (11/30/2016); Hernia repair; Ventral hernia repair (N/A, 11/30/2016); and Insertion of mesh (N/A, 11/30/2016).  His family history includes CAD in his brother; Heart disease in his mother; Hypertension in his father; Prostate cancer in his brother.  Review of Systems  Constitutional: Negative for chills, diaphoresis and fever.  Respiratory: Negative for  cough, hemoptysis, sputum production, shortness of breath and wheezing.   Cardiovascular: Negative for chest pain, orthopnea and leg swelling.  Gastrointestinal: Negative for nausea.  Skin: Negative for rash.  Neurological: Negative for dizziness.    The problem list and medications were reviewed and updated by myself where necessary and exist elsewhere in the encounter.   OBJECTIVE:  BP (!) 142/70 (BP Location: Right Arm, Patient Position: Sitting, Cuff Size: Large)   Pulse 66   Temp 97.8 F (36.6 C) (Oral)   Resp 16   Ht 5' 8.25" (1.734 m)   Wt 219 lb (99.3 kg)   SpO2 99%   BMI 33.06 kg/m   Physical Exam  Constitutional: He appears well-developed. He is active and cooperative.  Non-toxic appearance.  Cardiovascular: Normal rate, regular rhythm, S1 normal, S2 normal, normal heart sounds, intact distal pulses and normal pulses. Exam reveals no gallop and no friction rub.  No murmur heard. Pulmonary/Chest: Effort normal. No stridor. No tachypnea. No respiratory distress. He has no wheezes. He has no rales.  Abdominal: He exhibits no distension.  Musculoskeletal: He exhibits no edema.  Neurological: He is alert.  Skin: Skin is warm and dry. He is not diaphoretic. No pallor.  Vitals reviewed.  Lab Results  Component Value Date   WBC 7.4 01/09/2017   HGB 13.2 (A) 01/09/2017   HCT 40.1 (A) 01/09/2017   MCV 82.9 01/09/2017   PLT 389 12/07/2016    Lab Results  Component Value Date   CREATININE 0.92 12/25/2016   BUN 10 12/25/2016   NA 143 12/25/2016   K 4.1 12/25/2016   CL 102 12/25/2016   CO2  26 12/25/2016    Lab Results  Component Value Date   ALT 30 11/03/2016   AST 22 11/03/2016   ALKPHOS 91 11/03/2016   BILITOT 0.4 11/03/2016    Lab Results  Component Value Date   TSH 1.796 03/13/2015    Lab Results  Component Value Date   HGBA1C 6.1 (H) 11/19/2014    Lab Results  Component Value Date   CHOL 141 11/03/2016   HDL 37 (L) 11/03/2016   LDLCALC 60  11/03/2016   TRIG 220 (H) 11/03/2016   CHOLHDL 3.8 11/03/2016     No results found for this or any previous visit (from the past 72 hour(s)).  No results found.  ASSESSMENT AND PLAN:  Chris [name] was seen today for follow-up.  Diagnoses and all orders for this visit:  Deep vein thrombosis (DVT) of distal vein of left lower extremity, unspecified chronicity (HCC)  Other pulmonary embolism without acute cor pulmonale, unspecified chronicity (HCC) Comments: One Xarelto at this time and patient is compliant. He will continue ASA per discharge.  Orders: -     rivaroxaban (XARELTO) 20 MG TABS tablet; Take 1 tablet (20 mg total) by mouth daily with supper.  Other orders -     CT ANGIO CHEST PE W OR WO CONTRAST; Future -     VAS US LOWER EXTREMITY VENOUS (DVT); Future    The patient is advised to call or return to clinic if he does not see an improvement in symptoms, or to seek the care of the closest emergency department if he worsens with the above plan.   Deliah BostonMichael Clark, MHS, PA-C [facility] 03/04/2017 9:02 AM

## 2017-03-04 NOTE — Patient Instructions (Addendum)
Expect a call from our office setting up the scan on your leg and on your lungs in April.     IF you received an x-ray today, you will receive an invoice from [facility]. Please contact [facility] at [phone] with questions or concerns regarding your invoice.   IF you received labwork today, you will receive an invoice from [facility]. Please contact [facility] at [phone] with questions or concerns regarding your invoice.   Our billing staff will not be able to assist you with questions regarding bills from these companies.  You will be contacted with the lab results as soon as they are available. The fastest way to get your results is to activate your My Chart account. Instructions are located on the last page of this paperwork. If you have not heard from us regarding the results in 2 weeks, please contact this office.

## 2017-03-06 ENCOUNTER — Other Ambulatory Visit: Payer: Self-pay | Admitting: Cardiovascular Disease

## 2017-03-08 NOTE — Progress Notes (Signed)
That was a typo.  It was 2018 as the overall chart indicates.  He had an operation in the fall and this led to the blot clot in his leg that traveled to his lungs and thus the pulmonary embolism. Deliah BostonMichael Clark, MS, PA-C 3:08 PM, 03/08/2017

## 2017-03-18 ENCOUNTER — Telehealth: Payer: Self-pay | Admitting: Physician Assistant

## 2017-03-18 ENCOUNTER — Other Ambulatory Visit: Payer: Self-pay | Admitting: Physician Assistant

## 2017-03-18 DIAGNOSIS — R12 Heartburn: Secondary | ICD-10-CM

## 2017-03-18 NOTE — Telephone Encounter (Signed)
Checked prior auth status for pt's CT Angio Chest PE. It is still under review and [employer] is asking that we fax clinicals to them at [phone]. In order to fax these, we will need to add an addendum to the OV note from 03/04/17 stating that pt went to ED in mid Sept 2018 rather than 2015. I spoke with Casimiro NeedleMichael and he stated this should be 2018. For insurance purposes, the correct date will need to be added into the note. Please advise. Will send note when edited to proceed in getting pt's CT authorized. Thanks so much! Case #[id number].

## 2017-03-24 ENCOUNTER — Ambulatory Visit: Payer: 59 | Admitting: Physician Assistant

## 2017-03-24 ENCOUNTER — Other Ambulatory Visit: Payer: Self-pay

## 2017-03-24 ENCOUNTER — Encounter: Payer: Self-pay | Admitting: Physician Assistant

## 2017-03-24 VITALS — BP 144/81 | HR 70 | Temp 98.2°F | Resp 16 | Ht 68.25 in | Wt 218.2 lb

## 2017-03-24 DIAGNOSIS — B9789 Other viral agents as the cause of diseases classified elsewhere: Secondary | ICD-10-CM | POA: Diagnosis not present

## 2017-03-24 DIAGNOSIS — I10 Essential (primary) hypertension: Secondary | ICD-10-CM | POA: Diagnosis not present

## 2017-03-24 DIAGNOSIS — J069 Acute upper respiratory infection, unspecified: Secondary | ICD-10-CM

## 2017-03-24 MED ORDER — CHLORTHALIDONE 25 MG PO TABS
12.5000 mg | ORAL_TABLET | Freq: Every day | ORAL | 1 refills | Status: DC
Start: 2017-03-24 — End: 2017-05-12

## 2017-03-24 MED ORDER — CHLORPHENIRAMINE MALEATE 4 MG PO TABS: 4.0000 mg | ORAL_TABLET | Freq: Two times a day (BID) | ORAL | 0 refills | Status: DC | PRN

## 2017-03-24 NOTE — Patient Instructions (Signed)
     IF you received an x-ray today, you will receive an invoice from [facility]. Please contact  Radiology at [phone] with questions or concerns regarding your invoice.   IF you received labwork today, you will receive an invoice from [facility]. Please contact [facility] at [phone] with questions or concerns regarding your invoice.   Our billing staff will not be able to assist you with questions regarding bills from these companies.  You will be contacted with the lab results as soon as they are available. The fastest way to get your results is to activate your My Chart account. Instructions are located on the last page of this paperwork. If you have not heard from us regarding the results in 2 weeks, please contact this office.     

## 2017-03-24 NOTE — Progress Notes (Signed)
    03/24/2017 4:46 PM   DOB: 02-Jun-1955 / MRN: [id number]  SUBJECTIVE:  Chris [name] is a 62 y.o. male who complains of congestion, sore throat, nasal blockage, dry cough, headache and itching in eyes for 4 days. He denies a history of fatigue and denies a history of asthma. Patient does not smoke cigarettes.   OBJECTIVE: He appears well, vital signs are as noted. Ears normal.  Throat and pharynx normal.  Neck supple. No adenopathy in the neck. Nose is congested. Sinuses non tender. The chest is clear, without wheezes or rales.  Vitals:   03/24/17 1620  BP: (!) 144/81  Pulse: 70  Resp: 16  Temp: 98.2 F (36.8 C)  SpO2: 97%     ASSESSMENT:  viral upper respiratory illness    PLAN: Adding a diuretic in the setting of high blood pressure, leg swelling, and a history of CAD.  Will see him back for this in about three months. Symptomatic therapy suggested: push fluids, rest and return office visit prn if symptoms persist or worsen. Lack of antibiotic effectiveness discussed with him. Call or return to clinic prn if these symptoms worsen or fail to improve as anticipated.

## 2017-03-28 MED ORDER — MUPIROCIN 2 % EX OINT: 1.0000 "application " | TOPICAL_OINTMENT | Freq: Two times a day (BID) | CUTANEOUS | 0 refills | Status: DC

## 2017-03-28 NOTE — Telephone Encounter (Signed)
Note addended. Please pursue imaging as planned. Deliah BostonMichael Clark, MS, PA-C 12:05 PM, 03/28/2017

## 2017-03-29 NOTE — Telephone Encounter (Signed)
Thank you Pattricia Bossnnie and sorry for the confusion. Deliah BostonMichael Jenean Escandon, MS, PA-C 11:14 AM, 03/29/2017

## 2017-03-29 NOTE — Telephone Encounter (Signed)
No problem at all!

## 2017-03-29 NOTE — Telephone Encounter (Signed)
[facility] note on 1/7

## 2017-04-12 NOTE — Telephone Encounter (Signed)
Checked on status of pt's Ct scan and [facility]. No reason was given as to why, but for further information or possible peer to peer, they can be contacted at [phone]. Notification number provided for case is [id number].

## 2017-04-14 ENCOUNTER — Other Ambulatory Visit: Payer: Self-pay | Admitting: Physician Assistant

## 2017-04-14 ENCOUNTER — Telehealth: Payer: Self-pay | Admitting: Physician Assistant

## 2017-04-14 DIAGNOSIS — D225 Melanocytic nevi of trunk: Secondary | ICD-10-CM | POA: Diagnosis not present

## 2017-04-14 DIAGNOSIS — Z808 Family history of malignant neoplasm of other organs or systems: Secondary | ICD-10-CM | POA: Diagnosis not present

## 2017-04-14 DIAGNOSIS — L218 Other seborrheic dermatitis: Secondary | ICD-10-CM | POA: Diagnosis not present

## 2017-04-14 NOTE — Telephone Encounter (Signed)
Requesting refill of Revatio  LOV 03/24/17 with M.Clark PA-C

## 2017-04-14 NOTE — Telephone Encounter (Signed)
Copied from CRM [id number]. Topic: Quick Communication - Rx Refill/Question >> Apr 14, 2017  9:09 AM Rudi CocoLathan, Roberto Hlavaty M, NT wrote: Medication: cialis   Has the patient contacted their pharmacy? yes   (Agent: If no, request that the patient contact the pharmacy for the refill.)   Preferred Pharmacy (with phone number or street name): [facility] [place] - [address] [place] [id number] Phone: [phone] Fax: [phone]     Agent: Please be advised that RX refills may take up to 3 business days. We ask that you follow-up with your pharmacy.

## 2017-04-15 ENCOUNTER — Encounter: Payer: Self-pay | Admitting: Urgent Care

## 2017-04-15 ENCOUNTER — Ambulatory Visit: Payer: 59 | Admitting: Urgent Care

## 2017-04-15 VITALS — BP 154/80 | HR 66 | Temp 97.7°F | Resp 18 | Ht 68.25 in | Wt 217.6 lb

## 2017-04-15 DIAGNOSIS — E119 Type 2 diabetes mellitus without complications: Secondary | ICD-10-CM

## 2017-04-15 DIAGNOSIS — R631 Polydipsia: Secondary | ICD-10-CM | POA: Diagnosis not present

## 2017-04-15 DIAGNOSIS — R0789 Other chest pain: Secondary | ICD-10-CM

## 2017-04-15 LAB — POCT GLYCOSYLATED HEMOGLOBIN (HGB A1C): HEMOGLOBIN A1C: 6.8

## 2017-04-15 MED ORDER — METFORMIN HCL 500 MG PO TABS: 500.0000 mg | ORAL_TABLET | Freq: Every day | ORAL | 1 refills | Status: DC

## 2017-04-15 NOTE — Patient Instructions (Addendum)
Salads - kale, spinach, cabbage, spring mix; use seeds like pumpkin seeds or sunflower seeds; you can also use 1-2 hard boiled eggs. Fruits - avocadoes, berries (blueberries, raspberries, blackberries), apples, oranges, pomegranate, grapefruit Seeds - quinoa, chia seeds; you can also incorporate oatmeal Vegetables - aspargus, cauliflower, broccoli, green beans, brussel spouts, bell peppers; stay away from starchy vegetables like potatoes, carrots, peas  Brussel sprouts - Cut off stems. Place in a mixing bowl that has a lid. Pour in a 1/4-1/2 cup olive oil, spices, use a light amount of parmesan. Place on a baking sheet. Bake for 10 minutes at 400F. Take it out, eat the brussel chips. Place for another 5-10 minutes.   Mashed cauliflower - Boil a bunch of cauliflower in a pot of water. Blend in a food processor with 1-2 tablespoons of butter.  Spaghetti squash -  Cut the squash in half very carefully, clean out seeds from the middle. Place 1/2 face down in a microwave safe dish with at least 2 inches of water. Make 4-6 slits on outside of spaghetti squash and microwave for 10-12 minutes. Take out the spaghetti using a metal spoon. Repeat for the other half.   Vega protein is good protein powder, make sure you use ~6 ice cubes to give it smoothie consistency together with ~4-6 ounces of vanilla soy milk. Throw cinnamon into your shake, use peanut butter. You can also use the fruits that I listed above. Throw spinach or kale into the shake.       Diabetes Mellitus and Nutrition When you have diabetes (diabetes mellitus), it is very important to have healthy eating habits because your blood sugar (glucose) levels are greatly affected by what you eat and drink. Eating healthy foods in the appropriate amounts, at about the same times every day, can help you:  Control your blood glucose.  Lower your risk of heart disease.  Improve your blood pressure.  Reach or maintain a healthy weight.  Every  person with diabetes is different, and each person has different needs for a meal plan. Your health care provider may recommend that you work with a diet and nutrition specialist (dietitian) to make a meal plan that is best for you. Your meal plan may vary depending on factors such as:  The calories you need.  The medicines you take.  Your weight.  Your blood glucose, blood pressure, and cholesterol levels.  Your activity level.  Other health conditions you have, such as heart or kidney disease.  How do carbohydrates affect me? Carbohydrates affect your blood glucose level more than any other type of food. Eating carbohydrates naturally increases the amount of glucose in your blood. Carbohydrate counting is a method for keeping track of how many carbohydrates you eat. Counting carbohydrates is important to keep your blood glucose at a healthy level, especially if you use insulin or take certain oral diabetes medicines. It is important to know how many carbohydrates you can safely have in each meal. This is different for every person. Your dietitian can help you calculate how many carbohydrates you should have at each meal and for snack. Foods that contain carbohydrates include:  Bread, cereal, rice, pasta, and crackers.  Potatoes and corn.  Peas, beans, and lentils.  Milk and yogurt.  Fruit and juice.  Desserts, such as cakes, cookies, ice cream, and candy.  How does alcohol affect me? Alcohol can cause a sudden decrease in blood glucose (hypoglycemia), especially if you use insulin or take certain oral diabetes  medicines. Hypoglycemia can be a life-threatening condition. Symptoms of hypoglycemia (sleepiness, dizziness, and confusion) are similar to symptoms of having too much alcohol. If your health care provider says that alcohol is safe for you, follow these guidelines:  Limit alcohol intake to no more than 1 drink per day for nonpregnant women and 2 drinks per day for men. One  drink equals 12 oz of beer, 5 oz of wine, or 1 oz of hard liquor.  Do not drink on an empty stomach.  Keep yourself hydrated with water, diet soda, or unsweetened iced tea.  Keep in mind that regular soda, juice, and other mixers may contain a lot of sugar and must be counted as carbohydrates.  What are tips for following this plan? Reading food labels  Start by checking the serving size on the label. The amount of calories, carbohydrates, fats, and other nutrients listed on the label are based on one serving of the food. Many foods contain more than one serving per package.  Check the total grams (g) of carbohydrates in one serving. You can calculate the number of servings of carbohydrates in one serving by dividing the total carbohydrates by 15. For example, if a food has 30 g of total carbohydrates, it would be equal to 2 servings of carbohydrates.  Check the number of grams (g) of saturated and trans fats in one serving. Choose foods that have low or no amount of these fats.  Check the number of milligrams (mg) of sodium in one serving. Most people should limit total sodium intake to less than 2,300 mg per day.  Always check the nutrition information of foods labeled as "low-fat" or "nonfat". These foods may be higher in added sugar or refined carbohydrates and should be avoided.  Talk to your dietitian to identify your daily goals for nutrients listed on the label. Shopping  Avoid buying canned, premade, or processed foods. These foods tend to be high in fat, sodium, and added sugar.  Shop around the outside edge of the grocery store. This includes fresh fruits and vegetables, bulk grains, fresh meats, and fresh dairy. Cooking  Use low-heat cooking methods, such as baking, instead of high-heat cooking methods like deep frying.  Cook using healthy oils, such as olive, canola, or sunflower oil.  Avoid cooking with butter, cream, or high-fat meats. Meal planning  Eat meals and  snacks regularly, preferably at the same times every day. Avoid going long periods of time without eating.  Eat foods high in fiber, such as fresh fruits, vegetables, beans, and whole grains. Talk to your dietitian about how many servings of carbohydrates you can eat at each meal.  Eat 4-6 ounces of lean protein each day, such as lean meat, chicken, fish, eggs, or tofu. 1 ounce is equal to 1 ounce of meat, chicken, or fish, 1 egg, or 1/4 cup of tofu.  Eat some foods each day that contain healthy fats, such as avocado, nuts, seeds, and fish. Lifestyle   Check your blood glucose regularly.  Exercise at least 30 minutes 5 or more days each week, or as told by your health care provider.  Take medicines as told by your health care provider.  Do not use any products that contain nicotine or tobacco, such as cigarettes and e-cigarettes. If you need help quitting, ask your health care provider.  Work with a Veterinary surgeon or diabetes educator to identify strategies to manage stress and any emotional and social challenges. What are some questions to ask my  health care provider?  Do I need to meet with a diabetes educator?  Do I need to meet with a dietitian?  What number can I call if I have questions?  When are the best times to check my blood glucose? Where to find more information:  American Diabetes Association: diabetes.org/food-and-fitness/food  Academy of Nutrition and Dietetics: https://www.vargas.com/  [employer] of Diabetes and Digestive and Kidney Diseases (NIH): FindJewelers.cz Summary  A healthy meal plan will help you control your blood glucose and maintain a healthy lifestyle.  Working with a diet and nutrition specialist (dietitian) can help you make a meal plan that is best for you.  Keep in mind that carbohydrates and alcohol have immediate effects on  your blood glucose levels. It is important to count carbohydrates and to use alcohol carefully. This information is not intended to replace advice given to you by your health care provider. Make sure you discuss any questions you have with your health care provider. Document Released: 12/04/2004 Document Revised: 04/13/2016 Document Reviewed: 04/13/2016 Elsevier Interactive Patient Education  2018 [employer].     IF you received an x-ray today, you will receive an invoice from [facility]. Please contact [facility] at [phone] with questions or concerns regarding your invoice.   IF you received labwork today, you will receive an invoice from [facility]. Please contact [facility] at [phone] with questions or concerns regarding your invoice.   Our billing staff will not be able to assist you with questions regarding bills from these companies.  You will be contacted with the lab results as soon as they are available. The fastest way to get your results is to activate your My Chart account. Instructions are located on the last page of this paperwork. If you have not heard from [place] regarding the results in 2 weeks, please contact this office.

## 2017-04-15 NOTE — Telephone Encounter (Signed)
Was seen In office today. Pls advise

## 2017-04-15 NOTE — Progress Notes (Signed)
MRN: [id number] DOB: 1955-10-05  Subjective:   Chris [name] is a 62 y.o. male presenting for 2 day history of dry mouth. Has chronic runny nose, drainage, states that he frequently wakes up with mucus and needing to clear his throat or sound like he has mucus in his throat. He is supposed to be using Xyzal, uses nasal saline spray. Also reports onset of chest tightness. Did strenuous work activities yesterday including a lot of heavy lifting. Denies smoking cigarettes. Denies fever, sinus pain, ear pain, throat pain, chest pain, shob, wheezing, n/v, abdominal pain, diaphoresis, limb pain, neck pain, jaw pain. Has a history of CAD. Will [facility] Mel[facility].  [name] has a current medication list which includes the following prescription(s): aspirin, atorvastatin, chlorpheniramine, chlorthalidone, metoprolol succinate, mupirocin ointment, fish oil, pantoprazole, rivaroxaban, tadalafil, nitroglycerin, and pantoprazole. Also is allergic to ace inhibitors and ropinirole hcl.  [name]  has a past medical history of Acute MI, inferior wall, initial episode of care [facility](HCC) (2009), Anemia, Arthritis, Coronary atherosclerosis of unspecified type of vessel, native or graft, GERD (gastroesophageal reflux disease), High cholesterol, Hypertension, Seasonal allergies, and Sleep apnea. Also  has a past surgical history that includes ORIF ankle fracture (Left, 11/27/2015); laparoscopic appendectomy (N/A, 09/16/2016); Appendectomy; Cholecystectomy open (~ 2008); Fracture surgery; Coronary angioplasty with stent (2009); Laparoscopic incisional / umbilical / ventral hernia repair (11/30/2016); Hernia repair; Ventral hernia repair (N/A, 11/30/2016); and Insertion of mesh (N/A, 11/30/2016). His family history includes CAD in his brother; Heart disease in his mother; Hypertension in his father; Prostate cancer in his brother.   Objective:   Vitals: BP (!) 154/80   Pulse 66   Temp 97.7 F (36.5  C) (Oral)   Resp 18   Ht 5' 8.25" (1.734 m)   Wt 217 lb 9.6 oz (98.7 kg)   SpO2 100%   BMI 32.84 kg/m   BP Readings from Last 3 Encounters:  04/15/17 (!) 154/80  03/24/17 (!) 144/81  03/04/17 (!) 142/70    Physical Exam  Constitutional: He is oriented to person, place, and time. He appears well-developed and well-nourished.  HENT:  TM's intact bilaterally, no effusions or erythema. Nasal turbinates dry, erythematous, nasal passages patent. No sinus tenderness. Oropharynx post-nasal drainage, mucous membranes moist.    Eyes: Right eye exhibits no discharge. Left eye exhibits no discharge.  Neck: Normal range of motion. Neck supple.  Cardiovascular: Normal rate, regular rhythm and intact distal pulses. Exam reveals no gallop and no friction rub.  No murmur heard. Pulmonary/Chest: No respiratory distress. He has no wheezes. He has no rales. He exhibits no tenderness.  Abdominal: Soft. Bowel sounds are normal. He exhibits no distension and no mass. There is no tenderness. There is no guarding.  Musculoskeletal: He exhibits no edema.  Lymphadenopathy:    He has no cervical adenopathy.  Neurological: He is alert and oriented to person, place, and time.  Skin: Skin is warm and dry.  Psychiatric: He has a normal mood and affect.   Results for orders placed or performed in visit on 04/15/17 (from the past 24 hour(s))  POCT glycosylated hemoglobin (Hb A1C)     Status: None   Collection Time: 04/15/17  9:43 AM  Result Value Ref Range   Hemoglobin A1C 6.8    ECG interpretation - Sinus rhythm at 60 bpm, no acute findings. Very comparable to ecg from 12/06/2016.  Assessment and Plan :   Chest tightness - Plan: EKG 12-Lead, POCT glycosylated  hemoglobin (Hb A1C), Comprehensive metabolic panel  Type 2 diabetes mellitus without complication, without long-term current use of insulin (HCC)  Polydipsia - Plan: POCT glycosylated hemoglobin (Hb A1C)  Counseled on conservative management for  his chest tightness which is likely related to the physical activity he had yesterday. Dry mouth may be symptom of diabetes. Counseled on diagnosis, need for dietary modifications. Will start metformin 500mg  once daily. He is to recheck with PA-Clark.  Wallis Bamberg, PA-C Primary Care at [facility] [phone] 04/15/2017  9:01 AM

## 2017-04-16 LAB — COMPREHENSIVE METABOLIC PANEL
A/G RATIO: 1.5 (ref 1.2–2.2)
ALBUMIN: 4.5 g/dL (ref 3.6–4.8)
ALT: 36 IU/L (ref 0–44)
AST: 31 IU/L (ref 0–40)
Alkaline Phosphatase: 88 IU/L (ref 39–117)
BUN / CREAT RATIO: 16 (ref 10–24)
BUN: 14 mg/dL (ref 8–27)
Bilirubin Total: 0.5 mg/dL (ref 0.0–1.2)
CALCIUM: 9.9 mg/dL (ref 8.6–10.2)
CO2: 25 mmol/L (ref 20–29)
CREATININE: 0.87 mg/dL (ref 0.76–1.27)
Chloride: 96 mmol/L (ref 96–106)
GFR, EST AFRICAN AMERICAN: 108 mL/min/{1.73_m2} (ref >59)
GFR, EST NON AFRICAN AMERICAN: 93 mL/min/{1.73_m2} (ref >59)
GLOBULIN, TOTAL: 3 g/dL (ref 1.5–4.5)
Glucose: 122 mg/dL — ABNORMAL HIGH (ref 65–99)
POTASSIUM: 3.7 mmol/L (ref 3.5–5.2)
SODIUM: 140 mmol/L (ref 134–144)
TOTAL PROTEIN: 7.5 g/dL (ref 6.0–8.5)

## 2017-04-17 NOTE — Telephone Encounter (Signed)
[facility] on 04/15/17, ok to fill this rx?

## 2017-04-20 ENCOUNTER — Other Ambulatory Visit: Payer: Self-pay | Admitting: Physician Assistant

## 2017-04-21 NOTE — Telephone Encounter (Signed)
Negative.  I just refilled his viagra.

## 2017-04-28 ENCOUNTER — Telehealth: Payer: Self-pay | Admitting: Physician Assistant

## 2017-04-28 NOTE — Telephone Encounter (Deleted)
Phone call to patient. He states that he noticed  last night his semen color is light brown. Denies any recent unprotected intercourse, no new partners, no recent injury. No pain. Denies any other symptoms - fever, numbness/tingling. Denies issues with urination.   He wonders if this could be a potential side effect of any of his medications after getting on the internet last night to investigate his symptoms.   Provider, please advise.

## 2017-04-28 NOTE — Telephone Encounter (Signed)
Phone call to patient. He states that he noticed  last night his semen color is light brown. Denies any recent unprotected intercourse, no new partners, no recent injury. No pain. Denies any other symptoms - fever, numbness/tingling. Denies issues with urination.   He wonders if this could be a potential side effect of any of his medications after getting on the internet last night to investigate his symptoms.   Provider, please advise.  

## 2017-04-28 NOTE — Telephone Encounter (Deleted)
Copied from CRM #[id number]. Topic: Inquiry >> Apr 28, 2017  1:31 PM Simmons, Janett L, NT wrote: Reason for CRM: Patient would like a return call from Michael Clark  would no disclose reason for  call   

## 2017-04-28 NOTE — Telephone Encounter (Signed)
Copied from CRM [id number]. Topic: Inquiry >> Apr 28, 2017  1:31 PM Stephannie LiSimmons, Janett L, NT wrote: Reason for CRM: Patient would like a return call from Deliah BostonMichael Clark  would no disclose reason for  call

## 2017-04-29 ENCOUNTER — Telehealth: Payer: Self-pay | Admitting: Physician Assistant

## 2017-04-29 NOTE — Telephone Encounter (Signed)
Copied from CRM [id number]. Topic: Inquiry >> Apr 28, 2017  1:31 PM Stephannie LiSimmons, Janett L, NT wrote: Reason for CRM: Patient would like a return call from Deliah BostonMichael Clark  would no disclose reason for  call  >> Apr 29, 2017 11:52 AM Rudi CocoLathan, Jonisha Kindig M, NT wrote: patient would like a return call from Dr. Deliah BostonMichael Clark would not disclose reason for call. Pt. Can be reached at [phone]

## 2017-04-30 NOTE — Telephone Encounter (Signed)
Pt has appointment for Wednesday with Deliah BostonMichael Clark per Casimiro NeedleMichael

## 2017-05-03 ENCOUNTER — Ambulatory Visit (INDEPENDENT_AMBULATORY_CARE_PROVIDER_SITE_OTHER): Payer: BLUE CROSS/BLUE SHIELD | Admitting: Cardiovascular Disease

## 2017-05-03 ENCOUNTER — Encounter: Payer: Self-pay | Admitting: Cardiovascular Disease

## 2017-05-03 VITALS — BP 122/66 | HR 71 | Ht 69.0 in | Wt 212.8 lb

## 2017-05-03 DIAGNOSIS — I2510 Atherosclerotic heart disease of native coronary artery without angina pectoris: Secondary | ICD-10-CM | POA: Diagnosis not present

## 2017-05-03 DIAGNOSIS — E782 Mixed hyperlipidemia: Secondary | ICD-10-CM

## 2017-05-03 NOTE — Patient Instructions (Signed)

## 2017-05-03 NOTE — Telephone Encounter (Signed)
I called him back.  He is going to come into the office near the middle of next week.

## 2017-05-03 NOTE — Progress Notes (Signed)
Chris [name]  Date of Birth  1956/01/01   Problems: 1. Coronary artery disease- He presented in 2009 with some episodes of chest pain and was found to have an inferior wall myocardial infarction. He underwent successful PTCA and stenting of his mid  right coronary artery using a 2.75 x 28 mm Promus stent. The stent was post dilated using a 3.0 noncompliant balloon.  We then positioned a 3.0 x 15 mm Promus stent in the proximal right coronary artery.  The stent was post dilated using a 3.25 mm noncompliant balloon. 2. Hyperlipidemia 3. Sleep apnea 4. Hypertension     Chris [name] is a 62 year old gentleman. He has a history of coronary artery disease. He presented in 2009 with some episodes of chest pain and was found to have an inferior wall myocardial infarction. He underwent successful PTCA and stenting of his mid  right coronary artery using a 2.75 x 28 mm Promus stent. The stent was post dilated using a 3.0 noncompliant balloon.  We then positioned a 3.0 x 15 mm Promus stent in the proximal right coronary artery.  The stent was post dilated using a 3.25 mm noncompliant balloon.  Pt is doing well.  No angina or dyspnea.    He has not been sleeping well.  He also did not take his meds this am.  April 13, 2012: Chris [name] is doing well from a cardiac standpoint.  Exercising on the treadmill every other day. Complains of constipation.  October 17, 2012:  Thank started having some episodes of chest pain last week.  He had been doing lots of physical activity - played 36 holes of golf a day  for 2 days straight.  He also has been busy doing phyisical work.     He had a stress Myoview study. He walked for 11 minutes on a standard Bruce protocol triple test. A Myoview study revealed an old inferior wall myocardial infarction but was otherwise unremarkable.   June 14, 2013:  He is doing well .Chris [name] Has some back pain and "crick" in his neck.  No CP .  Staying active, walking in the evenings 2.5 miles    Sept. 29, 2015:  Chris [name] is doing ok.  He is not working at [employer].   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  Doing well from a cardiac standpoint.  Still walking quite a bit.    Aug. 29, 2016:  Doing well. Owns his own business.  [name]  No CP , no dyspnea.   Jan. 17, 2017: No angina  Is staying busy Working hard.   Oct. 9, 2017:   Chris [name] is seen today for follow up of his CAD Is healing up from a left ankle injury.  Walking on crutches .   Wears him out.  Has not had any chest pain  Or dyspnea  Aug. 14, 2018: Chris [name] is seen for follow up for his CAD Had emergent Appy surgery. Was found to have a ventral hernia at that time. Now needs to have hernia repair.  He had no complications with his emergent appendectomy. He's been doing well. No episodes of chest pain or short of breath.  Feb. 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   No CP or dyspnea Works out on th eElliptical  Primary MD added Chlorthaladone .   2 years ago , he had broke his left ankle and developed a DVT / pulmonary  embolus .  Chris [name]   Current Outpatient Medications on File Prior to Visit  Medication Sig Dispense Refill  . aspirin 81 MG tablet Take 81 mg by mouth daily.     Chris [name] atorvastatin (LIPITOR) 40 MG tablet TAKE 1 TABLET (40 MG TOTAL) BY MOUTH DAILY. 30 tablet 11  . chlorthalidone (HYGROTON) 25 MG tablet Take 0.5 tablets (12.5 mg total) by mouth daily. 45 tablet 1  . metFORMIN (GLUCOPHAGE) 500 MG tablet Take 1 tablet (500 mg total) by mouth daily with breakfast. 90 tablet 1  . metoprolol succinate (TOPROL-XL) 25 MG 24 hr tablet TAKE 1/2 TABLET BY MOUTH TWICE A DAY 90 tablet 2  . rivaroxaban (XARELTO) 20 MG TABS tablet Take 1 tablet (20 mg total) by mouth daily with supper. 90 tablet 1  . pantoprazole (PROTONIX) 40 MG tablet Take 1 tablet (40 mg total) by mouth daily. TAKE 40 MG BY MOUTH EVERY DAY AS NEEDED ACID REFLUX 30  tablet 0   No current facility-administered medications on file prior to visit.     Allergies  Allergen Reactions  . Ace Inhibitors Rash  . Ropinirole Hcl Rash    Past Medical History:  Diagnosis Date  . Acute MI, inferior wall, initial episode of care [facility]) 2009  . Anemia   . Arthritis   . Coronary atherosclerosis of unspecified type of vessel, native or graft   . GERD (gastroesophageal reflux disease)   . High cholesterol   . Hypertension   . Seasonal allergies   . Sleep apnea    "I've never worn a mask" (09/17/2016)    Past Surgical History:  Procedure Laterality Date  . APPENDECTOMY    . CHOLECYSTECTOMY OPEN  ~ 2008   "it exploded"  . CORONARY ANGIOPLASTY WITH STENT PLACEMENT  2009    2.75 x 28 mm Promus stent to the RCA  . FRACTURE SURGERY    . HERNIA REPAIR    . INSERTION OF MESH N/A 11/30/2016   Procedure: INSERTION OF MESH;  Surgeon: Chris Norman, MD;  Location[facility] OR;  Service: General;  Laterality: N/A;  . LAPAROSCOPIC APPENDECTOMY N/A 09/16/2016   Procedure: APPENDECTOMY LAPAROSCOPIC;  Surgeon: Chris Norman, MD;  Location: [facility] OR;  Service: General;  Laterality: N/A;  . LAPAROSCOPIC INCISIONAL / UMBILICAL / VENTRAL HERNIA REPAIR  11/30/2016   w/repair serosal tear  . ORIF ANKLE FRACTURE Left 11/27/2015   Procedure: OPEN REDUCTION INTERNAL FIXATION (ORIF) ANKLE FRACTURE;  Surgeon: Chris Birchwood, MD;  Location: [facility];  Service: Orthopedics;  Laterality: Left;  Chris [name] VENTRAL HERNIA REPAIR N/A 11/30/2016   Procedure: LAPAROSCOPIC VENTRAL HERNIA REPAIR AND  REPAIR OF SEROSAL TEAR;  Surgeon: Chris Norman, MD;  Location: [facility] OR;  Service: General;  Laterality: N/A;    Social History   Tobacco Use  Smoking Status Former Smoker  . Packs/day: 2.50  . Years: 36.00  . Pack years: 90.00  . Types: Cigarettes  . Last attempt to quit: 08/22/2007  . Years since quitting: 9.7  Smokeless Tobacco Never Used    Social History   Substance and Sexual Activity   Alcohol Use Yes  . Alcohol/week: 3.6 oz  . Types: 6 Cans of beer per week    Family History  Problem Relation Age of Onset  . Heart disease Mother   . Hypertension Father   . Prostate cancer Brother   . CAD Brother     Reviw of Systems:  Reviewed in the HPI.  All other systems are negative.  Physical Exam: Blood pressure 122/66, pulse 71, height 5\' 9"  (1.753 m), weight 212 lb 12.8 oz (96.5 kg), SpO2 99 %.  GEN:  Well nourished, well developed in no acute distress HEENT: Normal NECK: No JVD; No carotid bruits LYMPHATICS: No lymphadenopathy CARDIAC: RR, distant heart sounds  RESPIRATORY:  Clear to auscultation without rales, wheezing or rhonchi  ABDOMEN: Soft, non-tender, non-distended MUSCULOSKELETAL:  No edema; No deformity  SKIN: Warm and dry NEUROLOGIC:  Alert and oriented x 3  EKG:  Assessment / Plan:   .1. Coronary artery disease-   No angina   2. Hyperlipidemia-    Check lipids today ,  BMP and liver enz look great   3. Sleep apnea  4. Hypertension -   BP looks great .   5.  DVT / pulmonary embolus:   Secondary to an ankle fracture.  Continue Xarelto    Kristeen MissPhilip Mclean Moya, MD  05/03/2017 2:07 PM    [facility] [address],  [address] [place], [place]  [address] Pager [phone]- [phone] Phone: [phone]; Fax: [phone]) [phone]

## 2017-05-04 LAB — LIPID PANEL
CHOLESTEROL TOTAL: 111 mg/dL (ref 100–199)
Chol/HDL Ratio: 3.5 ratio (ref 0.0–5.0)
HDL: 32 mg/dL — AB (ref >39)
LDL Calculated: 38 mg/dL (ref 0–99)
TRIGLYCERIDES: 205 mg/dL — AB (ref 0–149)
VLDL CHOLESTEROL CAL: 41 mg/dL — AB (ref 5–40)

## 2017-05-05 ENCOUNTER — Ambulatory Visit: Payer: BLUE CROSS/BLUE SHIELD | Admitting: Physician Assistant

## 2017-05-05 ENCOUNTER — Encounter: Payer: Self-pay | Admitting: Physician Assistant

## 2017-05-05 VITALS — BP 136/81 | HR 65 | Resp 16 | Ht 69.0 in | Wt 214.6 lb

## 2017-05-05 DIAGNOSIS — R361 Hematospermia: Secondary | ICD-10-CM

## 2017-05-05 LAB — POCT CBC
GRANULOCYTE PERCENT: 50 % (ref 37–80)
HCT, POC: 43.2 % — AB (ref 43.5–53.7)
HEMOGLOBIN: 14.4 g/dL (ref 14.1–18.1)
Lymph, poc: 2.8 (ref 0.6–3.4)
MCH, POC: 28.4 pg (ref 27–31.2)
MCHC: 33.4 g/dL (ref 31.8–35.4)
MCV: 85 fL (ref 80–97)
MID (cbc): 0.6 (ref 0–0.9)
MPV: 7 fL (ref 0–99.8)
PLATELET COUNT, POC: 375 10*3/uL (ref 142–424)
POC Granulocyte: 3.3 (ref 2–6.9)
POC LYMPH %: 41.5 % (ref 10–50)
POC MID %: 8.5 %M (ref 0–12)
RBC: 5.08 M/uL (ref 4.69–6.13)
RDW, POC: 13.6 %
WBC: 6.7 10*3/uL (ref 4.6–10.2)

## 2017-05-05 LAB — POC MICROSCOPIC URINALYSIS (UMFC): Mucus: ABSENT

## 2017-05-05 LAB — POCT URINALYSIS DIP (MANUAL ENTRY)
Bilirubin, UA: NEGATIVE
Glucose, UA: NEGATIVE mg/dL
Leukocytes, UA: NEGATIVE
Nitrite, UA: NEGATIVE
PH UA: 6 (ref 5.0–8.0)
PROTEIN UA: NEGATIVE mg/dL
RBC UA: NEGATIVE
SPEC GRAV UA: 1.02 (ref 1.010–1.025)
UROBILINOGEN UA: 0.2 U/dL

## 2017-05-05 MED ORDER — CIPROFLOXACIN HCL 500 MG PO TABS: 500.0000 mg | ORAL_TABLET | Freq: Two times a day (BID) | ORAL | 0 refills | Status: DC

## 2017-05-05 NOTE — Progress Notes (Signed)
05/10/2017 10:54 AM   DOB: September 01, 1955 / MRN: [id number]  SUBJECTIVE:  Chris [name] is a 62 y.o. male presenting for hematospermia. He takes xarelto daily for a DVT to PE provoked by sugery.   He has had 2 episodes of hematospermia in the last 2 weeks the first was the worst, he tells me the second episode was much lighter.  He denies any pain today.  He has a history of mildly enlarged prostate is seen on CT scan 2 years ago.  He denies any dysuria, urinary hesitancy, frequency, urgency.  He has chronic post void dribble and states it takes about a minute or so or he will be damp in his undergarments.  He is allergic to ace inhibitors and ropinirole hcl.   He  has a past medical history of Acute MI, inferior wall, initial episode of care [facility](HCC) (2009), Anemia, Arthritis, Coronary atherosclerosis of unspecified type of vessel, native or graft, GERD (gastroesophageal reflux disease), High cholesterol, Hypertension, Seasonal allergies, and Sleep apnea.    He  reports that he quit smoking about 9 years ago. His smoking use included cigarettes. He has a 90.00 pack-year smoking history. he has never used smokeless tobacco. He reports that he drinks about 3.6 oz of alcohol per week. He reports that he uses drugs. Drug: Amphetamines. He  reports that he currently engages in sexual activity. The patient  has a past surgical history that includes ORIF ankle fracture (Left, 11/27/2015); laparoscopic appendectomy (N/A, 09/16/2016); Appendectomy; Cholecystectomy open (~ 2008); Fracture surgery; Coronary angioplasty with stent (2009); Laparoscopic incisional / umbilical / ventral hernia repair (11/30/2016); Hernia repair; Ventral hernia repair (N/A, 11/30/2016); and Insertion of mesh (N/A, 11/30/2016).  His family history includes CAD in his brother; Heart disease in his mother; Hypertension in his father; Prostate cancer in his brother.  Review of Systems  Constitutional: Negative for chills, diaphoresis and  fever.  Eyes: Negative.   Respiratory: Negative for cough, hemoptysis, sputum production, shortness of breath and wheezing.   Cardiovascular: Negative for chest pain, orthopnea and leg swelling.  Gastrointestinal: Negative for nausea.  Skin: Negative for rash.  Neurological: Negative for dizziness, sensory change, speech change, focal weakness and headaches.    The problem list and medications were reviewed and updated by myself where necessary and exist elsewhere in the encounter.   OBJECTIVE:  BP 136/81 (BP Location: Right Arm, Patient Position: Sitting, Cuff Size: Normal)   Pulse 65   Resp 16   Ht 5\' 9"  (1.753 m)   Wt 214 lb 9.6 oz (97.3 kg)   SpO2 99%   BMI 31.69 kg/m   Physical Exam  Constitutional: He appears well-developed. He is active and cooperative.  Non-toxic appearance.  Cardiovascular: Normal rate, regular rhythm, S1 normal, S2 normal, normal heart sounds, intact distal pulses and normal pulses. Exam reveals no gallop and no friction rub.  No murmur heard. Pulmonary/Chest: Effort normal. No stridor. No tachypnea. No respiratory distress. He has no wheezes. He has no rales.  Abdominal: He exhibits no distension.  Genitourinary: Testes normal. Prostate is enlarged and tender. Right testis shows no mass, no swelling and no tenderness. Left testis shows no mass, no swelling and no tenderness.  Musculoskeletal: He exhibits no edema.  Neurological: He is alert.  Skin: Skin is warm and dry. He is not diaphoretic. No pallor.  Vitals reviewed.   No results found for this or any previous visit (from the past 72 hour(s)). Lab Results  Component Value Date  PSA 0.83 05/21/2014     No results found.  ASSESSMENT AND PLAN:  Chris [name] was seen today for blood in sperm.  Diagnoses and all orders for this visit:  Hematospermia prostate exam today.  This may explain his hematospermia.  I will try him on Cipro 500 twice daily for 10 days.  If this fails he will see urology and  I have already placed this referral. -     POCT CBC -     POCT urinalysis dipstick -     POCT Microscopic Urinalysis ([facility]) -     PSA -     Urine Culture -     GC/Chlamydia Probe Amp -     Ambulatory referral to Urology -     ciprofloxacin (CIPRO) 500 MG tablet; Take 1 tablet (500 mg total) by mouth 2 (two) times daily.  : He has some tenderness on his  The patient is advised to call or return to clinic if he does not see an improvement in symptoms, or to seek the care of the closest emergency department if he worsens with the above plan.   [name], MHS, PA-C Primary Care at [facility] 05/10/2017 10:54 AM

## 2017-05-06 LAB — URINE CULTURE: Organism ID, Bacteria: NO GROWTH

## 2017-05-06 LAB — PSA: PROSTATE SPECIFIC AG, SERUM: 1 ng/mL (ref 0.0–4.0)

## 2017-05-06 LAB — GC/CHLAMYDIA PROBE AMP
Chlamydia trachomatis, NAA: NEGATIVE
Neisseria gonorrhoeae by PCR: NEGATIVE

## 2017-05-08 NOTE — Telephone Encounter (Signed)
Please see change in coverage. Patient now has [employer].  I will not take him off of his xarelto without an updated scan of his leg and lungs.  If it is denied again I will inform patient that he will need to continue Xarelto. Deliah BostonMichael Clark, MS, PA-C 8:07 AM, 05/08/2017

## 2017-05-10 ENCOUNTER — Telehealth: Payer: Self-pay | Admitting: Physician Assistant

## 2017-05-10 NOTE — Telephone Encounter (Signed)
Patient phoned with question regarding his medication, Hygroton 25 MG. He stated he misunderstood the directions to just take half a tablet and has been taking a whole tablet. Was last seen 2/13 by Deliah BostonMichael Clark, PA, B/P was 136/81 HR 65 and seen by Dr. Elease HashimotoNahser on 2/11. B/P at 2/11 visit was 122/66 HR 71. He did not discover he was taking the wrong dosage until returning to the pharmacy and couldn't get a refill, however, they gave him 4 tablets at a price of $ 20.  So at this time he has 3 tablets left.  Please advise regarding a refill that he need. Is it possible to check with his insurance, [employer] to see if they will cover the extra he will need. He does understand at this time that he should take only half tablet. He also denies any hypotensive symptoms.  Please advise.

## 2017-05-10 NOTE — Telephone Encounter (Signed)
Relation to pt: self Call back number: [phone] Pharmacy: [id number] - [place], [place] - [address] [phone]) [phone] (Fax)    Reason for call:  Patient in need of clarity regarding chlorthalidone (HYGROTON) 25 MG tablet Take 0.5 tablets (12.5 mg total) by mouth daily.Patient states he has 3 pills left and pharmacy informed he is not due for a refill, patient would like to discuss the frequency, please advise

## 2017-05-12 MED ORDER — CHLORTHALIDONE 25 MG PO TABS: 25.0000 mg | ORAL_TABLET | Freq: Every day | ORAL | 3 refills | Status: DC

## 2017-05-12 NOTE — Telephone Encounter (Signed)
Rose please refill at the 25 mg daily, #90 with 3 refills.   His  BP looks fantastic.

## 2017-05-12 NOTE — Telephone Encounter (Signed)
Pt would like a call to know what he should do regarding his mediaction, Hygroton. He is out of medication

## 2017-05-12 NOTE — Telephone Encounter (Signed)
Medication issue - pt taking Hygroton 25mg  incorrectly. Correct instructions by PEC given. Sent to Deliah BostonMichael Clark to advise.

## 2017-05-12 NOTE — Telephone Encounter (Signed)
Hygroton new prescription sent per note Deliah BostonMichael Clark, PA-C 05/12/17, patient was informed, he verbalized understanding of administration directions 1 tab daily.

## 2017-06-08 ENCOUNTER — Telehealth: Payer: Self-pay | Admitting: Physician Assistant

## 2017-06-08 ENCOUNTER — Ambulatory Visit (HOSPITAL_COMMUNITY)
Admission: RE | Admit: 2017-06-08 | Discharge: 2017-06-08 | Disposition: A | Discharge status: Home / Self Care | Payer: BLUE CROSS/BLUE SHIELD | Source: Ambulatory Visit | Attending: Physician Assistant | Admitting: Physician Assistant

## 2017-06-08 DIAGNOSIS — I2699 Other pulmonary embolism without acute cor pulmonale: Secondary | ICD-10-CM | POA: Insufficient documentation

## 2017-06-08 NOTE — Telephone Encounter (Signed)
Copied from CRM [id number]. Topic: Quick Communication - See Telephone Encounter >> Jun 08, 2017 10:42 AM Diana EvesHoyt, Maryann B wrote: CRM for notification. See Telephone encounter for:  Pt is calling about ultrasound and to get off blood thinner and requesting Casimiro NeedleMichael to give him a call.  06/08/17.

## 2017-06-08 NOTE — Progress Notes (Signed)
LLE venous duplex prelim: negative for DVT. Farrel DemarkJill Eunice, RDMS, RVT Attempted call report to main number with long wait time. Will let patient leave.

## 2017-06-09 NOTE — Telephone Encounter (Signed)
Patient would like a call back from CrestlineElizabeth because the message left was unclear and he still has questions about blood thinners and his results.

## 2017-06-09 NOTE — Telephone Encounter (Signed)
Phone call to patient. Per signed authorization, left message for patient stating results are back with message from provider- please call back if any other questions.

## 2017-06-10 ENCOUNTER — Telehealth: Payer: Self-pay | Admitting: Physician Assistant

## 2017-06-10 NOTE — Telephone Encounter (Signed)
Thank you Lizzie.  Deliah BostonMichael Ceylin Dreibelbis, MS, PA-C 2:32 PM, 06/10/2017

## 2017-06-10 NOTE — Telephone Encounter (Signed)
Pt is scheduled for CT scan tomorrow morning 06/11/17. I have initiated prior auth with new insurance [employer] but this has still been denied. I spoke with Alvino ChapelEllen, Contractorinsurance coordinator at [facility], and she said we could wait until 10:00 tomorrow morning to see if we can get auth and if not, they will cancel pt's appt. The case is expected to close on 06/14/17 but if provider would like to call in the morning for a peer to peer, we can try to get this approved quicker. Provider can call [phone] and give pt's ID which is [id number] and DOB [birth year]. I advised pt that we will try to work on this tomorrow and will let him know by 10am whether scan can be done or not. If cannot get approval by 10, [facility] will cancel appt. Thanks!

## 2017-06-10 NOTE — Telephone Encounter (Signed)
Phone call to patient. Reviewed venous ultrasound and message from provider with patient. He verbalizes understanding. He states blood in his sperm "was a little bit, and then there wasn't any". This was last week. States this was the last time he was sexually active.   Numbers for scheduling urology appointment and CT provided to patient in a MyChart message.   Provider, Lorain ChildesFYI.

## 2017-06-11 ENCOUNTER — Other Ambulatory Visit: Payer: BLUE CROSS/BLUE SHIELD

## 2017-06-11 NOTE — Telephone Encounter (Signed)
Chris [name] called and spoke with peer to peer line with [employer]. They still are unwilling to cover CT scan. Chris [name] advised that he will not take pt off of Xarelto since scan cannot be done, and if pt is still desiring this, he can follow up with his cardiologist and ask this. I spoke with the pt and advised that insurance still will not cover scan. He stated he is going to call insurance company to find out why they will not do this. I told pt I would [facility] for him and relayed Michael's message concerning Xarelto. Pt verbalized understanding.

## 2017-06-11 NOTE — Telephone Encounter (Signed)
[employer] not willing to cover  CT angios chest status post  provoked DVT to PE.  Patient has completed 6 months of Xarelto.  I am uncomfortable with stopping the medication without objective data.  I have advised that I will not stop the medication.  I would appreciate the opinion of his cardiologist.  I have scanned his legs and they are negative for DVT at this time.  I have included the previous chest CT Angio for convenience.  I will forward to Dr. Melburn PopperNasher in cardiology and would greatly appreciate his opinion.  Patient does have at least 3 more months of Xarelto prescribed at this time.  FINDINGS: Cardiovascular: Contrast injection is sufficient to demonstrate satisfactory opacification of the pulmonary arteries to the segmental level. There is nonocclusive thrombus within the proximal apical and posterior segmental arteries of the right upper lobe. No other filling defect. The main pulmonary artery is within normal limits for size. There is no CT evidence of acute right heart strain. The visualized aorta is normal. There is a normal variant aortic arch branching pattern with the brachiocephalic and left common carotid arteries sharing a common origin. Heart size is normal, without pericardial effusion. Multifocal coronary artery calcification.  Mediastinum/Nodes: No mediastinal, hilar or axillary lymphadenopathy. The visualized thyroid and thoracic esophageal course are unremarkable.  Lungs/Pleura: Mild biapical emphysema. No pulmonary nodules or masses. No pleural effusion or pneumothorax. No focal airspace consolidation. No focal pleural abnormality.  Upper Abdomen: Contrast bolus timing is not optimized for evaluation of the abdominal organs. Within this limitation, the visualized organs of the upper abdomen are normal.  Musculoskeletal: No chest wall abnormality. No acute or significant osseous findings.  Review of the MIP images confirms the above  findings.  IMPRESSION: 1. Small nonocclusive pulmonary embolus within the proximal apical and posterior segmental arteries of the right upper lobe. 2. No evidence of right heart strain. 3.  Emphysema (ICD10-J43.9).  Coronary artery atherosclerosis. Critical Value/emergent results were called by telephone at the time of interpretation on 12/05/2016 at 2:32 pm to Dr. Frederick PeersACHEL LITTLE , who verbally acknowledged these results.   Electronically Signed   By: Deatra RobinsonKevin  Herman M.D.   On: 12/05/2016 14:33

## 2017-06-14 ENCOUNTER — Telehealth: Payer: Self-pay

## 2017-06-14 NOTE — Telephone Encounter (Signed)
I did not see him around the time of the diagnosis of the DVT / PE. If it is clear that this was a provoked PE ( secondary to his ankle fracture) then 6 month of therapy is adequate. I would not think that repeat CT scan would be necessary.

## 2017-06-14 NOTE — Telephone Encounter (Signed)
Thank you for you advise.  I have spoken with him and he would like to stop the xarelto.  I am stopping this and starting him back on his plavix.  He has been on asa 81 throughout.  Thank you for your time.  Deliah BostonMichael Annaleigh Steinmeyer, MS, PA-C 11:57 AM, 06/14/2017

## 2017-06-14 NOTE — Telephone Encounter (Signed)
Pt would like update on CT study. Does pt have to pay in order to have it scheduled?  Copied from CRM [id number]. Topic: Inquiry >> Jun 11, 2017 10:09 AM Windy KalataMichael, Taylor L, NT wrote: Patient is calling about a lung appt. And would like for Deliah BostonMichael Clark nurse to contact him.

## 2017-06-15 NOTE — Telephone Encounter (Signed)
Phone call to patient. Relayed message from provider. He states that he may quit, he was drinking much more alcohol than what Casimiro NeedleMichael recommended before. Patient advised if he is going to drink alcohol that he drink provider recommended amount. Patient verbalizes understanding.

## 2017-06-15 NOTE — Telephone Encounter (Signed)
He can have 1 to 2 regular sized alcoholic beverages daily. Please relay.

## 2017-06-15 NOTE — Telephone Encounter (Signed)
Pt is wanting to know when he can start drinking alcohol after starting plavix. Pt didn't have any questions regarding CT scan. Please call him with an answer.  Please advise

## 2017-06-16 DIAGNOSIS — R361 Hematospermia: Secondary | ICD-10-CM | POA: Diagnosis not present

## 2017-06-18 ENCOUNTER — Other Ambulatory Visit: Payer: Self-pay

## 2017-06-18 ENCOUNTER — Encounter: Payer: Self-pay | Admitting: Physician Assistant

## 2017-06-18 ENCOUNTER — Ambulatory Visit: Payer: BLUE CROSS/BLUE SHIELD | Admitting: Physician Assistant

## 2017-06-18 VITALS — BP 120/70 | HR 96 | Temp 98.0°F | Ht 69.0 in | Wt 214.4 lb

## 2017-06-18 DIAGNOSIS — J069 Acute upper respiratory infection, unspecified: Secondary | ICD-10-CM

## 2017-06-18 MED ORDER — HYDROCODONE-HOMATROPINE 5-1.5 MG/5ML PO SYRP: 2.5000 mL | ORAL_SOLUTION | Freq: Every day | ORAL | 0 refills | Status: DC

## 2017-06-18 MED ORDER — AMOXICILLIN-POT CLAVULANATE 875-125 MG PO TABS: 1.0000 | ORAL_TABLET | Freq: Two times a day (BID) | ORAL | 0 refills | Status: DC

## 2017-06-18 NOTE — Progress Notes (Signed)
06/18/2017 1:41 PM   DOB: 22-Feb-1956 / MRN: [id number]  SUBJECTIVE:  Chris [name] is a 62 y.o. male presenting for sinus problems.  Patient tells me for the last 3 days he has had nasal congestion mild sore throat and cough.  Feels he is getting worse.  Denies fever, teeth pain, headache.  He has a history of sleep apnea.  He is allergic to ace inhibitors and ropinirole hcl.   He  has a past medical history of Acute MI, inferior wall, initial episode of care [facility]) (2009), Anemia, Arthritis, Coronary atherosclerosis of unspecified type of vessel, native or graft, GERD (gastroesophageal reflux disease), High cholesterol, Hypertension, Seasonal allergies, and Sleep apnea.    He  reports that he quit smoking about 9 years ago. His smoking use included cigarettes. He has a 90.00 pack-year smoking history. He has never used smokeless tobacco. He reports that he drinks about 3.6 oz of alcohol per week. He reports that he has current or past drug history. Drug: Amphetamines. He  reports that he currently engages in sexual activity. The patient  has a past surgical history that includes ORIF ankle fracture (Left, 11/27/2015); laparoscopic appendectomy (N/A, 09/16/2016); Appendectomy; Cholecystectomy open (~ 2008); Fracture surgery; Coronary angioplasty with stent (2009); Laparoscopic incisional / umbilical / ventral hernia repair (11/30/2016); Hernia repair; Ventral hernia repair (N/A, 11/30/2016); and Insertion of mesh (N/A, 11/30/2016).  His family history includes CAD in his brother; Heart disease in his mother; Hypertension in his father; Prostate cancer in his brother.  Review of Systems  Constitutional: Negative for chills, diaphoresis and fever.  Eyes: Negative.   Respiratory: Negative for shortness of breath.   Cardiovascular: Negative for chest pain, orthopnea and leg swelling.  Gastrointestinal: Negative for abdominal pain, blood in stool, constipation, diarrhea, heartburn, melena, nausea and  vomiting.  Genitourinary: Negative for flank pain.  Skin: Negative for rash.  Neurological: Negative for dizziness, sensory change, speech change, focal weakness and headaches.    The problem list and medications were reviewed and updated by myself where necessary and exist elsewhere in the encounter.   OBJECTIVE:  BP 120/70 (BP Location: Left Arm, Patient Position: Sitting, Cuff Size: Normal)   Pulse 96   Temp 98 F (36.7 C) (Oral)   Ht 5\' 9"  (1.753 m)   Wt 214 lb 6.4 oz (97.3 kg)   SpO2 96%   BMI 31.66 kg/m   Physical Exam  Constitutional: He appears well-developed. He is active and cooperative.  Non-toxic appearance.  HENT:  Right Ear: Hearing, tympanic membrane, external ear and ear canal normal.  Left Ear: Hearing, tympanic membrane, external ear and ear canal normal.  Nose: Nose normal. Right sinus exhibits no maxillary sinus tenderness and no frontal sinus tenderness. Left sinus exhibits no maxillary sinus tenderness and no frontal sinus tenderness.  Mouth/Throat: Uvula is midline, oropharynx is clear and moist and mucous membranes are normal. No oropharyngeal exudate, posterior oropharyngeal edema or tonsillar abscesses.  Eyes: Pupils are equal, round, and reactive to light. Conjunctivae are normal.  Cardiovascular: Normal rate, regular rhythm, S1 normal, S2 normal, normal heart sounds, intact distal pulses and normal pulses. Exam reveals no gallop and no friction rub.  No murmur heard. Pulmonary/Chest: Effort normal. No stridor. No tachypnea. No respiratory distress. He has no wheezes. He has no rales.  Abdominal: He exhibits no distension.  Musculoskeletal: He exhibits no edema.  Lymphadenopathy:       Head (right side): No submandibular and no tonsillar adenopathy present.  Head (left side): No submandibular and no tonsillar adenopathy present.    He has no cervical adenopathy.  Neurological: He is alert.  Skin: Skin is warm and dry. He is not diaphoretic. No  pallor.  Vitals reviewed.   No results found for this or any previous visit (from the past 72 hour(s)).  No results found.  ASSESSMENT AND PLAN:  [name] was seen today for sinus issues.  Diagnoses and all orders for this visit:  Acute URI: Most likely viral.  Advised that he hold onto the antibiotic and if he has worsening at any point you can start or can start after 7-8 days of symptoms if they are not improving. -     amoxicillin-clavulanate (AUGMENTIN) 875-125 MG tablet; Take 1 tablet by mouth 2 (two) times daily. Start if you are not getting better by day 8 or 9 of symptoms.  Other orders -     Discontinue: HYDROcodone-homatropine (HYCODAN) 5-1.5 MG/5ML syrup; Take 2.5-5 mLs by mouth at bedtime for 5 days.    The patient is advised to call or return to clinic if he does not see an improvement in symptoms, or to seek the care of the closest emergency department if he worsens with the above plan.   Deliah BostonMichael Marina Boerner, MHS, PA-C [facility] Care at [facility] 06/18/2017 1:41 PM

## 2017-06-18 NOTE — Patient Instructions (Addendum)
Dont fill the antibiotic unless you are not getting better.  Okay to start at day 8 or 9 of illness.      IF you received an x-ray today, you will receive an invoice from [facility]. Please contact [facility] at [phone] with questions or concerns regarding your invoice.   IF you received labwork today, you will receive an invoice from [facility]. Please contact [facility] at [phone] with questions or concerns regarding your invoice.   Our billing staff will not be able to assist you with questions regarding bills from these companies.  You will be contacted with the lab results as soon as they are available. The fastest way to get your results is to activate your My Chart account. Instructions are located on the last page of this paperwork. If you have not heard from us regarding the results in 2 weeks, please contact this office.

## 2017-06-19 ENCOUNTER — Other Ambulatory Visit: Payer: Self-pay | Admitting: Physician Assistant

## 2017-06-19 DIAGNOSIS — R12 Heartburn: Secondary | ICD-10-CM

## 2017-06-28 ENCOUNTER — Other Ambulatory Visit: Payer: Self-pay

## 2017-06-28 ENCOUNTER — Ambulatory Visit (INDEPENDENT_AMBULATORY_CARE_PROVIDER_SITE_OTHER): Payer: BLUE CROSS/BLUE SHIELD | Admitting: Physician Assistant

## 2017-06-28 ENCOUNTER — Encounter: Payer: Self-pay | Admitting: Physician Assistant

## 2017-06-28 ENCOUNTER — Ambulatory Visit (INDEPENDENT_AMBULATORY_CARE_PROVIDER_SITE_OTHER): Payer: BLUE CROSS/BLUE SHIELD

## 2017-06-28 VITALS — BP 142/76 | HR 71 | Temp 98.0°F | Resp 16 | Ht 69.0 in | Wt 213.0 lb

## 2017-06-28 DIAGNOSIS — M5030 Other cervical disc degeneration, unspecified cervical region: Secondary | ICD-10-CM | POA: Diagnosis not present

## 2017-06-28 DIAGNOSIS — R202 Paresthesia of skin: Secondary | ICD-10-CM

## 2017-06-28 DIAGNOSIS — M47812 Spondylosis without myelopathy or radiculopathy, cervical region: Secondary | ICD-10-CM | POA: Diagnosis not present

## 2017-06-28 NOTE — Progress Notes (Signed)
06/28/2017 1:33 PM   DOB: July 02, 1955 / MRN: [id number]  SUBJECTIVE:  Chris [name] is a 62 y.o. male presenting for left posterior hand paresthesia times 4 days.  Patient has several risk factors for stroke including history of myocardial infarction.  The chart shows that he has a 90-pack-year history of smoking he is compliant with aspirin, Plavix, statin, hypertensive medications.  Denies weakness, gait disturbance speech difficulty.  He is very physically active and includes lots of bowling and golfing.  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  He denies neck pain today.  Immunization History  Administered Date(s) Administered  . Influenza,inj,Quad PF,6+ Mos 04/03/2013, 12/25/2016  . Influenza-Unspecified 02/20/2014  . Pneumococcal Conjugate-13 02/20/2014  . Tdap 03/23/2005, 08/12/2015   Health Maintenance Due  Topic Date Due  . PNEUMOCOCCAL POLYSACCHARIDE VACCINE (1) 09/07/1957  . FOOT EXAM  09/07/1965  . OPHTHALMOLOGY EXAM  09/07/1965  . URINE MICROALBUMIN  09/07/1965      He is allergic to ace inhibitors and ropinirole hcl.   He  has a past medical history of Acute MI, inferior wall, initial episode of care [facility]) (2009), Anemia, Arthritis, Coronary atherosclerosis of unspecified type of vessel, native or graft, GERD (gastroesophageal reflux disease), High cholesterol, Hypertension, Seasonal allergies, and Sleep apnea.    He  reports that he quit smoking about 9 years ago. His smoking use included cigarettes. He has a 90.00 pack-year smoking history. He has never used smokeless tobacco. He reports that he drinks about 3.6 oz of alcohol per week. He reports that he has current or past drug history. Drug: Amphetamines. He  reports that he currently engages in sexual activity. The patient  has a past surgical history that includes ORIF ankle fracture (Left, 11/27/2015); laparoscopic appendectomy (N/A, 09/16/2016); Appendectomy; Cholecystectomy  open (~ 2008); Fracture surgery; Coronary angioplasty with stent (2009); Laparoscopic incisional / umbilical / ventral hernia repair (11/30/2016); Hernia repair; Ventral hernia repair (N/A, 11/30/2016); and Insertion of mesh (N/A, 11/30/2016).  His family history includes CAD in his brother; Heart disease in his mother; Hypertension in his father; Prostate cancer in his brother.  Review of Systems  Constitutional: Negative for chills, diaphoresis and fever.  Eyes: Negative.   Respiratory: Negative for cough, hemoptysis, sputum production, shortness of breath and wheezing.   Cardiovascular: Negative for chest pain, orthopnea and leg swelling.  Gastrointestinal: Negative for abdominal pain, blood in stool, constipation, diarrhea, heartburn, melena, nausea and vomiting.  Genitourinary: Negative for flank pain.  Skin: Negative for rash.  Neurological: Positive for tingling. Negative for dizziness, tremors, sensory change, speech change, focal weakness and headaches.    The problem list and medications were reviewed and updated by myself where necessary and exist elsewhere in the encounter.   OBJECTIVE:  BP (!) 142/76   Pulse 71   Temp 98 F (36.7 C) (Oral)   Resp 16   Ht 5\' 9"  (1.753 m)   Wt 213 lb (96.6 kg)   SpO2 98%   BMI 31.45 kg/m   Physical Exam  Constitutional: He is oriented to person, place, and time. He appears well-developed. He is active and cooperative.  Non-toxic appearance.  Cardiovascular: Normal rate, regular rhythm, S1 normal, S2 normal, normal heart sounds, intact distal pulses and normal pulses. Exam reveals no gallop and no friction rub.  No murmur heard. Pulmonary/Chest: Effort normal. No stridor. No tachypnea. No respiratory distress. He has no wheezes. He has no rales.  Abdominal: He exhibits no distension.  Musculoskeletal: He exhibits no edema.  Neurological: He is alert and oriented to person, place, and time. He has normal strength. He is not disoriented. He  displays no atrophy, no tremor and normal reflexes. No cranial nerve deficit or sensory deficit. He exhibits normal muscle tone. He displays no seizure activity. Coordination and gait normal. GCS eye subscore is 4. GCS verbal subscore is 5. GCS motor subscore is 6.  Skin: Skin is warm and dry. He is not diaphoretic. No pallor.  Vitals reviewed.   No results found for this or any previous visit (from the past 72 hour(s)).  Dg Cervical Spine 2 Or 3 Views  Result Date: 06/28/2017 CLINICAL DATA:  LEFT-sided radiculopathy. EXAM: CERVICAL SPINE - 2-3 VIEW COMPARISON:  01/09/2017. FINDINGS: 2 mm anterolisthesis C4-5 is facet mediated. Similarly 1 mm anterolisthesis C5-6 is facet mediated. Mild disc space narrowing at both levels. No oblique views were obtained to evaluate for foraminal narrowing. AP and odontoid views are unremarkable. Small avulsion/calcification adjacent to C7 spinous process, stable. Grossly similar appearance to priors. IMPRESSION: No acute findings.  Degenerative change as described. Electronically Signed   By: Elsie Stain M.D.   On: 06/28/2017 12:32    ASSESSMENT AND PLAN:  Landen was seen today for tingling.  Diagnoses and all orders for this visit:  Paresthesia: Normal neurological exam in all respects.  This does not represent cerebral ischemia.  -     DG Cervical Spine 2 or 3 views; Future  DDD (degenerative disc disease), cervical: Continuum of disease progression discussed with patient and in the grand scheme of his health these symptoms are mild. Advised tylenol if he begins having neck pain.  Encouraged him to remain physically active and to continue enjoying his hobbies.  RTC for medication options should the symptoms worsen.     The patient is advised to call or return to clinic if he does not see an improvement in symptoms, or to seek the care of the closest emergency department if he worsens with the above plan.   [name], MHS, PA-C Primary Care at  [facility] 06/28/2017 1:33 PM

## 2017-06-28 NOTE — Patient Instructions (Signed)
     IF you received an x-ray today, you will receive an invoice from [facility]. Please contact [facility] at [phone] with questions or concerns regarding your invoice.   IF you received labwork today, you will receive an invoice from [facility]. Please contact [facility] at [phone] with questions or concerns regarding your invoice.   Our billing staff will not be able to assist you with questions regarding bills from these companies.  You will be contacted with the lab results as soon as they are available. The fastest way to get your results is to activate your My Chart account. Instructions are located on the last page of this paperwork. If you have not heard from us regarding the results in 2 weeks, please contact this office.     

## 2017-07-02 ENCOUNTER — Other Ambulatory Visit: Payer: Self-pay | Admitting: Physician Assistant

## 2017-07-02 DIAGNOSIS — R12 Heartburn: Secondary | ICD-10-CM

## 2017-07-02 NOTE — Telephone Encounter (Signed)
Refill request for Pantoprazole ( Protonix) 40 MG. Pended and routed due to: -On 06/19/17, medication was refused stating he needed an appointment. He was seen in the office on 06/18/17. Unsure about continuing refill.  PCP Deliah BostonMichael Clark, PA LOV medication requested was addressed-01/09/17

## 2017-07-11 ENCOUNTER — Other Ambulatory Visit: Payer: Self-pay | Admitting: Physician Assistant

## 2017-07-11 DIAGNOSIS — R12 Heartburn: Secondary | ICD-10-CM

## 2017-07-12 NOTE — Telephone Encounter (Signed)
pantoprazole refill Last OV: 04/15/17 Last Refill:12/07/16 #30 tabs 12/07/16 (end date 01/06/17) [facility] [id number] [place]

## 2017-07-13 NOTE — Telephone Encounter (Signed)
Pt's med was refused last week stating he needs an appt. He states he was just in the office on 06/28/17 and doesn't think he needs to come back in.

## 2017-07-13 NOTE — Telephone Encounter (Signed)
Pt calling checking on refill status. He also requested refill last week. Pt has been out of medication since last week.

## 2017-07-14 ENCOUNTER — Encounter: Payer: Self-pay | Admitting: Physician Assistant

## 2017-07-14 ENCOUNTER — Telehealth: Payer: Self-pay | Admitting: Physician Assistant

## 2017-07-14 ENCOUNTER — Ambulatory Visit: Payer: BLUE CROSS/BLUE SHIELD | Admitting: Physician Assistant

## 2017-07-14 ENCOUNTER — Other Ambulatory Visit: Payer: Self-pay

## 2017-07-14 VITALS — BP 104/68 | HR 69 | Temp 97.8°F | Resp 18 | Ht 69.0 in | Wt 213.2 lb

## 2017-07-14 DIAGNOSIS — E119 Type 2 diabetes mellitus without complications: Secondary | ICD-10-CM | POA: Diagnosis not present

## 2017-07-14 DIAGNOSIS — R12 Heartburn: Secondary | ICD-10-CM | POA: Diagnosis not present

## 2017-07-14 LAB — POCT GLYCOSYLATED HEMOGLOBIN (HGB A1C): HEMOGLOBIN A1C: 6.3

## 2017-07-14 MED ORDER — PANTOPRAZOLE SODIUM 40 MG PO TBEC: 40.0000 mg | DELAYED_RELEASE_TABLET | Freq: Every day | ORAL | 0 refills | Status: DC

## 2017-07-14 NOTE — Progress Notes (Signed)
07/14/2017 10:01 AM   DOB: 1955/04/12 / MRN: [id number]  SUBJECTIVE:  Chris [name] is a 62 y.o. male presenting for med check.  Patient is compliant with all of his medications at this time which include Plavix, 81 mg aspirin, statin, chlorthalidone, metoprolol, pantoprazole.  He feels well today and denies complaint.He would like refills of his pantoprazole today.  He takes this as needed now.  Denies dysphagia, odynophagia.   Patient Active Problem List   Diagnosis Date Noted  . GERD (gastroesophageal reflux disease) 12/05/2016  . Ventral hernia without obstruction or gangrene 11/30/2016  . History of MI (myocardial infarction) 05/21/2014  . ED (erectile dysfunction) 07/25/2012  . RESTLESS LEGS SYNDROME 02/20/2010  . Hyperlipidemia 02/19/2010  . OBSTRUCTIVE SLEEP APNEA 02/19/2010  . CAD (coronary artery disease) 02/19/2010   He is allergic to ace inhibitors and ropinirole hcl.   He  has a past medical history of Acute MI, inferior wall, initial episode of care [facility]) (2009), Anemia, Arthritis, Coronary atherosclerosis of unspecified type of vessel, native or graft, GERD (gastroesophageal reflux disease), High cholesterol, Hypertension, Seasonal allergies, and Sleep apnea.    He  reports that he quit smoking about 9 years ago. His smoking use included cigarettes. He has a 90.00 pack-year smoking history. He has never used smokeless tobacco. He reports that he drinks about 3.6 oz of alcohol per week. He reports that he has current or past drug history. Drug: Amphetamines. He  reports that he currently engages in sexual activity. The patient  has a past surgical history that includes ORIF ankle fracture (Left, 11/27/2015); laparoscopic appendectomy (N/A, 09/16/2016); Appendectomy; Cholecystectomy open (~ 2008); Fracture surgery; Coronary angioplasty with stent (2009); Laparoscopic incisional / umbilical / ventral hernia repair (11/30/2016); Hernia repair; Ventral hernia repair (N/A,  11/30/2016); and Insertion of mesh (N/A, 11/30/2016).  His family history includes CAD in his brother; Heart disease in his mother; Hypertension in his father; Prostate cancer in his brother.  Review of Systems  Constitutional: Negative for chills, diaphoresis and fever.  Eyes: Negative.   Respiratory: Negative for cough, hemoptysis, sputum production, shortness of breath and wheezing.   Cardiovascular: Negative for chest pain, orthopnea and leg swelling.  Gastrointestinal: Negative for abdominal pain, blood in stool, constipation, diarrhea, heartburn, melena, nausea and vomiting.  Genitourinary: Negative for dysuria, flank pain, frequency, hematuria and urgency.  Skin: Negative for rash.  Neurological: Negative for dizziness, sensory change, speech change, focal weakness and headaches.    The problem list and medications were reviewed and updated by myself where necessary and exist elsewhere in the encounter.   OBJECTIVE:  BP 104/68   Pulse 69   Temp 97.8 F (36.6 C) (Oral)   Resp 18   Ht 5\' 9"  (1.753 m)   Wt 213 lb 3.2 oz (96.7 kg)   SpO2 98%   BMI 31.48 kg/m   Physical Exam  Constitutional: He is oriented to person, place, and time. He appears well-developed. He does not appear ill.  Eyes: Pupils are equal, round, and reactive to light. Conjunctivae and EOM are normal.  Neck: Normal range of motion. Neck supple. Normal carotid pulses, no hepatojugular reflux and no JVD present. Muscular tenderness (left sided) present. No tracheal tenderness present. Carotid bruit is not present. No tracheal deviation present. No thyroid mass and no thyromegaly present.  Cardiovascular: Normal rate, regular rhythm, S1 normal, S2 normal, normal heart sounds, intact distal pulses and normal pulses. Exam reveals no gallop and no friction rub.  No  murmur heard. Pulmonary/Chest: Effort normal. No stridor. No respiratory distress. He has no wheezes. He has no rales.  Abdominal: He exhibits no  distension.  Musculoskeletal: Normal range of motion. He exhibits no edema.  Lymphadenopathy:       Head (right side): No submental, no submandibular, no tonsillar, no preauricular and no posterior auricular adenopathy present.       Head (left side): No submental, no submandibular, no tonsillar, no preauricular and no posterior auricular adenopathy present.  Neurological: He is alert and oriented to person, place, and time. No cranial nerve deficit. Coordination normal.  Skin: Skin is warm and dry. He is not diaphoretic.  Psychiatric: He has a normal mood and affect.  Nursing note and vitals reviewed.   Lab Results  Component Value Date   WBC 6.7 05/05/2017   HGB 14.4 05/05/2017   HCT 43.2 (A) 05/05/2017   MCV 85.0 05/05/2017   PLT 389 12/07/2016    Lab Results  Component Value Date   CREATININE 0.87 04/15/2017   BUN 14 04/15/2017   NA 140 04/15/2017   K 3.7 04/15/2017   CL 96 04/15/2017   CO2 25 04/15/2017    Lab Results  Component Value Date   ALT 36 04/15/2017   AST 31 04/15/2017   ALKPHOS 88 04/15/2017   BILITOT 0.5 04/15/2017    Lab Results  Component Value Date   TSH 1.796 03/13/2015    Lab Results  Component Value Date   HGBA1C 6.3 07/14/2017    Lab Results  Component Value Date   CHOL 111 05/03/2017   HDL 32 (L) 05/03/2017   LDLCALC 38 05/03/2017   TRIG 205 (H) 05/03/2017   CHOLHDL 3.5 05/03/2017     No results found.  ASSESSMENT AND PLAN:  Aristotelis was seen today for medication refill.  Diagnoses and all orders for this visit:  Well controlled diabetes mellitus (HCC) -     POCT glycosylated hemoglobin (Hb A1C) -     Renal Function Panel  Heartburn -     pantoprazole (PROTONIX) 40 MG tablet; Take 1 tablet (40 mg total) by mouth daily. Take 30 minutes before breakfast as needed for heart burn. -     Care order/instruction:    The patient is advised to call or return to clinic if he does not see an improvement in symptoms, or to seek the  care of the closest emergency department if he worsens with the above plan.   Deliah BostonMichael Clark, MHS, PA-C [facility] Care at [facility] 07/14/2017 10:01 AM

## 2017-07-14 NOTE — Patient Instructions (Signed)
     IF you received an x-ray today, you will receive an invoice from [facility]. Please contact [facility] at [phone] with questions or concerns regarding your invoice.   IF you received labwork today, you will receive an invoice from [facility]. Please contact [facility] at [phone] with questions or concerns regarding your invoice.   Our billing staff will not be able to assist you with questions regarding bills from these companies.  You will be contacted with the lab results as soon as they are available. The fastest way to get your results is to activate your My Chart account. Instructions are located on the last page of this paperwork. If you have not heard from us regarding the results in 2 weeks, please contact this office.     

## 2017-07-14 NOTE — Telephone Encounter (Signed)
Opened in error. Deliah BostonMichael Clark, MS, PA-C 1:33 PM, 07/14/2017

## 2017-07-14 NOTE — Progress Notes (Signed)
fin

## 2017-07-15 LAB — RENAL FUNCTION PANEL
ALBUMIN: 4.4 g/dL (ref 3.6–4.8)
BUN / CREAT RATIO: 10 (ref 10–24)
BUN: 9 mg/dL (ref 8–27)
CHLORIDE: 97 mmol/L (ref 96–106)
CO2: 27 mmol/L (ref 20–29)
CREATININE: 0.88 mg/dL (ref 0.76–1.27)
Calcium: 9.9 mg/dL (ref 8.6–10.2)
GFR calc non Af Amer: 93 mL/min/{1.73_m2} (ref >59)
GFR, EST AFRICAN AMERICAN: 107 mL/min/{1.73_m2} (ref >59)
Glucose: 110 mg/dL — ABNORMAL HIGH (ref 65–99)
Phosphorus: 3.3 mg/dL (ref 2.5–4.5)
Potassium: 4 mmol/L (ref 3.5–5.2)
Sodium: 138 mmol/L (ref 134–144)

## 2017-08-24 ENCOUNTER — Other Ambulatory Visit: Payer: Self-pay

## 2017-08-24 ENCOUNTER — Encounter: Payer: Self-pay | Admitting: Family Medicine

## 2017-08-24 ENCOUNTER — Ambulatory Visit: Payer: BLUE CROSS/BLUE SHIELD | Admitting: Family Medicine

## 2017-08-24 VITALS — BP 138/80 | HR 75 | Temp 98.3°F | Resp 17 | Ht 69.0 in | Wt 216.0 lb

## 2017-08-24 DIAGNOSIS — J392 Other diseases of pharynx: Secondary | ICD-10-CM | POA: Diagnosis not present

## 2017-08-24 DIAGNOSIS — J309 Allergic rhinitis, unspecified: Secondary | ICD-10-CM

## 2017-08-24 MED ORDER — FLUTICASONE PROPIONATE 50 MCG/ACT NA SUSP: 1.0000 | Freq: Every day | NASAL | 1 refills | Status: DC

## 2017-08-24 NOTE — Progress Notes (Signed)
Subjective:  By signing my name below, I, Chris [name], attest that this documentation has been prepared under the direction and in the presence of Chris Staggers, MD. Electronically Signed: Stann [name], Scribe. 08/24/2017 , 6:11 PM .  Patient was seen in Room 3 .   Patient ID: Chris [name], male    DOB: 09/10/1955, 62 y.o.   MRN: [id number] Chief Complaint  Patient presents with  . Dry throat    burping give it some relief. feels like it is about to close when his head falls down. (going on since over a year ago) worse last 3 days  . Left arm tingling     saw Chris [name] about the tingling arm from hand to formarm.    HPI Chris [name] is a 62 y.o. male Patient presents for 2 concerns today, dry throat and left arm tingling. He has a history of CAD, and OSA; most recently seen by Chris [name] for diabetes. He was also seen in early April for dysesthesias of his left hand, normal neuro exam at that time. Thought to have cervical disc disease as cause, recommended tylenol.   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   Dry throat He's had sensation in throat over past year. He states when he falls asleep, he'll lean his head forward. When he wakes up, he describes sensation of unable to breathe through his throat. He noticed this sensation had started since June 2018 when he had an appendicitis but worsened in the last 3 days. Since then, it's been more irritated. He saw Chris [name] for this issue a few times, seen in Oct 2018 for similar issues. He denies any fever.   He was on a PPI. He denies trouble breathing, or chest pains. He was in Jan for 2 day history of dry mouth, feeling to clear his mouth; notes about the same as January. He's taken 2 benadryl this afternoon and the symptom has eased up. He denies any new medications recently. He isn't on an ACE inhibitor. He denies facial swelling or tongue swelling. He hasn't taken allergy medications recently.  He's been sneezing recently with some coughing fits. In Sept 2017, he had reported throat irritation. He hasn't seen GI in over 20 years. He denies any unexplained weight loss or night sweats.   OSA He isn't on CPAP for his OSA. He had sleep test done years ago. He usually doesn't snore. He usually doesn't feel tired during the day.   Tingling - left hand He states having intermittent tingling in his left hand, that goes up to his left forearm. He denies chest pain or pain shooting up or down his arm. He denies chest pain, chest pressure, or chest tightness.   History of CAD He's followed by cardiologist, Dr. Elease [name], inferior wall MI in 2009, PTCA of right coronary artery. When seen in February, he was doing well without chest pain or dyspnea. He's been working out on elliptical without angina. He had a 2D echo with EF 60-65%, normal wall motion in Sept 2018, grade 1 diastolic dysfunction.    Patient Active Problem List   Diagnosis Date Noted  . GERD (gastroesophageal reflux disease) 12/05/2016  . Ventral hernia without obstruction or gangrene 11/30/2016  . History of MI (myocardial infarction) 05/21/2014  . ED (erectile dysfunction) 07/25/2012  . RESTLESS LEGS SYNDROME 02/20/2010  . Hyperlipidemia 02/19/2010  . OBSTRUCTIVE SLEEP APNEA 02/19/2010  . CAD (coronary artery disease) 02/19/2010  Past Medical History:  Diagnosis Date  . Acute MI, inferior wall, initial episode of care [facility]) 2009  . Anemia   . Arthritis   . Coronary atherosclerosis of unspecified type of vessel, native or graft   . GERD (gastroesophageal reflux disease)   . High cholesterol   . Hypertension   . Seasonal allergies   . Sleep apnea    "I've never worn a mask" (09/17/2016)   Past Surgical History:  Procedure Laterality Date  . APPENDECTOMY    . CHOLECYSTECTOMY OPEN  ~ 2008   "it exploded"  . CORONARY ANGIOPLASTY WITH STENT PLACEMENT  2009    2.75 x 28 mm Promus stent to the RCA  . FRACTURE SURGERY      . HERNIA REPAIR    . INSERTION OF MESH N/A 11/30/2016   Procedure: INSERTION OF MESH;  Surgeon: Chris Norman, MD;  Location: [facility] OR;  Service: General;  Laterality: N/A;  . LAPAROSCOPIC APPENDECTOMY N/A 09/16/2016   Procedure: APPENDECTOMY LAPAROSCOPIC;  Surgeon: Chris Norman, MD;  Location: [facility] OR;  Service: General;  Laterality: N/A;  . LAPAROSCOPIC INCISIONAL / UMBILICAL / VENTRAL HERNIA REPAIR  11/30/2016   w/repair serosal tear  . ORIF ANKLE FRACTURE Left 11/27/2015   Procedure: OPEN REDUCTION INTERNAL FIXATION (ORIF) ANKLE FRACTURE;  Surgeon: [name], MD;  Location: [facility];  Service: Orthopedics;  Laterality: Left;  Chris [name] VENTRAL HERNIA REPAIR N/A 11/30/2016   Procedure: LAPAROSCOPIC VENTRAL HERNIA REPAIR AND  REPAIR OF SEROSAL TEAR;  Surgeon: Chris Norman, MD;  Location: [facility] OR;  Service: General;  Laterality: N/A;   Allergies  Allergen Reactions  . Ace Inhibitors Rash  . Ropinirole Hcl Rash   Prior to Admission medications   Medication Sig Start Date End Date Taking? Authorizing Provider  aspirin 81 MG tablet Take 81 mg by mouth daily.     [provider]  atorvastatin (LIPITOR) 40 MG tablet TAKE 1 TABLET (40 MG TOTAL) BY MOUTH DAILY. 12/08/16   Nahser, Deloris Ping, MD  chlorthalidone (HYGROTON) 25 MG tablet Take 1 tablet (25 mg total) by mouth daily. 05/12/17   Ofilia Neas, PA-C  clopidogrel (PLAVIX) 75 MG tablet Take 75 mg by mouth daily.    [provider]  metFORMIN (GLUCOPHAGE) 500 MG tablet Take 1 tablet (500 mg total) by mouth daily with breakfast. 04/15/17   Wallis Bamberg, PA-C  metoprolol succinate (TOPROL-XL) 25 MG 24 hr tablet TAKE 1/2 TABLET BY MOUTH TWICE A DAY 03/08/17   Nahser, Deloris Ping, MD  pantoprazole (PROTONIX) 40 MG tablet Take 1 tablet (40 mg total) by mouth daily. Take 30 minutes before breakfast as needed for heart burn. 07/14/17 10/12/17  Ofilia Neas, PA-C   Social History   Socioeconomic History  . Marital status: Married     Spouse name: Not on file  . Number of children: Not on file  . Years of education: Not on file  . Highest education level: Not on file  Occupational History  . Occupation: Production designer, theatre/television/film - Engineer, structural  Social Needs  . Financial resource strain: Not on file  . Food insecurity:    Worry: Not on file    Inability: Not on file  . Transportation needs:    Medical: Not on file    Non-medical: Not on file  Tobacco Use  . Smoking status: Former Smoker    Packs/day: 2.50    Years: 36.00    Pack years: 90.00    Types: Cigarettes  Last attempt to quit: 08/22/2007    Years since quitting: 10.0  . Smokeless tobacco: Never Used  Substance and Sexual Activity  . Alcohol use: Yes    Alcohol/week: 3.6 oz    Types: 6 Cans of beer per week  . Drug use: Yes    Types: Amphetamines  . Sexual activity: Yes  Lifestyle  . Physical activity:    Days per week: Not on file    Minutes per session: Not on file  . Stress: Not on file  Relationships  . Social connections:    Talks on phone: Not on file    Gets together: Not on file    Attends religious service: Not on file    Active member of club or organization: Not on file    Attends meetings of clubs or organizations: Not on file    Relationship status: Not on file  . Intimate partner violence:    Fear of current or ex partner: Not on file    Emotionally abused: Not on file    Physically abused: Not on file    Forced sexual activity: Not on file  Other Topics Concern  . Not on file  Social History Narrative   Lives with wife. Exercises occasionally.   Review of Systems  Constitutional: Negative for fatigue and unexpected weight change.  HENT: Positive for sneezing.        Throat irritation  Eyes: Negative for visual disturbance.  Respiratory: Positive for cough. Negative for chest tightness and shortness of breath.   Cardiovascular: Negative for chest pain, palpitations and leg swelling.  Gastrointestinal: Negative for  abdominal pain and blood in stool.  Musculoskeletal: Positive for myalgias.  Neurological: Negative for dizziness, light-headedness and headaches.       Objective:   Physical Exam  Constitutional: He is oriented to person, place, and time. He appears well-developed and well-nourished. No distress.  HENT:  Head: Normocephalic and atraumatic.  Nose: Mucosal edema (minimal) present.  Mouth/Throat: Uvula is midline and mucous membranes are normal. No uvula swelling. No oropharyngeal exudate.  Oropharynx: tonsillar area normal, no postnasal drip Nasal: septum intact, no active bleeding  Eyes: Pupils are equal, round, and reactive to light. EOM are normal.  Neck: Neck supple.  No soft tissue masses, no apparent masses appreciated  Cardiovascular: Normal rate.  Pulmonary/Chest: Effort normal. No respiratory distress.  Musculoskeletal: Normal range of motion.  Some tingling of left hand with neck extended  Lymphadenopathy:    He has no cervical adenopathy.  Neurological: He is alert and oriented to person, place, and time.  Skin: Skin is warm and dry.  Psychiatric: He has a normal mood and affect. His behavior is normal.  Nursing note and vitals reviewed.   Vitals:   08/24/17 1736 08/24/17 1745  BP: (!) 144/82 138/80  Pulse: 75   Resp: 17   Temp: 98.3 F (36.8 C)   TempSrc: Oral   SpO2: 98%   Weight: 216 lb (98 kg)   Height: 5\' 9"  (1.753 m)        Assessment & Plan:   JERRID FORGETTE is a 62 y.o. male Throat irritation - Plan: fluticasone (FLONASE) 50 MCG/ACT nasal spray  Allergic rhinitis, unspecified seasonality, unspecified trigger - Plan: fluticasone (FLONASE) 50 MCG/ACT nasal spray  Based on review of chart/history it appears he has had some ongoing throat irritation for some time with intermittent flares.  No stridor on exam, clearing secretions normally, reassuring exam.  Differential includes some postnasal drip  from allergic rhinitis, versus irritation from  laryngeal pharyngeal reflux.  Less likely mass/obstruction.  Denies any concurrent chest symptoms, unlikely cardiac cause.  He denies any heartburn at this time and is compliant with proton pump inhibitor.  With some improvement taking Benadryl, allergic cause also possible.  No signs of angioedema at this time.  -Initially try restarting less sedating antihistamine such as Claritin, Zyrtec or Allegra once per day in place of Benadryl.  -Start Flonase nasal spray with correct technique discussed  -If not improving within the next week advised to let me know and I will refer him to ear nose and throat for evaluation and possible laryngoscopy.  RTC/ER precautions if acute worsening  For left arm tingling, noted with neck extension on brief exam in office, possible radicular symptoms from neck as previously discussed with Chris BostonMichael Clark.  Advised to follow-up to discuss next step.  Meds ordered this encounter  Medications  . fluticasone (FLONASE) 50 MCG/ACT nasal spray    Sig: Place 1 spray into both nostrils daily.    Dispense:  16 g    Refill:  1   Patient Instructions   Try restarting antihistamine such as claritin, zyrtec or allegra once per day instead of benadryl for now.  Flonase nasal spray once per day.  If the throat symptoms are not improving in the next week, I can refer you to ear nose and throat specialist. Return to the clinic or go to the nearest emergency room if any of your symptoms worsen or new symptoms occur.  Please follow up with Casimiro NeedleMichael to discuss your hand symptoms further, but likely is coming from your neck as it occurred with extending your neck today.      IF you received an x-ray today, you will receive an invoice from [facility]. Please contact [facility] at [phone] with questions or concerns regarding your invoice.   IF you received labwork today, you will receive an invoice from [facility]. Please contact [facility] at [phone] with  questions or concerns regarding your invoice.   Our billing staff will not be able to assist you with questions regarding bills from these companies.  You will be contacted with the lab results as soon as they are available. The fastest way to get your results is to activate your My Chart account. Instructions are located on the last page of this paperwork. If you have not heard from us regarding the results in 2 weeks, please contact this office.       I personally performed the services described in this documentation, which was scribed in my presence. The recorded information has been reviewed and considered for accuracy and completeness, addended by me as needed, and agree with information above.  Signed,   Chris StaggersJeffrey Isais Klipfel, MD [facility] at [facility].  08/27/17 10:03 AM

## 2017-08-24 NOTE — Patient Instructions (Addendum)
Try restarting antihistamine such as claritin, zyrtec or allegra once per day instead of benadryl for now.  Flonase nasal spray once per day.  If the throat symptoms are not improving in the next week, I can refer you to ear nose and throat specialist. Return to the clinic or go to the nearest emergency room if any of your symptoms worsen or new symptoms occur.  Please follow up with Casimiro NeedleMichael to discuss your hand symptoms further, but likely is coming from your neck as it occurred with extending your neck today.      IF you received an x-ray today, you will receive an invoice from [facility]. Please contact [facility] at [phone] with questions or concerns regarding your invoice.   IF you received labwork today, you will receive an invoice from [facility]. Please contact [facility] at [phone] with questions or concerns regarding your invoice.   Our billing staff will not be able to assist you with questions regarding bills from these companies.  You will be contacted with the lab results as soon as they are available. The fastest way to get your results is to activate your My Chart account. Instructions are located on the last page of this paperwork. If you have not heard from us regarding the results in 2 weeks, please contact this office.

## 2017-10-06 ENCOUNTER — Other Ambulatory Visit: Payer: Self-pay | Admitting: Urgent Care

## 2017-10-13 ENCOUNTER — Telehealth: Payer: Self-pay | Admitting: Family Medicine

## 2017-10-13 DIAGNOSIS — J392 Other diseases of pharynx: Secondary | ICD-10-CM

## 2017-10-13 NOTE — Telephone Encounter (Signed)
Copied from CRM [phone]. Topic: General - Other >> Oct 13, 2017  1:09 PM Trula SladeWalter, Linda F wrote: Reason for CRM:   Patient would like a return call from the provider concerning his throat.

## 2017-10-20 ENCOUNTER — Telehealth: Payer: Self-pay | Admitting: Physician Assistant

## 2017-10-20 DIAGNOSIS — R12 Heartburn: Secondary | ICD-10-CM

## 2017-10-20 NOTE — Telephone Encounter (Signed)
Pt checking status on receiving a call regarding his throat.

## 2017-10-20 NOTE — Telephone Encounter (Signed)
Copied from CRM [phone]. Topic: Quick Communication - Rx Refill/Question >> Oct 20, 2017  2:22 PM Arlyss Gandyichardson, Tamyrah Burbage N, NT wrote: Medication: pantoprazole (PROTONIX) 40 MG tablet  Has the patient contacted their pharmacy? Yes.   (Agent: If no, request that the patient contact the pharmacy for the refill.) (Agent: If yes, when and what did the pharmacy advise?)  Preferred Pharmacy (with phone number or street name): [id number], [place] - [address] [phone] (Phone) [phone] (Fax)      Agent: Please be advised that RX refills may take up to 3 business days. We ask that you follow-up with your pharmacy.

## 2017-10-21 MED ORDER — PANTOPRAZOLE SODIUM 40 MG PO TBEC: 40.0000 mg | DELAYED_RELEASE_TABLET | Freq: Every day | ORAL | 0 refills | Status: DC

## 2017-10-21 NOTE — Telephone Encounter (Signed)
Pt was under impression that a referral for ENT was going to be entered at his last appt with you but has not heard anything.  Unable to find a referral to ENT/scope.  Pt also requested call back from you.  Attempted to obtain add'l info re what he needs to speak with you about but pt only stated it was regarding his throat and insisted on speaking with you directly.  Thank you.

## 2017-10-21 NOTE — Telephone Encounter (Signed)
Please see 08/24/17 office visit plan: " -Initially try restarting less sedating antihistamine such as Claritin, Zyrtec or Allegra once per day in place of Benadryl.             -Start Flonase nasal spray with correct technique discussed             -If not improving within the next week advised to let me know and I will refer him to ear nose and throat for evaluation and possible laryngoscopy.  RTC/ER precautions if acute worsening"  If he is still having throat irritation that did not improve with prior treatment, I am happy to refer him to ENT and referral has now been  placed. If any new or worsening symptoms, let me know as may need to discuss in office. Thanks.

## 2017-10-26 NOTE — Telephone Encounter (Signed)
Pt notified and verbalized understanding.

## 2017-11-14 ENCOUNTER — Other Ambulatory Visit: Payer: Self-pay | Admitting: Family Medicine

## 2017-11-14 DIAGNOSIS — J309 Allergic rhinitis, unspecified: Secondary | ICD-10-CM

## 2017-11-14 DIAGNOSIS — J392 Other diseases of pharynx: Secondary | ICD-10-CM

## 2017-12-01 ENCOUNTER — Encounter: Payer: Self-pay | Admitting: Cardiovascular Disease

## 2017-12-01 ENCOUNTER — Ambulatory Visit (INDEPENDENT_AMBULATORY_CARE_PROVIDER_SITE_OTHER): Payer: BLUE CROSS/BLUE SHIELD | Admitting: Cardiovascular Disease

## 2017-12-01 VITALS — BP 148/78 | HR 61 | Ht 69.0 in | Wt 224.0 lb

## 2017-12-01 DIAGNOSIS — I2510 Atherosclerotic heart disease of native coronary artery without angina pectoris: Secondary | ICD-10-CM

## 2017-12-01 DIAGNOSIS — I10 Essential (primary) hypertension: Secondary | ICD-10-CM | POA: Diagnosis not present

## 2017-12-01 DIAGNOSIS — E785 Hyperlipidemia, unspecified: Secondary | ICD-10-CM | POA: Diagnosis not present

## 2017-12-01 LAB — BASIC METABOLIC PANEL
BUN / CREAT RATIO: 11 (ref 10–24)
BUN: 10 mg/dL (ref 8–27)
CHLORIDE: 97 mmol/L (ref 96–106)
CO2: 23 mmol/L (ref 20–29)
Calcium: 9.4 mg/dL (ref 8.6–10.2)
Creatinine, Ser: 0.89 mg/dL (ref 0.76–1.27)
GFR calc Af Amer: 106 mL/min/{1.73_m2} (ref >59)
GFR calc non Af Amer: 92 mL/min/{1.73_m2} (ref >59)
GLUCOSE: 114 mg/dL — AB (ref 65–99)
Potassium: 3.7 mmol/L (ref 3.5–5.2)
Sodium: 138 mmol/L (ref 134–144)

## 2017-12-01 LAB — HEPATIC FUNCTION PANEL
ALBUMIN: 4.2 g/dL (ref 3.6–4.8)
ALT: 47 IU/L — ABNORMAL HIGH (ref 0–44)
AST: 39 IU/L (ref 0–40)
Alkaline Phosphatase: 83 IU/L (ref 39–117)
BILIRUBIN, DIRECT: 0.16 mg/dL (ref 0.00–0.40)
Bilirubin Total: 0.5 mg/dL (ref 0.0–1.2)
TOTAL PROTEIN: 6.7 g/dL (ref 6.0–8.5)

## 2017-12-01 LAB — LIPID PANEL
CHOL/HDL RATIO: 3.7 ratio (ref 0.0–5.0)
Cholesterol, Total: 130 mg/dL (ref 100–199)
HDL: 35 mg/dL — AB (ref >39)
LDL Calculated: 54 mg/dL (ref 0–99)
Triglycerides: 203 mg/dL — ABNORMAL HIGH (ref 0–149)
VLDL Cholesterol Cal: 41 mg/dL — ABNORMAL HIGH (ref 5–40)

## 2017-12-01 NOTE — Patient Instructions (Signed)

## 2017-12-01 NOTE — Progress Notes (Signed)
Chris [name]  Date of Birth  1956/01/01   Problems: 1. Coronary artery disease- He presented in 2009 with some episodes of chest pain and was found to have an inferior wall myocardial infarction. He underwent successful PTCA and stenting of his mid  right coronary artery using a 2.75 x 28 mm Promus stent. The stent was post dilated using a 3.0 noncompliant balloon.  We then positioned a 3.0 x 15 mm Promus stent in the proximal right coronary artery.  The stent was post dilated using a 3.25 mm noncompliant balloon. 2. Hyperlipidemia 3. Sleep apnea 4. Hypertension     Chris [name] is a 62 year old gentleman. He has a history of coronary artery disease. He presented in 2009 with some episodes of chest pain and was found to have an inferior wall myocardial infarction. He underwent successful PTCA and stenting of his mid  right coronary artery using a 2.75 x 28 mm Promus stent. The stent was post dilated using a 3.0 noncompliant balloon.  We then positioned a 3.0 x 15 mm Promus stent in the proximal right coronary artery.  The stent was post dilated using a 3.25 mm noncompliant balloon.  Pt is doing well.  No angina or dyspnea.    He has not been sleeping well.  He also did not take his meds this am.  April 13, 2012: Chris [name] is doing well from a cardiac standpoint.  Exercising on the treadmill every other day. Complains of constipation.  October 17, 2012:  Thank started having some episodes of chest pain last week.  He had been doing lots of physical activity - played 36 holes of golf a day  for 2 days straight.  He also has been busy doing phyisical work.     He had a stress Myoview study. He walked for 11 minutes on a standard Bruce protocol triple test. A Myoview study revealed an old inferior wall myocardial infarction but was otherwise unremarkable.   June 14, 2013:  He is doing well .Chris [name] Has some back pain and "crick" in his neck.  No CP .  Staying active, walking in the evenings 2.5 miles    Sept. 29, 2015:  Chris [name] is doing ok.  He is not working at [employer].   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  Doing well from a cardiac standpoint.  Still walking quite a bit.    Aug. 29, 2016:  Doing well. Owns his own business.  [name]  No CP , no dyspnea.   Jan. 17, 2017: No angina  Is staying busy Working hard.   Oct. 9, 2017:   Chris [name] is seen today for follow up of his CAD Is healing up from a left ankle injury.  Walking on crutches .   Wears him out.  Has not had any chest pain  Or dyspnea  Aug. 14, 2018: Chris [name] is seen for follow up for his CAD Had emergent Appy surgery. Was found to have a ventral hernia at that time. Now needs to have hernia repair.  He had no complications with his emergent appendectomy. He's been doing well. No episodes of chest pain or short of breath.  Feb. 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   No CP or dyspnea Works out on th eElliptical  Primary MD added Chlorthaladone .   2 years ago , he had broke his left ankle and developed a DVT / pulmonary  embolus .    Sept.  11, 2019  Having some pain in right shoulder.  Slept on it wrong .    Current Outpatient Medications on File Prior to Visit  Medication Sig Dispense Refill  . aspirin 81 MG tablet Take 81 mg by mouth daily.     Chris [name] atorvastatin (LIPITOR) 40 MG tablet TAKE 1 TABLET (40 MG TOTAL) BY MOUTH DAILY. 30 tablet 11  . chlorthalidone (HYGROTON) 25 MG tablet Take 1 tablet (25 mg total) by mouth daily. 90 tablet 3  . clopidogrel (PLAVIX) 75 MG tablet Take 75 mg by mouth daily.    . fluticasone (FLONASE) 50 MCG/ACT nasal spray PLACE 1 SPRAY INTO BOTH NOSTRILS DAILY. 16 g 0  . metFORMIN (GLUCOPHAGE) 500 MG tablet TAKE 1 TABLET BY MOUTH EVERY DAY WITH BREAKFAST 90 tablet 1  . metoprolol succinate (TOPROL-XL) 25 MG 24 hr tablet TAKE 1/2 TABLET BY MOUTH TWICE A DAY 90 tablet 2  .  multivitamin-iron-minerals-folic acid (CENTRUM) chewable tablet Chew 1 tablet by mouth daily.    . Omega-3 Fatty Acids (FISH OIL) 1000 MG CAPS Take by mouth.    . pantoprazole (PROTONIX) 40 MG tablet Take 1 tablet (40 mg total) by mouth daily. Take 30 minutes before breakfast as needed for heart burn. 90 tablet 0  . tadalafil (CIALIS) 10 MG tablet Take 10 mg by mouth daily as needed for erectile dysfunction.     No current facility-administered medications on file prior to visit.     Allergies  Allergen Reactions  . Ace Inhibitors Rash  . Ropinirole Hcl Rash    Past Medical History:  Diagnosis Date  . Acute MI, inferior wall, initial episode of care [facility]) 2009  . Anemia   . Arthritis   . Coronary atherosclerosis of unspecified type of vessel, native or graft   . GERD (gastroesophageal reflux disease)   . High cholesterol   . Hypertension   . Seasonal allergies   . Sleep apnea    "I've never worn a mask" (09/17/2016)    Past Surgical History:  Procedure Laterality Date  . APPENDECTOMY    . CHOLECYSTECTOMY OPEN  ~ 2008   "it exploded"  . CORONARY ANGIOPLASTY WITH STENT PLACEMENT  2009    2.75 x 28 mm Promus stent to the RCA  . FRACTURE SURGERY    . HERNIA REPAIR    . INSERTION OF MESH N/A 11/30/2016   Procedure: INSERTION OF MESH;  Surgeon: [name], MD;  [facility];  Service: General;  Laterality: N/A;  . LAPAROSCOPIC APPENDECTOMY N/A 09/16/2016   Procedure: APPENDECTOMY LAPAROSCOPIC;  Surgeon: [name], MD;  Location: [facility] OR;  Service: General;  Laterality: N/A;  . LAPAROSCOPIC INCISIONAL / UMBILICAL / VENTRAL HERNIA REPAIR  11/30/2016   w/repair serosal tear  . ORIF ANKLE FRACTURE Left 11/27/2015   Procedure: OPEN REDUCTION INTERNAL FIXATION (ORIF) ANKLE FRACTURE;  Surgeon: [name], MD;  Location: [facility];  Service: Orthopedics;  Laterality: Left;  Chris [name] VENTRAL HERNIA REPAIR N/A 11/30/2016   Procedure: LAPAROSCOPIC VENTRAL HERNIA REPAIR AND   REPAIR OF SEROSAL TEAR;  Surgeon: [name], MD;  Location: [facility] OR;  Service: General;  Laterality: N/A;    Social History   Tobacco Use  Smoking Status Former Smoker  . Packs/day: 2.50  . Years: 36.00  . Pack years: 90.00  . Types: Cigarettes  . Last attempt to quit: 08/22/2007  . Years since quitting: 10.2  Smokeless Tobacco Never Used  Social History   Substance and Sexual Activity  Alcohol Use Yes  . Alcohol/week: 6.0 standard drinks  . Types: 6 Cans of beer per week    Family History  Problem Relation Age of Onset  . Heart disease Mother   . Hypertension Father   . Prostate cancer Brother   . CAD Brother     Reviw of Systems:  Reviewed in the HPI.  All other systems are negative.   Physical Exam: Blood pressure (!) 148/78, pulse 61, height 5\' 9"  (1.753 m), weight 224 lb (101.6 kg), SpO2 98 %.  GEN:  Well nourished, well developed in no acute distress HEENT: Normal NECK: No JVD; No carotid bruits LYMPHATICS: No lymphadenopathy CARDIAC: RRR , no murmurs, rubs, gallops RESPIRATORY:  Clear to auscultation without rales, wheezing or rhonchi  ABDOMEN: Soft, non-tender, non-distended MUSCULOSKELETAL:  No edema; No deformity  SKIN: Warm and dry NEUROLOGIC:  Alert and oriented x 3   EKG:  Assessment / Plan:   .1. Coronary artery disease-   He's not having any angina   2. Hyperlipidemia-     Check labs today   3. Sleep apnea  4. Hypertension -     Still eating hot dogs.    5.  DVT / pulmonary embolus:   Secondary to an ankle fracture.  Continue Xarelto    Chris Miss, MD  12/01/2017 8:41 AM    [facility] [address] [place],  [address] [place], [place]  [address] Pager [phone] Phone: [phone]; Fax: [phone]

## 2017-12-08 ENCOUNTER — Encounter: Payer: Self-pay | Admitting: *Deleted

## 2017-12-08 ENCOUNTER — Other Ambulatory Visit: Payer: Self-pay | Admitting: Cardiovascular Disease

## 2017-12-28 ENCOUNTER — Other Ambulatory Visit: Payer: Self-pay | Admitting: Physician Assistant

## 2017-12-28 DIAGNOSIS — J392 Other diseases of pharynx: Secondary | ICD-10-CM

## 2017-12-28 DIAGNOSIS — J309 Allergic rhinitis, unspecified: Secondary | ICD-10-CM

## 2017-12-28 NOTE — Telephone Encounter (Signed)
Pt's pharmacy is requesting a refill on Nitroglycerin. This medication was D/C, but not by a provider. Please address

## 2018-01-28 ENCOUNTER — Other Ambulatory Visit: Payer: Self-pay | Admitting: Physician Assistant

## 2018-01-28 DIAGNOSIS — R12 Heartburn: Secondary | ICD-10-CM

## 2018-02-02 ENCOUNTER — Encounter: Payer: BLUE CROSS/BLUE SHIELD | Admitting: Family Medicine

## 2018-02-03 DIAGNOSIS — M25511 Pain in right shoulder: Secondary | ICD-10-CM | POA: Diagnosis not present

## 2018-02-03 DIAGNOSIS — S46911A Strain of unspecified muscle, fascia and tendon at shoulder and upper arm level, right arm, initial encounter: Secondary | ICD-10-CM | POA: Diagnosis not present

## 2018-02-11 DIAGNOSIS — J029 Acute pharyngitis, unspecified: Secondary | ICD-10-CM | POA: Diagnosis not present

## 2018-02-11 DIAGNOSIS — J209 Acute bronchitis, unspecified: Secondary | ICD-10-CM | POA: Diagnosis not present

## 2018-02-11 DIAGNOSIS — R0982 Postnasal drip: Secondary | ICD-10-CM | POA: Diagnosis not present

## 2018-02-11 DIAGNOSIS — J00 Acute nasopharyngitis [common cold]: Secondary | ICD-10-CM | POA: Diagnosis not present

## 2018-03-18 DIAGNOSIS — M129 Arthropathy, unspecified: Secondary | ICD-10-CM | POA: Diagnosis not present

## 2018-03-18 DIAGNOSIS — M779 Enthesopathy, unspecified: Secondary | ICD-10-CM | POA: Diagnosis not present

## 2018-03-18 DIAGNOSIS — M25511 Pain in right shoulder: Secondary | ICD-10-CM | POA: Diagnosis not present

## 2018-03-18 DIAGNOSIS — Z79899 Other long term (current) drug therapy: Secondary | ICD-10-CM | POA: Diagnosis not present

## 2018-03-29 DIAGNOSIS — M109 Gout, unspecified: Secondary | ICD-10-CM | POA: Diagnosis not present

## 2018-03-29 DIAGNOSIS — M7581 Other shoulder lesions, right shoulder: Secondary | ICD-10-CM | POA: Diagnosis not present

## 2018-03-29 DIAGNOSIS — J40 Bronchitis, not specified as acute or chronic: Secondary | ICD-10-CM | POA: Diagnosis not present

## 2018-03-29 DIAGNOSIS — M25511 Pain in right shoulder: Secondary | ICD-10-CM | POA: Diagnosis not present

## 2018-04-06 DIAGNOSIS — M7541 Impingement syndrome of right shoulder: Secondary | ICD-10-CM | POA: Diagnosis not present

## 2018-04-14 DIAGNOSIS — Z808 Family history of malignant neoplasm of other organs or systems: Secondary | ICD-10-CM | POA: Diagnosis not present

## 2018-04-14 DIAGNOSIS — L578 Other skin changes due to chronic exposure to nonionizing radiation: Secondary | ICD-10-CM | POA: Diagnosis not present

## 2018-04-15 ENCOUNTER — Other Ambulatory Visit: Payer: Self-pay | Admitting: Physician Assistant

## 2018-04-15 NOTE — Telephone Encounter (Signed)
Reviewed chart I have never seen patient Last saw card in sept 2019, normal BMP  Last A1c April 2019, < 7.0 meds refilled Patient needs appt before march 2020, please call patient to schedule

## 2018-04-15 NOTE — Telephone Encounter (Signed)
LOV with Dr. Neva Seat on 02/02/2018 pt left with out being seen  LOV 08/24/17

## 2018-05-04 ENCOUNTER — Other Ambulatory Visit: Payer: Self-pay | Admitting: Family Medicine

## 2018-05-04 DIAGNOSIS — R12 Heartburn: Secondary | ICD-10-CM

## 2018-05-09 DIAGNOSIS — M25511 Pain in right shoulder: Secondary | ICD-10-CM | POA: Diagnosis not present

## 2018-05-09 DIAGNOSIS — M7551 Bursitis of right shoulder: Secondary | ICD-10-CM | POA: Diagnosis not present

## 2018-05-09 DIAGNOSIS — M7541 Impingement syndrome of right shoulder: Secondary | ICD-10-CM | POA: Diagnosis not present

## 2018-05-12 DIAGNOSIS — K2900 Acute gastritis without bleeding: Secondary | ICD-10-CM | POA: Diagnosis not present

## 2018-05-12 DIAGNOSIS — R112 Nausea with vomiting, unspecified: Secondary | ICD-10-CM | POA: Diagnosis not present

## 2018-05-16 ENCOUNTER — Encounter: Payer: Self-pay | Admitting: Cardiovascular Disease

## 2018-05-16 ENCOUNTER — Ambulatory Visit (INDEPENDENT_AMBULATORY_CARE_PROVIDER_SITE_OTHER): Payer: BLUE CROSS/BLUE SHIELD | Admitting: Cardiovascular Disease

## 2018-05-16 VITALS — BP 134/80 | HR 67 | Ht 69.0 in | Wt 208.1 lb

## 2018-05-16 DIAGNOSIS — E782 Mixed hyperlipidemia: Secondary | ICD-10-CM

## 2018-05-16 DIAGNOSIS — I2510 Atherosclerotic heart disease of native coronary artery without angina pectoris: Secondary | ICD-10-CM | POA: Diagnosis not present

## 2018-05-16 DIAGNOSIS — M25511 Pain in right shoulder: Secondary | ICD-10-CM | POA: Diagnosis not present

## 2018-05-16 NOTE — Addendum Note (Signed)
Addended by: Levi Aland on: 05/16/2018 11:15 AM   Modules accepted: Orders

## 2018-05-16 NOTE — Patient Instructions (Signed)
Medication Instructions:  Call Marcelino Duster from home with the names and doses of your medications  If you need a refill on your cardiac medications before your next appointment, please call your pharmacy.   Lab work: TODAY - cholesterol, liver panel, basic metabolic panel  If you have labs (blood work) drawn today and your tests are completely normal, you will receive your results only by: [name] MyChart Message (if you have MyChart) OR . A paper copy in the mail If you have any lab test that is abnormal or we need to change your treatment, we will call you to review the results.   Testing/Procedures: None Ordered   Follow-Up: At [facility], you and your health needs are our priority.  As part of our continuing mission to provide you with exceptional heart care, we have created designated Provider Care Teams.  These Care Teams include your primary Cardiologist (physician) and Advanced Practice Providers (APPs -  Physician Assistants and Nurse Practitioners) who all work together to provide you with the care you need, when you need it. You will need a follow up appointment in:  6 months.  Please call our office 2 months in advance to schedule this appointment.  You may see Kristeen Miss, MD or one of the following Advanced Practice Providers on your designated Care Team: Tereso Newcomer, PA-C [place], [place] . Berton Bon, NP

## 2018-05-16 NOTE — Progress Notes (Signed)
Chris [name]  Date of Birth  1956/01/01   Problems: 1. Coronary artery disease- He presented in 2009 with some episodes of chest pain and was found to have an inferior wall myocardial infarction. He underwent successful PTCA and stenting of his mid  right coronary artery using a 2.75 x 28 mm Promus stent. The stent was post dilated using a 3.0 noncompliant balloon.  We then positioned a 3.0 x 15 mm Promus stent in the proximal right coronary artery.  The stent was post dilated using a 3.25 mm noncompliant balloon. 2. Hyperlipidemia 3. Sleep apnea 4. Hypertension     Chris [name] is a 63 year old gentleman. He has a history of coronary artery disease. He presented in 2009 with some episodes of chest pain and was found to have an inferior wall myocardial infarction. He underwent successful PTCA and stenting of his mid  right coronary artery using a 2.75 x 28 mm Promus stent. The stent was post dilated using a 3.0 noncompliant balloon.  We then positioned a 3.0 x 15 mm Promus stent in the proximal right coronary artery.  The stent was post dilated using a 3.25 mm noncompliant balloon.  Pt is doing well.  No angina or dyspnea.    He has not been sleeping well.  He also did not take his meds this am.  April 13, 2012: Chris [name] is doing well from a cardiac standpoint.  Exercising on the treadmill every other day. Complains of constipation.  October 17, 2012:  Thank started having some episodes of chest pain last week.  He had been doing lots of physical activity - played 36 holes of golf a day  for 2 days straight.  He also has been busy doing phyisical work.     He had a stress Myoview study. He walked for 11 minutes on a standard Bruce protocol triple test. A Myoview study revealed an old inferior wall myocardial infarction but was otherwise unremarkable.   June 14, 2013:  He is doing well .[name] Has some back pain and "crick" in his neck.  No CP .  Staying active, walking in the evenings 2.5 miles    Sept. 29, 2015:  Chris [name] is doing ok.  He is not working at [employer].   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  Doing well from a cardiac standpoint.  Still walking quite a bit.    Aug. 29, 2016:  Doing well. Owns his own business.  [name]  No CP , no dyspnea.   Jan. 17, 2017: No angina  Is staying busy Working hard.   Oct. 9, 2017:   Chris [name] is seen today for follow up of his CAD Is healing up from a left ankle injury.  Walking on crutches .   Wears him out.  Has not had any chest pain  Or dyspnea  Aug. 14, 2018: Chris [name] is seen for follow up for his CAD Had emergent Appy surgery. Was found to have a ventral hernia at that time. Now needs to have hernia repair.  He had no complications with his emergent appendectomy. He's been doing well. No episodes of chest pain or short of breath.  Feb. 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   No CP or dyspnea Works out on th eElliptical  Primary MD added Chlorthaladone .   2 years ago , he had broke his left ankle and developed a DVT / pulmonary  embolus .    Sept.  11, 2019  Having some pain in right shoulder.  Slept on it wrong .   May 16, 2018: Seen today for follow-up visit regarding his coronary artery disease.  He also has a history of hyperlipidemia and diabetes mellitus. Last lipid levels were from December 01, 2017: Total cholesterol is 130.  LDL is 54.  His triglyceride level is 203.  Having some right shoulder issues.   Getting a MRI today  May need to have shoulder surgery  He is at low risk for CV complications during shoulder surgeyr   Current Outpatient Medications on File Prior to Visit  Medication Sig Dispense Refill  . allopurinol (ZYLOPRIM) 300 MG tablet Take 300 mg by mouth daily.    [name] aspirin 81 MG tablet Take 81 mg by mouth daily.     [name] atorvastatin (LIPITOR) 40 MG tablet TAKE 1 TABLET BY MOUTH EVERY DAY 90 tablet 1   . chlorthalidone (HYGROTON) 25 MG tablet TAKE 1 TABLET BY MOUTH EVERY DAY 90 tablet 0  . clopidogrel (PLAVIX) 75 MG tablet Take 75 mg by mouth daily.    [name] doxycycline (VIBRAMYCIN) 100 MG capsule     . fluticasone (FLONASE) 50 MCG/ACT nasal spray PLACE 1 SPRAY INTO BOTH NOSTRILS DAILY. 16 g 0  . guaiFENesin-codeine 100-10 MG/5ML syrup TAKE 10 ML BY MOUTH EVERY 4 HOURS AS NEEDED FOR COUGH    . HYDROcodone-acetaminophen (NORCO/VICODIN) 5-325 MG tablet     . metFORMIN (GLUCOPHAGE) 500 MG tablet TAKE 1 TABLET BY MOUTH EVERY DAY WITH BREAKFAST 90 tablet 0  . methocarbamol (ROBAXIN) 500 MG tablet     . metoprolol succinate (TOPROL-XL) 25 MG 24 hr tablet TAKE 1/2 TABLET BY MOUTH TWICE A DAY 90 tablet 1  . Multiple Vitamin (MULTIVITAMIN) tablet Take by mouth.    . multivitamin-iron-minerals-folic acid (CENTRUM) chewable tablet Chew 1 tablet by mouth daily.    . Omega-3 Fatty Acids (FISH OIL) 1000 MG CAPS Take by mouth.    . ondansetron (ZOFRAN-ODT) 8 MG disintegrating tablet     . pantoprazole (PROTONIX) 40 MG tablet TAKE 1 TABLET BY MOUTH DAILY. TAKE 30 MINUTES BEFORE BREAKFAST AS NEEDED FOR HEART BURN. 90 tablet 0  . predniSONE (DELTASONE) 10 MG tablet     . rivaroxaban (XARELTO) 20 MG TABS tablet Xarelto 20 mg tablet  TAKE 1 TABLET (20 MG TOTAL) BY MOUTH DAILY WITH SUPPER.    . Saline (AYR SALINE NASAL DROPS) 0.65 % (Soln) SOLN Place into the nose.    . tadalafil (CIALIS) 20 MG tablet Take by mouth.    . nitroGLYCERIN (NITROSTAT) 0.4 MG SL tablet PLACE 1 TABLET (0.4 MG TOTAL) UNDER THE TONGUE EVERY 5 (FIVE) MINUTES AS NEEDED FOR CHEST PAIN. 100 tablet 3   No current facility-administered medications on file prior to visit.     Allergies  Allergen Reactions  . Ace Inhibitors Rash  . Ropinirole Hcl Rash    Past Medical History:  Diagnosis Date  . Acute MI, inferior wall, initial episode of care [facility]) 2009  . Anemia   . Arthritis   . Coronary atherosclerosis of unspecified type of vessel,  native or graft   . GERD (gastroesophageal reflux disease)   . High cholesterol   . Hypertension   . Seasonal allergies   . Sleep apnea    "I've never worn a mask" (63/28/2018)    Past Surgical History:  Procedure Laterality Date  . APPENDECTOMY    . CHOLECYSTECTOMY  OPEN  ~ 2008   "it exploded"  . CORONARY ANGIOPLASTY WITH STENT PLACEMENT  2009    2.75 x 28 mm Promus stent to the RCA  . FRACTURE SURGERY    . HERNIA REPAIR    . INSERTION OF MESH N/A 11/30/2016   Procedure: INSERTION OF MESH;  Surgeon: [name], MD;  Location: [facility] OR;  Service: General;  Laterality: N/A;  . LAPAROSCOPIC APPENDECTOMY N/A 09/16/2016   Procedure: APPENDECTOMY LAPAROSCOPIC;  Surgeon: [name], MD;  Location: [facility] OR;  Service: General;  Laterality: N/A;  . LAPAROSCOPIC INCISIONAL / UMBILICAL / VENTRAL HERNIA REPAIR  11/30/2016   w/repair serosal tear  . ORIF ANKLE FRACTURE Left 11/27/2015   Procedure: OPEN REDUCTION INTERNAL FIXATION (ORIF) ANKLE FRACTURE;  Surgeon: [name], MD;  Location: [facility];  Service: Orthopedics;  Laterality: Left;  [name] VENTRAL HERNIA REPAIR N/A 11/30/2016   Procedure: LAPAROSCOPIC VENTRAL HERNIA REPAIR AND  REPAIR OF SEROSAL TEAR;  Surgeon: [name], MD;  Location: [facility] OR;  Service: General;  Laterality: N/A;    Social History   Tobacco Use  Smoking Status Former Smoker  . Packs/day: 2.50  . Years: 36.00  . Pack years: 90.00  . Types: Cigarettes  . Last attempt to quit: 08/22/2007  . Years since quitting: 10.7  Smokeless Tobacco Never Used    Social History   Substance and Sexual Activity  Alcohol Use Yes  . Alcohol/week: 6.0 standard drinks  . Types: 6 Cans of beer per week    Family History  Problem Relation Age of Onset  . Heart disease Mother   . Hypertension Father   . Prostate cancer Brother   . CAD Brother     Reviw of Systems:  Reviewed in the HPI.  All other systems are negative.   Physical Exam: Blood pressure 134/80,  pulse 67, height 5\' 9"  (1.753 m), weight 208 lb 1.9 oz (94.4 kg), SpO2 95 %.  GEN:   Middle age man,  NAD  HEENT: Normal NECK: No JVD; No carotid bruits LYMPHATICS: No lymphadenopathy CARDIAC: RRR   RESPIRATORY:  Clear to auscultation without rales, wheezing or rhonchi  ABDOMEN: Soft, non-tender, non-distended MUSCULOSKELETAL:  No edema; No deformity  SKIN: Warm and dry NEUROLOGIC:  Alert and oriented x 3   EKG: May 16, 2018: Normal sinus rhythm at 65.  Incomplete right bundle branch block.  Previous inferior wall myocardial infarction  Assessment / Plan:   .1. Coronary artery disease-     Doing well.  No angina . He is having some shoulder issues and may need to have shoulder surgery.  If he does need to have shoulder surgery, he will be at low risk for any upcoming surgical repair.  He is on aspirin once a day.  It will be okay for him to hold his aspirin for 5 to 7 days if needed prior to surgery.  2. Hyperlipidemia-    Cont. Atorvastatin.   Check pits today.  3. Sleep apnea  4. Hypertension -     BP is well controlled.    5.  DVT / pulmonary embolus:      Kristeen Miss, MD  05/16/2018 8:17 AM    [facility] [address] [place],  [address] [place], [place]  [address] Pager [phone] Phone: [phone]; Fax: [phone]

## 2018-05-18 LAB — HEPATIC FUNCTION PANEL
ALK PHOS: 89 IU/L (ref 39–117)
ALT: 28 IU/L (ref 0–44)
AST: 29 IU/L (ref 0–40)
Albumin: 4.2 g/dL (ref 3.8–4.8)
Bilirubin Total: 0.7 mg/dL (ref 0.0–1.2)
Bilirubin, Direct: 0.23 mg/dL (ref 0.00–0.40)
Total Protein: 6.7 g/dL (ref 6.0–8.5)

## 2018-05-18 LAB — LIPID PANEL
Chol/HDL Ratio: 3.2 ratio (ref 0.0–5.0)
Cholesterol, Total: 86 mg/dL — ABNORMAL LOW (ref 100–199)
HDL: 27 mg/dL — ABNORMAL LOW (ref >39)
LDL Calculated: 42 mg/dL (ref 0–99)
Triglycerides: 86 mg/dL (ref 0–149)
VLDL Cholesterol Cal: 17 mg/dL (ref 5–40)

## 2018-05-18 LAB — BASIC METABOLIC PANEL
BUN/Creatinine Ratio: 11 (ref 10–24)
BUN: 9 mg/dL (ref 8–27)
CO2: 25 mmol/L (ref 20–29)
Calcium: 9 mg/dL (ref 8.6–10.2)
Chloride: 93 mmol/L — ABNORMAL LOW (ref 96–106)
Creatinine, Ser: 0.81 mg/dL (ref 0.76–1.27)
GFR calc Af Amer: 110 mL/min/{1.73_m2} (ref >59)
GFR calc non Af Amer: 95 mL/min/{1.73_m2} (ref >59)
Glucose: 105 mg/dL — ABNORMAL HIGH (ref 65–99)
Potassium: 3.9 mmol/L (ref 3.5–5.2)
Sodium: 135 mmol/L (ref 134–144)

## 2018-05-30 DIAGNOSIS — M7541 Impingement syndrome of right shoulder: Secondary | ICD-10-CM | POA: Diagnosis not present

## 2018-05-30 DIAGNOSIS — M25511 Pain in right shoulder: Secondary | ICD-10-CM | POA: Diagnosis not present

## 2018-06-01 ENCOUNTER — Telehealth: Payer: Self-pay | Admitting: *Deleted

## 2018-06-01 ENCOUNTER — Other Ambulatory Visit: Payer: Self-pay | Admitting: Cardiology

## 2018-06-01 NOTE — Telephone Encounter (Signed)
   Primary Cardiologist: Kristeen Miss, MD  Chart reviewed as part of pre-operative protocol coverage. Given past medical history and time since last visit, based on ACC/AHA guidelines, Chris [name] would be at acceptable risk for the planned procedure without further cardiovascular testing.  Was seen by Dr. [name] 05/16/18 for CAD and cleared for surgery.   Pt may hold asprin and plavix for 5 days prior to surgery.   Please note pt was on xarelto until 05/2017 for DVT/PE post ankle surgery.    I will route this recommendation to the requesting party via Epic fax function and remove from pre-op pool.  Please call with questions.  Nada Boozer, NP 06/01/2018, 11:47 AM

## 2018-06-01 NOTE — Telephone Encounter (Signed)
Pharm can you address xarelto thanks.

## 2018-06-01 NOTE — Telephone Encounter (Signed)
Patient does not have Xarelto on his medication list - need to clarify if patient is still taking this. It was listed on his office visit with Dr [name] on 05/16/18, his plan does not address anticoagulation and AVS provided to patient lists Xarelto, however Marcelino Duster discontinued drug from his med list that day.

## 2018-06-01 NOTE — Telephone Encounter (Signed)
   [facility] Pre-operative Risk Assessment    Request for surgical clearance:  1. What type of surgery is being performed? RIGHT SHOULDER SCOPE W/ROTATOR CUFF REPAIR   2. When is this surgery scheduled? 06/14/18   3. What type of clearance is required (medical clearance vs. Pharmacy clearance to hold med vs. Both)? MEDICAL  4. Are there any medications that need to be held prior to surgery and how long?PLAVIX AND ASA   5. Practice name and name of physician performing surgery? [facility]; DR. SUPPLE   6. What is your office phone number [phone]    7.   What is your office fax number [phone]  8.   Anesthesia type (None, local, MAC, general) ? GENERAL   Chris [name] 06/01/2018, 9:20 AM  _________________________________________________________________   (provider comments below)

## 2018-06-06 ENCOUNTER — Ambulatory Visit: Payer: BLUE CROSS/BLUE SHIELD | Admitting: Cardiovascular Disease

## 2018-06-07 ENCOUNTER — Telehealth: Payer: Self-pay | Admitting: Cardiovascular Disease

## 2018-06-07 NOTE — Telephone Encounter (Signed)
Pt called to ask about a shoulder surgery he is supposed to have soon.

## 2018-06-08 ENCOUNTER — Other Ambulatory Visit: Payer: Self-pay | Admitting: Physician Assistant

## 2018-06-08 NOTE — Telephone Encounter (Signed)
Requested medication (s) are due for refill today: yes  Requested medication (s) are on the active medication list: yes  Last refill:  Last refilled by historical provider  Future visit scheduled: No  Notes to clinic:  LOV 02/02/18. Medication last refilled by historical provider    Requested Prescriptions  Pending Prescriptions Disp Refills   clopidogrel (PLAVIX) 75 MG tablet [Pharmacy Med Name: CLOPIDOGREL 75 MG TABLET] 90 tablet 3    Sig: TAKE 1 TABLET BY MOUTH EVERY DAY *START DAY AFTER STOPPING XARELTO*     Hematology: Antiplatelets - clopidogrel Failed - 06/08/2018  3:31 PM      Failed - Evaluate AST, ALT within 2 months of therapy initiation.      Failed - HCT in normal range and within 180 days    HCT  Date Value Ref Range Status  12/07/2016 25.0 (L) 39.0 - 52.0 % Final   HCT, POC  Date Value Ref Range Status  05/05/2017 43.2 (A) 43.5 - 53.7 % Final         Failed - HGB in normal range and within 180 days    Hemoglobin  Date Value Ref Range Status  05/05/2017 14.4 14.1 - 18.1 g/dL Final  53/29/9242 7.8 (L) 13.0 - 17.0 g/dL Final         Failed - PLT in normal range and within 180 days    Platelets  Date Value Ref Range Status  12/07/2016 389 150 - 400 K/uL Final   Platelet Count, POC  Date Value Ref Range Status  05/05/2017 375 142 - 424 K/uL Final         Failed - Valid encounter within last 6 months    Recent Outpatient Visits          4 months ago    [facility], Asencion Partridge, MD   9 months ago Throat irritation   [facility], Asencion Partridge, MD   10 months ago Well controlled diabetes mellitus [facility])   [facility], Marolyn Hammock, PA-C   11 months ago Paresthesia   [facility] at [facility], Marolyn Hammock, PA-C   11 months ago Acute URI   [facility] at [facility], Marolyn Hammock, PA-C             Passed - ALT in normal range and within 360 days    ALT  Date Value Ref Range Status  05/16/2018 28 0 - 44  IU/L Final         Passed - AST in normal range and within 360 days    AST  Date Value Ref Range Status  05/16/2018 29 0 - 40 IU/L Final

## 2018-06-13 ENCOUNTER — Other Ambulatory Visit: Payer: Self-pay

## 2018-06-13 DIAGNOSIS — R12 Heartburn: Secondary | ICD-10-CM

## 2018-06-13 MED ORDER — ATORVASTATIN CALCIUM 40 MG PO TABS: 40.0000 mg | ORAL_TABLET | Freq: Every day | ORAL | 1 refills | Status: DC

## 2018-06-13 MED ORDER — METOPROLOL SUCCINATE ER 25 MG PO TB24: 12.5000 mg | ORAL_TABLET | Freq: Two times a day (BID) | ORAL | 1 refills | Status: DC

## 2018-06-27 ENCOUNTER — Other Ambulatory Visit: Payer: Self-pay | Admitting: Physician Assistant

## 2018-06-28 ENCOUNTER — Other Ambulatory Visit: Payer: Self-pay

## 2018-06-28 ENCOUNTER — Telehealth: Payer: Self-pay | Admitting: Cardiovascular Disease

## 2018-06-28 MED ORDER — CLOPIDOGREL BISULFATE 75 MG PO TABS: 75.0000 mg | ORAL_TABLET | Freq: Every day | ORAL | 3 refills | Status: DC

## 2018-06-28 NOTE — Telephone Encounter (Signed)
New message     *STAT* If patient is at the pharmacy, call can be transferred to refill team.   1. Which medications need to be refilled? (please list name of each medication and dose if known) Clopidogrel 75 mg   2. Which pharmacy/location (including street and city if local pharmacy) is medication to be sent to? [facility] on [address]   3. Do they need a 30 day or 90 day supply? 90 day

## 2018-06-30 IMAGING — DX DG CHEST 2V
2 series · 2 of 2 positions shown · non-contrast
Comparison: Chest x-ray dated in June 22, 2016.

CLINICAL DATA: Shortness of breath and weakness.

EXAM:
CHEST  2 VIEW

[chest pa]
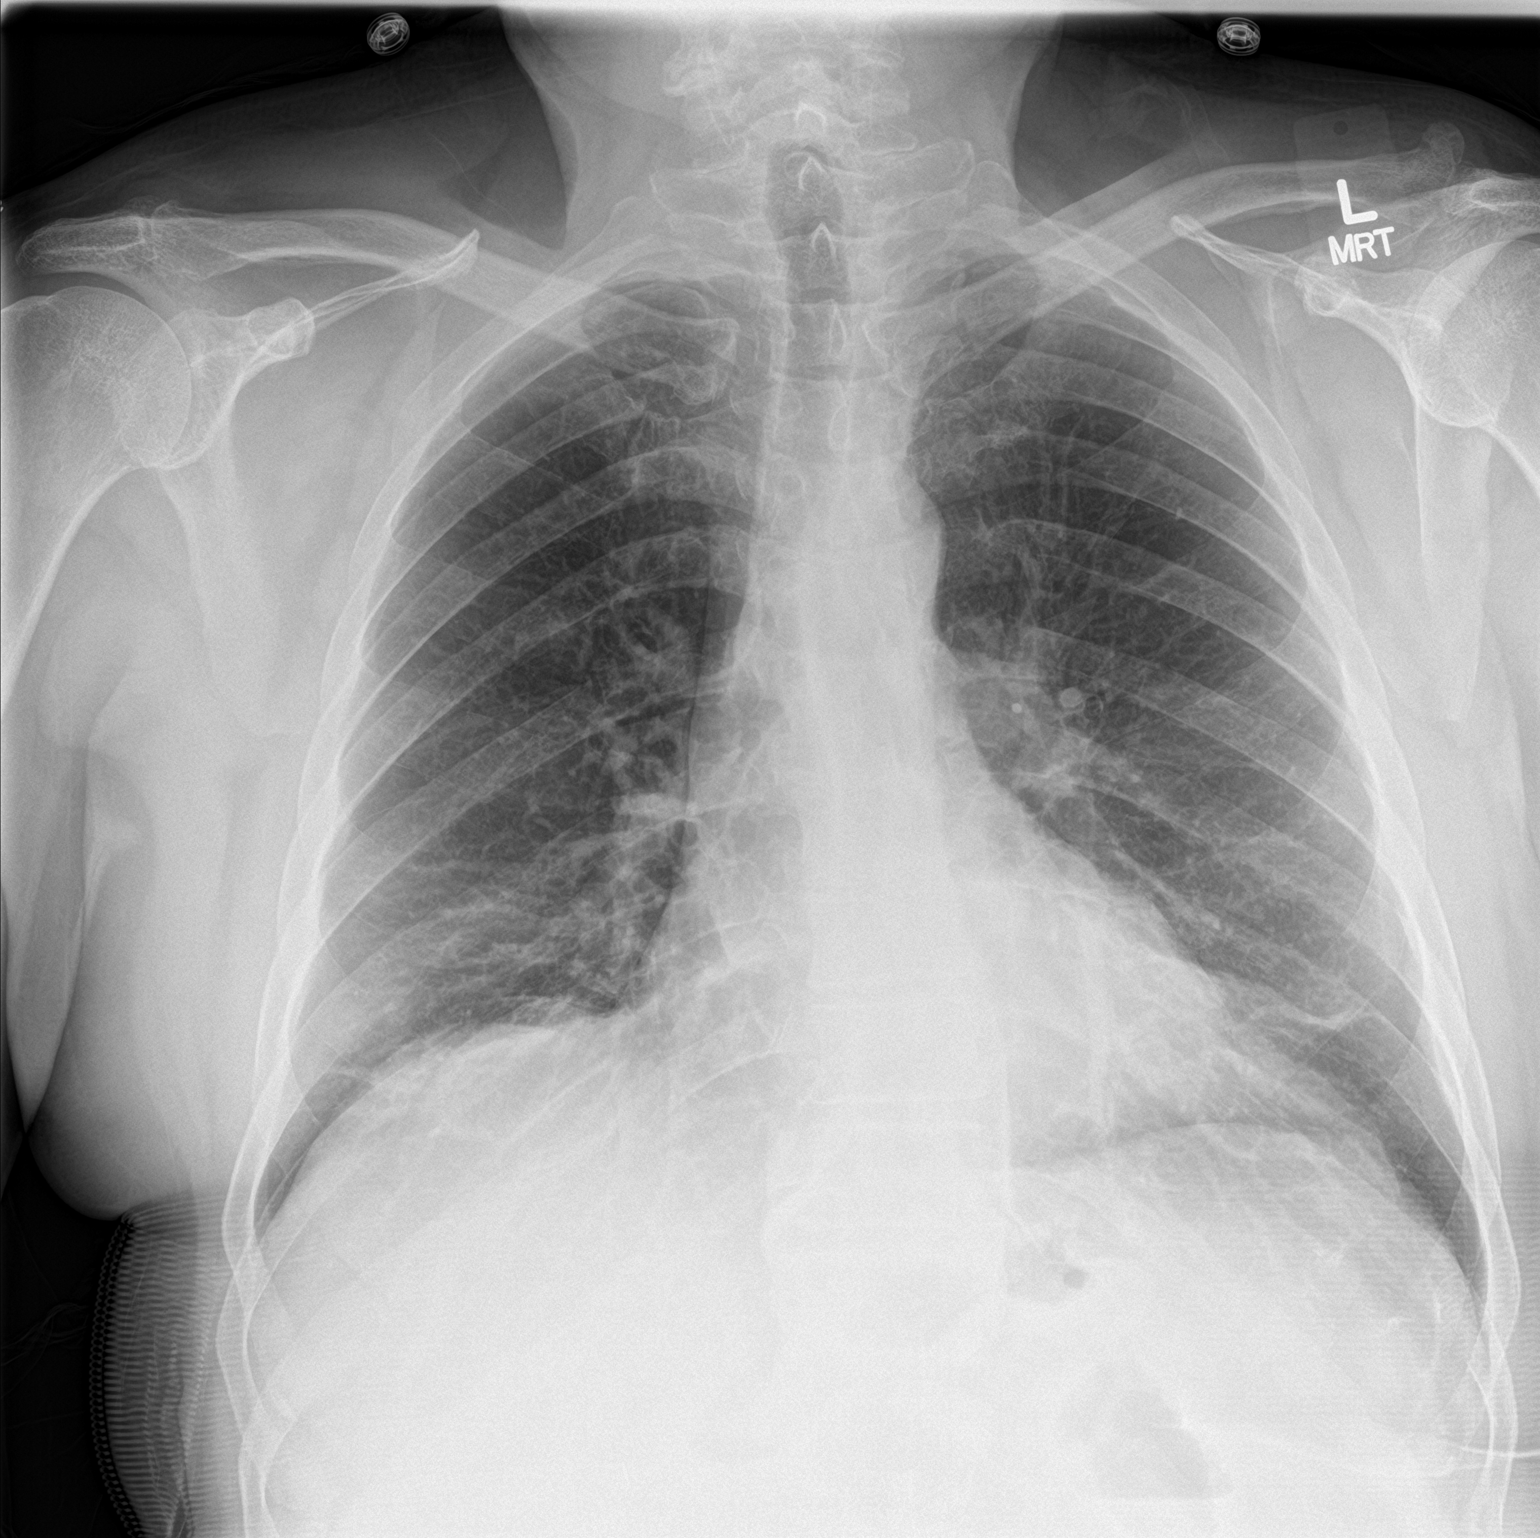

[chest lat]
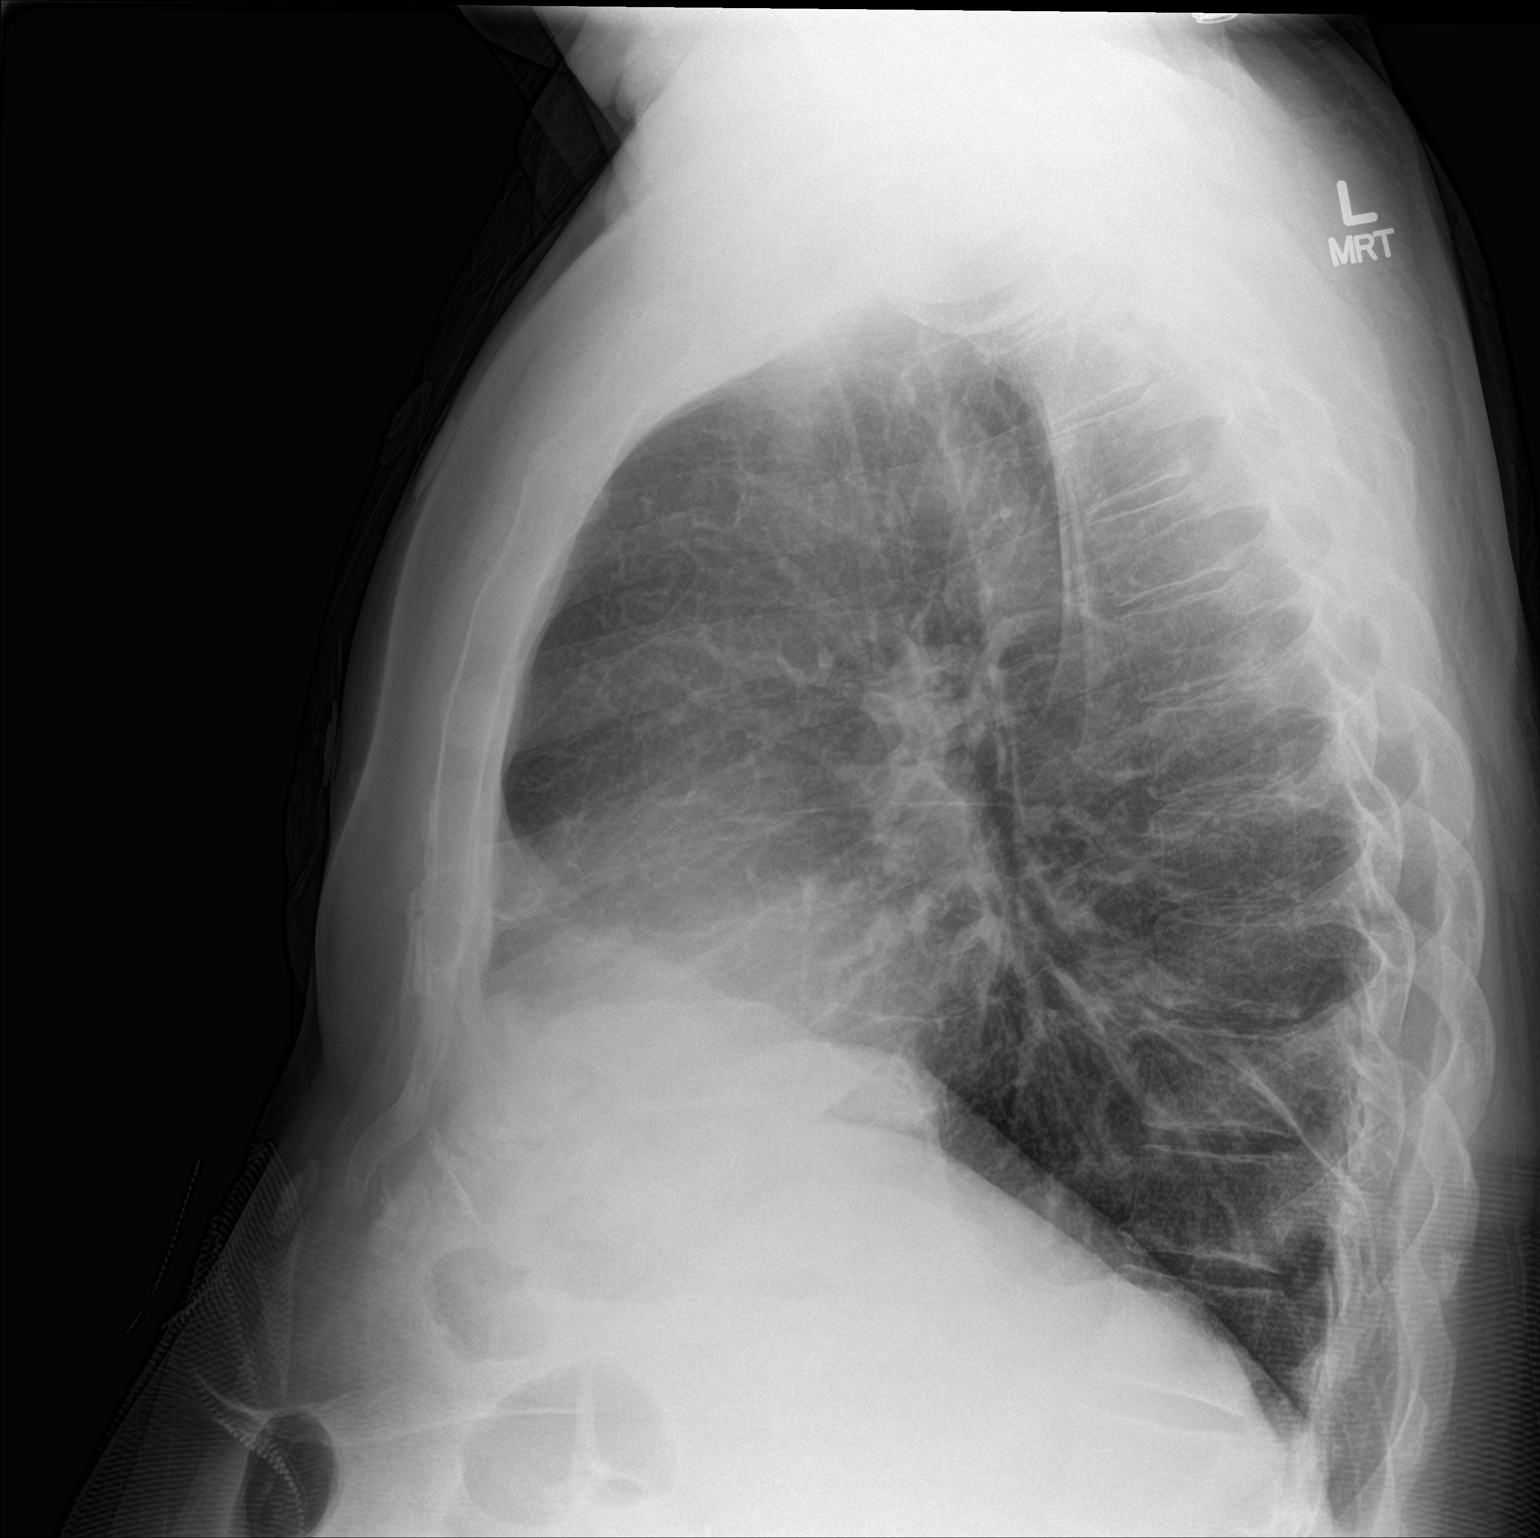

[2 of 2 positions shown; findings below may reference images not displayed]

FINDINGS: The cardiomediastinal silhouette is normal in size. Normal pulmonary
vascularity. Bibasilar atelectasis. No focal consolidation, pleural
effusion, or pneumothorax. No acute osseous abnormality.
IMPRESSION: Bibasilar atelectasis.  No active cardiopulmonary disease.

## 2018-06-30 IMAGING — CT CT ANGIO CHEST
2 of 7 series · 17 of 46 positions shown · IV contrast (isovue)
Comparison: Chest radiograph 12/05/2016

CLINICAL DATA: Shortness of breath.  Recent surgery.

EXAM:
CT ANGIOGRAPHY CHEST WITH CONTRAST
TECHNIQUE: Multidetector CT imaging of the chest was performed using the
standard protocol during bolus administration of intravenous
contrast. Multiplanar CT image reconstructions and MIPs were
obtained to evaluate the vascular anatomy.
CONTRAST:  100 mL Isovue 370

[Series 7: thins · axial · 0.72mm/px · z∈[-430,-164]mm · 14 of 428 slices shown]
[im 24/428  lung]
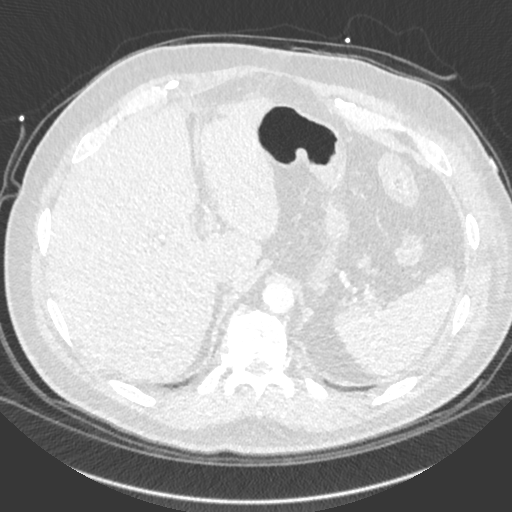
[im 48/428  soft-tissue]
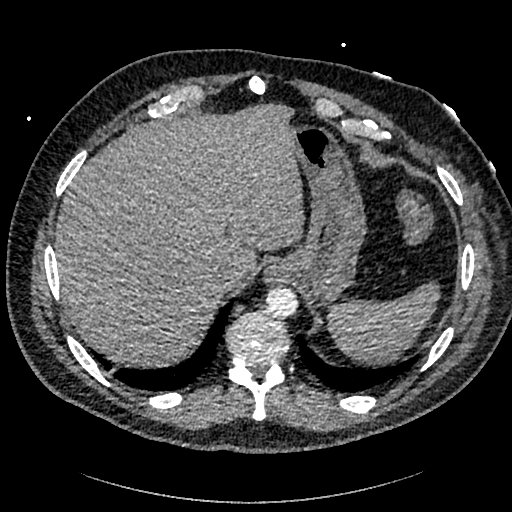
[im 95/428  lung]
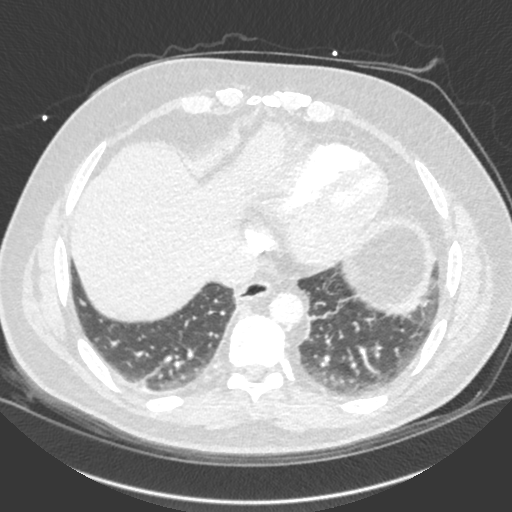
[im 119/428  soft-tissue]
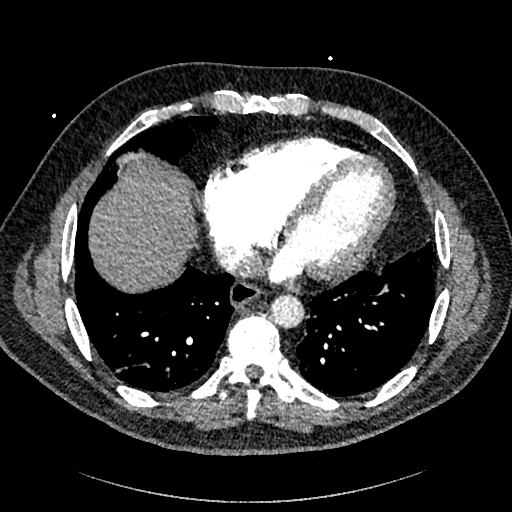
[im 143/428  lung]
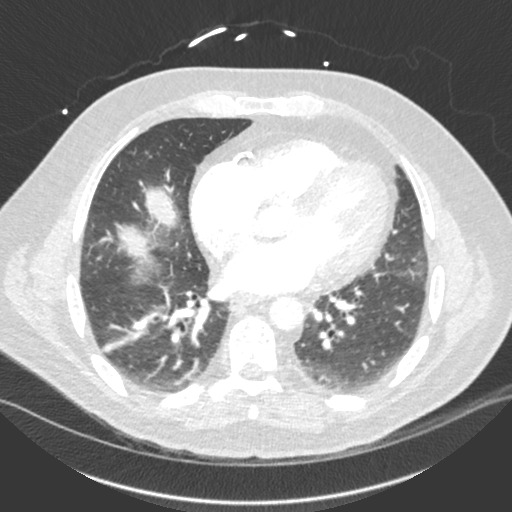
[im 167/428  soft-tissue]
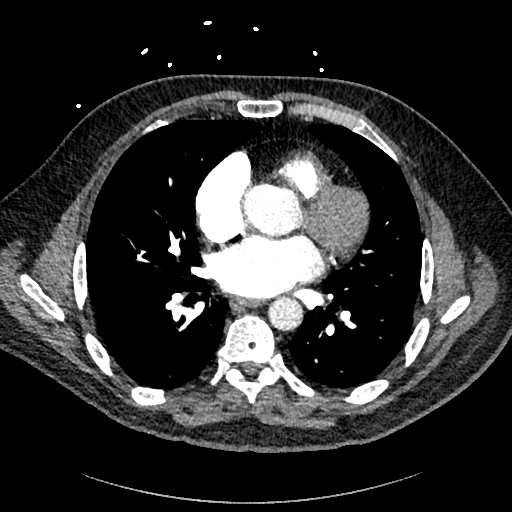
[im 190/428  lung]
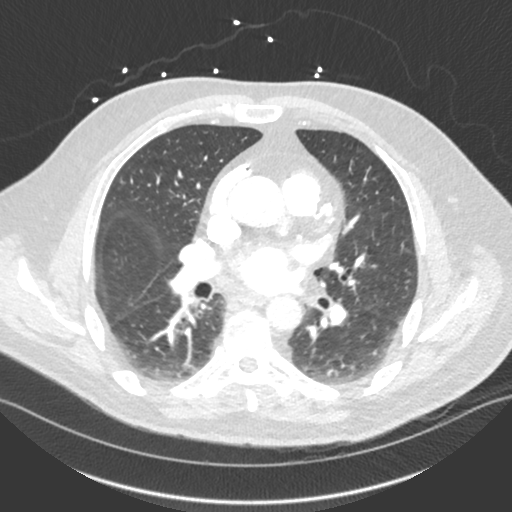
[im 238/428  soft-tissue]
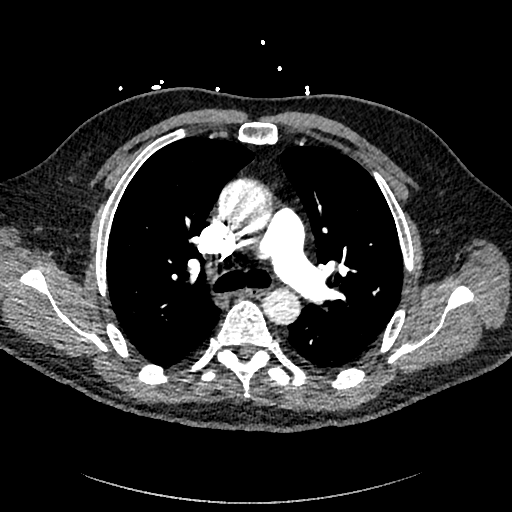
[im 261/428  lung]
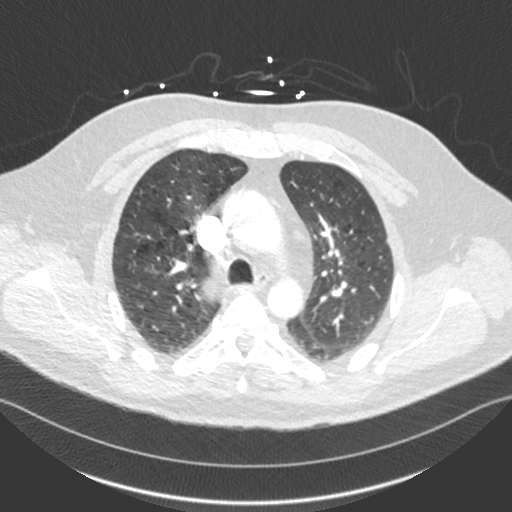
[im 285/428  soft-tissue]
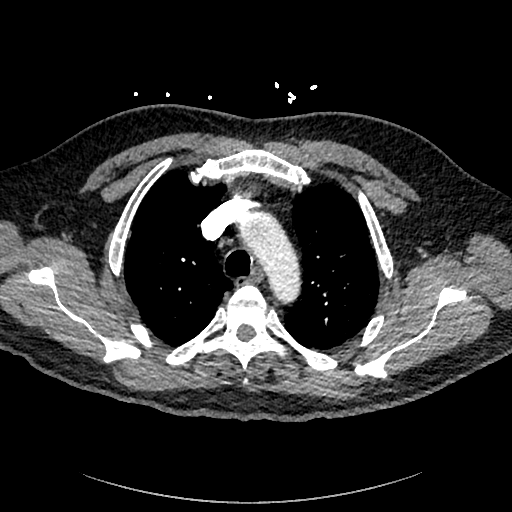
[im 309/428  lung]
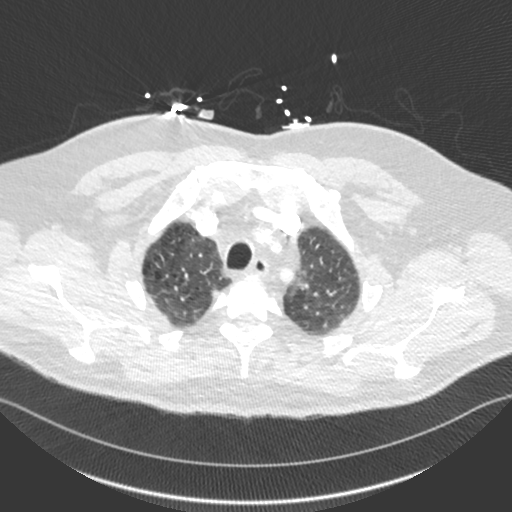
[im 333/428  soft-tissue]
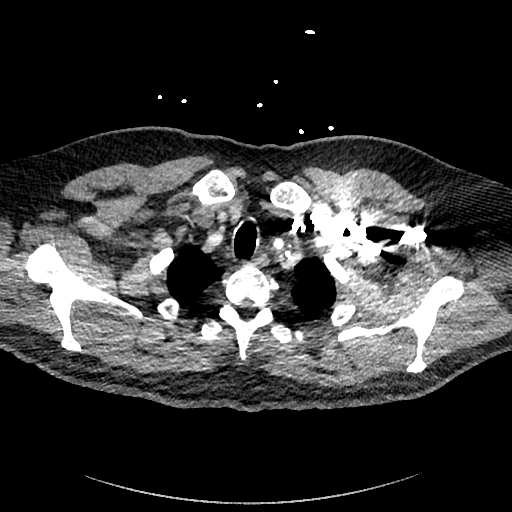
[im 380/428  lung]
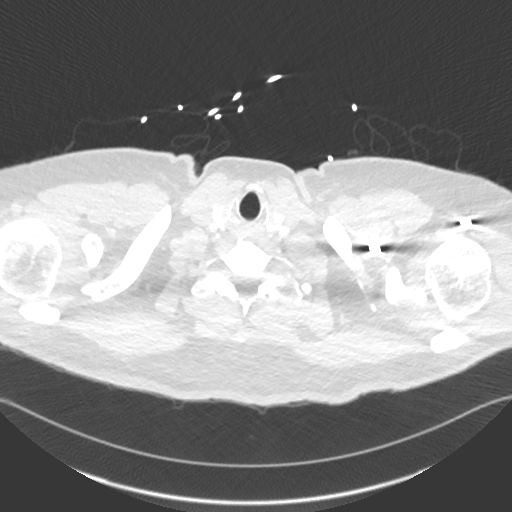
[im 404/428  soft-tissue]
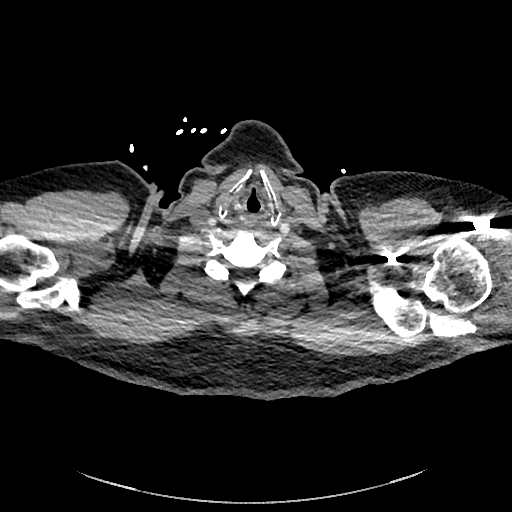

[Series 8: cor · coronal · 0.53mm/px · 3 of 167 slices shown]
[im 42/167  soft-tissue]
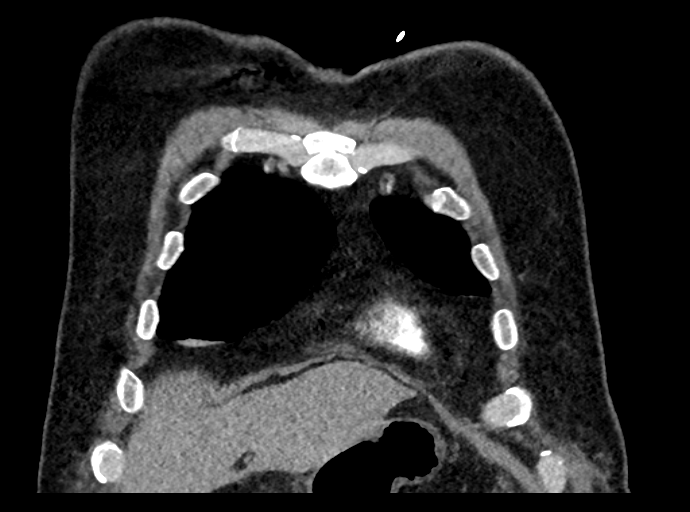
[im 84/167  soft-tissue]
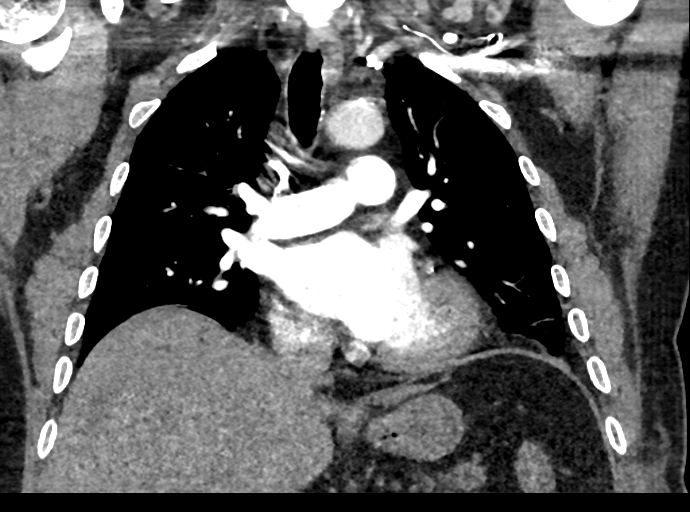
[im 125/167  soft-tissue]
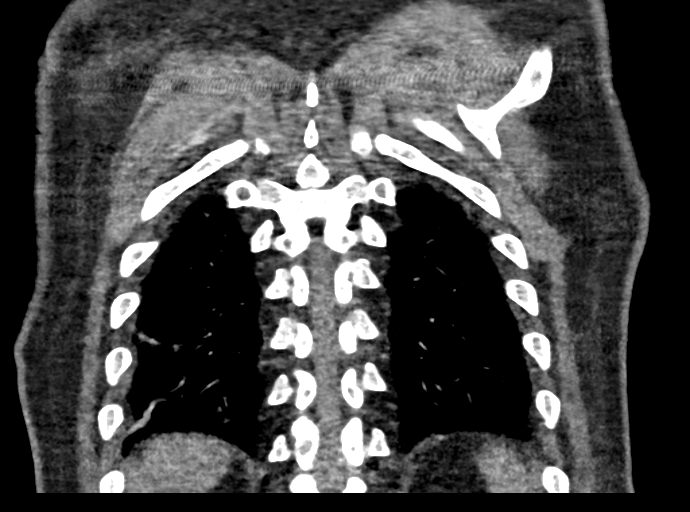

[17 of 46 positions shown; findings below may reference images not displayed]

FINDINGS: Cardiovascular: Contrast injection is sufficient to demonstrate
satisfactory opacification of the pulmonary arteries to the
segmental level. There is nonocclusive thrombus within the proximal
apical and posterior segmental arteries of the right upper lobe. No
other filling defect. The main pulmonary artery is within normal
limits for size. There is no CT evidence of acute right heart
strain. The visualized aorta is normal. There is a normal variant
aortic arch branching pattern with the brachiocephalic and left
common carotid arteries sharing a common origin.. Heart size is
normal, without pericardial effusion. Multifocal coronary artery
calcification.

Mediastinum/Nodes: No mediastinal, hilar or axillary
lymphadenopathy. The visualized thyroid and thoracic esophageal
course are unremarkable.

Lungs/Pleura: Mild biapical emphysema. No pulmonary nodules or
masses. No pleural effusion or pneumothorax. No focal airspace
consolidation. No focal pleural abnormality.

Upper Abdomen: Contrast bolus timing is not optimized for evaluation
of the abdominal organs. Within this limitation, the visualized
organs of the upper abdomen are normal.

Musculoskeletal: No chest wall abnormality. No acute or significant
osseous findings.

Review of the MIP images confirms the above findings.
IMPRESSION: 1. Small nonocclusive pulmonary embolus within the proximal apical
and posterior segmental arteries of the right upper lobe.
2. No evidence of right heart strain.
3.  Emphysema ([id number]).  Coronary artery atherosclerosis.
Critical Value/emergent results were called by telephone at the time
of interpretation on 12/05/2016 at [DATE] to Dr. CHAPARRA BATTLES , who
verbally acknowledged these results.

## 2018-07-10 ENCOUNTER — Other Ambulatory Visit: Payer: Self-pay | Admitting: Family Medicine

## 2018-07-30 ENCOUNTER — Other Ambulatory Visit: Payer: Self-pay | Admitting: Family Medicine

## 2018-07-30 DIAGNOSIS — R12 Heartburn: Secondary | ICD-10-CM

## 2018-07-30 NOTE — Telephone Encounter (Signed)
Please review

## 2018-08-03 ENCOUNTER — Other Ambulatory Visit: Payer: Self-pay | Admitting: Cardiovascular Disease

## 2018-08-03 ENCOUNTER — Telehealth: Payer: Self-pay | Admitting: Cardiovascular Disease

## 2018-08-03 DIAGNOSIS — R12 Heartburn: Secondary | ICD-10-CM

## 2018-08-03 NOTE — Telephone Encounter (Signed)
 *  STAT* If patient is at the pharmacy, call can be transferred to refill team.   1. Which medications need to be refilled? (please list name of each medication and dose if known) pantoprazole (PROTONIX) 40 MG tablet  2. Which pharmacy/location (including street and city if local pharmacy) is medication to be sent to? [facility]  3. Do they need a 30 day or 90 day supply? 30 day  Patient would like to see if Dr [name] will fill this medication from now on

## 2018-08-04 NOTE — Telephone Encounter (Signed)
Patient will have to get this medication from PCP or GI doctor due to the need for long-term use to be monitored

## 2018-08-04 NOTE — Telephone Encounter (Signed)
Called pt and left message informing pt that he would have to request a refill for pantoprazole with his PCP and if he has any other cardiac problems, questions or concerns, to call our office back.

## 2018-08-04 NOTE — Telephone Encounter (Signed)
Pt calling requesting a refill on pantoprazole, would Dr. [name] like to refill this medication? Please address

## 2018-08-09 ENCOUNTER — Telehealth: Payer: Self-pay | Admitting: Family Medicine

## 2018-08-09 NOTE — Telephone Encounter (Signed)
Made patient an appt with Dr. Leretha Pol on 08/24/2018. Pt is completely out of his protonix and would like enough to last until his appt. [facility] [address].

## 2018-08-11 ENCOUNTER — Other Ambulatory Visit: Payer: Self-pay | Admitting: *Deleted

## 2018-08-11 DIAGNOSIS — R12 Heartburn: Secondary | ICD-10-CM

## 2018-08-11 MED ORDER — PANTOPRAZOLE SODIUM 40 MG PO TBEC: DELAYED_RELEASE_TABLET | ORAL | 0 refills | Status: DC

## 2018-08-11 NOTE — Telephone Encounter (Signed)
Prescription sent

## 2018-08-24 ENCOUNTER — Telehealth: Payer: BLUE CROSS/BLUE SHIELD | Admitting: Family Medicine

## 2018-09-09 ENCOUNTER — Other Ambulatory Visit: Payer: Self-pay | Admitting: Family Medicine

## 2018-09-09 DIAGNOSIS — R12 Heartburn: Secondary | ICD-10-CM

## 2018-10-05 ENCOUNTER — Other Ambulatory Visit: Payer: Self-pay | Admitting: Family Medicine

## 2018-10-05 ENCOUNTER — Ambulatory Visit: Payer: BC Managed Care – PPO | Admitting: Family Medicine

## 2018-10-05 ENCOUNTER — Encounter: Payer: Self-pay | Admitting: Family Medicine

## 2018-10-05 ENCOUNTER — Other Ambulatory Visit: Payer: Self-pay

## 2018-10-05 VITALS — BP 138/82 | HR 63 | Temp 97.6°F | Resp 16 | Ht 69.0 in | Wt 216.0 lb

## 2018-10-05 DIAGNOSIS — M722 Plantar fascial fibromatosis: Secondary | ICD-10-CM

## 2018-10-05 DIAGNOSIS — E119 Type 2 diabetes mellitus without complications: Secondary | ICD-10-CM | POA: Diagnosis not present

## 2018-10-05 DIAGNOSIS — E1142 Type 2 diabetes mellitus with diabetic polyneuropathy: Secondary | ICD-10-CM | POA: Insufficient documentation

## 2018-10-05 DIAGNOSIS — R12 Heartburn: Secondary | ICD-10-CM | POA: Diagnosis not present

## 2018-10-05 DIAGNOSIS — K219 Gastro-esophageal reflux disease without esophagitis: Secondary | ICD-10-CM

## 2018-10-05 MED ORDER — PANTOPRAZOLE SODIUM 40 MG PO TBEC: DELAYED_RELEASE_TABLET | ORAL | 3 refills | Status: DC

## 2018-10-05 NOTE — Telephone Encounter (Signed)
Requested medication (s) are due for refill today:chlorthalidone and metformin  Requested medication (s) are on the active medication list:yes  Last refill:  07/10/2018  Future visit scheduled: yes  Notes to clinic:  No valid encounter in last 6 months    Requested Prescriptions  Pending Prescriptions Disp Refills   chlorthalidone (HYGROTON) 25 MG tablet [Pharmacy Med Name: CHLORTHALIDONE 25 MG TABLET] 90 tablet 0    Sig: TAKE 1 TABLET BY MOUTH EVERY DAY     Cardiovascular: Diuretics - Thiazide Failed - 10/05/2018  1:10 AM      Failed - Valid encounter within last 6 months    Recent Outpatient Visits          Today Gastroesophageal reflux disease without esophagitis   [facility] [place], MD   8 months ago    [facility] [place], Ranell Patrick, MD   1 year ago Throat irritation   [facility] [place], Ranell Patrick, MD   1 year ago Well controlled diabetes mellitus [facility])   [facility] [place], Audrie Lia, PA-C   1 year ago Paresthesia   [facility] [place] [facility], Audrie Lia, PA-C      Future Appointments            In 6 months Rutherford Guys, MD [facility] [place], [place] in normal range and within 360 days    Calcium  Date Value Ref Range Status  05/16/2018 9.0 8.6 - 10.2 mg/dL Final         Passed - Cr in normal range and within 360 days    Creat  Date Value Ref Range Status  11/11/2015 0.87 0.70 - 1.25 mg/dL Final    Comment:      For patients > or = 63 years of age: The upper reference limit for Creatinine is approximately 13% higher for people identified as African-American.      Creatinine, Ser  Date Value Ref Range Status  05/16/2018 0.81 0.76 - 1.27 mg/dL Final         Passed - K in normal range and within 360 days    Potassium  Date Value Ref Range Status  05/16/2018 3.9 3.5 - 5.2 mmol/L Final         Passed - Na in normal range and within 360 days    Sodium  Date Value Ref  Range Status  05/16/2018 135 134 - 144 mmol/L Final         Passed - Last BP in normal range    BP Readings from Last 1 Encounters:  10/05/18 138/82          metFORMIN (GLUCOPHAGE) 500 MG tablet [Pharmacy Med Name: METFORMIN HCL 500 MG TABLET] 90 tablet 0    Sig: TAKE 1 TABLET BY MOUTH EVERY DAY WITH BREAKFAST     Endocrinology:  Diabetes - Biguanides Failed - 10/05/2018  1:10 AM      Failed - HBA1C is between 0 and 7.9 and within 180 days    Hemoglobin A1C  Date Value Ref Range Status  07/14/2017 6.3  Final   Hgb A1c MFr Bld  Date Value Ref Range Status  11/19/2014 6.1 (H) <5.7 % Final    Comment:  According to the ADA Clinical Practice Recommendations for 2011, when HbA1c is used as a screening test:     >=6.5%   Diagnostic of Diabetes Mellitus            (if abnormal result is confirmed)   5.7-6.4%   Increased risk of developing Diabetes Mellitus   References:Diagnosis and Classification of Diabetes Mellitus,Diabetes HIQY,1992,41(XJJYP 1):S62-S69 and Standards of Medical Care in         Diabetes - 2011,Diabetes GATA,4699,78 (Suppl 1):S11-S61.            Failed - Valid encounter within last 6 months    Recent Outpatient Visits          Today Gastroesophageal reflux disease without esophagitis   [facility] [place], MD   8 months ago    [facility] [place], Ranell Patrick, MD   1 year ago Throat irritation   [facility] [place], Ranell Patrick, MD   1 year ago Well controlled diabetes mellitus [facility])   [facility] [place], Audrie Lia, PA-C   1 year ago Paresthesia   [facility] [place], PA-C      Future Appointments            In 6 months Rutherford Guys, MD [facility] at [place], [place] in normal range and within 360 days    Creat  Date Value Ref Range Status  11/11/2015 0.87 0.70 - 1.25 mg/dL Final     Comment:      For patients > or = 63 years of age: The upper reference limit for Creatinine is approximately 13% higher for people identified as African-American.      Creatinine, Ser  Date Value Ref Range Status  05/16/2018 0.81 0.76 - 1.27 mg/dL Final         Passed - eGFR in normal range and within 360 days    GFR, Est African American  Date Value Ref Range Status  03/13/2015 >89 >=60 mL/min Final   GFR calc Af Amer  Date Value Ref Range Status  05/16/2018 110 >59 mL/min/1.73 Final   GFR, Est Non African American  Date Value Ref Range Status  03/13/2015 >89 >=60 mL/min Final    Comment:      The estimated GFR is a calculation valid for adults (>=68 years old) that uses the CKD-EPI algorithm to adjust for age and sex. It is   not to be used for children, pregnant women, hospitalized patients,    patients on dialysis, or with rapidly changing kidney function. According to the NKDEP, eGFR >89 is normal, 60-89 shows mild impairment, 30-59 shows moderate impairment, 15-29 shows severe impairment and <15 is ESRD.      GFR calc non Af Amer  Date Value Ref Range Status  05/16/2018 95 >59 mL/min/1.73 Final   GFR  Date Value Ref Range Status  12/19/2013 84.41 >60.00 mL/min Final

## 2018-10-05 NOTE — Progress Notes (Signed)
Acute Office Visit  Subjective:    Patient ID: Chris [name], male    DOB: 10-07-1955, 63 y.o.   MRN: [id number]  Chief Complaint  Patient presents with  . Gastroesophageal Reflux    need refill on medication     HPI Patient is in today for GERD-needs refills-takes protonix every other day with improved symptoms. Pt has taken medication for 20 years. He can not quit taking medication.   CAD-seen by cardiology-no CP/SOB-walks 23miles/day, played golf-18 holes yesterday-no concerns  Foot pain noted with standing. No pain at rest then right heel pain with walking-wearing old shoes with no inserts  Needs shoulder surgery-cancelled due to COVID-cleared by cardiology for surgery  No recent gout flares  2/20 lipid panel-and lft-normal  Past Medical History:  Diagnosis Date  . Acute MI, inferior wall, initial episode of care [facility]) 2009  . Anemia   . Arthritis   . Coronary atherosclerosis of unspecified type of vessel, native or graft   . GERD (gastroesophageal reflux disease)   . High cholesterol   . Hypertension   . Seasonal allergies   . Sleep apnea    "I've never worn a mask" (09/17/2016)    Past Surgical History:  Procedure Laterality Date  . APPENDECTOMY    . CHOLECYSTECTOMY OPEN  ~ 2008   "it exploded"  . CORONARY ANGIOPLASTY WITH STENT PLACEMENT  2009    2.75 x 28 mm Promus stent to the RCA  . FRACTURE SURGERY    . HERNIA REPAIR    . INSERTION OF MESH N/A 11/30/2016   Procedure: INSERTION OF MESH;  Surgeon: Judeth Horn, MD;  Location: [place];  Service: General;  Laterality: N/A;  . LAPAROSCOPIC APPENDECTOMY N/A 09/16/2016   Procedure: APPENDECTOMY LAPAROSCOPIC;  Surgeon: Judeth Horn, MD;  Location: [place];  Service: General;  Laterality: N/A;  . LAPAROSCOPIC INCISIONAL / UMBILICAL / [place]  11/30/2016   w/repair serosal tear  . ORIF ANKLE FRACTURE Left 11/27/2015   Procedure: OPEN REDUCTION INTERNAL FIXATION (ORIF) ANKLE FRACTURE;  Surgeon: Frederik Pear, MD;  Location: [place];  Service: Orthopedics;  Laterality: Left;  [name] VENTRAL HERNIA REPAIR N/A 11/30/2016   Procedure: LAPAROSCOPIC VENTRAL HERNIA REPAIR AND  REPAIR OF SEROSAL TEAR;  Surgeon: Judeth Horn, MD;  Location: [place];  Service: General;  Laterality: N/A;    Family History  Problem Relation Age of Onset  . Heart disease Mother   . Hypertension Father   . Prostate cancer Brother   . CAD Brother     Social History   Socioeconomic History  . Marital status: Married    Spouse name: Not on file  . Number of children: Not on file  . Years of education: Not on file  . Highest education level: Not on file  Occupational History  . Occupation: Freight forwarder - Conservator, museum/gallery  Social Needs  . Financial resource strain: Not on file  . Food insecurity    Worry: Not on file    Inability: Not on file  . Transportation needs    Medical: Not on file    Non-medical: Not on file  Tobacco Use  . Smoking status: Former Smoker    Packs/day: 2.50    Years: 36.00    Pack years: 90.00    Types: Cigarettes    Quit date: 08/22/2007    Years since quitting: 11.1  . Smokeless tobacco: Never Used  Substance and Sexual Activity  . Alcohol use: Yes  Alcohol/week: 6.0 standard drinks    Types: 6 Cans of beer per week  . Drug use: Yes    Types: Amphetamines  . Sexual activity: Yes  Lifestyle  . Physical activity    Days per week: Not on file    Minutes per session: Not on file  . Stress: Not on file  Relationships  . Social Musicianconnections    Talks on phone: Not on file    Gets together: Not on file    Attends religious service: Not on file    Active member of club or organization: Not on file    Attends meetings of clubs or organizations: Not on file    Relationship status: Not on file  . Intimate partner violence    Fear of current or ex partner: Not on file    Emotionally abused: Not on file    Physically abused: Not on file    Forced sexual activity:  Not on file  Other Topics Concern  . Not on file  Social History Narrative   Lives with wife. Exercises occasionally.    Outpatient Medications Prior to Visit  Medication Sig Dispense Refill  . allopurinol (ZYLOPRIM) 300 MG tablet Take 300 mg by mouth daily.    [name]. aspirin 81 MG tablet Take 81 mg by mouth daily.     [name]. atorvastatin (LIPITOR) 40 MG tablet Take 1 tablet (40 mg total) by mouth daily. 90 tablet 1  . chlorthalidone (HYGROTON) 25 MG tablet TAKE 1 TABLET BY MOUTH EVERY DAY 90 tablet 0  . clopidogrel (PLAVIX) 75 MG tablet Take 1 tablet (75 mg total) by mouth daily. 90 tablet 3  . metFORMIN (GLUCOPHAGE) 500 MG tablet TAKE 1 TABLET BY MOUTH EVERY DAY WITH BREAKFAST 90 tablet 0  . metoprolol succinate (TOPROL-XL) 25 MG 24 hr tablet Take 0.5 tablets (12.5 mg total) by mouth 2 (two) times daily. 90 tablet 1  . multivitamin-iron-minerals-folic acid (CENTRUM) chewable tablet Chew 1 tablet by mouth daily.    . Omega-3 Fatty Acids (FISH OIL) 1200 MG CAPS Take 2 capsules by mouth 2 (two) times daily.    . pantoprazole (PROTONIX) 40 MG tablet TAKE 1 TABLET BY MOUTH DAILY. TAKE 30 MINUTES BEFORE BREAKFAST AS NEEDED FOR HEART BURN. 30 tablet 0  . tadalafil (CIALIS) 20 MG tablet Take by mouth.    . nitroGLYCERIN (NITROSTAT) 0.4 MG SL tablet PLACE 1 TABLET (0.4 MG TOTAL) UNDER THE TONGUE EVERY 5 (FIVE) MINUTES AS NEEDED FOR CHEST PAIN. 100 tablet 3   No facility-administered medications prior to visit.     Allergies  Allergen Reactions  . Ace Inhibitors Rash  . Ropinirole Hcl Rash    Review of Systems  Constitutional: Negative for fever and weight loss.  Respiratory: Negative for cough and shortness of breath.   Cardiovascular: Negative for chest pain, palpitations and leg swelling.  Gastrointestinal: Positive for heartburn. Negative for nausea and vomiting.  Musculoskeletal: Positive for joint pain.  Neurological: Negative for dizziness and headaches.  plantar fascitis-right heel       Objective:    Physical Exam  Constitutional: He is oriented to person, place, and time. He appears well-developed and well-nourished. No distress.  Eyes: Conjunctivae are normal.  Neck: Normal range of motion.  Cardiovascular: Normal rate, regular rhythm, normal heart sounds and intact distal pulses.  Pulmonary/Chest: Effort normal and breath sounds normal.  Abdominal: Soft. Bowel sounds are normal.  Musculoskeletal: Normal range of motion.  Lymphadenopathy:    He has no  cervical adenopathy.  Neurological: He is alert and oriented to person, place, and time.  monofilament testing bilat, calluses noted bilat primary toe  BP 138/82   Pulse 63   Temp 97.6 F (36.4 C) (Oral)   Resp 16   Ht 5\' 9"  (1.753 m)   Wt 216 lb (98 kg)   SpO2 98%   BMI 31.90 kg/m  Wt Readings from Last 3 Encounters:  10/05/18 216 lb (98 kg)  05/16/18 208 lb 1.9 oz (94.4 kg)  12/01/17 224 lb (101.6 kg)    Health Maintenance Due  Topic Date Due  . PNEUMOCOCCAL POLYSACCHARIDE VACCINE AGE 20-64 HIGH RISK  09/07/1957  . FOOT EXAM  09/07/1965  . OPHTHALMOLOGY EXAM  09/07/1965  . URINE MICROALBUMIN  09/07/1965  . HEMOGLOBIN A1C  01/13/2018     Lab Results  Component Value Date   TSH 1.796 03/13/2015   Lab Results  Component Value Date   WBC 6.7 05/05/2017   HGB 14.4 05/05/2017   HCT 43.2 (A) 05/05/2017   MCV 85.0 05/05/2017   PLT 389 12/07/2016   Lab Results  Component Value Date   NA 135 05/16/2018   K 3.9 05/16/2018   CO2 25 05/16/2018   GLUCOSE 105 (H) 05/16/2018   BUN 9 05/16/2018   CREATININE 0.81 05/16/2018   BILITOT 0.7 05/16/2018   ALKPHOS 89 05/16/2018   AST 29 05/16/2018   ALT 28 05/16/2018   PROT 6.7 05/16/2018   ALBUMIN 4.2 05/16/2018   CALCIUM 9.0 05/16/2018   ANIONGAP 7 12/07/2016   GFR 84.41 12/19/2013   Lab Results  Component Value Date   CHOL 86 (L) 05/16/2018   Lab Results  Component Value Date   HDL 27 (L) 05/16/2018   Lab Results  Component Value Date    LDLCALC 42 05/16/2018   Lab Results  Component Value Date   TRIG 86 05/16/2018   Lab Results  Component Value Date   CHOLHDL 3.2 05/16/2018   Lab Results  Component Value Date   HGBA1C 6.3 07/14/2017       Assessment & Plan:   1. Heartburn - pantoprazole (PROTONIX) 40 MG tablet; Take one po q day  Dispense: 90 tablet; Refill: 3  2. Gastroesophageal reflux disease without esophagitis protonix-rx 3. Type 2 diabetes mellitus without complication, without long-term current use of insulin (HCC) glucophage-A1c, urine for microalb, foot care discussed  4. Plantar fasciitis Inserts-pt to get new pair of shoes , foot care discussed-walks 4miles /day-old shoes with no inserts noted LISA Mat CarneLEIGH CORUM, MD

## 2018-10-05 NOTE — Patient Instructions (Addendum)
     If you have lab work done today you will be contacted with your lab results within the next 2 weeks.  If you have not heard from [place] then please contact us. The fastest way to get your results is to register for My Chart. We will call on your A!c and urine results Pick up new shoes and inserts to protect your feet Your prescription was sent to the pharmacy  IF you received an x-ray today, you will receive an invoice from [facility]. Please contact [facility] at [phone] with questions or concerns regarding your invoice.   IF you received labwork today, you will receive an invoice from [facility]. Please contact [facility] at [phone] with questions or concerns regarding your invoice.   Our billing staff will not be able to assist you with questions regarding bills from these companies.  You will be contacted with the lab results as soon as they are available. The fastest way to get your results is to activate your My Chart account. Instructions are located on the last page of this paperwork. If you have not heard from [place] regarding the results in 2 weeks, please contact this office.

## 2018-10-06 LAB — MICROALBUMIN, URINE: Microalbumin, Urine: 7.1 ug/mL

## 2018-10-06 LAB — HEMOGLOBIN A1C
Est. average glucose Bld gHb Est-mCnc: 131 mg/dL
Hgb A1c MFr Bld: 6.2 % — ABNORMAL HIGH (ref 4.8–5.6)

## 2018-10-13 ENCOUNTER — Other Ambulatory Visit: Payer: Self-pay

## 2018-10-13 ENCOUNTER — Encounter (HOSPITAL_COMMUNITY): Payer: Self-pay | Admitting: Emergency Medicine

## 2018-10-13 ENCOUNTER — Ambulatory Visit (HOSPITAL_COMMUNITY)
Admission: EM | Admit: 2018-10-13 | Discharge: 2018-10-13 | Disposition: A | Discharge status: Home / Self Care | Payer: BC Managed Care – PPO | Attending: Urgent Care | Admitting: Urgent Care

## 2018-10-13 DIAGNOSIS — I2510 Atherosclerotic heart disease of native coronary artery without angina pectoris: Secondary | ICD-10-CM

## 2018-10-13 DIAGNOSIS — R1030 Lower abdominal pain, unspecified: Secondary | ICD-10-CM

## 2018-10-13 DIAGNOSIS — I10 Essential (primary) hypertension: Secondary | ICD-10-CM

## 2018-10-13 DIAGNOSIS — K5900 Constipation, unspecified: Secondary | ICD-10-CM | POA: Diagnosis not present

## 2018-10-13 DIAGNOSIS — R030 Elevated blood-pressure reading, without diagnosis of hypertension: Secondary | ICD-10-CM

## 2018-10-13 MED ORDER — DOCUSATE SODIUM 50 MG PO CAPS: 50.0000 mg | ORAL_CAPSULE | Freq: Two times a day (BID) | ORAL | 5 refills | Status: DC

## 2018-10-13 MED ORDER — FLEET ENEMA 7-19 GM/118ML RE ENEM: 1.0000 | ENEMA | Freq: Every day | RECTAL | 5 refills | Status: DC | PRN

## 2018-10-13 NOTE — ED Triage Notes (Signed)
PT walked 4 miles this morning and then was not able to have a BM. PT put on a glove and reports there was a bit of blood there.   Last BM was yesterday.

## 2018-10-13 NOTE — Discharge Instructions (Addendum)
Please use a fleet enema for moderate to severe constipation such as not having a bowel movement in 3-4 days. Take the enema once a day for the next 2 days. Please also start Colace (docusate) stool softener at 50mg , twice a day for at least 1 week. You may take this stool softener long term but if your stools become loose, cut down to once a day for 1 week. If stools remain loose still, cut back to 1 pill every other day for the following week. You can stop docusate thereafter and resume as needed for constipation.  To help reduce constipation and promote bowel health: 1. Drink at least 64 ounces of water each day 2. Eat plenty of fiber (fruits, vegetables, whole grains, legumes) 3. Be physically active or exercise including walking, jogging, swimming, yoga, etc.  Salads - kale, spinach, cabbage, spring mix; use seeds like pumpkin seeds or sunflower seeds, almonds; you can also use 1-2 hard boiled eggs in your salads Fruits - avocadoes, berries (blueberries, raspberries, blackberries), apples, oranges Vegetables - aspargus, cauliflower, broccoli, green beans, brussel spouts, bell peppers; stay away from starchy vegetables like potatoes, carrots, peas  Regarding meat it is better to eat lean meats and limit your red meat consumption including pork.  Wild caught fish, chicken breast are good options.  Do not eat any foods on this list that you are allergic to.

## 2018-10-13 NOTE — ED Provider Notes (Addendum)
MRN: [id number] DOB: 24-Sep-1955  Subjective:   Chris [name] is a 63 y.o. male presenting for 1 day history of constipation, dull aching and intermittent lower abdominal pain.  Generally has hard stools, has to strain to defecate. Last BM was yesterday morning. Has hx of longstanding constipation. Has a hx of cholecystectomy.  Denies fever, nausea, vomiting, frank blood in his stools, inability to pass gas.  Patient states that he tried to manually disimpact himself but was unsuccessful.  He does not use stool softener, laxative and has never tried an enema.  Patient walks 4 miles daily to help with his bowel movements.  Regarding his blood pressure, patient reports that he has a history of CAD, hypertension and is compliant with his medications.  He states that normally his blood pressure is really good but right now he is pretty uncomfortable.  He would like to have it rechecked again before he leaves.   No current facility-administered medications for this encounter.   Current Outpatient Medications:  .  allopurinol (ZYLOPRIM) 300 MG tablet, Take 300 mg by mouth daily., Disp: , Rfl:  .  aspirin 81 MG tablet, Take 81 mg by mouth daily. , Disp: , Rfl:  .  atorvastatin (LIPITOR) 40 MG tablet, Take 1 tablet (40 mg total) by mouth daily., Disp: 90 tablet, Rfl: 1 .  chlorthalidone (HYGROTON) 25 MG tablet, TAKE 1 TABLET BY MOUTH EVERY DAY, Disp: 90 tablet, Rfl: 0 .  clopidogrel (PLAVIX) 75 MG tablet, Take 1 tablet (75 mg total) by mouth daily., Disp: 90 tablet, Rfl: 3 .  metFORMIN (GLUCOPHAGE) 500 MG tablet, TAKE 1 TABLET BY MOUTH EVERY DAY WITH BREAKFAST, Disp: 90 tablet, Rfl: 0 .  metoprolol succinate (TOPROL-XL) 25 MG 24 hr tablet, Take 0.5 tablets (12.5 mg total) by mouth 2 (two) times daily., Disp: 90 tablet, Rfl: 1 .  multivitamin-iron-minerals-folic acid (CENTRUM) chewable tablet, Chew 1 tablet by mouth daily., Disp: , Rfl:  .  nitroGLYCERIN (NITROSTAT) 0.4 MG SL tablet, PLACE 1 TABLET (0.4 MG  TOTAL) UNDER THE TONGUE EVERY 5 (FIVE) MINUTES AS NEEDED FOR CHEST PAIN., Disp: 100 tablet, Rfl: 3 .  Omega-3 Fatty Acids (FISH OIL) 1200 MG CAPS, Take 2 capsules by mouth 2 (two) times daily., Disp: , Rfl:  .  pantoprazole (PROTONIX) 40 MG tablet, Take one po q day, Disp: 90 tablet, Rfl: 3 .  tadalafil (CIALIS) 20 MG tablet, Take by mouth., Disp: , Rfl:     Allergies  Allergen Reactions  . Ace Inhibitors Rash  . Ropinirole Hcl Rash    Past Medical History:  Diagnosis Date  . Acute MI, inferior wall, initial episode of care [facility](HCC) 2009  . Anemia   . Arthritis   . Coronary atherosclerosis of unspecified type of vessel, native or graft   . GERD (gastroesophageal reflux disease)   . High cholesterol   . Hypertension   . Seasonal allergies   . Sleep apnea    "I've never worn a mask" (09/17/2016)     Past Surgical History:  Procedure Laterality Date  . APPENDECTOMY    . CHOLECYSTECTOMY OPEN  ~ 2008   "it exploded"  . CORONARY ANGIOPLASTY WITH STENT PLACEMENT  2009    2.75 x 28 mm Promus stent to the RCA  . FRACTURE SURGERY    . HERNIA REPAIR    . INSERTION OF MESH N/A 11/30/2016   Procedure: INSERTION OF MESH;  Surgeon: Jimmye NormanWyatt, James, MD;  [facility];  Service: General;  Laterality:  N/A;  . LAPAROSCOPIC APPENDECTOMY N/A 09/16/2016   Procedure: APPENDECTOMY LAPAROSCOPIC;  Surgeon: Judeth Horn, MD;  Location: [place];  Service: General;  Laterality: N/A;  . LAPAROSCOPIC INCISIONAL / UMBILICAL / [place]  11/30/2016   w/repair serosal tear  . ORIF ANKLE FRACTURE Left 11/27/2015   Procedure: OPEN REDUCTION INTERNAL FIXATION (ORIF) ANKLE FRACTURE;  Surgeon: Frederik Pear, MD;  Location: [place];  Service: Orthopedics;  Laterality: Left;  [name] VENTRAL HERNIA REPAIR N/A 11/30/2016   Procedure: LAPAROSCOPIC VENTRAL HERNIA REPAIR AND  REPAIR OF SEROSAL TEAR;  Surgeon: Judeth Horn, MD;  Location: [place];  Service: General;  Laterality: N/A;    ROS   Objective:    Vitals: BP (!) 162/107 (BP Location: Left Arm)   Pulse 68   Temp 97.9 F (36.6 C) (Temporal)   Resp 16   SpO2 100%   BP was 145/95 on recheck following full bowel movement while in clinic.  Physical Exam Constitutional:      General: He is not in acute distress.    Appearance: Normal appearance. He is well-developed. He is not ill-appearing, toxic-appearing or diaphoretic.  HENT:     Head: Normocephalic and atraumatic.     Right Ear: External ear normal.     Left Ear: External ear normal.     Nose: Nose normal.     Mouth/Throat:     Mouth: Mucous membranes are moist.     Pharynx: Oropharynx is clear.  Eyes:     General: No scleral icterus.    Extraocular Movements: Extraocular movements intact.     Pupils: Pupils are equal, round, and reactive to light.  Cardiovascular:     Rate and Rhythm: Normal rate and regular rhythm.     Heart sounds: Normal heart sounds. No murmur. No friction rub. No gallop.   Pulmonary:     Effort: Pulmonary effort is normal. No respiratory distress.     Breath sounds: Normal breath sounds. No stridor. No wheezing, rhonchi or rales.  Abdominal:     General: Bowel sounds are normal. There is distension.     Palpations: Abdomen is soft. There is no mass.     Tenderness: There is no abdominal tenderness. There is no guarding or rebound.  Neurological:     Mental Status: He is alert and oriented to person, place, and time.  Psychiatric:        Mood and Affect: Mood normal.        Behavior: Behavior normal.        Thought Content: Thought content normal.     Assessment and Plan :   1. Constipation, unspecified constipation type   2. Lower abdominal pain   3. Essential hypertension   4. Elevated blood pressure reading   5. Coronary artery disease without angina pectoris, unspecified vessel or lesion type, unspecified whether native or transplanted heart     Counseled patient on long-term management of chronic constipation.  Recommended he  use a combination of docusate daily and enema intermittently.  Dietary modifications reviewed.  Blood pressure improved by the end of his visit.  Maintain follow-up with PCP and cardiology. Counseled patient on potential for adverse effects with medications prescribed/recommended today, ER and return-to-clinic precautions discussed, patient verbalized understanding.      Jaynee Eagles, PA-C 10/13/18 1150

## 2018-12-05 ENCOUNTER — Ambulatory Visit: Payer: BLUE CROSS/BLUE SHIELD | Admitting: Cardiovascular Disease

## 2018-12-05 NOTE — Progress Notes (Signed)
Chris [name]  Date of Birth  02-13-1956   Problems: 1. Coronary artery disease- He presented in 2009 with some episodes of chest pain and was found to have an inferior wall myocardial infarction. He underwent successful PTCA and stenting of his mid  right coronary artery using a 2.75 x 28 mm Promus stent. The stent was post dilated using a 3.0 noncompliant balloon.  We then positioned a 3.0 x 15 mm Promus stent in the proximal right coronary artery.  The stent was post dilated using a 3.25 mm noncompliant balloon. 2. Hyperlipidemia 3. Sleep apnea 4. Hypertension     Chris [name] is a 63 year old gentleman. He has a history of coronary artery disease. He presented in 2009 with some episodes of chest pain and was found to have an inferior wall myocardial infarction. He underwent successful PTCA and stenting of his mid  right coronary artery using a 2.75 x 28 mm Promus stent. The stent was post dilated using a 3.0 noncompliant balloon.  We then positioned a 3.0 x 15 mm Promus stent in the proximal right coronary artery.  The stent was post dilated using a 3.25 mm noncompliant balloon.  Pt is doing well.  No angina or dyspnea.    He has not been sleeping well.  He also did not take his meds this am.  April 13, 2012: Chris [name] is doing well from a cardiac standpoint.  Exercising on the treadmill every other day. Complains of constipation.  October 17, 2012:  Thank started having some episodes of chest pain last week.  He had been doing lots of physical activity - played 36 holes of golf a day  for 2 days straight.  He also has been busy doing phyisical work.     He had a stress Myoview study. He walked for 11 minutes on a standard Bruce protocol triple test. A Myoview study revealed an old inferior wall myocardial infarction but was otherwise unremarkable.   June 14, 2013:  He is doing well .[name] Has some back pain and "crick" in his neck.  No CP .  Staying active, walking in the evenings 2.5 miles    Sept. 29, 2015:  Chris [name] is doing ok.  He is not working at [employer].   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  Doing well from a cardiac standpoint.  Still walking quite a bit.    Aug. 29, 2016:  Doing well. Owns his own business.  [name]  No CP , no dyspnea.   Jan. 17, 2017: No angina  Is staying busy Working hard.   Oct. 9, 2017:   Chris [name] is seen today for follow up of his CAD Is healing up from a left ankle injury.  Walking on crutches .   Wears him out.  Has not had any chest pain  Or dyspnea  Aug. 14, 2018: Chris [name] is seen for follow up for his CAD Had emergent Appy surgery. Was found to have a ventral hernia at that time. Now needs to have hernia repair.  He had no complications with his emergent appendectomy. He's been doing well. No episodes of chest pain or short of breath.  Feb. 11, 2019:  Doing well Doing handiman services  , quit his job at [employer] .   No CP or dyspnea Works out on th eElliptical  Primary MD added Chlorthaladone .   2 years ago , he had broke his left ankle and developed a DVT / pulmonary  embolus .    Sept.  11, 2019  Having some pain in right shoulder.  Slept on it wrong .   May 16, 2018: Seen today for follow-up visit regarding his coronary artery disease.  He also has a history of hyperlipidemia and diabetes mellitus. Last lipid levels were from December 01, 2017: Total cholesterol is 130.  LDL is 54.  His triglyceride level is 203.  Having some right shoulder issues.   Getting a MRI today  May need to have shoulder surgery  He is at low risk for CV complications during shoulder surgery.  Sept. 15, 2020  Chris [name] is seen today  Has a friend who is selling a [id number] tractor   Stays busy. No CP .  Walks 4 miles a day .     Current Outpatient Medications on File Prior to Visit  Medication Sig Dispense Refill  . allopurinol (ZYLOPRIM) 300 MG tablet Take 300 mg by mouth daily.    [name].  aspirin 81 MG tablet Take 81 mg by mouth daily.     [name]. atorvastatin (LIPITOR) 40 MG tablet Take 1 tablet (40 mg total) by mouth daily. 90 tablet 1  . chlorthalidone (HYGROTON) 25 MG tablet TAKE 1 TABLET BY MOUTH EVERY DAY 90 tablet 0  . clopidogrel (PLAVIX) 75 MG tablet Take 1 tablet (75 mg total) by mouth daily. 90 tablet 3  . docusate sodium (COLACE) 50 MG capsule Take 1 capsule (50 mg total) by mouth 2 (two) times daily. 60 capsule 5  . metFORMIN (GLUCOPHAGE) 500 MG tablet TAKE 1 TABLET BY MOUTH EVERY DAY WITH BREAKFAST 90 tablet 0  . metoprolol succinate (TOPROL-XL) 25 MG 24 hr tablet Take 0.5 tablets (12.5 mg total) by mouth 2 (two) times daily. 90 tablet 1  . multivitamin-iron-minerals-folic acid (CENTRUM) chewable tablet Chew 1 tablet by mouth daily.    . nitroGLYCERIN (NITROSTAT) 0.4 MG SL tablet PLACE 1 TABLET (0.4 MG TOTAL) UNDER THE TONGUE EVERY 5 (FIVE) MINUTES AS NEEDED FOR CHEST PAIN. 100 tablet 3  . Omega-3 Fatty Acids (FISH OIL) 1200 MG CAPS Take 2 capsules by mouth 2 (two) times daily.    . pantoprazole (PROTONIX) 40 MG tablet Take one po q day 90 tablet 3  . sodium phosphate (FLEET) 7-19 GM/118ML ENEM Place 133 mLs (1 enema total) rectally daily as needed for severe constipation. 5 enema 5  . tadalafil (CIALIS) 20 MG tablet Take by mouth.     No current facility-administered medications on file prior to visit.     Allergies  Allergen Reactions  . Ace Inhibitors Rash  . Ropinirole Hcl Rash    Past Medical History:  Diagnosis Date  . Acute MI, inferior wall, initial episode of care [facility](HCC) 2009  . Anemia   . Arthritis   . Coronary atherosclerosis of unspecified type of vessel, native or graft   . GERD (gastroesophageal reflux disease)   . High cholesterol   . Hypertension   . Seasonal allergies   . Sleep apnea    "I've never worn a mask" (09/17/2016)    Past Surgical History:  Procedure Laterality Date  . APPENDECTOMY    . CHOLECYSTECTOMY OPEN  ~ 2008   "it  exploded"  . CORONARY ANGIOPLASTY WITH STENT PLACEMENT  2009    2.75 x 28 mm Promus stent to the RCA  . FRACTURE SURGERY    . HERNIA REPAIR    . INSERTION OF MESH N/A 11/30/2016   Procedure: INSERTION OF MESH;  Surgeon: [name], MD;  Location: [facility] OR;  Service: General;  Laterality: N/A;  . LAPAROSCOPIC APPENDECTOMY N/A 09/16/2016   Procedure: APPENDECTOMY LAPAROSCOPIC;  Surgeon: [name], MD;  Location: [facility] OR;  Service: General;  Laterality: N/A;  . LAPAROSCOPIC INCISIONAL / UMBILICAL / VENTRAL HERNIA REPAIR  11/30/2016   w/repair serosal tear  . ORIF ANKLE FRACTURE Left 11/27/2015   Procedure: OPEN REDUCTION INTERNAL FIXATION (ORIF) ANKLE FRACTURE;  Surgeon: [name], MD;  Location:  SUR[facility]ERY C[facility];  Service: Orthopedics;  Laterality: Left;  [name] VENTRAL HERNIA REPAIR N/A 11/30/2016   Procedure: LAPAROSCOPIC VENTRAL HERNIA REPAIR AND  REPAIR OF SEROSAL TEAR;  Surgeon: [name], MD;  Location: [facility] OR;  Service: General;  Laterality: N/A;    Social History   Tobacco Use  Smoking Status Former Smoker  . Packs/day: 2.50  . Years: 36.00  . Pack years: 90.00  . Types: Cigarettes  . Quit date: 08/22/2007  . Years since quitting: 11.2  Smokeless Tobacco Never Used    Social History   Substance and Sexual Activity  Alcohol Use Yes  . Alcohol/week: 6.0 standard drinks  . Types: 6 Cans of beer per week    Family History  Problem Relation Age of Onset  . Heart disease Mother   . Hypertension Father   . Prostate cancer Brother   . CAD Brother     Reviw of Systems:  Reviewed in the HPI.  All other systems are negative.   Physical Exam: Blood pressure 124/85, pulse 65, height 5\' 9"  (1.753 m), weight 215 lb 12.8 oz (97.9 kg), SpO2 98 %.  GEN:  Middle age male.  NAD  HEENT: Normal NECK: No JVD; No carotid bruits LYMPHATICS: No lymphadenopathy CARDIAC: RRR  RESPIRATORY:  Clear to auscultation without rales, wheezing or rhonchi  ABDOMEN: Soft, non-tender,  non-distended MUSCULOSKELETAL:  No edema; No deformity  SKIN: Warm and dry NEUROLOGIC:  Alert and oriented x 3   EKG:   Assessment / Plan:   .1. Coronary artery disease-   No angina.   Is not using NTG. Walks 4 miles a day without any difficulties.   2. Hyperlipidemia-  Labs in Feb looked good , wil check again at his six-month appointment.  3. Sleep apnea  4. Hypertension -  BP is fairly well controlled.  Encouraged weight loss  , encouraged him to continue to avoid salt.  Continue current medications.   5.  ED :  wouild like to try Sildenafil 50 mg tabs He is not using any NTG      Follow up with me in 6 months for office visit, basic metabolic profile, lipid level, liver enzymes.  Kristeen Miss, MD  12/06/2018 12:21 PM    [facility] [address] [place],  [address] [place], [place]  [address] Pager [phone] Phone: [phone]; Fax: [phone]

## 2018-12-06 ENCOUNTER — Other Ambulatory Visit: Payer: Self-pay

## 2018-12-06 ENCOUNTER — Encounter: Payer: Self-pay | Admitting: Cardiovascular Disease

## 2018-12-06 ENCOUNTER — Ambulatory Visit: Payer: BC Managed Care – PPO | Admitting: Cardiovascular Disease

## 2018-12-06 VITALS — BP 124/85 | HR 65 | Ht 69.0 in | Wt 215.8 lb

## 2018-12-06 DIAGNOSIS — I2510 Atherosclerotic heart disease of native coronary artery without angina pectoris: Secondary | ICD-10-CM | POA: Diagnosis not present

## 2018-12-06 DIAGNOSIS — E785 Hyperlipidemia, unspecified: Secondary | ICD-10-CM

## 2018-12-06 MED ORDER — SILDENAFIL CITRATE 50 MG PO TABS: ORAL_TABLET | ORAL | 6 refills | Status: DC

## 2018-12-06 NOTE — Patient Instructions (Signed)
Medication Instructions:  1) You may take 1-2 tablets of Sildenafil 50mg  daily as needed   If you need a refill on your cardiac medications before your next appointment, please call your pharmacy.   Lab work: None If you have labs (blood work) drawn today and your tests are completely normal, you will receive your results only by: [name] MyChart Message (if you have MyChart) OR . A paper copy in the mail If you have any lab test that is abnormal or we need to change your treatment, we will call you to review the results.  Testing/Procedures: None  Follow-Up: At [facility], you and your health needs are our priority.  As part of our continuing mission to provide you with exceptional heart care, we have created designated Provider Care Teams.  These Care Teams include your primary Cardiologist (physician) and Advanced Practice Providers (APPs -  Physician Assistants and Nurse Practitioners) who all work together to provide you with the care you need, when you need it. You will need a follow up appointment in:  6 months.  Please call our office 2 months in advance to schedule this appointment.  You may see Mertie Moores, MD or one of the following Advanced Practice Providers on your designated Care Team: Richardson Dopp, PA-C [place], [place] . Daune Perch, NP  Any Other Special Instructions Will Be Listed Below (If Applicable).

## 2018-12-10 ENCOUNTER — Other Ambulatory Visit: Payer: Self-pay | Admitting: Cardiovascular Disease

## 2019-01-01 ENCOUNTER — Other Ambulatory Visit: Payer: Self-pay | Admitting: Family Medicine

## 2019-04-10 ENCOUNTER — Other Ambulatory Visit: Payer: Self-pay

## 2019-04-10 ENCOUNTER — Ambulatory Visit (INDEPENDENT_AMBULATORY_CARE_PROVIDER_SITE_OTHER): Payer: BC Managed Care – PPO | Admitting: Family Medicine

## 2019-04-10 ENCOUNTER — Encounter: Payer: Self-pay | Admitting: Family Medicine

## 2019-04-10 VITALS — BP 118/80 | HR 74 | Temp 98.4°F | Ht 69.0 in | Wt 220.0 lb

## 2019-04-10 DIAGNOSIS — M722 Plantar fascial fibromatosis: Secondary | ICD-10-CM

## 2019-04-10 DIAGNOSIS — E785 Hyperlipidemia, unspecified: Secondary | ICD-10-CM | POA: Diagnosis not present

## 2019-04-10 DIAGNOSIS — N529 Male erectile dysfunction, unspecified: Secondary | ICD-10-CM

## 2019-04-10 DIAGNOSIS — E119 Type 2 diabetes mellitus without complications: Secondary | ICD-10-CM

## 2019-04-10 DIAGNOSIS — G2581 Restless legs syndrome: Secondary | ICD-10-CM

## 2019-04-10 DIAGNOSIS — I252 Old myocardial infarction: Secondary | ICD-10-CM

## 2019-04-10 DIAGNOSIS — R12 Heartburn: Secondary | ICD-10-CM

## 2019-04-10 LAB — POCT GLYCOSYLATED HEMOGLOBIN (HGB A1C): Hemoglobin A1C: 6.4 % — AB (ref 4.0–5.6)

## 2019-04-10 MED ORDER — FLUTICASONE PROPIONATE 50 MCG/ACT NA SUSP: 1.0000 | Freq: Two times a day (BID) | NASAL | 6 refills | Status: DC

## 2019-04-10 MED ORDER — METFORMIN HCL 500 MG PO TABS: 500.0000 mg | ORAL_TABLET | Freq: Every day | ORAL | 1 refills | Status: DC

## 2019-04-10 MED ORDER — GABAPENTIN 300 MG PO CAPS: 300.0000 mg | ORAL_CAPSULE | Freq: Every day | ORAL | 1 refills | Status: DC

## 2019-04-10 MED ORDER — PANTOPRAZOLE SODIUM 40 MG PO TBEC: DELAYED_RELEASE_TABLET | ORAL | 3 refills | Status: DC

## 2019-04-10 MED ORDER — CHLORTHALIDONE 25 MG PO TABS: 25.0000 mg | ORAL_TABLET | Freq: Every day | ORAL | 1 refills | Status: DC

## 2019-04-10 MED ORDER — ATORVASTATIN CALCIUM 40 MG PO TABS: 40.0000 mg | ORAL_TABLET | Freq: Every day | ORAL | 3 refills | Status: DC

## 2019-04-10 MED ORDER — SILDENAFIL CITRATE 100 MG PO TABS: 50.0000 mg | ORAL_TABLET | Freq: Every day | ORAL | 1 refills | Status: DC | PRN

## 2019-04-10 MED ORDER — METOPROLOL SUCCINATE ER 25 MG PO TB24: 12.5000 mg | ORAL_TABLET | Freq: Two times a day (BID) | ORAL | 3 refills | Status: DC

## 2019-04-10 NOTE — Progress Notes (Signed)
1/18/20218:41 AM  Chris [name] [birth year], 64 y.o., male [id number]  Chief Complaint  Patient presents with  . Follow-up    chronic condition    HPI:   Patient is a 64 y.o. male with past medical history significant for DM2, HTN, HLP, CAD s/p MI, ED, GERD, chronic right foot pain, gout, RLS who presents today for routine followup  Last OV July 2020 with Dr Brain Hilts cards in sept 2020 Her reports that overall he is doing well Continues to struggle with right foot pain/numbness of toes Pain along bottom of foot and 1st MTP Numbness constant Also has RLS, not on meds, getting worse Pain exacerbated by right knee OA and h/o right ankle surgery Does not check cbgs exercises elliptical 20-30 minutes daily Requesting refill of viagra Has no acute concerns today  Depression screen [facility] 2/9 10/05/2018 08/24/2017 06/28/2017  Decreased Interest 0 0 0  Down, Depressed, Hopeless 0 0 0  PHQ - 2 Score 0 0 0    Fall Risk  04/10/2019 10/05/2018 08/24/2017 06/28/2017 06/18/2017  Falls in the past year? 0 0 No No No  Number falls in past yr: 0 - - - -  Injury with Fall? 0 0 - - -     Allergies  Allergen Reactions  . Ace Inhibitors Rash  . Ropinirole Hcl Rash    Prior to Admission medications   Medication Sig Start Date End Date Taking? Authorizing Provider  allopurinol (ZYLOPRIM) 300 MG tablet Take 300 mg by mouth daily. 05/04/18  Yes [provider]  aspirin 81 MG tablet Take 81 mg by mouth daily.    Yes [provider]  atorvastatin (LIPITOR) 40 MG tablet TAKE 1 TABLET BY MOUTH EVERY DAY 12/12/18  Yes Nahser, Deloris Ping, MD  chlorthalidone (HYGROTON) 25 MG tablet TAKE 1 TABLET BY MOUTH EVERY DAY 01/01/19  Yes Myles Lipps, MD  clopidogrel (PLAVIX) 75 MG tablet Take 1 tablet (75 mg total) by mouth daily. 06/28/18  Yes Nahser, Deloris Ping, MD  docusate sodium (COLACE) 50 MG capsule Take 1 capsule (50 mg total) by mouth 2 (two) times daily. 10/13/18  Yes Wallis Bamberg, PA-C    metFORMIN (GLUCOPHAGE) 500 MG tablet TAKE 1 TABLET BY MOUTH EVERY DAY WITH BREAKFAST 01/01/19  Yes Myles Lipps, MD  metoprolol succinate (TOPROL-XL) 25 MG 24 hr tablet TAKE 1/2 TABLET BY MOUTH TWICE A DAY 12/12/18  Yes Nahser, Deloris Ping, MD  multivitamin-iron-minerals-folic acid (CENTRUM) chewable tablet Chew 1 tablet by mouth daily.   Yes [provider]  nitroGLYCERIN (NITROSTAT) 0.4 MG SL tablet PLACE 1 TABLET (0.4 MG TOTAL) UNDER THE TONGUE EVERY 5 (FIVE) MINUTES AS NEEDED FOR CHEST PAIN. 12/28/17  Yes Nahser, Deloris Ping, MD  Omega-3 Fatty Acids (FISH OIL) 1200 MG CAPS Take 2 capsules by mouth 2 (two) times daily.   Yes [provider]  pantoprazole (PROTONIX) 40 MG tablet Take one po q day 10/05/18  Yes Corum, Minerva Fester, MD  sildenafil (VIAGRA) 50 MG tablet Take 1-2 tablets per day as needed 12/06/18  Yes Nahser, Deloris Ping, MD  sodium phosphate (FLEET) 7-19 GM/118ML ENEM Place 133 mLs (1 enema total) rectally daily as needed for severe constipation. 10/13/18  Yes Wallis Bamberg, PA-C  tadalafil (CIALIS) 20 MG tablet Take by mouth.   Yes [provider]    Past Medical History:  Diagnosis Date  . Acute MI, inferior wall, initial episode of care [facility]) 2009  . Anemia   .  Arthritis   . Coronary atherosclerosis of unspecified type of vessel, native or graft   . GERD (gastroesophageal reflux disease)   . High cholesterol   . Hypertension   . Seasonal allergies   . Sleep apnea    "I've never worn a mask" (09/17/2016)    Past Surgical History:  Procedure Laterality Date  . APPENDECTOMY    . CHOLECYSTECTOMY OPEN  ~ 2008   "it exploded"  . CORONARY ANGIOPLASTY WITH STENT PLACEMENT  2009    2.75 x 28 mm Promus stent to the RCA  . FRACTURE SURGERY    . HERNIA REPAIR    . INSERTION OF MESH N/A 11/30/2016   Procedure: INSERTION OF MESH;  Surgeon: Judeth Horn, MD;  Location: [place];  Service: General;  Laterality: N/A;  . LAPAROSCOPIC APPENDECTOMY N/A 09/16/2016    Procedure: APPENDECTOMY LAPAROSCOPIC;  Surgeon: Judeth Horn, MD;  Location: [place];  Service: General;  Laterality: N/A;  . LAPAROSCOPIC INCISIONAL / UMBILICAL / [place]  11/30/2016   w/repair serosal tear  . ORIF ANKLE FRACTURE Left 11/27/2015   Procedure: OPEN REDUCTION INTERNAL FIXATION (ORIF) ANKLE FRACTURE;  Surgeon: Frederik Pear, MD;  Location: [place];  Service: Orthopedics;  Laterality: Left;  [name] VENTRAL HERNIA REPAIR N/A 11/30/2016   Procedure: LAPAROSCOPIC VENTRAL HERNIA REPAIR AND  REPAIR OF SEROSAL TEAR;  Surgeon: Judeth Horn, MD;  Location: [place];  Service: General;  Laterality: N/A;    Social History   Tobacco Use  . Smoking status: Former Smoker    Packs/day: 2.50    Years: 36.00    Pack years: 90.00    Types: Cigarettes    Quit date: 08/22/2007    Years since quitting: 11.6  . Smokeless tobacco: Never Used  Substance Use Topics  . Alcohol use: Yes    Alcohol/week: 6.0 standard drinks    Types: 6 Cans of beer per week    Family History  Problem Relation Age of Onset  . Heart disease Mother   . Hypertension Father   . Prostate cancer Brother   . CAD Brother     Review of Systems  Constitutional: Negative for chills and fever.  Respiratory: Negative for cough and shortness of breath.   Cardiovascular: Negative for chest pain, palpitations and leg swelling.  Gastrointestinal: Negative for abdominal pain, nausea and vomiting.   Per hpi  OBJECTIVE:  Today's Vitals   04/10/19 0808  BP: 118/80  Pulse: 74  Temp: 98.4 F (36.9 C)  SpO2: 96%  Weight: 220 lb (99.8 kg)  Height: 5\' 9"  (1.753 m)   Body mass index is 32.49 kg/m.   Physical Exam Vitals and nursing note reviewed.  Constitutional:      Appearance: He is well-developed.  HENT:     Head: Normocephalic and atraumatic.  Eyes:     Conjunctiva/sclera: Conjunctivae normal.     Pupils: Pupils are equal, round, and reactive to light.  Cardiovascular:     Rate and  Rhythm: Normal rate and regular rhythm.     Heart sounds: No murmur. No friction rub. No gallop.   Pulmonary:     Effort: Pulmonary effort is normal.     Breath sounds: Normal breath sounds. No wheezing or rales.  Musculoskeletal:     Cervical back: Neck supple.  Skin:    General: Skin is warm and dry.  Neurological:     Mental Status: He is alert and oriented to person, place, and time.  Results for orders placed or performed in visit on 04/10/19 (from the past 24 hour(s))  POCT glycosylated hemoglobin (Hb A1C)     Status: Abnormal   Collection Time: 04/10/19  8:32 AM  Result Value Ref Range   Hemoglobin A1C 6.4 (A) 4.0 - 5.6 %   HbA1c POC (<> result, manual entry)     HbA1c, POC (prediabetic range)     HbA1c, POC (controlled diabetic range)      No results found.  Diabetic Foot Form - Detailed   Diabetic Foot Exam - detailed Can the patient see the bottom of their feet?: No Are the shoes appropriate in style and fit?: No Is there swelling or and abnormal foot shape?: Yes Is there a claw toe deformity?: No Is there elevated skin temparature?: No Is there foot or ankle muscle weakness?: No Normal Range of Motion: No Semmes-Weinstein Monofilament Test R Site 1-Great Toe: Neg L Site 1-Great Toe: Neg        ASSESSMENT and PLAN  1. Type 2 diabetes mellitus without complication, without long-term current use of insulin (HCC) Controlled. Continue current regime.  - Lipid panel - TSH - POCT glycosylated hemoglobin (Hb A1C) - HM DIABETES FOOT EXAM - Ambulatory referral to Ophthalmology  2. Hyperlipidemia, unspecified hyperlipidemia type Checking labs today, medications will be adjusted as needed. LDL goal < 70 - Lipid panel - TSH - CMET with GFR  3. Plantar fasciitis - Ambulatory referral to Podiatry  4. RESTLESS LEGS SYNDROME Uncontrolled. Starting gabapentin as it will also help with paraesthesias, reviewed r/se/b  5. History of MI (myocardial  infarction) Stable. Managed by cards  6. Erectile dysfunction, unspecified erectile dysfunction type viagra refilled. Reviewed r/se/b. Discussed good rx  7. Heartburn Controlled. Continue current regime.  - pantoprazole (PROTONIX) 40 MG tablet; Take one po q day  Other orders - sildenafil (VIAGRA) 100 MG tablet; Take 0.5-1 tablets (50-100 mg total) by mouth daily as needed for erectile dysfunction. - atorvastatin (LIPITOR) 40 MG tablet; Take 1 tablet (40 mg total) by mouth daily. - chlorthalidone (HYGROTON) 25 MG tablet; Take 1 tablet (25 mg total) by mouth daily. - metFORMIN (GLUCOPHAGE) 500 MG tablet; Take 1 tablet (500 mg total) by mouth daily with breakfast. - metoprolol succinate (TOPROL-XL) 25 MG 24 hr tablet; Take 0.5 tablets (12.5 mg total) by mouth 2 (two) times daily. - gabapentin (NEURONTIN) 300 MG capsule; Take 1-2 capsules (300-600 mg total) by mouth at bedtime. - fluticasone (FLONASE) 50 MCG/ACT nasal spray; Place 1 spray into both nostrils 2 (two) times daily.  Return in about 6 months (around 10/08/2019).    Myles Lipps, MD Primary Care at [facility] [address] [place], [place] [address] Ph.  [phone] Fax [phone]

## 2019-04-10 NOTE — Patient Instructions (Signed)
° ° ° °  If you have lab work done today you will be contacted with your lab results within the next 2 weeks.  If you have not heard from us then please contact us. The fastest way to get your results is to register for My Chart. ° ° °IF you received an x-ray today, you will receive an invoice from [facility]. Please contact  Radiology at [phone] with questions or concerns regarding your invoice.  ° °IF you received labwork today, you will receive an invoice from [facility]. Please contact [facility] at [phone] with questions or concerns regarding your invoice.  ° °Our billing staff will not be able to assist you with questions regarding bills from these companies. ° °You will be contacted with the lab results as soon as they are available. The fastest way to get your results is to activate your My Chart account. Instructions are located on the last page of this paperwork. If you have not heard from us regarding the results in 2 weeks, please contact this office. °  ° ° ° °

## 2019-04-11 LAB — CMP14+EGFR
ALT: 31 IU/L (ref 0–44)
AST: 28 IU/L (ref 0–40)
Albumin/Globulin Ratio: 1.6 (ref 1.2–2.2)
Albumin: 4.4 g/dL (ref 3.8–4.8)
Alkaline Phosphatase: 80 IU/L (ref 39–117)
BUN/Creatinine Ratio: 14 (ref 10–24)
BUN: 14 mg/dL (ref 8–27)
Bilirubin Total: 0.5 mg/dL (ref 0.0–1.2)
CO2: 24 mmol/L (ref 20–29)
Calcium: 9.8 mg/dL (ref 8.6–10.2)
Chloride: 99 mmol/L (ref 96–106)
Creatinine, Ser: 0.98 mg/dL (ref 0.76–1.27)
GFR calc Af Amer: 94 mL/min/{1.73_m2} (ref >59)
GFR calc non Af Amer: 82 mL/min/{1.73_m2} (ref >59)
Globulin, Total: 2.8 g/dL (ref 1.5–4.5)
Glucose: 121 mg/dL — ABNORMAL HIGH (ref 65–99)
Potassium: 3.8 mmol/L (ref 3.5–5.2)
Sodium: 137 mmol/L (ref 134–144)
Total Protein: 7.2 g/dL (ref 6.0–8.5)

## 2019-04-11 LAB — LIPID PANEL
Chol/HDL Ratio: 3.6 ratio (ref 0.0–5.0)
Cholesterol, Total: 138 mg/dL (ref 100–199)
HDL: 38 mg/dL — ABNORMAL LOW (ref >39)
LDL Chol Calc (NIH): 71 mg/dL (ref 0–99)
Triglycerides: 169 mg/dL — ABNORMAL HIGH (ref 0–149)
VLDL Cholesterol Cal: 29 mg/dL (ref 5–40)

## 2019-04-11 LAB — TSH: TSH: 2.1 u[IU]/mL (ref 0.450–4.500)

## 2019-04-13 ENCOUNTER — Ambulatory Visit: Payer: BC Managed Care – PPO | Admitting: Podiatry

## 2019-04-13 ENCOUNTER — Encounter: Payer: Self-pay | Admitting: Podiatry

## 2019-04-13 ENCOUNTER — Ambulatory Visit (INDEPENDENT_AMBULATORY_CARE_PROVIDER_SITE_OTHER): Payer: BC Managed Care – PPO

## 2019-04-13 ENCOUNTER — Other Ambulatory Visit: Payer: Self-pay

## 2019-04-13 DIAGNOSIS — E1142 Type 2 diabetes mellitus with diabetic polyneuropathy: Secondary | ICD-10-CM

## 2019-04-13 DIAGNOSIS — M722 Plantar fascial fibromatosis: Secondary | ICD-10-CM

## 2019-04-13 NOTE — Progress Notes (Signed)
Subjective:  Patient ID: Chris [name], male    DOB: 1956-03-16,  MRN: [id number] HPI Chief Complaint  Patient presents with  . Foot Pain    Forefoot/toes bilateral (L>R) - aching x few years, worse now, cramping in smaller toes, previous ankle surgery left   . New Patient (Initial Visit)    64 y.o. male presents with the above complaint.   ROS: He denies fever chills nausea vomiting muscle aches pains calf pain back pain chest pain shortness of breath.  Past Medical History:  Diagnosis Date  . Acute MI, inferior wall, initial episode of care [facility]) 2009  . Anemia   . Arthritis   . Coronary atherosclerosis of unspecified type of vessel, native or graft   . GERD (gastroesophageal reflux disease)   . High cholesterol   . Hypertension   . Seasonal allergies   . Sleep apnea    "I've never worn a mask" (09/17/2016)   Past Surgical History:  Procedure Laterality Date  . APPENDECTOMY    . CHOLECYSTECTOMY OPEN  ~ 2008   "it exploded"  . CORONARY ANGIOPLASTY WITH STENT PLACEMENT  2009    2.75 x 28 mm Promus stent to the RCA  . FRACTURE SURGERY    . HERNIA REPAIR    . INSERTION OF MESH N/A 11/30/2016   Procedure: INSERTION OF MESH;  Surgeon: [name], MD;  Location: [facility] OR;  Service: General;  Laterality: N/A;  . LAPAROSCOPIC APPENDECTOMY N/A 09/16/2016   Procedure: APPENDECTOMY LAPAROSCOPIC;  Surgeon: [name], MD;  Location: [facility] OR;  Service: General;  Laterality: N/A;  . LAPAROSCOPIC INCISIONAL / UMBILICAL / VENTRAL HERNIA REPAIR  11/30/2016   w/repair serosal tear  . ORIF ANKLE FRACTURE Left 11/27/2015   Procedure: OPEN REDUCTION INTERNAL FIXATION (ORIF) ANKLE FRACTURE;  Surgeon: [name], MD;  Location: [facility];  Service: Orthopedics;  Laterality: Left;  [name] VENTRAL HERNIA REPAIR N/A 11/30/2016   Procedure: LAPAROSCOPIC VENTRAL HERNIA REPAIR AND  REPAIR OF SEROSAL TEAR;  Surgeon: [name], MD;  Location: [facility] OR;  Service: General;  Laterality: N/A;     Current Outpatient Medications:  .  allopurinol (ZYLOPRIM) 300 MG tablet, Take 300 mg by mouth daily., Disp: , Rfl:  .  aspirin 81 MG tablet, Take 81 mg by mouth daily. , Disp: , Rfl:  .  atorvastatin (LIPITOR) 40 MG tablet, Take 1 tablet (40 mg total) by mouth daily., Disp: 90 tablet, Rfl: 3 .  chlorthalidone (HYGROTON) 25 MG tablet, Take 1 tablet (25 mg total) by mouth daily., Disp: 90 tablet, Rfl: 1 .  clopidogrel (PLAVIX) 75 MG tablet, Take 1 tablet (75 mg total) by mouth daily., Disp: 90 tablet, Rfl: 3 .  docusate sodium (COLACE) 50 MG capsule, Take 1 capsule (50 mg total) by mouth 2 (two) times daily., Disp: 60 capsule, Rfl: 5 .  fluticasone (FLONASE) 50 MCG/ACT nasal spray, Place 1 spray into both nostrils 2 (two) times daily., Disp: 16 g, Rfl: 6 .  gabapentin (NEURONTIN) 300 MG capsule, Take 1-2 capsules (300-600 mg total) by mouth at bedtime., Disp: 180 capsule, Rfl: 1 .  metFORMIN (GLUCOPHAGE) 500 MG tablet, Take 1 tablet (500 mg total) by mouth daily with breakfast., Disp: 90 tablet, Rfl: 1 .  metoprolol succinate (TOPROL-XL) 25 MG 24 hr tablet, Take 0.5 tablets (12.5 mg total) by mouth 2 (two) times daily., Disp: 90 tablet, Rfl: 3 .  multivitamin-iron-minerals-folic acid (CENTRUM) chewable tablet, Chew 1 tablet by mouth daily., Disp: , Rfl:  .  nitroGLYCERIN (NITROSTAT) 0.4 MG SL tablet, PLACE 1 TABLET (0.4 MG TOTAL) UNDER THE TONGUE EVERY 5 (FIVE) MINUTES AS NEEDED FOR CHEST PAIN., Disp: 100 tablet, Rfl: 3 .  Omega-3 Fatty Acids (FISH OIL) 1200 MG CAPS, Take 2 capsules by mouth 2 (two) times daily., Disp: , Rfl:  .  pantoprazole (PROTONIX) 40 MG tablet, Take one po q day, Disp: 90 tablet, Rfl: 3 .  sildenafil (VIAGRA) 100 MG tablet, Take 0.5-1 tablets (50-100 mg total) by mouth daily as needed for erectile dysfunction., Disp: 30 tablet, Rfl: 1 .  sodium phosphate (FLEET) 7-19 GM/118ML ENEM, Place 133 mLs (1 enema total) rectally daily as needed for severe constipation., Disp: 5  enema, Rfl: 5  Allergies  Allergen Reactions  . Ace Inhibitors Rash  . Ropinirole Hcl Rash   Review of Systems Objective:  There were no vitals filed for this visit.  General: Well developed, nourished, in no acute distress, alert and oriented x3   Dermatological: Skin is warm, dry and supple bilateral. Nails x 10 are well maintained; remaining integument appears unremarkable at this time. There are no open sores, no preulcerative lesions, no rash or signs of infection present.  Vascular: Dorsalis Pedis artery and Posterior Tibial artery pedal pulses are 2/4 bilateral with immedate capillary fill time. Pedal hair growth present. No varicosities and no lower extremity edema present bilateral.   Neruologic: Grossly intact via light touch bilateral. Vibratory intact via tuning fork bilateral. Protective threshold with Semmes Wienstein monofilament intact to all pedal sites bilateral. Patellar and Achilles deep tendon reflexes 2+ bilateral. No Babinski or clonus noted bilateral.   Musculoskeletal: No gross boney pedal deformities bilateral. No pain, crepitus, or limitation noted with foot and ankle range of motion bilateral. Muscular strength 5/5 in all groups tested bilateral.  Gait: Unassisted, Nonantalgic.    Radiographs:  Radiographs taken today do not demonstrate any type of acute osseous abnormalities  Assessment & Plan:   Assessment: Diabetic peripheral neuropathy  Plan: Continue the gabapentin which she was not taking.  300 mg nightly from urgent care nurse practitioner.  Follow-up with him in 1 month for reevaluation.  Blood work performed stated that his A1c was 6.4.     Terrion Gencarelli T. [place], [place]

## 2019-04-17 DIAGNOSIS — L821 Other seborrheic keratosis: Secondary | ICD-10-CM | POA: Diagnosis not present

## 2019-04-17 DIAGNOSIS — L578 Other skin changes due to chronic exposure to nonionizing radiation: Secondary | ICD-10-CM | POA: Diagnosis not present

## 2019-04-17 DIAGNOSIS — D225 Melanocytic nevi of trunk: Secondary | ICD-10-CM | POA: Diagnosis not present

## 2019-04-17 DIAGNOSIS — L814 Other melanin hyperpigmentation: Secondary | ICD-10-CM | POA: Diagnosis not present

## 2019-05-17 DIAGNOSIS — H0102A Squamous blepharitis right eye, upper and lower eyelids: Secondary | ICD-10-CM | POA: Diagnosis not present

## 2019-05-17 DIAGNOSIS — D3131 Benign neoplasm of right choroid: Secondary | ICD-10-CM | POA: Diagnosis not present

## 2019-05-17 DIAGNOSIS — E119 Type 2 diabetes mellitus without complications: Secondary | ICD-10-CM | POA: Diagnosis not present

## 2019-05-17 DIAGNOSIS — H353131 Nonexudative age-related macular degeneration, bilateral, early dry stage: Secondary | ICD-10-CM | POA: Diagnosis not present

## 2019-05-17 LAB — HM DIABETES EYE EXAM

## 2019-05-22 NOTE — Progress Notes (Addendum)
[facility] Note  05/23/2019     CHIEF COMPLAINT Patient presents for Retina Evaluation   HISTORY OF PRESENT ILLNESS: Chris [name] is a 64 y.o. male who presents to the clinic today for:   HPI    Retina Evaluation    In right eye.  I, the attending physician,  performed the HPI with the patient and updated documentation appropriately.          Comments    HgA1c: 6.4 BS: Doesn't check Pt states his vision has been fine OU and denies any issues reading or driving.  Patient denies eye pain or discomfort and denies any new or worsening floaters or fol OU.       Last edited by Bernarda Caffey, MD on 05/23/2019  8:31 AM. (History)    pt states he saw Shirleen Schirmer last week, pt states Lundquist told him he saw a spot with blood in the back of his eye, pt states he has had metal in his eye multiple times and not too long ago his wife stuck her fingernail in his eye, pt states he saw Lundquist for a diabetic eye exam per his PCP, pt is on metformin, pt denies fol or floaters  Referring physician: Shirleen Schirmer, PA-C  [address] [place],   [id number]  HISTORICAL INFORMATION:   Selected notes from the MEDICAL RECORD NUMBER Referral by Shirleen Schirmer, PA for eval of ARMD OU/ret hem inferior to choroidal nevus OD. DM, HTN NS OU [facility] OD: 20/30-            OS: 20/25 MRx OD: -0.50+0.75x175 20/20         OS: -0.50+0.50x068 20/20         ADD: +2.25 IOPs 18, 17   CURRENT MEDICATIONS: No current outpatient medications on file. (Ophthalmic Drugs)   No current facility-administered medications for this visit. (Ophthalmic Drugs)   Current Outpatient Medications (Other)  Medication Sig  . allopurinol (ZYLOPRIM) 300 MG tablet Take 300 mg by mouth daily.  [name] aspirin 81 MG tablet Take 81 mg by mouth daily.   [name] atorvastatin (LIPITOR) 40 MG tablet Take 1 tablet (40 mg total) by mouth daily.  . chlorthalidone (HYGROTON) 25 MG tablet Take 1  tablet (25 mg total) by mouth daily.  . clopidogrel (PLAVIX) 75 MG tablet Take 1 tablet (75 mg total) by mouth daily.  [name] docusate sodium (COLACE) 50 MG capsule Take 1 capsule (50 mg total) by mouth 2 (two) times daily.  . fluticasone (FLONASE) 50 MCG/ACT nasal spray Place 1 spray into both nostrils 2 (two) times daily.  [name] gabapentin (NEURONTIN) 300 MG capsule Take 1-2 capsules (300-600 mg total) by mouth at bedtime.  . metFORMIN (GLUCOPHAGE) 500 MG tablet Take 1 tablet (500 mg total) by mouth daily with breakfast.  . metoprolol succinate (TOPROL-XL) 25 MG 24 hr tablet Take 0.5 tablets (12.5 mg total) by mouth 2 (two) times daily.  . multivitamin-iron-minerals-folic acid (CENTRUM) chewable tablet Chew 1 tablet by mouth daily.  . nitroGLYCERIN (NITROSTAT) 0.4 MG SL tablet PLACE 1 TABLET (0.4 MG TOTAL) UNDER THE TONGUE EVERY 5 (FIVE) MINUTES AS NEEDED FOR CHEST PAIN.  [name] Omega-3 Fatty Acids (FISH OIL) 1200 MG CAPS Take 2 capsules by mouth 2 (two) times daily.  . pantoprazole (PROTONIX) 40 MG tablet Take one po q day  . sildenafil (VIAGRA) 100 MG tablet Take 0.5-1 tablets (50-100 mg total) by mouth daily as needed for  erectile dysfunction.  . sodium phosphate (FLEET) 7-19 GM/118ML ENEM Place 133 mLs (1 enema total) rectally daily as needed for severe constipation.   No current facility-administered medications for this visit. (Other)      REVIEW OF SYSTEMS: ROS    Positive for: Endocrine, Eyes   Negative for: Constitutional, Gastrointestinal, Neurological, Skin, Genitourinary, Musculoskeletal, HENT, Cardiovascular, Respiratory, Psychiatric, Allergic/Imm, Heme/Lymph   Last edited by Doneen Poisson on 05/23/2019  8:20 AM. (History)       ALLERGIES Allergies  Allergen Reactions  . Ace Inhibitors Rash  . Ropinirole Hcl Rash    PAST MEDICAL HISTORY Past Medical History:  Diagnosis Date  . Acute MI, inferior wall, initial episode of care [facility]) 2009  . Anemia   . Arthritis   . Coronary  atherosclerosis of unspecified type of vessel, native or graft   . GERD (gastroesophageal reflux disease)   . High cholesterol   . Hypertension   . Seasonal allergies   . Sleep apnea    "I've never worn a mask" (09/17/2016)   Past Surgical History:  Procedure Laterality Date  . APPENDECTOMY    . CHOLECYSTECTOMY OPEN  ~ 2008   "it exploded"  . CORONARY ANGIOPLASTY WITH STENT PLACEMENT  2009    2.75 x 28 mm Promus stent to the RCA  . FRACTURE SURGERY    . HERNIA REPAIR    . INSERTION OF MESH N/A 11/30/2016   Procedure: INSERTION OF MESH;  Surgeon: Judeth Horn, MD;  Location: [facility];  Service: General;  Laterality: N/A;  . LAPAROSCOPIC APPENDECTOMY N/A 09/16/2016   Procedure: APPENDECTOMY LAPAROSCOPIC;  Surgeon: Judeth Horn, MD;  Location: [facility];  Service: General;  Laterality: N/A;  . LAPAROSCOPIC INCISIONAL / UMBILICAL / [place]  11/30/2016   w/repair serosal tear  . ORIF ANKLE FRACTURE Left 11/27/2015   Procedure: OPEN REDUCTION INTERNAL FIXATION (ORIF) ANKLE FRACTURE;  Surgeon: Frederik Pear, MD;  Location: [facility];  Service: Orthopedics;  Laterality: Left;  [name] VENTRAL HERNIA REPAIR N/A 11/30/2016   Procedure: LAPAROSCOPIC VENTRAL HERNIA REPAIR AND  REPAIR OF SEROSAL TEAR;  Surgeon: Judeth Horn, MD;  Location: [facility];  Service: General;  Laterality: N/A;    FAMILY HISTORY Family History  Problem Relation Age of Onset  . Heart disease Mother   . Hypertension Father   . Prostate cancer Brother   . CAD Brother     SOCIAL HISTORY Social History   Tobacco Use  . Smoking status: Former Smoker    Packs/day: 2.50    Years: 36.00    Pack years: 90.00    Types: Cigarettes    Quit date: 08/22/2007    Years since quitting: 11.7  . Smokeless tobacco: Never Used  Substance Use Topics  . Alcohol use: Yes    Alcohol/week: 6.0 standard drinks    Types: 6 Cans of beer per week  . Drug use: Yes    Types: Amphetamines         OPHTHALMIC EXAM:  Base  Eye Exam    Visual Acuity (Snellen - Linear)      Right Left   Dist [place] 20/25 -1 20/25 -1   Dist ph [place] 20/20 -2 20/20 -2       Tonometry (Tonopen, 8:18 AM)      Right Left   Pressure 14 12       Pupils      Dark Light Shape React APD   Right 4 3 Round Brisk 0  Left 4 3 Round Brisk 0       Visual Fields      Left Right    Full Full       Extraocular Movement      Right Left    Full Full       Neuro/Psych    Oriented x3: Yes   Mood/Affect: Normal       Dilation    Both eyes: 1.0% Mydriacyl, 2.5% Phenylephrine @ 8:18 AM        Slit Lamp and Fundus Exam    Slit Lamp Exam      Right Left   Lids/Lashes Dermatochalasis - upper lid, mild Meibomian gland dysfunction Dermatochalasis - upper lid, mild Meibomian gland dysfunction   Conjunctiva/Sclera White and quiet White and quiet   Cornea Mild Arcus, mild Debris in tear film Mild Arcus, mild Debris in tear film, 1+ inferior Punctate epithelial erosions   Anterior Chamber deep, narrow temporal angle deep, narrow temporal angle   Iris Round and dilated, No NVI Round and dilated, No NVI   Lens 1-2+ Nuclear sclerosis, 2+ Cortical cataract 1-2+ Nuclear sclerosis, 2+ Cortical cataract   Vitreous Vitreous syneresis Vitreous syneresis       Fundus Exam      Right Left   Disc Pink and Sharp Pink and Sharp   C/D Ratio 0.3 0.1   Macula Flat, Blunted foveal reflex, mild Retinal pigment epithelial mottling, No heme or edema Flat, Blunted foveal reflex, mild Retinal pigment epithelial mottling, No heme or edema   Vessels Mild Vascular attenuation Mild Vascular attenuation   Periphery Attached, CHRPE at 0830, focal blot heme anterior to lesion, VR tufts at 0300 just posterior to ora Attached, no heme; no RT/RD          Refraction    Wearing Rx      Sphere Cylinder   Right +1.25 Sphere   Left +1.25 Sphere   Type: OTC       Manifest Refraction      Sphere Cylinder Axis Dist VA   Right -0.50 +0.75 175 20/20   Left -0.50  +0.50 065 20/20          IMAGING AND PROCEDURES  Imaging and Procedures for _0 @  OCT, Retina - OU - Both Eyes       Right Eye Quality was good. Central Foveal Thickness: 288. Progression has no prior data. Findings include normal foveal contour, no IRF, no SRF (Widefield scan through pigmented temporal lesion shows atrophy, no SRF).   Left Eye Quality was good. Central Foveal Thickness: 290. Progression has no prior data. Findings include normal foveal contour, no IRF, no SRF, vitreomacular adhesion .   Notes *Images captured and stored on drive  Diagnosis / Impression:  NFP, no IRF/SRF OU OD: Widefield scan through pigmented temporal lesion shows atrophy, no SRF  Clinical management:  See below  Abbreviations: NFP - Normal foveal profile. CME - cystoid macular edema. PED - pigment epithelial detachment. IRF - intraretinal fluid. SRF - subretinal fluid. EZ - ellipsoid zone. ERM - epiretinal membrane. ORA - outer retinal atrophy. ORT - outer retinal tubulation. SRHM - subretinal hyper-reflective material        Color Fundus Photography Optos - OU - Both Eyes       Right Eye Progression has no prior data. Disc findings include normal observations. Macula : normal observations. Vessels : normal observations. Periphery : RPE abnormality (Oval shaped CHRPE at 0830 equator).   Left Eye Progression  has no prior data. Disc findings include normal observations. Macula : normal observations. Vessels : normal observations. Periphery : normal observations.   Notes **Images stored on drive**  Impression: OD: Oval shaped CHRPE at 0830 equator OS: normal study                  ASSESSMENT/PLAN:    ICD-10-CM   1. Congenital hypertrophy of retinal pigment epithelium  Q14.1 Color Fundus Photography Optos - OU - Both Eyes  2. Retinal edema  H35.81 OCT, Retina - OU - Both Eyes  3. Vitreoretinal tuft of right eye  Q14.1   4. Diabetes mellitus type 2 without retinopathy  ([place])  E11.9   5. Essential hypertension  I10   6. Hypertensive retinopathy of both eyes  H35.033   7. Combined forms of age-related cataract of both eyes  H25.813     1. CHRPE OD  - focal, delineated pigmented lesion at 830 periphery consistent with congenital hypertrophy of RPE (CHRPE) -- benign lesion  - discussed findings, diagnosis, prognosis  - no intervention indicated  - Optos images obtained today  - recommend monitoring  - f/u 1 year  2. No retinal edema on exam or OCT  3. VR tufts OD  - located at 0300 posterior to ora -- no frank tear or RD  - pt is asymptomatic  - discussed findings, diagnosis, prognosis, and potential treatment options including laser retinopexy  - recommend monitoring for now  - f/u 3-4 weeks for repeat DFE  4. Diabetes mellitus, type 2 without retinopathy  - The incidence, risk factors for progression, natural history and treatment options for diabetic retinopathy  were discussed with patient.    - The need for close monitoring of blood glucose, blood pressure, and serum lipids, avoiding cigarette or any type of tobacco, and the need for long term follow up was also discussed with patient.  - f/u in 1 year, sooner prn  5,6. Hypertensive retinopathy OU  - discussed importance of tight BP control  - monitor  7. Mixed form age related cataracts OU  - The symptoms of cataract, surgical options, and treatments and risks were discussed with patient.  - discussed diagnosis and progression  - not yet visually significant  - monitor for now    Ophthalmic Meds Ordered this visit:  No orders of the defined types were placed in this encounter.      Return for f/u 3-4 weeks, VR tuft OD, DFE, OCT.  There are no Patient Instructions on file for this visit.   Explained the diagnoses, plan, and follow up with the patient and they expressed understanding.  Patient expressed understanding of the importance of proper follow up care.   This document  serves as a record of services personally performed by Gardiner Sleeper, MD, PhD. It was created on their behalf by Estill Bakes, COT an ophthalmic technician. The creation of this record is the provider's dictation and/or activities during the visit.    Electronically signed by: Estill Bakes, COT 05/22/19 @ 12:01 PM   This document serves as a record of services personally performed by Gardiner Sleeper, MD, PhD. It was created on their behalf by Ernest Mallick, OA, an ophthalmic assistant. The creation of this record is the provider's dictation and/or activities during the visit.    Electronically signed by: Ernest Mallick, OA 03.02.2021 12:01 PM  Gardiner Sleeper, M.D., Ph.D. Diseases & Surgery of the Retina and [employer]  05/23/2019   I have reviewed the above documentation for accuracy and completeness, and I agree with the above. Gardiner Sleeper, M.D., Ph.D. 05/23/19 12:01 PM   Abbreviations: M myopia (nearsighted); A astigmatism; H hyperopia (farsighted); P presbyopia; Mrx spectacle prescription;  CTL contact lenses; OD right eye; OS left eye; OU both eyes  XT exotropia; ET esotropia; PEK punctate epithelial keratitis; PEE punctate epithelial erosions; DES dry eye syndrome; MGD meibomian gland dysfunction; ATs artificial tears; PFAT's preservative free artificial tears; [place] nuclear sclerotic cataract; PSC posterior subcapsular cataract; ERM epi-retinal membrane; PVD posterior vitreous detachment; RD retinal detachment; DM diabetes mellitus; DR diabetic retinopathy; NPDR non-proliferative diabetic retinopathy; PDR proliferative diabetic retinopathy; CSME clinically significant macular edema; DME diabetic macular edema; dbh dot blot hemorrhages; CWS cotton wool spot; POAG primary open angle glaucoma; C/D cup-to-disc ratio; HVF humphrey visual field; GVF goldmann visual field; OCT optical coherence tomography; IOP intraocular pressure; BRVO Branch retinal vein occlusion;  CRVO central retinal vein occlusion; CRAO central retinal artery occlusion; BRAO branch retinal artery occlusion; RT retinal tear; SB scleral buckle; PPV pars plana vitrectomy; VH Vitreous hemorrhage; PRP panretinal laser photocoagulation; IVK intravitreal kenalog; VMT vitreomacular traction; MH Macular hole;  NVD neovascularization of the disc; NVE neovascularization elsewhere; AREDS age related eye disease study; ARMD age related macular degeneration; POAG primary open angle glaucoma; EBMD epithelial/anterior basement membrane dystrophy; ACIOL anterior chamber intraocular lens; IOL intraocular lens; PCIOL posterior chamber intraocular lens; Phaco/IOL phacoemulsification with intraocular lens placement; [place] photorefractive keratectomy; LASIK laser assisted in situ keratomileusis; HTN hypertension; DM diabetes mellitus; COPD chronic obstructive pulmonary disease

## 2019-05-23 ENCOUNTER — Ambulatory Visit (INDEPENDENT_AMBULATORY_CARE_PROVIDER_SITE_OTHER): Payer: BC Managed Care – PPO | Admitting: Ophthalmology

## 2019-05-23 ENCOUNTER — Encounter (INDEPENDENT_AMBULATORY_CARE_PROVIDER_SITE_OTHER): Payer: Self-pay | Admitting: Ophthalmology

## 2019-05-23 DIAGNOSIS — E119 Type 2 diabetes mellitus without complications: Secondary | ICD-10-CM | POA: Diagnosis not present

## 2019-05-23 DIAGNOSIS — H35033 Hypertensive retinopathy, bilateral: Secondary | ICD-10-CM

## 2019-05-23 DIAGNOSIS — H3581 Retinal edema: Secondary | ICD-10-CM

## 2019-05-23 DIAGNOSIS — I10 Essential (primary) hypertension: Secondary | ICD-10-CM

## 2019-05-23 DIAGNOSIS — Q141 Congenital malformation of retina: Secondary | ICD-10-CM

## 2019-05-23 DIAGNOSIS — H25813 Combined forms of age-related cataract, bilateral: Secondary | ICD-10-CM

## 2019-06-21 ENCOUNTER — Encounter (INDEPENDENT_AMBULATORY_CARE_PROVIDER_SITE_OTHER): Payer: BC Managed Care – PPO | Admitting: Ophthalmology

## 2019-06-26 NOTE — Progress Notes (Signed)
[facility] Note  07/03/2019     CHIEF COMPLAINT Patient presents for Retina Follow Up   HISTORY OF PRESENT ILLNESS: Chris [name] is a 64 y.o. male who presents to the clinic today for:   HPI    Retina Follow Up    Patient presents with  Other.  In right eye.  Severity is mild.  Duration of 6 weeks.  Since onset it is stable.  I, the attending physician,  performed the HPI with the patient and updated documentation appropriately.          Comments    Pt states vision stable since last exam, he states it feels like there is a film over his eyes, but is not seeing fol, floaters or eye pain       Last edited by Bernarda Caffey, MD on 07/03/2019  8:44 AM. (History)    pt states    Referring physician: Shirleen Schirmer, PA-C  [address] [place],  [place] [address]  HISTORICAL INFORMATION:   Selected notes from the [facility] Referral by Shirleen Schirmer, PA for eval of ARMD OU/ret hem inferior to choroidal nevus OD. DM, HTN NS OU [facility] OD: 20/30-            OS: 20/25 MRx OD: -0.50+0.75x175 20/20         OS: -0.50+0.50x068 20/20         ADD: +2.25 IOPs 18, 17   CURRENT MEDICATIONS: Current Outpatient Medications (Ophthalmic Drugs)  Medication Sig  . prednisoLONE acetate (PRED FORTE) 1 % ophthalmic suspension Place 1 drop into the right eye 4 (four) times daily for 7 days.   No current facility-administered medications for this visit. (Ophthalmic Drugs)   Current Outpatient Medications (Other)  Medication Sig  . allopurinol (ZYLOPRIM) 300 MG tablet Take 300 mg by mouth daily.  [name] aspirin 81 MG tablet Take 81 mg by mouth daily.   [name] atorvastatin (LIPITOR) 40 MG tablet Take 1 tablet (40 mg total) by mouth daily.  . chlorthalidone (HYGROTON) 25 MG tablet Take 1 tablet (25 mg total) by mouth daily.  . clopidogrel (PLAVIX) 75 MG tablet Take 1 tablet (75 mg total) by mouth daily.  [name] docusate sodium (COLACE) 50 MG capsule Take 1  capsule (50 mg total) by mouth 2 (two) times daily.  . fluticasone (FLONASE) 50 MCG/ACT nasal spray Place 1 spray into both nostrils 2 (two) times daily.  [name] gabapentin (NEURONTIN) 300 MG capsule Take 1-2 capsules (300-600 mg total) by mouth at bedtime.  . metFORMIN (GLUCOPHAGE) 500 MG tablet Take 1 tablet (500 mg total) by mouth daily with breakfast.  . metoprolol succinate (TOPROL-XL) 25 MG 24 hr tablet Take 0.5 tablets (12.5 mg total) by mouth 2 (two) times daily.  . multivitamin-iron-minerals-folic acid (CENTRUM) chewable tablet Chew 1 tablet by mouth daily.  . nitroGLYCERIN (NITROSTAT) 0.4 MG SL tablet PLACE 1 TABLET (0.4 MG TOTAL) UNDER THE TONGUE EVERY 5 (FIVE) MINUTES AS NEEDED FOR CHEST PAIN.  [name] Omega-3 Fatty Acids (FISH OIL) 1200 MG CAPS Take 2 capsules by mouth 2 (two) times daily.  . pantoprazole (PROTONIX) 40 MG tablet Take one po q day  . sildenafil (VIAGRA) 100 MG tablet Take 0.5-1 tablets (50-100 mg total) by mouth daily as needed for erectile dysfunction.  . sodium phosphate (FLEET) 7-19 GM/118ML ENEM Place 133 mLs (1 enema total) rectally daily as needed for severe constipation.   No current facility-administered medications  for this visit. (Other)      REVIEW OF SYSTEMS: ROS    Positive for: Cardiovascular, Eyes, Heme/Lymph   Negative for: Constitutional, Gastrointestinal, Neurological, Skin, Genitourinary, Musculoskeletal, HENT, Endocrine, Respiratory, Psychiatric, Allergic/Imm   Last edited by Debbrah Alar, COT on 07/03/2019  8:35 AM. (History)       ALLERGIES Allergies  Allergen Reactions  . Ace Inhibitors Rash  . Ropinirole Hcl Rash    PAST MEDICAL HISTORY Past Medical History:  Diagnosis Date  . Acute MI, inferior wall, initial episode of care [facility]) 2009  . Anemia   . Arthritis   . Coronary atherosclerosis of unspecified type of vessel, native or graft   . GERD (gastroesophageal reflux disease)   . High cholesterol   . Hypertension   . Seasonal  allergies   . Sleep apnea    "I've never worn a mask" (09/17/2016)   Past Surgical History:  Procedure Laterality Date  . APPENDECTOMY    . CHOLECYSTECTOMY OPEN  ~ 2008   "it exploded"  . CORONARY ANGIOPLASTY WITH STENT PLACEMENT  2009    2.75 x 28 mm Promus stent to the RCA  . FRACTURE SURGERY    . HERNIA REPAIR    . INSERTION OF MESH N/A 11/30/2016   Procedure: INSERTION OF MESH;  Surgeon: Judeth Horn, MD;  Location: [facility];  Service: General;  Laterality: N/A;  . LAPAROSCOPIC APPENDECTOMY N/A 09/16/2016   Procedure: APPENDECTOMY LAPAROSCOPIC;  Surgeon: Judeth Horn, MD;  Location: [facility];  Service: General;  Laterality: N/A;  . LAPAROSCOPIC INCISIONAL / UMBILICAL / [place]  11/30/2016   w/repair serosal tear  . ORIF ANKLE FRACTURE Left 11/27/2015   Procedure: OPEN REDUCTION INTERNAL FIXATION (ORIF) ANKLE FRACTURE;  Surgeon: Frederik Pear, MD;  Location: [facility];  Service: Orthopedics;  Laterality: Left;  [name] VENTRAL HERNIA REPAIR N/A 11/30/2016   Procedure: LAPAROSCOPIC VENTRAL HERNIA REPAIR AND  REPAIR OF SEROSAL TEAR;  Surgeon: Judeth Horn, MD;  Location: [facility];  Service: General;  Laterality: N/A;    FAMILY HISTORY Family History  Problem Relation Age of Onset  . Heart disease Mother   . Hypertension Father   . Prostate cancer Brother   . CAD Brother     SOCIAL HISTORY Social History   Tobacco Use  . Smoking status: Former Smoker    Packs/day: 2.50    Years: 36.00    Pack years: 90.00    Types: Cigarettes    Quit date: 08/22/2007    Years since quitting: 11.8  . Smokeless tobacco: Never Used  Substance Use Topics  . Alcohol use: Yes    Alcohol/week: 6.0 standard drinks    Types: 6 Cans of beer per week  . Drug use: Yes    Types: Amphetamines         OPHTHALMIC EXAM:  Base Eye Exam    Visual Acuity (Snellen - Linear)      Right Left   Dist [place] 20/25 +2 20/20 -1   Dist ph [place] 20/20        Tonometry (Tonopen, 8:42 AM)      Right  Left   Pressure 18 19       Pupils      Dark Light Shape React APD   Right 4 2 Round Brisk None   Left 4 2 Round Brisk None       Visual Fields (Counting fingers)      Left Right    Full Full  Extraocular Movement      Right Left    Full, Ortho Full, Ortho       Neuro/Psych    Oriented x3: Yes   Mood/Affect: Normal       Dilation    Both eyes: 1.0% Mydriacyl, 2.5% Phenylephrine @ 8:42 AM        Slit Lamp and Fundus Exam    Slit Lamp Exam      Right Left   Lids/Lashes Dermatochalasis - upper lid, mild Meibomian gland dysfunction Dermatochalasis - upper lid, mild Meibomian gland dysfunction   Conjunctiva/Sclera White and quiet White and quiet   Cornea Mild Arcus, mild Debris in tear film Mild Arcus, mild Debris in tear film, 1+ inferior Punctate epithelial erosions   Anterior Chamber deep, narrow temporal angle deep, narrow temporal angle   Iris Round and dilated, No NVI Round and dilated, No NVI   Lens 1-2+ Nuclear sclerosis, 2+ Cortical cataract 1-2+ Nuclear sclerosis, 2+ Cortical cataract   Vitreous Vitreous syneresis Vitreous syneresis       Fundus Exam      Right Left   Disc Pink and Sharp Pink and Sharp   C/D Ratio 0.3 0.1   Macula Flat, Blunted foveal reflex, mild Retinal pigment epithelial mottling, No heme or edema Flat, Blunted foveal reflex, mild Retinal pigment epithelial mottling, No heme or edema   Vessels Mild Vascular attenuation Mild Vascular attenuation   Periphery Attached, CHRPE at 0830, focal blot heme anterior to lesion -- resolved, VR tufts at 0300 just posterior to ora Attached, no heme; no RT/RD            IMAGING AND PROCEDURES  Imaging and Procedures for @TODAY @  OCT, Retina - OU - Both Eyes       Right Eye Quality was good. Central Foveal Thickness: 291. Progression has been stable. Findings include normal foveal contour, no IRF, no SRF.   Left Eye Quality was good. Central Foveal Thickness: 289. Progression has been  stable. Findings include normal foveal contour, no IRF, no SRF, vitreomacular adhesion .   Notes *Images captured and stored on drive  Diagnosis / Impression:  NFP, no IRF/SRF OU  Clinical management:  See below  Abbreviations: NFP - Normal foveal profile. CME - cystoid macular edema. PED - pigment epithelial detachment. IRF - intraretinal fluid. SRF - subretinal fluid. EZ - ellipsoid zone. ERM - epiretinal membrane. ORA - outer retinal atrophy. ORT - outer retinal tubulation. SRHM - subretinal hyper-reflective material        Repair Retinal Breaks, Laser - OD - Right Eye       LASER PROCEDURE NOTE  Procedure:  Barrier laser retinopexy using laser indirect ophthalmoscope, RIGHT eye   Diagnosis:   VR tufts w/ retinal defect, RIGHT eye                     3 o'clock almost to ora   Surgeon: Bernarda Caffey, MD, PhD  Anesthesia: Topical, subconjunctival block, retrobulbar block  Informed consent obtained, operative eye marked, and time out performed prior to initiation of laser.   Laser settings:  Lumenis [id number] laser indirect ophthalmoscope Power: 300 mW Duration: 70 msec  # spots: 478  Placement of laser: Laser was placed in three confluent rows around VR tufts w/ defect at 3 oclock almost to ora with additional rows anteriorly. The posterior border of laser was completed using slit lamp with [place] PRP 165 lens: 222 spot, 260 mW power, 30 ms duration.  Complications: None.  Patient tolerated the procedure well and received written and verbal post-procedure care information/education.                 ASSESSMENT/PLAN:    ICD-10-CM   1. Congenital hypertrophy of retinal pigment epithelium  Q14.1   2. Vitreoretinal tuft of right eye  Q14.1 Repair Retinal Breaks, Laser - OD - Right Eye  3. Right retinal defect  H33.301 Repair Retinal Breaks, Laser - OD - Right Eye  4. Retinal edema  H35.81 OCT, Retina - OU - Both Eyes  5. Combined forms of age-related cataract  of both eyes  H25.813   6. Essential hypertension  I10   7. Hypertensive retinopathy of both eyes  H35.033   8. Diabetes mellitus type 2 without retinopathy ([place])  E11.9     1. CHRPE OD  - focal, delineated pigmented lesion at 830 periphery consistent with congenital hypertrophy of RPE (CHRPE) -- benign lesion  - discussed findings, diagnosis, prognosis  - no intervention indicated  - Optos images obtained (03.02.21)  - recommend monitoring  - f/u 1 year  2. No retinal edema on exam or OCT  3. VR tufts w/ retinal defects OD  - located at 0300 posterior to ora   - discussed findings, diagnosis, prognosis, and potential treatment options including laser retinopexy  - recommend laser retinopexy OD today, 04.12.21  - pt wishes to proceed with laser  - start PF qid OD x7 days  - f/u 3-4 wks  4. Diabetes mellitus, type 2 without retinopathy  - The incidence, risk factors for progression, natural history and treatment options for diabetic retinopathy  were discussed with patient.    - The need for close monitoring of blood glucose, blood pressure, and serum lipids, avoiding cigarette or any type of tobacco, and the need for long term follow up was also discussed with patient.  - f/u in 1 year, sooner prn  5,6. Hypertensive retinopathy OU  - discussed importance of tight BP control  - monitor  7. Mixed form age related cataracts OU  - The symptoms of cataract, surgical options, and treatments and risks were discussed with patient.  - discussed diagnosis and progression  - not yet visually significant  - monitor for now    Ophthalmic Meds Ordered this visit:  Meds ordered this encounter  Medications  . prednisoLONE acetate (PRED FORTE) 1 % ophthalmic suspension    Sig: Place 1 drop into the right eye 4 (four) times daily for 7 days.    Dispense:  10 mL    Refill:  0       Return for 3-4 wks, POV s/p laser retinopexy OD.  There are no Patient Instructions on file for this  visit.   Explained the diagnoses, plan, and follow up with the patient and they expressed understanding.  Patient expressed understanding of the importance of proper follow up care.   This document serves as a record of services personally performed by Gardiner Sleeper, MD, PhD. It was created on their behalf by Leeann Must, [place], a certified ophthalmic assistant. The creation of this record is the provider's dictation and/or activities during the visit.    Electronically signed by: Leeann Must, COA @TODAY @ 11:06 AM   This document serves as a record of services personally performed by Gardiner Sleeper, MD, PhD. It was created on their behalf by Ernest Mallick, OA, an ophthalmic assistant. The creation of this record is the provider's dictation and/or activities  during the visit.    Electronically signed by: Ernest Mallick, OA 04.12.2021 11:06 AM   Gardiner Sleeper, M.D., Ph.D. Diseases & Surgery of the Retina and Vitreous [facility]  I have reviewed the above documentation for accuracy and completeness, and I agree with the above. Gardiner Sleeper, M.D., Ph.D. 07/03/19 11:06 AM   Abbreviations: M myopia (nearsighted); A astigmatism; H hyperopia (farsighted); P presbyopia; Mrx spectacle prescription;  CTL contact lenses; OD right eye; OS left eye; OU both eyes  XT exotropia; ET esotropia; PEK punctate epithelial keratitis; PEE punctate epithelial erosions; DES dry eye syndrome; MGD meibomian gland dysfunction; ATs artificial tears; PFAT's preservative free artificial tears; [place] nuclear sclerotic cataract; PSC posterior subcapsular cataract; ERM epi-retinal membrane; PVD posterior vitreous detachment; RD retinal detachment; DM diabetes mellitus; DR diabetic retinopathy; NPDR non-proliferative diabetic retinopathy; PDR proliferative diabetic retinopathy; CSME clinically significant macular edema; DME diabetic macular edema; dbh dot blot hemorrhages; CWS cotton wool spot; POAG  primary open angle glaucoma; C/D cup-to-disc ratio; HVF humphrey visual field; GVF goldmann visual field; OCT optical coherence tomography; IOP intraocular pressure; BRVO Branch retinal vein occlusion; CRVO central retinal vein occlusion; CRAO central retinal artery occlusion; BRAO branch retinal artery occlusion; RT retinal tear; SB scleral buckle; PPV pars plana vitrectomy; VH Vitreous hemorrhage; PRP panretinal laser photocoagulation; IVK intravitreal kenalog; VMT vitreomacular traction; MH Macular hole;  NVD neovascularization of the disc; NVE neovascularization elsewhere; AREDS age related eye disease study; ARMD age related macular degeneration; POAG primary open angle glaucoma; EBMD epithelial/anterior basement membrane dystrophy; ACIOL anterior chamber intraocular lens; IOL intraocular lens; PCIOL posterior chamber intraocular lens; Phaco/IOL phacoemulsification with intraocular lens placement; [place] photorefractive keratectomy; LASIK laser assisted in situ keratomileusis; HTN hypertension; DM diabetes mellitus; COPD chronic obstructive pulmonary disease

## 2019-07-03 ENCOUNTER — Other Ambulatory Visit: Payer: Self-pay

## 2019-07-03 ENCOUNTER — Ambulatory Visit (INDEPENDENT_AMBULATORY_CARE_PROVIDER_SITE_OTHER): Payer: BC Managed Care – PPO | Admitting: Ophthalmology

## 2019-07-03 ENCOUNTER — Encounter (INDEPENDENT_AMBULATORY_CARE_PROVIDER_SITE_OTHER): Payer: Self-pay | Admitting: Ophthalmology

## 2019-07-03 DIAGNOSIS — H25813 Combined forms of age-related cataract, bilateral: Secondary | ICD-10-CM

## 2019-07-03 DIAGNOSIS — H35033 Hypertensive retinopathy, bilateral: Secondary | ICD-10-CM

## 2019-07-03 DIAGNOSIS — H3581 Retinal edema: Secondary | ICD-10-CM | POA: Diagnosis not present

## 2019-07-03 DIAGNOSIS — Q141 Congenital malformation of retina: Secondary | ICD-10-CM

## 2019-07-03 DIAGNOSIS — I10 Essential (primary) hypertension: Secondary | ICD-10-CM

## 2019-07-03 DIAGNOSIS — H33301 Unspecified retinal break, right eye: Secondary | ICD-10-CM

## 2019-07-03 DIAGNOSIS — E119 Type 2 diabetes mellitus without complications: Secondary | ICD-10-CM

## 2019-07-03 MED ORDER — PREDNISOLONE ACETATE 1 % OP SUSP: 1.0000 [drp] | Freq: Four times a day (QID) | OPHTHALMIC | 0 refills | Status: AC

## 2019-07-06 ENCOUNTER — Other Ambulatory Visit: Payer: Self-pay | Admitting: Cardiovascular Disease

## 2019-07-13 ENCOUNTER — Ambulatory Visit: Payer: BC Managed Care – PPO | Admitting: Podiatry

## 2019-07-27 ENCOUNTER — Other Ambulatory Visit: Payer: Self-pay

## 2019-07-27 MED ORDER — NITROGLYCERIN 0.4 MG SL SUBL: 0.4000 mg | SUBLINGUAL_TABLET | SUBLINGUAL | 1 refills | Status: DC | PRN

## 2019-07-27 NOTE — Progress Notes (Signed)
[facility] Note  07/31/2019     CHIEF COMPLAINT Patient presents for Retina Follow Up   HISTORY OF PRESENT ILLNESS: Chris [name] is a 64 y.o. male who presents to the clinic today for:   HPI    Retina Follow Up    Patient presents with  Other.  In right eye.  This started weeks ago.  Severity is moderate.  Duration of weeks.  Since onset it is stable.  I, the attending physician,  performed the HPI with the patient and updated documentation appropriately.          Comments    Pt states vision is the same OU.  Patient denies eye pain or discomfort and denies any new or worsening floaters or fol OU.       Last edited by Bernarda Caffey, MD on 07/31/2019 12:55 PM. (History)    pt states he had no problems after the laser at last visit, he used the PF for 7 days as directed   Referring physician: Shirleen Schirmer, PA-C  [address] [place],  [place] [address]  HISTORICAL INFORMATION:   Selected notes from the [place] Referral by Shirleen Schirmer, PA for eval of ARMD OU/ret hem inferior to choroidal nevus OD. DM, HTN NS OU [facility] [place] OD: 20/30-            OS: 20/25 MRx OD: -0.50+0.75x175 20/20         OS: -0.50+0.50x068 20/20         ADD: +2.25 IOPs 18, 17   CURRENT MEDICATIONS: No current outpatient medications on file. (Ophthalmic Drugs)   No current facility-administered medications for this visit. (Ophthalmic Drugs)   Current Outpatient Medications (Other)  Medication Sig  . allopurinol (ZYLOPRIM) 300 MG tablet Take 300 mg by mouth daily.  [name] aspirin 81 MG tablet Take 81 mg by mouth daily.   [name] atorvastatin (LIPITOR) 40 MG tablet Take 1 tablet (40 mg total) by mouth daily.  . chlorthalidone (HYGROTON) 25 MG tablet Take 1 tablet (25 mg total) by mouth daily.  . clopidogrel (PLAVIX) 75 MG tablet TAKE 1 TABLET BY MOUTH EVERY DAY  . docusate sodium (COLACE) 50 MG capsule Take 1 capsule (50 mg total) by mouth 2 (two) times  daily.  . fluticasone (FLONASE) 50 MCG/ACT nasal spray Place 1 spray into both nostrils 2 (two) times daily.  [name] gabapentin (NEURONTIN) 300 MG capsule Take 1-2 capsules (300-600 mg total) by mouth at bedtime.  . metFORMIN (GLUCOPHAGE) 500 MG tablet Take 1 tablet (500 mg total) by mouth daily with breakfast.  . metoprolol succinate (TOPROL-XL) 25 MG 24 hr tablet Take 0.5 tablets (12.5 mg total) by mouth 2 (two) times daily.  . multivitamin-iron-minerals-folic acid (CENTRUM) chewable tablet Chew 1 tablet by mouth daily.  . nitroGLYCERIN (NITROSTAT) 0.4 MG SL tablet Place 1 tablet (0.4 mg total) under the tongue every 5 (five) minutes as needed for chest pain.  . Omega-3 Fatty Acids (FISH OIL) 1200 MG CAPS Take 2 capsules by mouth 2 (two) times daily.  . pantoprazole (PROTONIX) 40 MG tablet Take one po q day  . sildenafil (VIAGRA) 100 MG tablet Take 0.5-1 tablets (50-100 mg total) by mouth daily as needed for erectile dysfunction.  . sodium phosphate (FLEET) 7-19 GM/118ML ENEM Place 133 mLs (1 enema total) rectally daily as needed for severe constipation.   No current facility-administered medications for this visit. (Other)  REVIEW OF SYSTEMS: ROS    Positive for: Cardiovascular, Eyes, Heme/Lymph   Negative for: Constitutional, Gastrointestinal, Neurological, Skin, Genitourinary, Musculoskeletal, HENT, Endocrine, Respiratory, Psychiatric, Allergic/Imm   Last edited by Doneen Poisson on 07/31/2019  9:07 AM. (History)       ALLERGIES Allergies  Allergen Reactions  . Ace Inhibitors Rash  . Ropinirole Hcl Rash    PAST MEDICAL HISTORY Past Medical History:  Diagnosis Date  . Acute MI, inferior wall, initial episode of care [facility]) 2009  . Anemia   . Arthritis   . Coronary atherosclerosis of unspecified type of vessel, native or graft   . GERD (gastroesophageal reflux disease)   . High cholesterol   . Hypertension   . Seasonal allergies   . Sleep apnea    "I've never worn a  mask" (09/17/2016)   Past Surgical History:  Procedure Laterality Date  . APPENDECTOMY    . CHOLECYSTECTOMY OPEN  ~ 2008   "it exploded"  . CORONARY ANGIOPLASTY WITH STENT PLACEMENT  2009    2.75 x 28 mm Promus stent to the RCA  . FRACTURE SURGERY    . HERNIA REPAIR    . INSERTION OF MESH N/A 11/30/2016   Procedure: INSERTION OF MESH;  Surgeon: Judeth Horn, MD;  Location: [facility];  Service: General;  Laterality: N/A;  . LAPAROSCOPIC APPENDECTOMY N/A 09/16/2016   Procedure: APPENDECTOMY LAPAROSCOPIC;  Surgeon: Judeth Horn, MD;  Location: [facility];  Service: General;  Laterality: N/A;  . LAPAROSCOPIC INCISIONAL / UMBILICAL / [place]  11/30/2016   w/repair serosal tear  . ORIF ANKLE FRACTURE Left 11/27/2015   Procedure: OPEN REDUCTION INTERNAL FIXATION (ORIF) ANKLE FRACTURE;  Surgeon: Frederik Pear, MD;  Location: [facility];  Service: Orthopedics;  Laterality: Left;  [name] VENTRAL HERNIA REPAIR N/A 11/30/2016   Procedure: LAPAROSCOPIC VENTRAL HERNIA REPAIR AND  REPAIR OF SEROSAL TEAR;  Surgeon: Judeth Horn, MD;  Location: [facility];  Service: General;  Laterality: N/A;    FAMILY HISTORY Family History  Problem Relation Age of Onset  . Heart disease Mother   . Hypertension Father   . Prostate cancer Brother   . CAD Brother     SOCIAL HISTORY Social History   Tobacco Use  . Smoking status: Former Smoker    Packs/day: 2.50    Years: 36.00    Pack years: 90.00    Types: Cigarettes    Quit date: 08/22/2007    Years since quitting: 11.9  . Smokeless tobacco: Never Used  Substance Use Topics  . Alcohol use: Yes    Alcohol/week: 6.0 standard drinks    Types: 6 Cans of beer per week  . Drug use: Yes    Types: Amphetamines         OPHTHALMIC EXAM:  Base Eye Exam    Visual Acuity (Snellen - Linear)      Right Left   Dist [place] 20/25 -2 20/20 -1   Dist ph [place] 20/20 -2        Tonometry (Tonopen, 9:11 AM)      Right Left   Pressure 10 14       Pupils       Dark Light Shape React APD   Right 3 2 Round Brisk 0   Left 3 2 Round Brisk 0       Visual Fields      Left Right    Full Full       Extraocular Movement  Right Left    Full Full       Neuro/Psych    Oriented x3: Yes   Mood/Affect: Normal       Dilation    Both eyes: 1.0% Mydriacyl, 2.5% Phenylephrine @ 9:11 AM        Slit Lamp and Fundus Exam    Slit Lamp Exam      Right Left   Lids/Lashes Dermatochalasis - upper lid, mild Meibomian gland dysfunction Dermatochalasis - upper lid, mild Meibomian gland dysfunction   Conjunctiva/Sclera White and quiet White and quiet   Cornea Mild Arcus, mild Debris in tear film Mild Arcus, mild Debris in tear film, 1+ inferior Punctate epithelial erosions   Anterior Chamber deep, narrow temporal angle deep, narrow temporal angle   Iris Round and dilated, No NVI Round and dilated, No NVI   Lens 1-2+ Nuclear sclerosis, 2+ Cortical cataract 1-2+ Nuclear sclerosis, 2+ Cortical cataract   Vitreous Vitreous syneresis Vitreous syneresis       Fundus Exam      Right Left   Disc Pink and Sharp Pink and Sharp   C/D Ratio 0.3 0.1   Macula Flat, Blunted foveal reflex, mild Retinal pigment epithelial mottling, No heme or edema Flat, Blunted foveal reflex, mild Retinal pigment epithelial mottling, No heme or edema   Vessels Mild Vascular attenuation Mild Vascular attenuation   Periphery Attached, CHRPE at 0830, focal blot heme anterior to lesion -- resolved, VR tufts at 0300 just posterior to ora -- good laser surrounding, No RT/RD Attached, no heme; no RT/RD          Refraction    Wearing Rx      Sphere Cylinder   Right +1.25 Sphere   Left +1.25 Sphere   Type: OTC          IMAGING AND PROCEDURES  Imaging and Procedures for _0 @  OCT, Retina - OU - Both Eyes       Right Eye Quality was good. Central Foveal Thickness: 287. Progression has been stable. Findings include normal foveal contour, no IRF, no SRF, vitreomacular  adhesion .   Left Eye Quality was good. Central Foveal Thickness: 284. Progression has been stable. Findings include normal foveal contour, no IRF, no SRF, vitreomacular adhesion .   Notes *Images captured and stored on drive  Diagnosis / Impression:  NFP, no IRF/SRF OU  Clinical management:  See below  Abbreviations: NFP - Normal foveal profile. CME - cystoid macular edema. PED - pigment epithelial detachment. IRF - intraretinal fluid. SRF - subretinal fluid. EZ - ellipsoid zone. ERM - epiretinal membrane. ORA - outer retinal atrophy. ORT - outer retinal tubulation. SRHM - subretinal hyper-reflective material                 ASSESSMENT/PLAN:    ICD-10-CM   1. Congenital hypertrophy of retinal pigment epithelium  Q14.1   2. Right retinal defect  H33.301   3. Vitreoretinal tuft of right eye  Q14.1   4. Retinal edema  H35.81 OCT, Retina - OU - Both Eyes  5. Combined forms of age-related cataract of both eyes  H25.813   6. Essential hypertension  I10   7. Diabetes mellitus type 2 without retinopathy ([place])  E11.9   8. Hypertensive retinopathy of both eyes  H35.033     1. CHRPE OD  - focal, delineated pigmented lesion at 830 periphery consistent with congenital hypertrophy of RPE (CHRPE) -- benign lesion  - discussed findings, diagnosis, prognosis  - no  intervention indicated  - Optos images obtained (03.02.21)  - recommend monitoring  - f/u 1 year  2. No retinal edema on exam or OCT  3. VR tufts w/ retinal defects OD  - located at 0300 posterior to ora   - s/p laser retinopexy OD (04.12.21) -- good laser surrounding  - completed PF qid OD x7 days  - f/u 3 months  4. Diabetes mellitus, type 2 without retinopathy  - The incidence, risk factors for progression, natural history and treatment options for diabetic retinopathy  were discussed with patient.    - The need for close monitoring of blood glucose, blood pressure, and serum lipids, avoiding cigarette or any type  of tobacco, and the need for long term follow up was also discussed with patient.  - f/u in 1 year, sooner prn  5,6. Hypertensive retinopathy OU  - discussed importance of tight BP control  - monitor  7. Mixed form age related cataracts OU  - The symptoms of cataract, surgical options, and treatments and risks were discussed with patient.  - discussed diagnosis and progression  - not yet visually significant  - monitor for now    Ophthalmic Meds Ordered this visit:  No orders of the defined types were placed in this encounter.      Return in about 3 months (around 10/31/2019) for f/u VR tuft OD , DFE, OCT.  There are no Patient Instructions on file for this visit.   Explained the diagnoses, plan, and follow up with the patient and they expressed understanding.  Patient expressed understanding of the importance of proper follow up care.   This document serves as a record of services personally performed by Gardiner Sleeper, MD, PhD. It was created on their behalf by Leeann Must, [place], a certified ophthalmic assistant. The creation of this record is the provider's dictation and/or activities during the visit.    Electronically signed by: Leeann Must, COA _0 @ 12:56 PM   This document serves as a record of services personally performed by Gardiner Sleeper, MD, PhD. It was created on their behalf by Ernest Mallick, OA, an ophthalmic assistant. The creation of this record is the provider's dictation and/or activities during the visit.    Electronically signed by: Ernest Mallick, OA 05.10.2021 12:56 PM   Gardiner Sleeper, M.D., Ph.D. Diseases & Surgery of the Retina and Vitreous [facility]  I have reviewed the above documentation for accuracy and completeness, and I agree with the above. Gardiner Sleeper, M.D., Ph.D. 07/31/19 12:56 PM   Abbreviations: M myopia (nearsighted); A astigmatism; H hyperopia (farsighted); P presbyopia; Mrx spectacle  prescription;  CTL contact lenses; OD right eye; OS left eye; OU both eyes  XT exotropia; ET esotropia; PEK punctate epithelial keratitis; PEE punctate epithelial erosions; DES dry eye syndrome; MGD meibomian gland dysfunction; ATs artificial tears; PFAT's preservative free artificial tears; [place] nuclear sclerotic cataract; PSC posterior subcapsular cataract; ERM epi-retinal membrane; PVD posterior vitreous detachment; RD retinal detachment; DM diabetes mellitus; DR diabetic retinopathy; NPDR non-proliferative diabetic retinopathy; PDR proliferative diabetic retinopathy; CSME clinically significant macular edema; DME diabetic macular edema; dbh dot blot hemorrhages; CWS cotton wool spot; POAG primary open angle glaucoma; C/D cup-to-disc ratio; HVF humphrey visual field; GVF goldmann visual field; OCT optical coherence tomography; IOP intraocular pressure; BRVO Branch retinal vein occlusion; CRVO central retinal vein occlusion; CRAO central retinal artery occlusion; BRAO branch retinal artery occlusion; RT retinal tear; SB scleral buckle; PPV pars plana vitrectomy;  VH Vitreous hemorrhage; PRP panretinal laser photocoagulation; IVK intravitreal kenalog; VMT vitreomacular traction; MH Macular hole;  NVD neovascularization of the disc; NVE neovascularization elsewhere; AREDS age related eye disease study; ARMD age related macular degeneration; POAG primary open angle glaucoma; EBMD epithelial/anterior basement membrane dystrophy; ACIOL anterior chamber intraocular lens; IOL intraocular lens; PCIOL posterior chamber intraocular lens; Phaco/IOL phacoemulsification with intraocular lens placement; [place] photorefractive keratectomy; LASIK laser assisted in situ keratomileusis; HTN hypertension; DM diabetes mellitus; COPD chronic obstructive pulmonary disease

## 2019-07-31 ENCOUNTER — Other Ambulatory Visit: Payer: Self-pay

## 2019-07-31 ENCOUNTER — Ambulatory Visit (INDEPENDENT_AMBULATORY_CARE_PROVIDER_SITE_OTHER): Payer: BC Managed Care – PPO | Admitting: Ophthalmology

## 2019-07-31 ENCOUNTER — Encounter (INDEPENDENT_AMBULATORY_CARE_PROVIDER_SITE_OTHER): Payer: Self-pay | Admitting: Ophthalmology

## 2019-07-31 DIAGNOSIS — H3581 Retinal edema: Secondary | ICD-10-CM | POA: Diagnosis not present

## 2019-07-31 DIAGNOSIS — Q141 Congenital malformation of retina: Secondary | ICD-10-CM

## 2019-07-31 DIAGNOSIS — H25813 Combined forms of age-related cataract, bilateral: Secondary | ICD-10-CM

## 2019-07-31 DIAGNOSIS — E119 Type 2 diabetes mellitus without complications: Secondary | ICD-10-CM

## 2019-07-31 DIAGNOSIS — H33301 Unspecified retinal break, right eye: Secondary | ICD-10-CM

## 2019-07-31 DIAGNOSIS — I10 Essential (primary) hypertension: Secondary | ICD-10-CM

## 2019-07-31 DIAGNOSIS — H35033 Hypertensive retinopathy, bilateral: Secondary | ICD-10-CM

## 2019-08-02 ENCOUNTER — Other Ambulatory Visit: Payer: Self-pay

## 2019-08-02 ENCOUNTER — Ambulatory Visit (INDEPENDENT_AMBULATORY_CARE_PROVIDER_SITE_OTHER): Payer: BC Managed Care – PPO | Admitting: Cardiovascular Disease

## 2019-08-02 ENCOUNTER — Encounter: Payer: Self-pay | Admitting: Cardiovascular Disease

## 2019-08-02 VITALS — BP 122/78 | HR 66 | Ht 69.0 in | Wt 217.0 lb

## 2019-08-02 DIAGNOSIS — I2510 Atherosclerotic heart disease of native coronary artery without angina pectoris: Secondary | ICD-10-CM

## 2019-08-02 DIAGNOSIS — I10 Essential (primary) hypertension: Secondary | ICD-10-CM | POA: Diagnosis not present

## 2019-08-02 DIAGNOSIS — E782 Mixed hyperlipidemia: Secondary | ICD-10-CM

## 2019-08-02 LAB — BASIC METABOLIC PANEL
BUN/Creatinine Ratio: 15 (ref 10–24)
BUN: 14 mg/dL (ref 8–27)
CO2: 27 mmol/L (ref 20–29)
Calcium: 9.8 mg/dL (ref 8.6–10.2)
Chloride: 97 mmol/L (ref 96–106)
Creatinine, Ser: 0.95 mg/dL (ref 0.76–1.27)
GFR calc Af Amer: 98 mL/min/{1.73_m2} (ref >59)
GFR calc non Af Amer: 85 mL/min/{1.73_m2} (ref >59)
Glucose: 121 mg/dL — ABNORMAL HIGH (ref 65–99)
Potassium: 3.7 mmol/L (ref 3.5–5.2)
Sodium: 137 mmol/L (ref 134–144)

## 2019-08-02 LAB — LIPID PANEL
Chol/HDL Ratio: 3.9 ratio (ref 0.0–5.0)
Cholesterol, Total: 131 mg/dL (ref 100–199)
HDL: 34 mg/dL — ABNORMAL LOW (ref >39)
LDL Chol Calc (NIH): 61 mg/dL (ref 0–99)
Triglycerides: 221 mg/dL — ABNORMAL HIGH (ref 0–149)
VLDL Cholesterol Cal: 36 mg/dL (ref 5–40)

## 2019-08-02 LAB — HEPATIC FUNCTION PANEL
ALT: 38 IU/L (ref 0–44)
AST: 33 IU/L (ref 0–40)
Albumin: 4.4 g/dL (ref 3.8–4.8)
Alkaline Phosphatase: 89 IU/L (ref 39–117)
Bilirubin Total: 0.7 mg/dL (ref 0.0–1.2)
Bilirubin, Direct: 0.19 mg/dL (ref 0.00–0.40)
Total Protein: 7.1 g/dL (ref 6.0–8.5)

## 2019-08-02 NOTE — Patient Instructions (Signed)
Medication Instructions:  Your physician recommends that you continue on your current medications as directed. Please refer to the Current Medication list given to you today.  *If you need a refill on your cardiac medications before your next appointment, please call your pharmacy*   Lab Work: TODAY - cholesterol, liver panel, basic metabolic panel (kidney function/electrolytes)  If you have labs (blood work) drawn today and your tests are completely normal, you will receive your results only by: . MyChart Message (if you have MyChart) OR . A paper copy in the mail If you have any lab test that is abnormal or we need to change your treatment, we will call you to review the results.   Testing/Procedures: None Ordered   Follow-Up: At [facility], you and your health needs are our priority.  As part of our continuing mission to provide you with exceptional heart care, we have created designated Provider Care Teams.  These Care Teams include your primary Cardiologist (physician) and Advanced Practice Providers (APPs -  Physician Assistants and Nurse Practitioners) who all work together to provide you with the care you need, when you need it.     Your next appointment:   1 year(s)  The format for your next appointment:   In Person  Provider:   You may see Philip Nahser, MD or one of the following Advanced Practice Providers on your designated Care Team:    Scott Weaver, PA-C  Vin Bhagat, PA-C     

## 2019-08-02 NOTE — Progress Notes (Signed)
Chris [name]  Date of Birth  1956/01/01   Problems: 1. Coronary artery disease- He presented in 2009 with some episodes of chest pain and was found to have an inferior wall myocardial infarction. He underwent successful PTCA and stenting of his mid  right coronary artery using a 2.75 x 28 mm Promus stent. The stent was post dilated using a 3.0 noncompliant balloon.  We then positioned a 3.0 x 15 mm Promus stent in the proximal right coronary artery.  The stent was post dilated using a 3.25 mm noncompliant balloon. 2. Hyperlipidemia 3. Sleep apnea 4. Hypertension     Chris [name] is a 64 year old gentleman. He has a history of coronary artery disease. He presented in 2009 with some episodes of chest pain and was found to have an inferior wall myocardial infarction. He underwent successful PTCA and stenting of his mid  right coronary artery using a 2.75 x 28 mm Promus stent. The stent was post dilated using a 3.0 noncompliant balloon.  We then positioned a 3.0 x 15 mm Promus stent in the proximal right coronary artery.  The stent was post dilated using a 3.25 mm noncompliant balloon.  Pt is doing well.  No angina or dyspnea.    He has not been sleeping well.  He also did not take his meds this am.  April 13, 2012: Chris [name] is doing well from a cardiac standpoint.  Exercising on the treadmill every other day. Complains of constipation.  October 17, 2012:  Thank started having some episodes of chest pain last week.  He had been doing lots of physical activity - played 36 holes of golf a day  for 2 days straight.  He also has been busy doing phyisical work.     He had a stress Myoview study. He walked for 11 minutes on a standard Bruce protocol triple test. A Myoview study revealed an old inferior wall myocardial infarction but was otherwise unremarkable.   June 14, 2013:  He is doing well .[name] Has some back pain and "crick" in his neck.  No CP .  Staying active, walking in the evenings 2.5 miles    Sept. 29, 2015:  Chris [name] is doing ok.  He is not working at [employer].   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  Doing well from a cardiac standpoint.  Still walking quite a bit.    Aug. 29, 2016:  Doing well. Owns his own business.  [name]  No CP , no dyspnea.   Jan. 17, 2017: No angina  Is staying busy Working hard.   Oct. 9, 2017:   Chris [name] is seen today for follow up of his CAD Is healing up from a left ankle injury.  Walking on crutches .   Wears him out.  Has not had any chest pain  Or dyspnea  Aug. 14, 2018: Chris [name] is seen for follow up for his CAD Had emergent Appy surgery. Was found to have a ventral hernia at that time. Now needs to have hernia repair.  He had no complications with his emergent appendectomy. He's been doing well. No episodes of chest pain or short of breath.  Feb. 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   No CP or dyspnea Works out on th eElliptical  Primary MD added Chlorthaladone .   2 years ago , he had broke his left ankle and developed a DVT / pulmonary  embolus .    Sept.  11, 2019  Having some pain in right shoulder.  Slept on it wrong .   May 16, 2018: Seen today for follow-up visit regarding his coronary artery disease.  He also has a history of hyperlipidemia and diabetes mellitus. Last lipid levels were from December 01, 2017: Total cholesterol is 130.  LDL is 54.  His triglyceride level is 203.  Having some right shoulder issues.   Getting a MRI today  May need to have shoulder surgery  He is at low risk for CV complications during shoulder surgery.  Sept. 15, 2020  Chris [name] is seen today  Has a friend who is selling a [id number] tractor   Stays busy. No CP .  Walks 4 miles a day .   May , 12, 2021  Doing well.   No CP  Did lots of work yesterday - removed his sidewalk yesterday Walks 4 miles twice a day  Has not had covid vaccines yet .     Current Outpatient Medications on File Prior to Visit  Medication Sig Dispense Refill  . allopurinol (ZYLOPRIM) 300 MG tablet Take 300 mg by mouth daily.    [name] aspirin 81 MG tablet Take 81 mg by mouth daily.     [name] atorvastatin (LIPITOR) 40 MG tablet Take 1 tablet (40 mg total) by mouth daily. 90 tablet 3  . chlorthalidone (HYGROTON) 25 MG tablet Take 1 tablet (25 mg total) by mouth daily. 90 tablet 1  . clopidogrel (PLAVIX) 75 MG tablet TAKE 1 TABLET BY MOUTH EVERY DAY 90 tablet 1  . docusate sodium (COLACE) 50 MG capsule Take 1 capsule (50 mg total) by mouth 2 (two) times daily. 60 capsule 5  . fluticasone (FLONASE) 50 MCG/ACT nasal spray Place 1 spray into both nostrils 2 (two) times daily. 16 g 6  . gabapentin (NEURONTIN) 300 MG capsule Take 1-2 capsules (300-600 mg total) by mouth at bedtime. 180 capsule 1  . metFORMIN (GLUCOPHAGE) 500 MG tablet Take 1 tablet (500 mg total) by mouth daily with breakfast. 90 tablet 1  . metoprolol succinate (TOPROL-XL) 25 MG 24 hr tablet Take 0.5 tablets (12.5 mg total) by mouth 2 (two) times daily. 90 tablet 3  . multivitamin-iron-minerals-folic acid (CENTRUM) chewable tablet Chew 1 tablet by mouth daily.    . nitroGLYCERIN (NITROSTAT) 0.4 MG SL tablet Place 1 tablet (0.4 mg total) under the tongue every 5 (five) minutes as needed for chest pain. 100 tablet 1  . Omega-3 Fatty Acids (FISH OIL) 1200 MG CAPS Take 2 capsules by mouth 2 (two) times daily.    . pantoprazole (PROTONIX) 40 MG tablet Take one po q day 90 tablet 3  . sildenafil (VIAGRA) 100 MG tablet Take 0.5-1 tablets (50-100 mg total) by mouth daily as needed for erectile dysfunction. 30 tablet 1  . sodium phosphate (FLEET) 7-19 GM/118ML ENEM Place 133 mLs (1 enema total) rectally daily as needed for severe constipation. 5 enema 5   No current facility-administered medications on file prior to visit.    Allergies  Allergen Reactions  . Ace Inhibitors Rash  . Ropinirole Hcl Rash    Past  Medical History:  Diagnosis Date  . Acute MI, inferior wall, initial episode of care [facility]) 2009  . Anemia   . Arthritis   . Coronary atherosclerosis of unspecified type of vessel, native or graft   . GERD (gastroesophageal reflux disease)   . High cholesterol   . Hypertension   .  Seasonal allergies   . Sleep apnea    "I've never worn a mask" (09/17/2016)    Past Surgical History:  Procedure Laterality Date  . APPENDECTOMY    . CHOLECYSTECTOMY OPEN  ~ 2008   "it exploded"  . CORONARY ANGIOPLASTY WITH STENT PLACEMENT  2009    2.75 x 28 mm Promus stent to the RCA  . FRACTURE SURGERY    . HERNIA REPAIR    . INSERTION OF MESH N/A 11/30/2016   Procedure: INSERTION OF MESH;  Surgeon: Judeth Horn, MD;  Location: [facility];  Service: General;  Laterality: N/A;  . LAPAROSCOPIC APPENDECTOMY N/A 09/16/2016   Procedure: APPENDECTOMY LAPAROSCOPIC;  Surgeon: Judeth Horn, MD;  Location: [facility];  Service: General;  Laterality: N/A;  . LAPAROSCOPIC INCISIONAL / UMBILICAL / [place]  11/30/2016   w/repair serosal tear  . ORIF ANKLE FRACTURE Left 11/27/2015   Procedure: OPEN REDUCTION INTERNAL FIXATION (ORIF) ANKLE FRACTURE;  Surgeon: Frederik Pear, MD;  Location: [facility];  Service: Orthopedics;  Laterality: Left;  [name] VENTRAL HERNIA REPAIR N/A 11/30/2016   Procedure: LAPAROSCOPIC VENTRAL HERNIA REPAIR AND  REPAIR OF SEROSAL TEAR;  Surgeon: Judeth Horn, MD;  Location: [facility];  Service: General;  Laterality: N/A;    Social History   Tobacco Use  Smoking Status Former Smoker  . Packs/day: 2.50  . Years: 36.00  . Pack years: 90.00  . Types: Cigarettes  . Quit date: 08/22/2007  . Years since quitting: 11.9  Smokeless Tobacco Never Used    Social History   Substance and Sexual Activity  Alcohol Use Yes  . Alcohol/week: 6.0 standard drinks  . Types: 6 Cans of beer per week    Family History  Problem Relation Age of Onset  . Heart disease Mother   . Hypertension Father    . Prostate cancer Brother   . CAD Brother     Reviw of Systems:  Reviewed in the HPI.  All other systems are negative.  Physical Exam: Blood pressure 122/78, pulse 66, height 5\' 9"  (1.753 m), weight 217 lb (98.4 kg), SpO2 98 %.  GEN:  Well nourished, well developed in no acute distress HEENT: Normal NECK: No JVD; No carotid bruits LYMPHATICS: No lymphadenopathy CARDIAC: RRR  RESPIRATORY:  Clear to auscultation without rales, wheezing or rhonchi  ABDOMEN: Soft, non-tender, non-distended MUSCULOSKELETAL:  No edema; No deformity  SKIN: Warm and dry NEUROLOGIC:  Alert and oriented x 3  EKG:   May, 12,  2021:  NSR previous Inf. MI    Assessment / Plan:   .1. Coronary artery disease-   No angina .   Doing well .  He walks 4 miles every day  And sometimes twice a day without any chest pain.  Continue current medications.  2. Hyperlipidemia-   Will check labs today    3. Sleep apnea  4. Hypertension -   BP is well controlled.   Cont same meds.   5.  ED :         follow up in a year.     Mertie Moores, MD  08/02/2019 8:13 AM    [address],  [place], [place]  [address] Pager [phone] Phone: [phone]; Fax: [phone]

## 2019-08-23 DIAGNOSIS — R239 Unspecified skin changes: Secondary | ICD-10-CM | POA: Diagnosis not present

## 2019-08-23 DIAGNOSIS — L559 Sunburn, unspecified: Secondary | ICD-10-CM | POA: Diagnosis not present

## 2019-08-23 DIAGNOSIS — L298 Other pruritus: Secondary | ICD-10-CM | POA: Diagnosis not present

## 2019-10-09 ENCOUNTER — Telehealth (INDEPENDENT_AMBULATORY_CARE_PROVIDER_SITE_OTHER): Payer: BC Managed Care – PPO | Admitting: Family Medicine

## 2019-10-09 ENCOUNTER — Encounter: Payer: Self-pay | Admitting: Family Medicine

## 2019-10-09 ENCOUNTER — Other Ambulatory Visit: Payer: Self-pay

## 2019-10-09 DIAGNOSIS — G2581 Restless legs syndrome: Secondary | ICD-10-CM | POA: Diagnosis not present

## 2019-10-09 DIAGNOSIS — E782 Mixed hyperlipidemia: Secondary | ICD-10-CM

## 2019-10-09 DIAGNOSIS — Z8739 Personal history of other diseases of the musculoskeletal system and connective tissue: Secondary | ICD-10-CM

## 2019-10-09 DIAGNOSIS — E119 Type 2 diabetes mellitus without complications: Secondary | ICD-10-CM

## 2019-10-09 DIAGNOSIS — I10 Essential (primary) hypertension: Secondary | ICD-10-CM | POA: Diagnosis not present

## 2019-10-09 NOTE — Progress Notes (Signed)
Virtual Visit Note  I connected with patient on 10/09/19 at 1028pm by video doximity and verified that I am speaking with the correct person using two identifiers. Chris [name] is currently located at home and patient is currently with them during visit. The provider, Myles Lipps, MD is located in their home at time of visit.  I discussed the limitations, risks, security and privacy concerns of performing an evaluation and management service by telephone and the availability of in person appointments. I also discussed with the patient that there may be a patient responsible charge related to this service. The patient expressed understanding and agreed to proceed.   I provided 14 minutes of non-face-to-face time during this encounter.  Chief Complaint  Patient presents with  . Follow-up    dm, hlp. pt states he has no medical concerns today    HPI  PMH: DM2, HTN, HLP, CAD s/p MI, ED, GERD, chronic right foot pain, gout, RLS  ? Last OV Jan 2021 - started gabapentin for RLS Podiatry jan 2021 Ophtho, march, April and may 2021 - no retinopathy Cards may 2021 - no changes, fu 1 year  Overall doing well Needs to resume walking, was walking 4 miles a day Reports gabapentin works well for RLS Denies any gout flare ups, he has not been taking allopurinol for months, has not been refilled Does not check cbgs Taking metformin, atorvastatin, plavix, metoprolol, chlorthalidone, protonix, daily  viagra prn  Does not need refills  Has no acute concerns today  Lab Results  Component Value Date   HGBA1C 6.4 (A) 04/10/2019   HGBA1C 6.2 (H) 10/05/2018   HGBA1C 6.3 07/14/2017   Lab Results  Component Value Date   LDLCALC 61 08/02/2019   CREATININE 0.95 08/02/2019   BP Readings from Last 3 Encounters:  08/02/19 122/78  04/10/19 118/80  12/06/18 124/85   Wt Readings from Last 3 Encounters:  08/02/19 217 lb (98.4 kg)  04/10/19 220 lb (99.8 kg)  12/06/18 215 lb 12.8 oz (97.9  kg)    Allergies  Allergen Reactions  . Ace Inhibitors Rash  . Ropinirole Hcl Rash    Prior to Admission medications   Medication Sig Start Date End Date Taking? Authorizing Provider  allopurinol (ZYLOPRIM) 300 MG tablet Take 300 mg by mouth daily. 05/04/18   [provider]  aspirin 81 MG tablet Take 81 mg by mouth daily.     [provider]  atorvastatin (LIPITOR) 40 MG tablet Take 1 tablet (40 mg total) by mouth daily. 04/10/19   Myles Lipps, MD  chlorthalidone (HYGROTON) 25 MG tablet Take 1 tablet (25 mg total) by mouth daily. 04/10/19   Myles Lipps, MD  clopidogrel (PLAVIX) 75 MG tablet TAKE 1 TABLET BY MOUTH EVERY DAY 07/06/19   Nahser, Deloris Ping, MD  docusate sodium (COLACE) 50 MG capsule Take 1 capsule (50 mg total) by mouth 2 (two) times daily. 10/13/18   Wallis Bamberg, PA-C  fluticasone (FLONASE) 50 MCG/ACT nasal spray Place 1 spray into both nostrils 2 (two) times daily. 04/10/19   Myles Lipps, MD  gabapentin (NEURONTIN) 300 MG capsule Take 1-2 capsules (300-600 mg total) by mouth at bedtime. 04/10/19   Myles Lipps, MD  metFORMIN (GLUCOPHAGE) 500 MG tablet Take 1 tablet (500 mg total) by mouth daily with breakfast. 04/10/19   Myles Lipps, MD  metoprolol succinate (TOPROL-XL) 25 MG 24 hr tablet Take 0.5 tablets (12.5 mg total) by mouth 2 (two)  times daily. 04/10/19   Myles Lipps, MD  multivitamin-iron-minerals-folic acid (CENTRUM) chewable tablet Chew 1 tablet by mouth daily.    [provider]  nitroGLYCERIN (NITROSTAT) 0.4 MG SL tablet Place 1 tablet (0.4 mg total) under the tongue every 5 (five) minutes as needed for chest pain. 07/27/19 10/25/19  Nahser, Deloris Ping, MD  Omega-3 Fatty Acids (FISH OIL) 1200 MG CAPS Take 2 capsules by mouth 2 (two) times daily.    [provider]  pantoprazole (PROTONIX) 40 MG tablet Take one po q day 04/10/19   Myles Lipps, MD  sildenafil (VIAGRA) 100 MG tablet Take 0.5-1 tablets (50-100 mg  total) by mouth daily as needed for erectile dysfunction. 04/10/19   Myles Lipps, MD  sodium phosphate (FLEET) 7-19 GM/118ML ENEM Place 133 mLs (1 enema total) rectally daily as needed for severe constipation. 10/13/18   Wallis Bamberg, PA-C    Past Medical History:  Diagnosis Date  . Acute MI, inferior wall, initial episode of care [facility]) 2009  . Anemia   . Arthritis   . Coronary atherosclerosis of unspecified type of vessel, native or graft   . GERD (gastroesophageal reflux disease)   . High cholesterol   . Hypertension   . Seasonal allergies   . Sleep apnea    "I've never worn a mask" (09/17/2016)    Past Surgical History:  Procedure Laterality Date  . APPENDECTOMY    . CHOLECYSTECTOMY OPEN  ~ 2008   "it exploded"  . CORONARY ANGIOPLASTY WITH STENT PLACEMENT  2009    2.75 x 28 mm Promus stent to the RCA  . FRACTURE SURGERY    . HERNIA REPAIR    . INSERTION OF MESH N/A 11/30/2016   Procedure: INSERTION OF MESH;  Surgeon: [name], MD;  Location: [facility] OR;  Service: General;  Laterality: N/A;  . LAPAROSCOPIC APPENDECTOMY N/A 09/16/2016   Procedure: APPENDECTOMY LAPAROSCOPIC;  Surgeon: [name], MD;  Location: [facility] OR;  Service: General;  Laterality: N/A;  . LAPAROSCOPIC INCISIONAL / UMBILICAL / VENTRAL HERNIA REPAIR  11/30/2016   w/repair serosal tear  . ORIF ANKLE FRACTURE Left 11/27/2015   Procedure: OPEN REDUCTION INTERNAL FIXATION (ORIF) ANKLE FRACTURE;  Surgeon: [name], MD;  Location: [facility];  Service: Orthopedics;  Laterality: Left;  [name] VENTRAL HERNIA REPAIR N/A 11/30/2016   Procedure: LAPAROSCOPIC VENTRAL HERNIA REPAIR AND  REPAIR OF SEROSAL TEAR;  Surgeon: [name], MD;  Location: [facility] OR;  Service: General;  Laterality: N/A;    Social History   Tobacco Use  . Smoking status: Former Smoker    Packs/day: 2.50    Years: 36.00    Pack years: 90.00    Types: Cigarettes    Quit date: 08/22/2007    Years since quitting: 12.1  . Smokeless tobacco:  Never Used  Substance Use Topics  . Alcohol use: Yes    Alcohol/week: 6.0 standard drinks    Types: 6 Cans of beer per week    Family History  Problem Relation Age of Onset  . Heart disease Mother   . Hypertension Father   . Prostate cancer Brother   . CAD Brother     Review of Systems  Constitutional: Negative for chills and fever.  Respiratory: Negative for cough and shortness of breath.   Cardiovascular: Negative for chest pain, palpitations and leg swelling.  Gastrointestinal: Negative for abdominal pain, nausea and vomiting.  Musculoskeletal: Positive for joint pain.  Neurological: Positive for tingling.  Objective  Vitals as reported by the patient: none  GEN: AAOx3, NAD HEENT: [place]/AT, pupils are symmetrical, EOMI, non-icteric sclera Resp: breathing comfortably, speaking in full sentences Skin: no rashes noted, no pallor Psych: good eye contact, normal mood and affect   ASSESSMENT and PLAN  1. Type 2 diabetes mellitus without complication, without long-term current use of insulin (HCC) Checking labs today, medications will be adjusted as needed. Cont working on LFM - Hemoglobin A1c; Future - Microalbumin / creatinine urine ratio; Future  2. RESTLESS LEGS SYNDROME Controlled. Continue current regime.   3. Mixed hyperlipidemia LDL at goal. TG elevated. Continue working on LFM  4. Essential hypertension, benign Controlled. Continue current regime.   5. History of gout - Uric Acid; Future - this will be off meds, denies any flareups  FOLLOW-UP: 6 months   The above assessment and management plan was discussed with the patient. The patient verbalized understanding of and has agreed to the management plan. Patient is aware to call the clinic if symptoms persist or worsen. Patient is aware when to return to the clinic for a follow-up visit. Patient educated on when it is appropriate to go to the emergency department.     Myles Lipps, MD Primary Care at  [facility] [address] [place], [place] [address] Ph.  [phone] Fax [phone]

## 2019-10-09 NOTE — Patient Instructions (Addendum)
Please make lab appointment within 1-2 weeks, does not need to be fasting. Urine sample will be requested. Start your daily walking regime again and weight loss efforts Next appointment due in 6 months    If you have lab work done today you will be contacted with your lab results within the next 2 weeks.  If you have not heard from [place] then please contact us. The fastest way to get your results is to register for My Chart.   IF you received an x-ray today, you will receive an invoice from [facility]. Please contact [facility] at [phone] with questions or concerns regarding your invoice.   IF you received labwork today, you will receive an invoice from [facility]. Please contact [facility] at [phone] with questions or concerns regarding your invoice.   Our billing staff will not be able to assist you with questions regarding bills from these companies.  You will be contacted with the lab results as soon as they are available. The fastest way to get your results is to activate your My Chart account. Instructions are located on the last page of this paperwork. If you have not heard from [place] regarding the results in 2 weeks, please contact this office.

## 2019-10-31 ENCOUNTER — Encounter (INDEPENDENT_AMBULATORY_CARE_PROVIDER_SITE_OTHER): Payer: BC Managed Care – PPO | Admitting: Ophthalmology

## 2019-12-13 ENCOUNTER — Telehealth: Payer: Self-pay | Admitting: Cardiovascular Disease

## 2019-12-13 DIAGNOSIS — I10 Essential (primary) hypertension: Secondary | ICD-10-CM | POA: Diagnosis not present

## 2019-12-13 NOTE — Telephone Encounter (Signed)
Called patient back about his message. Patient complaining about his BP going up and down. Patient stated his BP is usually around 120/78 in the mornings before he takes his BP medications (metoprolol and chlorthalidone) Patient stated his BP has been jumps up in the afternoon, some readings are 158/86 and 140/80. Patient stated his heart rate is normal, last reading HR 67. Patient stated he was concerned about the BP being elevated, so he took metoprolol 25 mg last night and metoprolol 25 mg this morning. Patient is suppose to take metoprolol 12.5 mg BID. Asked patient about any changes or new stressors in his life. Patient stated that he quit drinking caffeine, and he did have a headache for a while.  Informed patient it could be the caffeine withdrawal causing his BP to go up in the afternoons. Patient stated he is seeing his PCP tomorrow. Patient wanted to see what Dr. [name] thought.  Will forward to Dr. [name] for advisement.

## 2019-12-13 NOTE — Telephone Encounter (Signed)
The BP readings listed are only minimally elevated and are not critical. He should continue meds Measure his bp 1-2 hours after he takes his bp meds and report those values.  If he has further concerns, he will need to make an appts

## 2019-12-13 NOTE — Telephone Encounter (Signed)
Pt called and needed to speak with Dr. [name] or nurse regarding his BP. Please call back

## 2019-12-14 DIAGNOSIS — Z1331 Encounter for screening for depression: Secondary | ICD-10-CM | POA: Diagnosis not present

## 2019-12-14 DIAGNOSIS — Z1159 Encounter for screening for other viral diseases: Secondary | ICD-10-CM | POA: Diagnosis not present

## 2019-12-14 DIAGNOSIS — Z125 Encounter for screening for malignant neoplasm of prostate: Secondary | ICD-10-CM | POA: Diagnosis not present

## 2019-12-14 DIAGNOSIS — I10 Essential (primary) hypertension: Secondary | ICD-10-CM | POA: Diagnosis not present

## 2019-12-14 DIAGNOSIS — R0602 Shortness of breath: Secondary | ICD-10-CM | POA: Diagnosis not present

## 2019-12-14 DIAGNOSIS — Z0000 Encounter for general adult medical examination without abnormal findings: Secondary | ICD-10-CM | POA: Diagnosis not present

## 2019-12-14 DIAGNOSIS — R5383 Other fatigue: Secondary | ICD-10-CM | POA: Diagnosis not present

## 2019-12-14 DIAGNOSIS — E559 Vitamin D deficiency, unspecified: Secondary | ICD-10-CM | POA: Diagnosis not present

## 2019-12-14 NOTE — Telephone Encounter (Signed)
Left detailed message (DPR) to adv of input from Dr. [name] below.  Adv to call back w BP readings or send through MyChart.

## 2019-12-15 DIAGNOSIS — Z6832 Body mass index (BMI) 32.0-32.9, adult: Secondary | ICD-10-CM | POA: Diagnosis not present

## 2019-12-15 DIAGNOSIS — R942 Abnormal results of pulmonary function studies: Secondary | ICD-10-CM | POA: Diagnosis not present

## 2019-12-15 DIAGNOSIS — J302 Other seasonal allergic rhinitis: Secondary | ICD-10-CM | POA: Diagnosis not present

## 2019-12-15 DIAGNOSIS — I2510 Atherosclerotic heart disease of native coronary artery without angina pectoris: Secondary | ICD-10-CM | POA: Diagnosis not present

## 2019-12-15 DIAGNOSIS — Z008 Encounter for other general examination: Secondary | ICD-10-CM | POA: Diagnosis not present

## 2019-12-21 DIAGNOSIS — E789 Disorder of lipoprotein metabolism, unspecified: Secondary | ICD-10-CM | POA: Diagnosis not present

## 2019-12-21 DIAGNOSIS — I10 Essential (primary) hypertension: Secondary | ICD-10-CM | POA: Diagnosis not present

## 2019-12-21 DIAGNOSIS — E119 Type 2 diabetes mellitus without complications: Secondary | ICD-10-CM | POA: Diagnosis not present

## 2019-12-21 DIAGNOSIS — R0602 Shortness of breath: Secondary | ICD-10-CM | POA: Diagnosis not present

## 2019-12-25 DIAGNOSIS — Z87891 Personal history of nicotine dependence: Secondary | ICD-10-CM | POA: Diagnosis not present

## 2020-01-02 DIAGNOSIS — Z86010 Personal history of colonic polyps: Secondary | ICD-10-CM | POA: Diagnosis not present

## 2020-01-02 DIAGNOSIS — Z01818 Encounter for other preprocedural examination: Secondary | ICD-10-CM | POA: Diagnosis not present

## 2020-01-02 DIAGNOSIS — R945 Abnormal results of liver function studies: Secondary | ICD-10-CM | POA: Diagnosis not present

## 2020-01-11 ENCOUNTER — Other Ambulatory Visit: Payer: Self-pay | Admitting: Cardiovascular Disease

## 2020-01-12 DIAGNOSIS — K635 Polyp of colon: Secondary | ICD-10-CM | POA: Diagnosis not present

## 2020-01-12 DIAGNOSIS — Z1211 Encounter for screening for malignant neoplasm of colon: Secondary | ICD-10-CM | POA: Diagnosis not present

## 2020-01-19 DIAGNOSIS — K635 Polyp of colon: Secondary | ICD-10-CM | POA: Diagnosis not present

## 2020-01-31 DIAGNOSIS — M545 Low back pain, unspecified: Secondary | ICD-10-CM | POA: Diagnosis not present

## 2020-01-31 DIAGNOSIS — K648 Other hemorrhoids: Secondary | ICD-10-CM | POA: Diagnosis not present

## 2020-01-31 DIAGNOSIS — Z1211 Encounter for screening for malignant neoplasm of colon: Secondary | ICD-10-CM | POA: Diagnosis not present

## 2020-03-21 ENCOUNTER — Other Ambulatory Visit: Payer: Self-pay | Admitting: Family Medicine

## 2020-03-21 DIAGNOSIS — E119 Type 2 diabetes mellitus without complications: Secondary | ICD-10-CM

## 2020-03-21 DIAGNOSIS — I10 Essential (primary) hypertension: Secondary | ICD-10-CM

## 2020-03-21 MED ORDER — CHLORTHALIDONE 25 MG PO TABS: 25.0000 mg | ORAL_TABLET | Freq: Every day | ORAL | 1 refills | Status: DC

## 2020-03-21 MED ORDER — METFORMIN HCL 500 MG PO TABS: 500.0000 mg | ORAL_TABLET | Freq: Every day | ORAL | 1 refills | Status: DC

## 2020-03-21 NOTE — Telephone Encounter (Signed)
Medication Refill - Medication: chlorthalidone (HYGROTON) 25 MG tablet,metFORMIN (GLUCOPHAGE) 500 MG tablet (Patient stated that he only has 1 pill remaining of both medication and would like refills sent to pharmacy)    Has the patient contacted their pharmacy?yes (Agent: If no, request that the patient contact the pharmacy for the refill.) (Agent: If yes, when and what did the pharmacy advise?)Contact PCP  Preferred Pharmacy (with phone number or street name):  [id number] [place], [place] - [address] Phone:  [phone]  Fax:  [phone]       Agent: Please be advised that RX refills may take up to 3 business days. We ask that you follow-up with your pharmacy.

## 2020-03-21 NOTE — Telephone Encounter (Signed)
Requested medication (s) are due for refill today: yes  Requested medication (s) are on the active medication list: yes  Last refill: last filled by Dr. Pamella Pert  Future visit scheduled: no  Notes to clinic:  Please review for refill. Former patient of Dr. Pamella Pert    Requested Prescriptions  Pending Prescriptions Disp Refills   chlorthalidone (HYGROTON) 25 MG tablet 90 tablet 1    Sig: Take 1 tablet (25 mg total) by mouth daily.      Cardiovascular: Diuretics - Thiazide Passed - 03/21/2020  9:48 AM      Passed - Ca in normal range and within 360 days    Calcium  Date Value Ref Range Status  08/02/2019 9.8 8.6 - 10.2 mg/dL Final   Calcium, Ion  Date Value Ref Range Status  03/24/2010 1.15 1.12 - 1.32 mmol/L Final          Passed - Cr in normal range and within 360 days    Creat  Date Value Ref Range Status  11/11/2015 0.87 0.70 - 1.25 mg/dL Final    Comment:      For patients > or = 64 years of age: The upper reference limit for Creatinine is approximately 13% higher for people identified as African-American.      Creatinine, Ser  Date Value Ref Range Status  08/02/2019 0.95 0.76 - 1.27 mg/dL Final   Creatinine, Urine  Date Value Ref Range Status  12/05/2016 76.70 mg/dL Final          Passed - K in normal range and within 360 days    Potassium  Date Value Ref Range Status  08/02/2019 3.7 3.5 - 5.2 mmol/L Final          Passed - Na in normal range and within 360 days    Sodium  Date Value Ref Range Status  08/02/2019 137 134 - 144 mmol/L Final          Passed - Last BP in normal range    BP Readings from Last 1 Encounters:  08/02/19 122/78          Passed - Valid encounter within last 6 months    Recent Outpatient Visits           5 months ago Type 2 diabetes mellitus without complication, without long-term current use of insulin [facility])   Primary Care at [facility], Lilia Argue, MD   11 months ago Type 2 diabetes mellitus without  complication, without long-term current use of insulin [facility])   Primary Care at [facility], Lilia Argue, MD   1 year ago Gastroesophageal reflux disease without esophagitis   Primary Care at [facility], Rex Kras, MD   2 years ago    Primary Care at [facility], Ranell Patrick, MD   2 years ago Throat irritation   Primary Care at [facility], Ranell Patrick, MD                  metFORMIN (GLUCOPHAGE) 500 MG tablet 90 tablet 1    Sig: Take 1 tablet (500 mg total) by mouth daily with breakfast.      Endocrinology:  Diabetes - Biguanides Failed - 03/21/2020  9:48 AM      Failed - HBA1C is between 0 and 7.9 and within 180 days    Hemoglobin A1C  Date Value Ref Range Status  04/10/2019 6.4 (A) 4.0 - 5.6 % Final   Hgb A1c MFr Bld  Date Value Ref  Range Status  10/05/2018 6.2 (H) 4.8 - 5.6 % Final    Comment:             Prediabetes: 5.7 - 6.4          Diabetes: >6.4          Glycemic control for adults with diabetes: <7.0           Passed - Cr in normal range and within 360 days    Creat  Date Value Ref Range Status  11/11/2015 0.87 0.70 - 1.25 mg/dL Final    Comment:      For patients > or = 64 years of age: The upper reference limit for Creatinine is approximately 13% higher for people identified as African-American.      Creatinine, Ser  Date Value Ref Range Status  08/02/2019 0.95 0.76 - 1.27 mg/dL Final   Creatinine, Urine  Date Value Ref Range Status  12/05/2016 76.70 mg/dL Final          Passed - eGFR in normal range and within 360 days    GFR, Est African American  Date Value Ref Range Status  03/13/2015 >89 >=60 mL/min Final   GFR calc Af Amer  Date Value Ref Range Status  08/02/2019 98 >59 mL/min/1.73 Final    Comment:    **[facility] currently reports eGFR in compliance with the current**   recommendations of the [employer]. [facility] will   update reporting as new guidelines are published from the NKF-ASN   Task force.    GFR,  Est Non African American  Date Value Ref Range Status  03/13/2015 >89 >=60 mL/min Final    Comment:      The estimated GFR is a calculation valid for adults (>=58 years old) that uses the CKD-EPI algorithm to adjust for age and sex. It is   not to be used for children, pregnant women, hospitalized patients,    patients on dialysis, or with rapidly changing kidney function. According to the NKDEP, eGFR >89 is normal, 60-89 shows mild impairment, 30-59 shows moderate impairment, 15-29 shows severe impairment and <15 is ESRD.      GFR calc non Af Amer  Date Value Ref Range Status  08/02/2019 85 >59 mL/min/1.73 Final   GFR  Date Value Ref Range Status  12/19/2013 84.41 >60.00 mL/min Final          Passed - Valid encounter within last 6 months    Recent Outpatient Visits           5 months ago Type 2 diabetes mellitus without complication, without long-term current use of insulin [facility])   Primary Care at [facility], Lilia Argue, MD   11 months ago Type 2 diabetes mellitus without complication, without long-term current use of insulin [facility])   Primary Care at [facility], Lilia Argue, MD   1 year ago Gastroesophageal reflux disease without esophagitis   Primary Care at [facility], Rex Kras, MD   2 years ago    Primary Care at [facility], Ranell Patrick, MD   2 years ago Throat irritation   Primary Care at [facility], Ranell Patrick, MD

## 2020-06-14 ENCOUNTER — Other Ambulatory Visit: Payer: Self-pay | Admitting: Cardiovascular Disease

## 2020-06-14 ENCOUNTER — Telehealth: Payer: Self-pay | Admitting: Cardiovascular Disease

## 2020-06-14 ENCOUNTER — Telehealth: Payer: Self-pay | Admitting: Registered Nurse

## 2020-06-14 ENCOUNTER — Other Ambulatory Visit: Payer: Self-pay

## 2020-06-14 ENCOUNTER — Other Ambulatory Visit: Payer: Self-pay | Admitting: Family Medicine

## 2020-06-14 DIAGNOSIS — I10 Essential (primary) hypertension: Secondary | ICD-10-CM

## 2020-06-14 MED ORDER — METOPROLOL SUCCINATE ER 25 MG PO TB24: 12.5000 mg | ORAL_TABLET | Freq: Two times a day (BID) | ORAL | 3 refills | Status: DC

## 2020-06-14 MED ORDER — ATORVASTATIN CALCIUM 40 MG PO TABS: 40.0000 mg | ORAL_TABLET | Freq: Every day | ORAL | 0 refills | Status: DC

## 2020-06-14 NOTE — Telephone Encounter (Signed)
06/14/2020 - PATIENT CAME BY THE OFFICE STATING HE DOES NOT HAVE ANYMORE REFILLS ON HIS METOPROLOL SUCCINATE 25 mg AND ATORVASTATIN 40 mg. HE IS TOTALLY OUT AND NEEDS AS SOON AS POSSIBLE. HE WILL CALL [facility] TO SEE WHEN HE CAN GET A NEW PATIENT APPOINTMENT. BEST PHONE [phone] (CELL)  [facility] IS: [facility] [address]  [facility]

## 2020-06-14 NOTE — Telephone Encounter (Signed)
*  STAT* If patient is at the pharmacy, call can be transferred to refill team.   1. Which medications need to be refilled? (please list name of each medication and dose if known)  atorvastatin (LIPITOR) 40 MG tablet  2. Which pharmacy/location (including street and city if local pharmacy) is medication to be sent to? [id number] [place], [place] - [address]  3. Do they need a 30 day or 90 day supply? 90 with refills   Patient is out of medication

## 2020-06-14 NOTE — Telephone Encounter (Signed)
Pt's medication was sent to pt's pharmacy as requested. Confirmation received.  °

## 2020-06-14 NOTE — Telephone Encounter (Signed)
Sent will need to est new PCP

## 2020-06-26 ENCOUNTER — Telehealth: Payer: Self-pay | Admitting: Cardiovascular Disease

## 2020-06-26 DIAGNOSIS — R12 Heartburn: Secondary | ICD-10-CM

## 2020-06-26 MED ORDER — PANTOPRAZOLE SODIUM 40 MG PO TBEC: DELAYED_RELEASE_TABLET | ORAL | 3 refills | Status: DC

## 2020-06-26 NOTE — Telephone Encounter (Signed)
Medication refill request for Pantoprazole 40 mg tablets [facility]

## 2020-06-26 NOTE — Telephone Encounter (Signed)
*  STAT* If patient is at the pharmacy, call can be transferred to refill team.   1. Which medications need to be refilled? (please list name of each medication and dose if known) pantoprazole (PROTONIX) 40 MG tablet  2. Which pharmacy/location (including street and city if local pharmacy) is medication to be sent to? [id number] [place],  - [address]  3. Do they need a 30 day or 90 day supply? 90 day  Patient is out of medication

## 2020-06-27 ENCOUNTER — Other Ambulatory Visit: Payer: Self-pay

## 2020-06-28 ENCOUNTER — Encounter: Payer: Self-pay | Admitting: Family Medicine

## 2020-06-28 ENCOUNTER — Ambulatory Visit: Payer: BC Managed Care – PPO | Admitting: Family Medicine

## 2020-06-28 VITALS — BP 124/80 | HR 80 | Temp 97.6°F | Ht 69.0 in | Wt 218.0 lb

## 2020-06-28 DIAGNOSIS — K219 Gastro-esophageal reflux disease without esophagitis: Secondary | ICD-10-CM

## 2020-06-28 DIAGNOSIS — Z86711 Personal history of pulmonary embolism: Secondary | ICD-10-CM | POA: Insufficient documentation

## 2020-06-28 DIAGNOSIS — E119 Type 2 diabetes mellitus without complications: Secondary | ICD-10-CM

## 2020-06-28 DIAGNOSIS — Z8781 Personal history of (healed) traumatic fracture: Secondary | ICD-10-CM | POA: Insufficient documentation

## 2020-06-28 DIAGNOSIS — J309 Allergic rhinitis, unspecified: Secondary | ICD-10-CM

## 2020-06-28 DIAGNOSIS — E782 Mixed hyperlipidemia: Secondary | ICD-10-CM

## 2020-06-28 DIAGNOSIS — E1142 Type 2 diabetes mellitus with diabetic polyneuropathy: Secondary | ICD-10-CM

## 2020-06-28 DIAGNOSIS — E11319 Type 2 diabetes mellitus with unspecified diabetic retinopathy without macular edema: Secondary | ICD-10-CM | POA: Insufficient documentation

## 2020-06-28 DIAGNOSIS — I2510 Atherosclerotic heart disease of native coronary artery without angina pectoris: Secondary | ICD-10-CM

## 2020-06-28 DIAGNOSIS — N521 Erectile dysfunction due to diseases classified elsewhere: Secondary | ICD-10-CM

## 2020-06-28 MED ORDER — TADALAFIL 10 MG PO TABS: 10.0000 mg | ORAL_TABLET | ORAL | 1 refills | Status: DC | PRN

## 2020-06-28 MED ORDER — FLUTICASONE PROPIONATE 50 MCG/ACT NA SUSP: 1.0000 | Freq: Two times a day (BID) | NASAL | 6 refills | Status: DC

## 2020-06-28 NOTE — Progress Notes (Signed)
[facility] [address] [place] [address] Dept: [phone] Dept Fax: [phone]  Transfer of Care Office Visit  Subjective:    Patient ID: Chris [name], male    DOB: 07/04/1955, 65 y.o..   MRN: [id number]  Chief Complaint  Patient presents with  . Establish Care    NP- Establish Care, refills fluticasone.    History of Present Illness:  Patient is in today to establish care. Chris [name] is originally from [place], [place]. His family moved ot [place] when he was in 5th grade. He is divorced, but remarried. He has two sons of his own and 2 step-children. 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 He denies tobacco or drug use. He drinks occasionally.  Chris [name] has a history of Type 2 diabetes complicated with neuropathy and retinopathy. Although his record indicates he was prescribed metformin 1000 mg bid, he has only been taking this daily. He sees a podiatrist for foot care and is managed with gabapentin for his neuropathy. He sees an ophthalmologist for his eyes. He notes he did have laser treatment of a "black spot" on his eye back in Nov.  Chris [name] has a history of allergic rhinitis. He notes Flonase does help to control this. He needs a refill of this medication today.  Chris [name] has a past history of a myocardial infarction and is s/p placement of 2 cardiac stents. He is on an aspirin, Plavix, metoprolol, and has NTG available. He also has hyperlipidemia and is treated with atorvastatin, fenofibrate, and fish oil.  Chris [name] has  a history of GERD well managed with Protonix.  Chris [name] has a history of erectile dysfunction. He has been managed with Viagra, but notes this is not as effective for him anymore. Also, he notes he gets facial flushing when he takes this.  Past Medical History: Patient Active Problem  List   Diagnosis Date Noted  . Diabetic peripheral neuropathy (HCC) 06/28/2020  . Diabetic retinopathy (HCC) 06/28/2020  . Allergic rhinitis 06/28/2020  . Type 2 diabetes mellitus without complication, without long-term current use of insulin (HCC) 10/05/2018  . Plantar fasciitis 10/05/2018  . Right-sided epistaxis 12/16/2016  . GERD (gastroesophageal reflux disease) 12/05/2016  . Ventral hernia without obstruction or gangrene 11/30/2016  . History of MI (myocardial infarction) 05/21/2014  . Erectile dysfunction 07/25/2012  . Restless leg syndrome 02/20/2010  . Hyperlipidemia 02/19/2010  . Obstructive sleep apnea 02/19/2010  . Coronary artery disease 02/19/2010   Past Surgical History:  Procedure Laterality Date  . APPENDECTOMY    . CHOLECYSTECTOMY OPEN  ~ 2008   "it exploded"  . CORONARY ANGIOPLASTY WITH STENT PLACEMENT  2009    2.75 x 28 mm Promus stent to the RCA  . FRACTURE SURGERY    . HERNIA REPAIR    . INSERTION OF MESH N/A 11/30/2016   Procedure: INSERTION OF MESH;  Surgeon: [name], MD;  [facility] OR;  Service: General;  Laterality: N/A;  . LAPAROSCOPIC APPENDECTOMY N/A 09/16/2016   Procedure: APPENDECTOMY LAPAROSCOPIC;  Surgeon: [name], MD;  Location: [facility] OR;  Service: General;  Laterality: N/A;  . LAPAROSCOPIC INCISIONAL / UMBILICAL / VENTRAL HERNIA REPAIR  11/30/2016   w/repair serosal tear  . ORIF ANKLE FRACTURE Left 11/27/2015   Procedure: OPEN REDUCTION INTERNAL FIXATION (ORIF) ANKLE FRACTURE;  Surgeon: [name], MD;  Location: [facility];  Service: Orthopedics;  Laterality: Left;  .  VENTRAL HERNIA REPAIR N/A 11/30/2016   Procedure: LAPAROSCOPIC VENTRAL HERNIA REPAIR AND  REPAIR OF SEROSAL TEAR;  Surgeon: [name], MD;  Location: [facility] OR;  Service: General;  Laterality: N/A;   Family History  Problem Relation Age of Onset  . Heart disease Mother   . Hypertension Father   . Prostate cancer Brother   . CAD Brother    Outpatient  Medications Prior to Visit  Medication Sig Dispense Refill  . allopurinol (ZYLOPRIM) 300 MG tablet Take 300 mg by mouth daily.    [name] aspirin 81 MG tablet Take 81 mg by mouth daily.    [name] atorvastatin (LIPITOR) 40 MG tablet Take 1 tablet (40 mg total) by mouth daily. Please make yearly appt with Dr. [name] for May 2022 before anymore refills. Thank you 1st attempt 90 tablet 0  . chlorthalidone (HYGROTON) 25 MG tablet TAKE 1 TABLET (25 MG TOTAL) BY MOUTH DAILY. 30 tablet 0  . clopidogrel (PLAVIX) 75 MG tablet Take 1 tablet (75 mg total) by mouth daily. Please make yearly appt with Dr. [name] for May 2022 for future refills. Thank you 1st attempt 90 tablet 0  . gabapentin (NEURONTIN) 300 MG capsule Take 1-2 capsules (300-600 mg total) by mouth at bedtime. 180 capsule 1  . metFORMIN (GLUCOPHAGE) 1000 MG tablet Take 1 tablet by mouth 1 day or 1 dose.    . metoprolol succinate (TOPROL-XL) 25 MG 24 hr tablet Take 0.5 tablets (12.5 mg total) by mouth 2 (two) times daily. 90 tablet 3  . multivitamin-iron-minerals-folic acid (CENTRUM) chewable tablet Chew 1 tablet by mouth daily.    . nitroGLYCERIN (NITROSTAT) 0.4 MG SL tablet Place 1 tablet (0.4 mg total) under the tongue every 5 (five) minutes as needed for chest pain. 100 tablet 1  . Omega-3 Fatty Acids (FISH OIL) 1200 MG CAPS Take 2 capsules by mouth 2 (two) times daily.    . pantoprazole (PROTONIX) 40 MG tablet Take one po q day 90 tablet 3  . sildenafil (VIAGRA) 100 MG tablet Take 0.5-1 tablets (50-100 mg total) by mouth daily as needed for erectile dysfunction. 30 tablet 1  . fenofibrate micronized (LOFIBRA) 134 MG capsule Take 134 mg by mouth daily.    . fluticasone (FLONASE) 50 MCG/ACT nasal spray Place 1 spray into both nostrils 2 (two) times daily. 16 g 6  . docusate sodium (COLACE) 50 MG capsule Take 1 capsule (50 mg total) by mouth 2 (two) times daily. (Patient not taking: Reported on 06/28/2020) 60 capsule 5  . metFORMIN (GLUCOPHAGE) 500 MG tablet  Take 1 tablet (500 mg total) by mouth daily with breakfast. (Patient not taking: Reported on 06/28/2020) 90 tablet 1  . sodium phosphate (FLEET) 7-19 GM/118ML ENEM Place 133 mLs (1 enema total) rectally daily as needed for severe constipation. (Patient not taking: Reported on 06/28/2020) 5 enema 5   No facility-administered medications prior to visit.   Allergies  Allergen Reactions  . Ace Inhibitors Rash  . Ropinirole Hcl Rash     Objective:   Today's Vitals   06/28/20 1452  BP: 124/80  Pulse: 80  Temp: 97.6 F (36.4 C)  TempSrc: Temporal  SpO2: 96%  Weight: 218 lb (98.9 kg)  Height: 5\' 9"  (1.753 m)   Body mass index is 32.19 kg/m.   General: Well developed, well nourished. No acute distress. Extremities: There are well-healed scars over the lateral aspects of the left ankle. Examination of both feet show   the skin to be intact  with normal hair growth. DP and PT pulses are 2+. %.07 monofilament testing   demonstrates a localized area of numbness in the mid forefoot. Psych: Alert and oriented. Normal mood and affect.  Health Maintenance Due  Topic Date Due  . COVID-19 Vaccine (1) Never done  . URINE MICROALBUMIN  10/05/2019  . HEMOGLOBIN A1C  10/08/2019     Assessment & Plan:   1. Type 2 diabetes mellitus without complication, without long-term current use of insulin [facility]) Mr. Halbert is due for annual DM labs to reassess his diabetes control and screen for nephropathy. I will continue his current dose of metformin, pending lab results. He is not currently on an ACE-I or an ARB, but he may not tolerate this, in light of his current BP. The metoprolol would be important s/P MI, so will continue this for now.  - Microalbumin / creatinine urine ratio; Future - Basic metabolic panel; Future - Hemoglobin A1c; Future - Urinalysis, Routine w reflex microscopic; Future  2. Coronary artery disease involving native coronary artery of native heart without angina pectoris Stable.  Follows with Dr. Lourena Simmonds. On Plavix, ASA, and metoprolol.   3. Gastroesophageal reflux disease without esophagitis Stable on Protonix.  4. Diabetic peripheral neuropathy (HCC) Stable on gabapentin. Followed with podiatry.  5. Mixed hyperlipidemia We will continue atorvastatin and fenofibrate pending lipid results.  - Lipid panel; Future  6. Erectile dysfunction due to diseases classified elsewhere We discussed a trial of Cialis to see if this is more effective. Mr. Bossman had concerns about whether his insurance will cover this. He will let me know if he has trouble with the cost or co-pay.  7. Allergic rhinitis, unspecified seasonality, unspecified trigger Having typical springtime flare. I will renew his Flonase.  - fluticasone (FLONASE) 50 MCG/ACT nasal spray; Place 1 spray into both nostrils 2 (two) times daily.  Dispense: 16 g; Refill: 6  Loyola Mast, MD

## 2020-07-02 ENCOUNTER — Other Ambulatory Visit: Payer: Self-pay

## 2020-07-02 ENCOUNTER — Other Ambulatory Visit (INDEPENDENT_AMBULATORY_CARE_PROVIDER_SITE_OTHER): Payer: BC Managed Care – PPO

## 2020-07-02 DIAGNOSIS — E119 Type 2 diabetes mellitus without complications: Secondary | ICD-10-CM | POA: Diagnosis not present

## 2020-07-02 DIAGNOSIS — E782 Mixed hyperlipidemia: Secondary | ICD-10-CM | POA: Diagnosis not present

## 2020-07-02 LAB — URINALYSIS, ROUTINE W REFLEX MICROSCOPIC
Bilirubin Urine: NEGATIVE
Hgb urine dipstick: NEGATIVE
Ketones, ur: NEGATIVE
Leukocytes,Ua: NEGATIVE
Nitrite: NEGATIVE
RBC / HPF: NONE SEEN (ref 0–?)
Specific Gravity, Urine: 1.01 (ref 1.000–1.030)
Total Protein, Urine: NEGATIVE
Urine Glucose: NEGATIVE
Urobilinogen, UA: 0.2 (ref 0.0–1.0)
pH: 8 (ref 5.0–8.0)

## 2020-07-02 LAB — BASIC METABOLIC PANEL
BUN: 15 mg/dL (ref 6–23)
CO2: 30 mEq/L (ref 19–32)
Calcium: 10 mg/dL (ref 8.4–10.5)
Chloride: 101 mEq/L (ref 96–112)
Creatinine, Ser: 1.02 mg/dL (ref 0.40–1.50)
GFR: 77.5 mL/min (ref >60.00)
Glucose, Bld: 106 mg/dL — ABNORMAL HIGH (ref 70–99)
Potassium: 4.3 mEq/L (ref 3.5–5.1)
Sodium: 140 mEq/L (ref 135–145)

## 2020-07-02 LAB — LIPID PANEL
Cholesterol: 119 mg/dL (ref 0–200)
HDL: 36.3 mg/dL — ABNORMAL LOW (ref >39.00)
LDL Cholesterol: 54 mg/dL (ref 0–99)
NonHDL: 82.23
Total CHOL/HDL Ratio: 3
Triglycerides: 141 mg/dL (ref 0.0–149.0)
VLDL: 28.2 mg/dL (ref 0.0–40.0)

## 2020-07-02 LAB — MICROALBUMIN / CREATININE URINE RATIO
Creatinine,U: 131.2 mg/dL
Microalb Creat Ratio: 0.5 mg/g (ref 0.0–30.0)
Microalb, Ur: 0.7 mg/dL (ref 0.0–1.9)

## 2020-07-02 LAB — HEMOGLOBIN A1C: Hgb A1c MFr Bld: 6.7 % — ABNORMAL HIGH (ref 4.6–6.5)

## 2020-07-02 NOTE — Progress Notes (Signed)
Per orders of Dr. Veto Kemps pt is here for lab draw. Pt tolerated the draw well.

## 2020-07-31 ENCOUNTER — Encounter: Payer: BC Managed Care – PPO | Admitting: Cardiovascular Disease

## 2020-07-31 ENCOUNTER — Encounter: Payer: Self-pay | Admitting: Cardiovascular Disease

## 2020-07-31 ENCOUNTER — Telehealth: Payer: Self-pay | Admitting: Cardiovascular Disease

## 2020-07-31 NOTE — Telephone Encounter (Signed)
Left message for patient to call back  

## 2020-07-31 NOTE — Progress Notes (Signed)
This encounter was created in error - please disregard.

## 2020-07-31 NOTE — Telephone Encounter (Signed)
Patient was calling to speak with the nurse about his appt. Please advise

## 2020-08-01 ENCOUNTER — Ambulatory Visit (INDEPENDENT_AMBULATORY_CARE_PROVIDER_SITE_OTHER): Payer: BC Managed Care – PPO | Admitting: Cardiovascular Disease

## 2020-08-01 ENCOUNTER — Encounter: Payer: Self-pay | Admitting: Cardiovascular Disease

## 2020-08-01 ENCOUNTER — Other Ambulatory Visit: Payer: Self-pay

## 2020-08-01 VITALS — BP 138/84 | HR 61 | Ht 69.0 in | Wt 218.6 lb

## 2020-08-01 DIAGNOSIS — E782 Mixed hyperlipidemia: Secondary | ICD-10-CM

## 2020-08-01 DIAGNOSIS — I2510 Atherosclerotic heart disease of native coronary artery without angina pectoris: Secondary | ICD-10-CM

## 2020-08-01 NOTE — Progress Notes (Signed)
Chris [name]  Date of Birth  1956/01/01   Problems: 1. Coronary artery disease- He presented in 2009 with some episodes of chest pain and was found to have an inferior wall myocardial infarction. He underwent successful PTCA and stenting of his mid  right coronary artery using a 2.75 x 28 mm Promus stent. The stent was post dilated using a 3.0 noncompliant balloon.  We then positioned a 3.0 x 15 mm Promus stent in the proximal right coronary artery.  The stent was post dilated using a 3.25 mm noncompliant balloon. 2. Hyperlipidemia 3. Sleep apnea 4. Hypertension     Chris [name] is a 65 year old gentleman. He has a history of coronary artery disease. He presented in 2009 with some episodes of chest pain and was found to have an inferior wall myocardial infarction. He underwent successful PTCA and stenting of his mid  right coronary artery using a 2.75 x 28 mm Promus stent. The stent was post dilated using a 3.0 noncompliant balloon.  We then positioned a 3.0 x 15 mm Promus stent in the proximal right coronary artery.  The stent was post dilated using a 3.25 mm noncompliant balloon.  Pt is doing well.  No angina or dyspnea.    He has not been sleeping well.  He also did not take his meds this am.  April 13, 2012: Chris [name] is doing well from a cardiac standpoint.  Exercising on the treadmill every other day. Complains of constipation.  October 17, 2012:  Thank started having some episodes of chest pain last week.  He had been doing lots of physical activity - played 36 holes of golf a day  for 2 days straight.  He also has been busy doing phyisical work.     He had a stress Myoview study. He walked for 11 minutes on a standard Bruce protocol triple test. A Myoview study revealed an old inferior wall myocardial infarction but was otherwise unremarkable.   June 14, 2013:  He is doing well .Chris [name] Has some back pain and "crick" in his neck.  No CP .  Staying active, walking in the evenings 2.5 miles    Sept. 29, 2015:  Chris [name] is doing ok.  He is not working at [employer].   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  Doing well from a cardiac standpoint.  Still walking quite a bit.    Aug. 29, 2016:  Doing well. Owns his own business.  [name]  No CP , no dyspnea.   Jan. 17, 2017: No angina  Is staying busy Working hard.   Oct. 9, 2017:   Chris [name] is seen today for follow up of his CAD Is healing up from a left ankle injury.  Walking on crutches .   Wears him out.  Has not had any chest pain  Or dyspnea  Aug. 14, 2018: Chris [name] is seen for follow up for his CAD Had emergent Appy surgery. Was found to have a ventral hernia at that time. Now needs to have hernia repair.  He had no complications with his emergent appendectomy. He's been doing well. No episodes of chest pain or short of breath.  Feb. 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   No CP or dyspnea Works out on th eElliptical  Primary MD added Chlorthaladone .   2 years ago , he had broke his left ankle and developed a DVT / pulmonary  embolus .    Sept.  11, 2019  Having some pain in right shoulder.  Slept on it wrong .   May 16, 2018: Seen today for follow-up visit regarding his coronary artery disease.  He also has a history of hyperlipidemia and diabetes mellitus. Last lipid levels were from December 01, 2017: Total cholesterol is 130.  LDL is 54.  His triglyceride level is 203.  Having some right shoulder issues.   Getting a MRI today  May need to have shoulder surgery  He is at low risk for CV complications during shoulder surgery.  Sept. 15, 2020  Abdo is seen today  Has a friend who is selling a [employer] tractor   Stays busy. No CP .  Walks 4 miles a day .   May , 12, 2021  Doing well.   No CP  Did lots of work yesterday - removed his sidewalk yesterday Walks 4 miles twice a day  Has not had covid vaccines yet .    Aug 01, 2020: Chris [name] is doing welll. Hx of CAD HLD. Out mowing [employer] field behind his house  No CP ,  Walks 4 miles a day .   [place]     Current Outpatient Medications on File Prior to Visit  Medication Sig Dispense Refill  . allopurinol (ZYLOPRIM) 300 MG tablet Take 300 mg by mouth daily.    Chris [name] aspirin 81 MG tablet Take 81 mg by mouth daily.    Chris [name] atorvastatin (LIPITOR) 40 MG tablet Take 1 tablet (40 mg total) by mouth daily. Please make yearly appt with Dr. [name] for May 2022 before anymore refills. Thank you 1st attempt 90 tablet 0  . chlorthalidone (HYGROTON) 25 MG tablet TAKE 1 TABLET (25 MG TOTAL) BY MOUTH DAILY. 30 tablet 0  . clopidogrel (PLAVIX) 75 MG tablet Take 1 tablet (75 mg total) by mouth daily. Please make yearly appt with Dr. [name] for May 2022 for future refills. Thank you 1st attempt 90 tablet 0  . fenofibrate micronized (LOFIBRA) 134 MG capsule Take 134 mg by mouth daily.    . fluticasone (FLONASE) 50 MCG/ACT nasal spray Place 1 spray into both nostrils 2 (two) times daily. 16 g 6  . gabapentin (NEURONTIN) 300 MG capsule Take 1-2 capsules (300-600 mg total) by mouth at bedtime. 180 capsule 1  . metFORMIN (GLUCOPHAGE) 1000 MG tablet Take 1 tablet by mouth 1 day or 1 dose.    . metoprolol succinate (TOPROL-XL) 25 MG 24 hr tablet Take 0.5 tablets (12.5 mg total) by mouth 2 (two) times daily. 90 tablet 3  . multivitamin-iron-minerals-folic acid (CENTRUM) chewable tablet Chew 1 tablet by mouth daily.    . Omega-3 Fatty Acids (FISH OIL) 1200 MG CAPS Take 2 capsules by mouth 2 (two) times daily.    . pantoprazole (PROTONIX) 40 MG tablet Take one po q day 90 tablet 3  . tadalafil (CIALIS) 10 MG tablet Take 1 tablet (10 mg total) by mouth every other day as needed for erectile dysfunction. 10 tablet 1  . nitroGLYCERIN (NITROSTAT) 0.4 MG SL tablet Place 1 tablet (0.4 mg total) under the tongue every 5 (five) minutes as needed for chest  pain. 100 tablet 1   No current facility-administered medications on file prior to visit.    Allergies  Allergen Reactions  . Ace Inhibitors Rash  . Ropinirole Hcl Rash    Past Medical History:  Diagnosis Date  . Acute MI,  inferior wall, initial episode of care [facility]) 2009  . Allergy   . Anemia   . Arthritis   . Coronary atherosclerosis of unspecified type of vessel, native or graft   . GERD (gastroesophageal reflux disease)   . High cholesterol   . Hypertension   . Seasonal allergies   . Sleep apnea    "I've never worn a mask" (09/17/2016)    Past Surgical History:  Procedure Laterality Date  . APPENDECTOMY    . CHOLECYSTECTOMY OPEN  ~ 2008   "it exploded"  . CORONARY ANGIOPLASTY WITH STENT PLACEMENT  2009    2.75 x 28 mm Promus stent to the RCA  . FRACTURE SURGERY    . HERNIA REPAIR    . INSERTION OF MESH N/A 11/30/2016   Procedure: INSERTION OF MESH;  Surgeon: [name], MD;  Location: [facility] OR;  Service: General;  Laterality: N/A;  . LAPAROSCOPIC APPENDECTOMY N/A 09/16/2016   Procedure: APPENDECTOMY LAPAROSCOPIC;  Surgeon: [name], MD;  Location: [facility] OR;  Service: General;  Laterality: N/A;  . LAPAROSCOPIC INCISIONAL / UMBILICAL / VENTRAL HERNIA REPAIR  11/30/2016   w/repair serosal tear  . ORIF ANKLE FRACTURE Left 11/27/2015   Procedure: OPEN REDUCTION INTERNAL FIXATION (ORIF) ANKLE FRACTURE;  Surgeon: [name], MD;  Location: [facility];  Service: Orthopedics;  Laterality: Left;  Chris [name] VENTRAL HERNIA REPAIR N/A 11/30/2016   Procedure: LAPAROSCOPIC VENTRAL HERNIA REPAIR AND  REPAIR OF SEROSAL TEAR;  Surgeon: [name], MD;  Location: [facility] OR;  Service: General;  Laterality: N/A;    Social History   Tobacco Use  Smoking Status Former Smoker  . Packs/day: 2.50  . Years: 36.00  . Pack years: 90.00  . Types: Cigarettes  . Quit date: 08/22/2007  . Years since quitting: 12.9  Smokeless Tobacco Never Used    Social History   Substance and Sexual  Activity  Alcohol Use Yes  . Alcohol/week: 6.0 standard drinks  . Types: 6 Cans of beer per week    Family History  Problem Relation Age of Onset  . Heart disease Mother   . Hypertension Father   . Prostate cancer Brother   . CAD Brother     Reviw of Systems:  Reviewed in the HPI.  All other systems are negative.     EKG:    Aug 01, 2020: Sinus bradycardia 57.  Previous inferior wall myocardial infarction.  No ST or T wave changes.   Assessment / Plan:   .1. Coronary artery disease-    Hx of inferior MI in 2009 . S/p stenting .  Doing well .   No angaina  2. Hyperlipidemia-     Lipids look good. Encouraged better diet   3. Sleep apnea- per primary MD   4. Hypertension -    BP looks good   5.  ED :      Per primary md        Kristeen Miss, MD  08/01/2020 10:09 AM    [facility] [address] [place],  [address] [place], [place]  [address] Pager [phone] Phone: [phone]; Fax: [phone]

## 2020-08-01 NOTE — Patient Instructions (Signed)
Medication Instructions:  Your physician recommends that you continue on your current medications as directed. Please refer to the Current Medication list given to you today.  *If you need a refill on your cardiac medications before your next appointment, please call your pharmacy*   Lab Work: Lipids, ALT, BMET Your physician recommends that you return for lab work in: 1 year on the day of or a few days before your office visit with Dr. Nahser.  You will need to FAST for this appointment - nothing to eat or drink after midnight the night before except water.     Testing/Procedures: none   Follow-Up: At [facility], you and your health needs are our priority.  As part of our continuing mission to provide you with exceptional heart care, we have created designated Provider Care Teams.  These Care Teams include your primary Cardiologist (physician) and Advanced Practice Providers (APPs -  Physician Assistants and Nurse Practitioners) who all work together to provide you with the care you need, when you need it.   Your next appointment:   1 year(s)  The format for your next appointment:   In Person  Provider:   You may see Philip Nahser, MD or one of the following Advanced Practice Providers on your designated Care Team:    Scott Weaver, PA-C  Vin Bhagat, PA-C    

## 2020-09-06 ENCOUNTER — Other Ambulatory Visit: Payer: Self-pay | Admitting: Cardiovascular Disease

## 2020-09-06 MED ORDER — NIRMATRELVIR/RITONAVIR (PAXLOVID)TABLET: 3.0000 | ORAL_TABLET | Freq: Two times a day (BID) | ORAL | 0 refills | Status: AC

## 2020-09-06 MED ORDER — NIRMATRELVIR/RITONAVIR (PAXLOVID)TABLET: 3.0000 | ORAL_TABLET | Freq: Two times a day (BID) | ORAL | 0 refills | Status: DC

## 2020-09-06 NOTE — Progress Notes (Signed)
Chris [name] was diagnosed with Covid yesterday  Will add Paxlovid to his medications Dose verified with Margaretmary Dys, Pharm D.     Kristeen Miss, MD  09/06/2020 12:31 PM    [facility] [address] [place],  [address] [place], [place]  [address] Phone: [phone]; Fax: [phone]

## 2020-09-09 ENCOUNTER — Other Ambulatory Visit: Payer: Self-pay | Admitting: Cardiovascular Disease

## 2020-09-30 ENCOUNTER — Ambulatory Visit: Payer: BC Managed Care – PPO | Admitting: Family Medicine

## 2020-10-04 ENCOUNTER — Ambulatory Visit: Payer: Self-pay | Admitting: Family Medicine

## 2020-10-30 ENCOUNTER — Other Ambulatory Visit: Payer: Self-pay | Admitting: Cardiovascular Disease

## 2020-11-01 ENCOUNTER — Other Ambulatory Visit: Payer: Self-pay | Admitting: Family Medicine

## 2020-11-01 DIAGNOSIS — J309 Allergic rhinitis, unspecified: Secondary | ICD-10-CM

## 2021-06-08 ENCOUNTER — Other Ambulatory Visit: Payer: Self-pay | Admitting: Registered Nurse

## 2021-06-08 ENCOUNTER — Other Ambulatory Visit: Payer: Self-pay | Admitting: Cardiovascular Disease

## 2021-06-08 DIAGNOSIS — R12 Heartburn: Secondary | ICD-10-CM

## 2021-06-17 ENCOUNTER — Other Ambulatory Visit: Payer: Self-pay

## 2021-06-17 MED ORDER — METOPROLOL SUCCINATE ER 25 MG PO TB24: 12.5000 mg | ORAL_TABLET | Freq: Two times a day (BID) | ORAL | 0 refills | Status: DC

## 2021-06-17 NOTE — Telephone Encounter (Signed)
Pt's medication was sent to pt's pharmacy as requested. Confirmation received.  °

## 2021-08-01 ENCOUNTER — Other Ambulatory Visit: Payer: Self-pay

## 2021-08-19 ENCOUNTER — Ambulatory Visit: Payer: Medicare HMO | Admitting: Cardiovascular Disease

## 2021-08-19 ENCOUNTER — Encounter: Payer: Self-pay | Admitting: Cardiovascular Disease

## 2021-08-19 VITALS — BP 148/84 | HR 70 | Ht 69.0 in | Wt 216.2 lb

## 2021-08-19 DIAGNOSIS — E782 Mixed hyperlipidemia: Secondary | ICD-10-CM | POA: Diagnosis not present

## 2021-08-19 DIAGNOSIS — I2510 Atherosclerotic heart disease of native coronary artery without angina pectoris: Secondary | ICD-10-CM | POA: Diagnosis not present

## 2021-08-19 NOTE — Patient Instructions (Signed)
Medication Instructions:  Your physician recommends that you continue on your current medications as directed. Please refer to the Current Medication list given to you today.  *If you need a refill on your cardiac medications before your next appointment, please call your pharmacy*   Lab Work: Lipids, ALT, BMET Today If you have labs (blood work) drawn today and your tests are completely normal, you will receive your results only by: MyChart Message (if you have MyChart) OR A paper copy in the mail If you have any lab test that is abnormal or we need to change your treatment, we will call you to review the results.   Testing/Procedures: NONE   Follow-Up: At [facility], you and your health needs are our priority.  As part of our continuing mission to provide you with exceptional heart care, we have created designated Provider Care Teams.  These Care Teams include your primary Cardiologist (physician) and Advanced Practice Providers (APPs -  Physician Assistants and Nurse Practitioners) who all work together to provide you with the care you need, when you need it.  We recommend signing up for the patient portal called "MyChart".  Sign up information is provided on this After Visit Summary.  MyChart is used to connect with patients for Virtual Visits (Telemedicine).  Patients are able to view lab/test results, encounter notes, upcoming appointments, etc.  Non-urgent messages can be sent to your provider as well.   To learn more about what you can do with MyChart, go to [email or web address].    Your next appointment:   1 year(s)  The format for your next appointment:   In Person  Provider:  Nahser, Swinyer, [name]  :1}   Important Information About Sugar

## 2021-08-19 NOTE — Progress Notes (Signed)
Chris [name]  Date of Birth  1956/01/01   Problems: 1. Coronary artery disease- He presented in 2009 with some episodes of chest pain and was found to have an inferior wall myocardial infarction. He underwent successful PTCA and stenting of his mid  right coronary artery using a 2.75 x 28 mm Promus stent. The stent was post dilated using a 3.0 noncompliant balloon.  We then positioned a 3.0 x 15 mm Promus stent in the proximal right coronary artery.  The stent was post dilated using a 3.25 mm noncompliant balloon. 2. Hyperlipidemia 3. Sleep apnea 4. Hypertension     Chris [name] is a 66 year old gentleman. He has a history of coronary artery disease. He presented in 2009 with some episodes of chest pain and was found to have an inferior wall myocardial infarction. He underwent successful PTCA and stenting of his mid  right coronary artery using a 2.75 x 28 mm Promus stent. The stent was post dilated using a 3.0 noncompliant balloon.  We then positioned a 3.0 x 15 mm Promus stent in the proximal right coronary artery.  The stent was post dilated using a 3.25 mm noncompliant balloon.  Pt is doing well.  No angina or dyspnea.    He has not been sleeping well.  He also did not take his meds this am.  April 13, 2012: Chris [name] is doing well from a cardiac standpoint.  Exercising on the treadmill every other day. Complains of constipation.  October 17, 2012:  Thank started having some episodes of chest pain last week.  He had been doing lots of physical activity - played 36 holes of golf a day  for 2 days straight.  He also has been busy doing phyisical work.     He had a stress Myoview study. He walked for 11 minutes on a standard Bruce protocol triple test. A Myoview study revealed an old inferior wall myocardial infarction but was otherwise unremarkable.   June 14, 2013:  He is doing well .[name] Has some back pain and "crick" in his neck.  No CP .  Staying active, walking in the evenings 2.5 miles    Sept. 29, 2015:  Chris [name] is doing ok.  He is not working at [employer].   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  Doing well from a cardiac standpoint.  Still walking quite a bit.    Aug. 29, 2016:  Doing well. Owns his own business.  Chris [name]  No CP , no dyspnea.   Jan. 17, 2017: No angina  Is staying busy Working hard.   Oct. 9, 2017:   Chris [name] is seen today for follow up of his CAD Is healing up from a left ankle injury.  Walking on crutches .   Wears him out.  Has not had any chest pain  Or dyspnea  Aug. 14, 2018: Chris [name] is seen for follow up for his CAD Had emergent Appy surgery. Was found to have a ventral hernia at that time. Now needs to have hernia repair.  He had no complications with his emergent appendectomy. He's been doing well. No episodes of chest pain or short of breath.  Feb. 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   No CP or dyspnea Works out on th eElliptical  Primary MD added Chlorthaladone .   2 years ago , he had broke his left ankle and developed a DVT / pulmonary  embolus .    Sept.  11, 2019  Having some pain in right shoulder.  Slept on it wrong .   May 16, 2018: Seen today for follow-up visit regarding his coronary artery disease.  He also has a history of hyperlipidemia and diabetes mellitus. Last lipid levels were from December 01, 2017: Total cholesterol is 130.  LDL is 54.  His triglyceride level is 203.  Having some right shoulder issues.   Getting a MRI today  May need to have shoulder surgery  He is at low risk for CV complications during shoulder surgery.  Sept. 15, 2020  Chris [name] is seen today  Has a friend who is selling a [id number] tractor   Stays busy. No CP .  Walks 4 miles a day .   May , 12, 2021  Doing well.   No CP  Did lots of work yesterday - removed his sidewalk yesterday Walks 4 miles twice a day  Has not had covid vaccines yet .    Aug 01, 2020: Chris [name] is doing welll. Hx of CAD HLD. Out mowing [employer] field behind his house  No CP ,  Walks 4 miles a day .   [place]    Aug 19, 2021 Chris [name] is doing well. Hx of CAD  HLD   Doing well.    No CP  Getting some exercise  Has some left foot swelling  after foot surgery .  Has developed some peripheral neuropathy    Current Outpatient Medications on File Prior to Visit  Medication Sig Dispense Refill   allopurinol (ZYLOPRIM) 300 MG tablet Take 300 mg by mouth daily.     aspirin 81 MG tablet Take 81 mg by mouth daily.     atorvastatin (LIPITOR) 40 MG tablet TAKE 1 TABLET (40 MG TOTAL) BY MOUTH DAILY. PLEASE MAKE YEARLY APPT WITH DR. Acie Fredrickson FOR MAY 2022 BEFORE ANYMORE REFILLS. THANK YOU 1ST ATTEMPT 90 tablet 3   chlorthalidone (HYGROTON) 25 MG tablet TAKE 1 TABLET (25 MG TOTAL) BY MOUTH DAILY. 30 tablet 0   clopidogrel (PLAVIX) 75 MG tablet TAKE 1 TABLET BY MOUTH EVERY DAY 90 tablet 2   fenofibrate micronized (LOFIBRA) 134 MG capsule Take 134 mg by mouth daily.     fluticasone (FLONASE) 50 MCG/ACT nasal spray PLACE 1 SPRAY INTO BOTH NOSTRILS 2 (TWO) TIMES DAILY 48 mL 2   gabapentin (NEURONTIN) 300 MG capsule Take 1-2 capsules (300-600 mg total) by mouth at bedtime. 180 capsule 1   metFORMIN (GLUCOPHAGE) 1000 MG tablet Take 1 tablet by mouth 1 day or 1 dose.     metoprolol succinate (TOPROL-XL) 25 MG 24 hr tablet Take 0.5 tablets (12.5 mg total) by mouth 2 (two) times daily. 90 tablet 0   multivitamin-iron-minerals-folic acid (CENTRUM) chewable tablet Chew 1 tablet by mouth daily.     Omega-3 Fatty Acids (FISH OIL) 1200 MG CAPS Take 2 capsules by mouth 2 (two) times daily.     pantoprazole (PROTONIX) 40 MG tablet TAKE 1 TABLET BY MOUTH EVERY DAY. Please make yearly appt with Dr. Acie Fredrickson for May 2023 for future refills. Thank you 1st attempt 90 tablet 0   tadalafil (CIALIS) 10 MG tablet Take 1 tablet (10 mg total) by mouth every other  day as needed for erectile dysfunction. 10 tablet 1   nitroGLYCERIN (NITROSTAT) 0.4 MG SL tablet Place 1 tablet (0.4 mg total) under the tongue every 5 (five) minutes as needed for chest pain. (  Patient not taking: Reported on 08/19/2021) 100 tablet 1   No current facility-administered medications on file prior to visit.    Allergies  Allergen Reactions   Ace Inhibitors Rash   Ropinirole Hcl Rash    Past Medical History:  Diagnosis Date   Acute MI, inferior wall, initial episode of care [facility]) 2009   Allergy    Anemia    Arthritis    Coronary atherosclerosis of unspecified type of vessel, native or graft    GERD (gastroesophageal reflux disease)    High cholesterol    Hypertension    Seasonal allergies    Sleep apnea    "I've never worn a mask" (09/17/2016)    Past Surgical History:  Procedure Laterality Date   APPENDECTOMY     CHOLECYSTECTOMY OPEN  ~ 2008   "it exploded"   CORONARY ANGIOPLASTY WITH STENT PLACEMENT  2009    2.75 x 28 mm Promus stent to the RCA   [place] N/A 11/30/2016   Procedure: INSERTION OF MESH;  Surgeon: Judeth Horn, MD;  Location: [place];  Service: General;  Laterality: N/A;   LAPAROSCOPIC APPENDECTOMY N/A 09/16/2016   Procedure: APPENDECTOMY LAPAROSCOPIC;  Surgeon: Judeth Horn, MD;  Location: [place];  Service: General;  Laterality: N/A;   LAPAROSCOPIC INCISIONAL / UMBILICAL / [place]  11/30/2016   w/repair serosal tear   ORIF ANKLE FRACTURE Left 11/27/2015   Procedure: OPEN REDUCTION INTERNAL FIXATION (ORIF) ANKLE FRACTURE;  Surgeon: Frederik Pear, MD;  Location: [place];  Service: Orthopedics;  Laterality: Left;   VENTRAL HERNIA REPAIR N/A 11/30/2016   Procedure: LAPAROSCOPIC VENTRAL HERNIA REPAIR AND  REPAIR OF SEROSAL TEAR;  Surgeon: Judeth Horn, MD;  Location: [place];  Service: General;  Laterality: N/A;    Social History   Tobacco Use  Smoking Status Former    Packs/day: 2.50   Years: 36.00   Pack years: 90.00   Types: Cigarettes   Quit date: 08/22/2007   Years since quitting: 14.0  Smokeless Tobacco Never    Social History   Substance and Sexual Activity  Alcohol Use Yes   Alcohol/week: 6.0 standard drinks   Types: 6 Cans of beer per week    Family History  Problem Relation Age of Onset   Heart disease Mother    Hypertension Father    Prostate cancer Brother    CAD Brother     Reviw of Systems:  Reviewed in the HPI.  All other systems are negative.     EKG:    Aug 19, 2021: Normal sinus rhythm at 70.  Incomplete right bundle branch block.  Previous inferior wall myocardial infarction    Assessment / Plan:   .1. Coronary artery disease-    Hx of inferior MI in 2009 . S/p stenting .  Seems to be doing well.  Continue current medications.  He denies having any angina.  2. Hyperlipidemia-   lipids were well controlled at his last visit. Check lipids today     3. Sleep apnea- per primary MD   4. Hypertension -    BP is well controlled.   5.  ED :            Mertie Moores, MD  08/19/2021 3:13 PM    [facility] [address],  [address] [place], [place]  [address] Pager [phone] Phone: [phone]; Fax: [phone]     

## 2021-08-20 LAB — BASIC METABOLIC PANEL
BUN/Creatinine Ratio: 11 (ref 10–24)
BUN: 11 mg/dL (ref 8–27)
CO2: 24 mmol/L (ref 20–29)
Calcium: 10 mg/dL (ref 8.6–10.2)
Chloride: 100 mmol/L (ref 96–106)
Creatinine, Ser: 1.02 mg/dL (ref 0.76–1.27)
Glucose: 96 mg/dL (ref 70–99)
Potassium: 4.1 mmol/L (ref 3.5–5.2)
Sodium: 140 mmol/L (ref 134–144)
eGFR: 82 mL/min/{1.73_m2} (ref >59)

## 2021-08-20 LAB — LIPID PANEL
Chol/HDL Ratio: 3.6 ratio (ref 0.0–5.0)
Cholesterol, Total: 125 mg/dL (ref 100–199)
HDL: 35 mg/dL — ABNORMAL LOW (ref >39)
LDL Chol Calc (NIH): 63 mg/dL (ref 0–99)
Triglycerides: 154 mg/dL — ABNORMAL HIGH (ref 0–149)
VLDL Cholesterol Cal: 27 mg/dL (ref 5–40)

## 2021-08-20 LAB — ALT: ALT: 52 IU/L — ABNORMAL HIGH (ref 0–44)

## 2021-09-11 ENCOUNTER — Other Ambulatory Visit: Payer: Self-pay | Admitting: Cardiovascular Disease

## 2021-09-11 DIAGNOSIS — R12 Heartburn: Secondary | ICD-10-CM

## 2021-09-13 ENCOUNTER — Other Ambulatory Visit: Payer: Self-pay | Admitting: Cardiovascular Disease

## 2021-09-30 ENCOUNTER — Other Ambulatory Visit: Payer: Self-pay | Admitting: Cardiovascular Disease

## 2021-11-04 ENCOUNTER — Ambulatory Visit (INDEPENDENT_AMBULATORY_CARE_PROVIDER_SITE_OTHER): Payer: Medicare HMO | Admitting: Family Medicine

## 2021-11-04 ENCOUNTER — Encounter: Payer: Self-pay | Admitting: Family Medicine

## 2021-11-04 VITALS — BP 128/84 | HR 72 | Temp 97.7°F | Wt 218.6 lb

## 2021-11-04 DIAGNOSIS — R399 Unspecified symptoms and signs involving the genitourinary system: Secondary | ICD-10-CM

## 2021-11-04 DIAGNOSIS — R350 Frequency of micturition: Secondary | ICD-10-CM | POA: Diagnosis not present

## 2021-11-04 LAB — POCT URINALYSIS DIPSTICK
Bilirubin, UA: NEGATIVE
Blood, UA: NEGATIVE
Glucose, UA: NEGATIVE
Ketones, UA: NEGATIVE
Leukocytes, UA: NEGATIVE
Nitrite, UA: NEGATIVE
Protein, UA: NEGATIVE
Spec Grav, UA: 1.015 (ref 1.010–1.025)
Urobilinogen, UA: 0.2 E.U./dL
pH, UA: 7 (ref 5.0–8.0)

## 2021-11-04 MED ORDER — TAMSULOSIN HCL 0.4 MG PO CAPS: 0.4000 mg | ORAL_CAPSULE | Freq: Every day | ORAL | 3 refills | Status: DC

## 2021-11-04 NOTE — Patient Instructions (Signed)
We are checking urine for infection. We are sending blood to rule out prostate cancer. Keep a journal of urinary symptoms. Try flomax for symptom relief.

## 2021-11-04 NOTE — Progress Notes (Unsigned)
Assessment/Plan:   Problem List Items Addressed This Visit   None Visit Diagnoses     UTI symptoms    -  Primary   Relevant Orders   POCT Urinalysis Dipstick (Completed)   Urine Culture          Subjective:  HPI:  Chris [name] is a 66 y.o. male who has Hyperlipidemia; Obstructive sleep apnea; Coronary artery disease; Restless leg syndrome; Erectile dysfunction; History of MI (myocardial infarction); Ventral hernia without obstruction or gangrene; GERD (gastroesophageal reflux disease); Right-sided epistaxis; Type 2 diabetes mellitus without complication, without long-term current use of insulin (HCC); Plantar fasciitis; Diabetic peripheral neuropathy (HCC); Diabetic retinopathy (HCC); Allergic rhinitis; Hx of left ankle fracture- s/p ORIF; and History of pulmonary embolus (PE) on their problem list..   He  has a past medical history of Acute MI, inferior wall, initial episode of care [facility]) (2009), Allergy, Anemia, Arthritis, Coronary atherosclerosis of unspecified type of vessel, native or graft, GERD (gastroesophageal reflux disease), High cholesterol, Hypertension, Seasonal allergies, and Sleep apnea.[name]   He presents with chief complaint of Urinary Retention (Trouble urinating and lower back painX 1 week. Left testicle pain and upper left leg pain) .   Urinary symptoms  He reports new onset urinary hesitancy. The current episode started  3 weeks ago and is staying constant. Patient states symptoms are moderate in intensity, occurring  5 times a day. He  has not been recently treated for similar symptoms. Marland Kitchenbph   Associated symptoms: No abdominal pain No back pain  No chills No constipation  No cramping No diarrhea  No discharge No fever  No hematuria No nausea  No vomiting    --------------------------------------------------------------------------------------- Has nocturia if drinks beer, but not when avoiding.     Past Surgical History:  Procedure Laterality Date    APPENDECTOMY     CHOLECYSTECTOMY OPEN  ~ 2008   "it exploded"   CORONARY ANGIOPLASTY WITH STENT PLACEMENT  2009    2.75 x 28 mm Promus stent to the RCA   FRACTURE SURGERY     HERNIA REPAIR     INSERTION OF MESH N/A 11/30/2016   Procedure: INSERTION OF MESH;  Surgeon: [name], MD;  [facility];  Service: General;  Laterality: N/A;   LAPAROSCOPIC APPENDECTOMY N/A 09/16/2016   Procedure: APPENDECTOMY LAPAROSCOPIC;  Surgeon: [name], MD;  Location: [facility];  Service: General;  Laterality: N/A;   LAPAROSCOPIC INCISIONAL / UMBILICAL / VENTRAL HERNIA REPAIR  11/30/2016   w/repair serosal tear   ORIF ANKLE FRACTURE Left 11/27/2015   Procedure: OPEN REDUCTION INTERNAL FIXATION (ORIF) ANKLE FRACTURE;  Surgeon: [name], MD;  Location: [facility];  Service: Orthopedics;  Laterality: Left;   VENTRAL HERNIA REPAIR N/A 11/30/2016   Procedure: LAPAROSCOPIC VENTRAL HERNIA REPAIR AND  REPAIR OF SEROSAL TEAR;  Surgeon: [name], MD;  Location: [facility];  Service: General;  Laterality: N/A;    Outpatient Medications Prior to Visit  Medication Sig Dispense Refill   allopurinol (ZYLOPRIM) 300 MG tablet Take 300 mg by mouth daily.     aspirin 81 MG tablet Take 81 mg by mouth daily.     atorvastatin (LIPITOR) 40 MG tablet TAKE 1 TABLET BY MOUTH EVERY DAY 90 tablet 3   chlorthalidone (HYGROTON) 25 MG tablet TAKE 1 TABLET (25 MG TOTAL) BY MOUTH DAILY. 30 tablet 0   clopidogrel (PLAVIX) 75 MG tablet TAKE 1 TABLET BY MOUTH EVERY DAY 90 tablet 2   fenofibrate micronized (LOFIBRA)  134 MG capsule Take 134 mg by mouth daily.     fluticasone (FLONASE) 50 MCG/ACT nasal spray PLACE 1 SPRAY INTO BOTH NOSTRILS 2 (TWO) TIMES DAILY 48 mL 2   gabapentin (NEURONTIN) 300 MG capsule Take 1-2 capsules (300-600 mg total) by mouth at bedtime. 180 capsule 1   metFORMIN (GLUCOPHAGE) 1000 MG tablet Take 1 tablet by mouth 1 day or 1 dose.     metoprolol succinate (TOPROL-XL) 25 MG 24 hr tablet TAKE 0.5  TABLETS BY MOUTH 2 TIMES DAILY. 90 tablet 3   multivitamin-iron-minerals-folic acid (CENTRUM) chewable tablet Chew 1 tablet by mouth daily.     nitroGLYCERIN (NITROSTAT) 0.4 MG SL tablet Place 1 tablet (0.4 mg total) under the tongue every 5 (five) minutes as needed for chest pain. 100 tablet 1   Omega-3 Fatty Acids (FISH OIL) 1200 MG CAPS Take 2 capsules by mouth 2 (two) times daily.     pantoprazole (PROTONIX) 40 MG tablet TAKE 1 TABLET BY MOUTH EVERY DAY 90 tablet 3   tadalafil (CIALIS) 10 MG tablet Take 1 tablet (10 mg total) by mouth every other day as needed for erectile dysfunction. 10 tablet 1   No facility-administered medications prior to visit.    Family History  Problem Relation Age of Onset   Heart disease Mother    Hypertension Father    Prostate cancer Brother    CAD Brother     Social History   Socioeconomic History   Marital status: Married    Spouse name: Not on file   Number of children: Not on file   Years of education: Not on file   Highest education level: Not on file  Occupational History   Occupation: Production designer, theatre/television/film - [employer]  Tobacco Use   Smoking status: Former    Packs/day: 2.50    Years: 36.00    Total pack years: 90.00    Types: Cigarettes    Quit date: 08/22/2007    Years since quitting: 14.2   Smokeless tobacco: Never  Vaping Use   Vaping Use: Never used  Substance and Sexual Activity   Alcohol use: Yes    Alcohol/week: 6.0 standard drinks of alcohol    Types: 6 Cans of beer per week   Drug use: Never    Types: Amphetamines   Sexual activity: Yes  Other Topics Concern   Not on file  Social History Narrative   Lives with wife. Exercises occasionally.   Social Determinants of Health   Financial Resource Strain: Not on file  Food Insecurity: Not on file  Transportation Needs: Not on file  Physical Activity: Not on file  Stress: Not on file  Social Connections: Not on file  Intimate Partner Violence: Not on file                                                                                                  Objective:  Physical Exam: BP 128/84 (BP Location: Left Arm, Patient Position: Sitting, Cuff Size: Large)   Pulse 72   Temp 97.7 F (36.5 C) (Temporal)   Wt 218 lb  9.6 oz (99.2 kg)   SpO2 98%   BMI 32.28 kg/m    General: No acute distress. Awake and conversant.  Eyes: Normal conjunctiva, anicteric. Round symmetric pupils.  ENT: Hearing grossly intact. No nasal discharge.  Neck: Neck is supple. No masses or thyromegaly.  Respiratory: Respirations are non-labored. No auditory wheezing.  Skin: Warm. No rashes or ulcers.  Psych: Alert and oriented. Cooperative, Appropriate mood and affect, Normal judgment.  CV: No cyanosis or JVD MSK: Normal ambulation. No clubbing  Neuro: Sensation and CN II-XII grossly normal.        Garner Nash, MD, MS

## 2021-11-06 ENCOUNTER — Other Ambulatory Visit: Payer: Self-pay | Admitting: Family Medicine

## 2021-11-06 DIAGNOSIS — N50812 Left testicular pain: Secondary | ICD-10-CM

## 2021-11-06 DIAGNOSIS — R399 Unspecified symptoms and signs involving the genitourinary system: Secondary | ICD-10-CM | POA: Insufficient documentation

## 2021-11-06 DIAGNOSIS — R350 Frequency of micturition: Secondary | ICD-10-CM | POA: Insufficient documentation

## 2021-11-06 LAB — URINE CULTURE
MICRO NUMBER:: 13781696
Result:: NO GROWTH
SPECIMEN QUALITY:: ADEQUATE

## 2021-11-06 LAB — PSA, TOTAL AND FREE
PSA, % Free: 50 % (calc) (ref >25)
PSA, Free: 0.4 ng/mL
PSA, Total: 0.8 ng/mL (ref <=4.0)

## 2021-11-06 NOTE — Assessment & Plan Note (Signed)
Urinary frequency symptoms without dysuria, concern for possible urinary retention versus BPH Given age and sex, BPH most likely UA negative for infection, urine culture sent We will obtain PSA to assess for possible prostate cancer Trial Flomax Return precautions discussed

## 2021-11-10 ENCOUNTER — Telehealth: Payer: Self-pay | Admitting: Family Medicine

## 2021-11-10 ENCOUNTER — Ambulatory Visit: Payer: Medicare HMO | Admitting: Family Medicine

## 2021-11-10 NOTE — Telephone Encounter (Signed)
8.21.23 no show letter sent 

## 2021-11-10 NOTE — Telephone Encounter (Signed)
1st no show, fee waived, letter sent 

## 2021-11-11 ENCOUNTER — Ambulatory Visit (HOSPITAL_COMMUNITY)
Admission: RE | Admit: 2021-11-11 | Discharge: 2021-11-11 | Disposition: A | Discharge status: Home / Self Care | Payer: Medicare HMO | Source: Ambulatory Visit | Attending: Family Medicine | Admitting: Family Medicine

## 2021-11-11 DIAGNOSIS — N50812 Left testicular pain: Secondary | ICD-10-CM | POA: Diagnosis present

## 2021-11-13 ENCOUNTER — Telehealth: Payer: Self-pay | Admitting: Family Medicine

## 2021-11-13 NOTE — Telephone Encounter (Signed)
Pt is wanting a call back concerning his US Scrotum (Accession [id number]) (Order [id number]) results @ [phone]

## 2021-11-13 NOTE — Telephone Encounter (Signed)
Contacted patient and provided results. Patient verbalized understanding.   

## 2021-12-04 ENCOUNTER — Telehealth: Payer: Self-pay | Admitting: Family Medicine

## 2021-12-04 ENCOUNTER — Ambulatory Visit: Payer: Medicare HMO | Admitting: Family Medicine

## 2021-12-04 ENCOUNTER — Encounter: Payer: Self-pay | Admitting: Family Medicine

## 2021-12-04 NOTE — Telephone Encounter (Signed)
Pt was a no show for an OV with Dr. Veto Kemps 12/04/2021. This is his 2nd, letter has been sent out

## 2021-12-04 NOTE — Telephone Encounter (Signed)
2nd no show, fee generated, final warning letter sent 

## 2021-12-10 ENCOUNTER — Encounter: Payer: Self-pay | Admitting: Family Medicine

## 2021-12-10 ENCOUNTER — Ambulatory Visit (INDEPENDENT_AMBULATORY_CARE_PROVIDER_SITE_OTHER): Payer: Medicare HMO | Admitting: Family Medicine

## 2021-12-10 VITALS — BP 132/84 | HR 62 | Temp 97.4°F | Ht 69.0 in | Wt 220.2 lb

## 2021-12-10 DIAGNOSIS — Z23 Encounter for immunization: Secondary | ICD-10-CM | POA: Diagnosis not present

## 2021-12-10 DIAGNOSIS — I2510 Atherosclerotic heart disease of native coronary artery without angina pectoris: Secondary | ICD-10-CM | POA: Diagnosis not present

## 2021-12-10 DIAGNOSIS — E119 Type 2 diabetes mellitus without complications: Secondary | ICD-10-CM | POA: Diagnosis not present

## 2021-12-10 DIAGNOSIS — N521 Erectile dysfunction due to diseases classified elsewhere: Secondary | ICD-10-CM

## 2021-12-10 DIAGNOSIS — R399 Unspecified symptoms and signs involving the genitourinary system: Secondary | ICD-10-CM

## 2021-12-10 LAB — GLUCOSE, RANDOM: Glucose, Bld: 129 mg/dL — ABNORMAL HIGH (ref 70–99)

## 2021-12-10 LAB — MICROALBUMIN / CREATININE URINE RATIO
Creatinine,U: 120.6 mg/dL
Microalb Creat Ratio: 0.6 mg/g (ref 0.0–30.0)
Microalb, Ur: 0.7 mg/dL (ref 0.0–1.9)

## 2021-12-10 LAB — HEMOGLOBIN A1C: Hgb A1c MFr Bld: 7.4 % — ABNORMAL HIGH (ref 4.6–6.5)

## 2021-12-10 NOTE — Progress Notes (Signed)
[facility] [address] [place] [address] Dept: [phone] Dept Fax: [phone]  Chronic Care Office Visit  Subjective:    Patient ID: Chris [name], male    DOB: 1955-11-10, 66 y.o..   MRN: [id number]  Chief Complaint  Patient presents with   Follow-up    1 month f/u.  No concerns.  Declines flu.     History of Present Illness:  Patient is in today for reassessment of chronic medical issues.  Chris [name] returns today for reassessment of an issue he has ahd with urinary hesitancy/straining. he was seen by Dr. Janee Morn about a month ago. He was felt to likely have some BPH and was started on tamsulosin. He notes that this has helped and his symptoms have resolved.  Chris [name] has a history of Type 2 diabetes complicated with neuropathy and retinopathy. He is managed on metformin 1000 mg daily. He has not been in for care in 17 months. He has not seen an eye doctor in the past year. His neuropathy is managed with gabapentin 300 mg 1-2 at bedtime. He notes his numbness in his feet is not as severe as int he past.    Chris [name] has a past history of a myocardial infarction and is s/p placement of 2 cardiac stents. He is on an aspirin, Plavix 75 mg daily, metoprolol 25 mg daily, and has NTG available. He also has hyperlipidemia and is treated with atorvastatin 40 mg daily, fenofibrate 134 mg daily, and fish oil.  Chris [name] has a history of erectile dysfunction. He has been managed with Viagra and Cialis, but feels neither is really working for him anymore.  Past Medical History: Patient Active Problem List   Diagnosis Date Noted   Lower urinary tract symptoms (LUTS) 11/06/2021   Diabetic peripheral neuropathy (HCC) 06/28/2020   Diabetic retinopathy (HCC) 06/28/2020   Allergic rhinitis 06/28/2020   Hx of left ankle fracture- s/p ORIF 06/28/2020   History of pulmonary embolus (PE) 06/28/2020   Type 2 diabetes  mellitus without complication, without long-term current use of insulin (HCC) 10/05/2018   Plantar fasciitis 10/05/2018   Right-sided epistaxis 12/16/2016   GERD (gastroesophageal reflux disease) 12/05/2016   Ventral hernia without obstruction or gangrene 11/30/2016   History of MI (myocardial infarction) 05/21/2014   Erectile dysfunction 07/25/2012   Restless leg syndrome 02/20/2010   Hyperlipidemia 02/19/2010   Obstructive sleep apnea 02/19/2010   Coronary artery disease 02/19/2010   Past Surgical History:  Procedure Laterality Date   APPENDECTOMY     CHOLECYSTECTOMY OPEN  ~ 2008   "it exploded"   CORONARY ANGIOPLASTY WITH STENT PLACEMENT  2009    2.75 x 28 mm Promus stent to the RCA   FRACTURE SURGERY     HERNIA REPAIR     INSERTION OF MESH N/A 11/30/2016   Procedure: INSERTION OF MESH;  Surgeon: [name], MD;  [facility];  Service: General;  Laterality: N/A;   LAPAROSCOPIC APPENDECTOMY N/A 09/16/2016   Procedure: APPENDECTOMY LAPAROSCOPIC;  Surgeon: [name], MD;  Location: [facility] OR;  Service: General;  Laterality: N/A;   LAPAROSCOPIC INCISIONAL / UMBILICAL / VENTRAL HERNIA REPAIR  11/30/2016   w/repair serosal tear   ORIF ANKLE FRACTURE Left 11/27/2015   Procedure: OPEN REDUCTION INTERNAL FIXATION (ORIF) ANKLE FRACTURE;  Surgeon: [name], MD;  Location: [facility];  Service: Orthopedics;  Laterality: Left;   VENTRAL HERNIA REPAIR N/A 11/30/2016   Procedure: LAPAROSCOPIC VENTRAL  HERNIA REPAIR AND  REPAIR OF SEROSAL TEAR;  Surgeon: [name], MD;  Location: [facility];  Service: General;  Laterality: N/A;   Family History  Problem Relation Age of Onset   Heart disease Mother    Hypertension Father    Prostate cancer Brother    CAD Brother    Outpatient Medications Prior to Visit  Medication Sig Dispense Refill   allopurinol (ZYLOPRIM) 300 MG tablet Take 300 mg by mouth daily.     aspirin 81 MG tablet Take 81 mg by mouth daily.     atorvastatin  (LIPITOR) 40 MG tablet TAKE 1 TABLET BY MOUTH EVERY DAY 90 tablet 3   chlorthalidone (HYGROTON) 25 MG tablet TAKE 1 TABLET (25 MG TOTAL) BY MOUTH DAILY. 30 tablet 0   clopidogrel (PLAVIX) 75 MG tablet TAKE 1 TABLET BY MOUTH EVERY DAY 90 tablet 2   fenofibrate micronized (LOFIBRA) 134 MG capsule Take 134 mg by mouth daily.     fluticasone (FLONASE) 50 MCG/ACT nasal spray PLACE 1 SPRAY INTO BOTH NOSTRILS 2 (TWO) TIMES DAILY 48 mL 2   gabapentin (NEURONTIN) 300 MG capsule Take 1-2 capsules (300-600 mg total) by mouth at bedtime. 180 capsule 1   metFORMIN (GLUCOPHAGE) 1000 MG tablet Take 1 tablet by mouth 1 day or 1 dose.     metoprolol succinate (TOPROL-XL) 25 MG 24 hr tablet TAKE 0.5 TABLETS BY MOUTH 2 TIMES DAILY. 90 tablet 3   multivitamin-iron-minerals-folic acid (CENTRUM) chewable tablet Chew 1 tablet by mouth daily.     nitroGLYCERIN (NITROSTAT) 0.4 MG SL tablet Place 1 tablet (0.4 mg total) under the tongue every 5 (five) minutes as needed for chest pain. 100 tablet 1   Omega-3 Fatty Acids (FISH OIL) 1200 MG CAPS Take 2 capsules by mouth 2 (two) times daily.     pantoprazole (PROTONIX) 40 MG tablet TAKE 1 TABLET BY MOUTH EVERY DAY 90 tablet 3   tadalafil (CIALIS) 10 MG tablet Take 1 tablet (10 mg total) by mouth every other day as needed for erectile dysfunction. 10 tablet 1   tamsulosin (FLOMAX) 0.4 MG CAPS capsule Take 1 capsule (0.4 mg total) by mouth daily. 30 capsule 3   No facility-administered medications prior to visit.   Allergies  Allergen Reactions   Ace Inhibitors Rash   Ropinirole Hcl Rash    Objective:   Today's Vitals   12/10/21 0824  BP: 132/84  Pulse: 62  Temp: (!) 97.4 F (36.3 C)  TempSrc: Temporal  SpO2: 98%  Weight: 220 lb 3.2 oz (99.9 kg)  Height: 5\' 9"  (1.753 m)   Body mass index is 32.52 kg/m.   General: Well developed, well nourished. No acute distress. Feet- Skin intact. No sign of maceration between toes. Nails are normal. Dorsalis pedis and  posterior tibial artery pulses are normal. 5.07 monofilament testing normal.  Psych: Alert and oriented. Normal mood and affect.  Health Maintenance Due  Topic Date Due   Zoster Vaccines- Shingrix (1 of 2) Never done   Pneumonia Vaccine 75+ Years old (2 - PPSV23 or PCV20) 09/07/2020   HEMOGLOBIN A1C  01/01/2021   OPHTHALMOLOGY EXAM  01/21/2021   FOOT EXAM  06/28/2021   Diabetic kidney evaluation - Urine ACR  07/02/2021   Lab Results Component Ref Range & Units 1 mo ago  PSA, Total < OR = 4.0 ng/mL 0.8   PSA, Free ng/mL 0.4   PSA, % Free >25 % (calc) 50    Component 1 mo ago  MICRO NUMBER: [id number]   SPECIMEN QUALITY: Adequate   Sample Source URINE, CLEAN CATCH   STATUS: FINAL   Result: No Growth       Latest Ref Rng & Units 08/19/2021    3:30 PM 07/02/2020    8:07 AM 08/02/2019    8:37 AM  BMP  Glucose 70 - 99 mg/dL 96  106  121   BUN 8 - 27 mg/dL 11  15  14    Creatinine 0.76 - 1.27 mg/dL 1.02  1.02  0.95   BUN/Creat Ratio 10 - 24 11   15    Sodium 134 - 144 mmol/L 140  140  137   Potassium 3.5 - 5.2 mmol/L 4.1  4.3  3.7   Chloride 96 - 106 mmol/L 100  101  97   CO2 20 - 29 mmol/L 24  30  27    Calcium 8.6 - 10.2 mg/dL 10.0  10.0  9.8    Lab Results  Component Value Date   CHOL 125 08/19/2021   HDL 35 (L) 08/19/2021   LDLCALC 63 08/19/2021   TRIG 154 (H) 08/19/2021   CHOLHDL 3.6 08/19/2021   Assessment & Plan:   1. Lower urinary tract symptoms (LUTS) Improved on Flomax 0.4 mg daily.  2. Type 2 diabetes mellitus without complication, without long-term current use of insulin ([place]) Overdue for micral and Aic. Plan to continue metformin 1,000 mg daily.  - Microalbumin / creatinine urine ratio - Hemoglobin A1c - Glucose, random  3. Coronary artery disease involving native coronary artery of native heart without angina pectoris Stable at present. Continue aspirin 81 mg daily, Plavix, 75 mg daily, and metoprolol 25 mg daily.  4. Erectile dysfunction due to  diseases classified elsewhere As Mr. Boney has failed PDE-5 Inhibitors, I will refer him to urology for an assessment and to discuss further options. This may be multifactorial related to cardiovascular disease and diabetes.  - Ambulatory referral to Urology   Return in about 3 months (around 03/11/2022) for Reassessment.   Haydee Salter, MD

## 2022-01-30 ENCOUNTER — Other Ambulatory Visit: Payer: Self-pay | Admitting: Family Medicine

## 2022-01-30 DIAGNOSIS — R350 Frequency of micturition: Secondary | ICD-10-CM

## 2022-01-30 MED ORDER — TAMSULOSIN HCL 0.4 MG PO CAPS: 0.4000 mg | ORAL_CAPSULE | Freq: Every day | ORAL | 3 refills | Status: DC

## 2022-03-10 ENCOUNTER — Ambulatory Visit: Payer: Medicare HMO | Admitting: Family Medicine

## 2022-03-11 ENCOUNTER — Ambulatory Visit: Payer: Medicare HMO | Admitting: Family Medicine

## 2022-03-11 ENCOUNTER — Telehealth: Payer: Self-pay | Admitting: Family Medicine

## 2022-03-11 NOTE — Telephone Encounter (Signed)
PT was a no show 12/20 for an OV with Dr. Veto Kemps. This is his third

## 2022-03-17 ENCOUNTER — Ambulatory Visit (INDEPENDENT_AMBULATORY_CARE_PROVIDER_SITE_OTHER): Payer: Medicare HMO | Admitting: Family Medicine

## 2022-03-17 ENCOUNTER — Encounter: Payer: Self-pay | Admitting: Family Medicine

## 2022-03-17 VITALS — BP 132/80 | HR 79 | Temp 97.8°F | Ht 69.0 in | Wt 221.2 lb

## 2022-03-17 DIAGNOSIS — I25118 Atherosclerotic heart disease of native coronary artery with other forms of angina pectoris: Secondary | ICD-10-CM

## 2022-03-17 DIAGNOSIS — E119 Type 2 diabetes mellitus without complications: Secondary | ICD-10-CM | POA: Diagnosis not present

## 2022-03-17 DIAGNOSIS — J101 Influenza due to other identified influenza virus with other respiratory manifestations: Secondary | ICD-10-CM

## 2022-03-17 LAB — POCT INFLUENZA A/B
Influenza A, POC: POSITIVE — AB
Influenza B, POC: NEGATIVE

## 2022-03-17 LAB — GLUCOSE, RANDOM: Glucose, Bld: 136 mg/dL — ABNORMAL HIGH (ref 70–99)

## 2022-03-17 LAB — HEMOGLOBIN A1C: Hgb A1c MFr Bld: 7.6 % — ABNORMAL HIGH (ref 4.6–6.5)

## 2022-03-17 LAB — POC COVID19 BINAXNOW: SARS Coronavirus 2 Ag: NEGATIVE

## 2022-03-17 MED ORDER — NITROGLYCERIN 0.4 MG SL SUBL: 0.4000 mg | SUBLINGUAL_TABLET | SUBLINGUAL | 1 refills | Status: AC | PRN

## 2022-03-17 MED ORDER — OSELTAMIVIR PHOSPHATE 75 MG PO CAPS: 75.0000 mg | ORAL_CAPSULE | Freq: Two times a day (BID) | ORAL | 0 refills | Status: DC

## 2022-03-17 NOTE — Progress Notes (Signed)
[facility]ARE [facility] [address] [place] [address] Dept: [phone] Dept Fax: [phone]  Office Visit  Subjective:    Patient ID: Chris [name], male    DOB: [birth year], 66 y.o..   MRN: [id number]  Chief Complaint  Patient presents with   Acute Visit    C/o having     History of Present Illness:  Patient is in today for complaining of a 2-3 day history of cough, rhinorrhea, chest congestion, headache and ear congestion. he thinks he may have had some fever. He has had multiple family members recently test positive for influenza. He is using a Tylenol Cold & Sinus medicine.  Chris [name] has a history of Type 2 diabetes complicated with neuropathy and retinopathy. He is managed on metformin 1000 mg daily.   Past Medical History: Patient Active Problem List   Diagnosis Date Noted   Lower urinary tract symptoms (LUTS) 11/06/2021   Diabetic peripheral neuropathy (HCC) 06/28/2020   Diabetic retinopathy (HCC) 06/28/2020   Allergic rhinitis 06/28/2020   Hx of left ankle fracture- s/p ORIF 06/28/2020   History of pulmonary embolus (PE) 06/28/2020   Type 2 diabetes mellitus without complication, without long-term current use of insulin (HCC) 10/05/2018   Plantar fasciitis 10/05/2018   Right-sided epistaxis 12/16/2016   GERD (gastroesophageal reflux disease) 12/05/2016   Ventral hernia without obstruction or gangrene 11/30/2016   History of MI (myocardial infarction) 05/21/2014   Erectile dysfunction 07/25/2012   Restless leg syndrome 02/20/2010   Hyperlipidemia 02/19/2010   Obstructive sleep apnea 02/19/2010   Coronary artery disease 02/19/2010   Past Surgical History:  Procedure Laterality Date   APPENDECTOMY     CHOLECYSTECTOMY OPEN  ~ 2008   "it exploded"   CORONARY ANGIOPLASTY WITH STENT PLACEMENT  2009    2.75 x 28 mm Promus stent to the RCA   FRACTURE SURGERY     HERNIA REPAIR     INSERTION OF MESH N/A  11/30/2016   Procedure: INSERTION OF MESH;  Surgeon: [name], MD;  [facility];  Service: General;  Laterality: N/A;   LAPAROSCOPIC APPENDECTOMY N/A 09/16/2016   Procedure: APPENDECTOMY LAPAROSCOPIC;  Surgeon: [name], MD;  [facility];  Service: General;  Laterality: N/A;   LAPAROSCOPIC INCISIONAL / UMBILICAL / VENTRAL HERNIA REPAIR  11/30/2016   w/repair serosal tear   ORIF ANKLE FRACTURE Left 11/27/2015   Procedure: OPEN REDUCTION INTERNAL FIXATION (ORIF) ANKLE FRACTURE;  Surgeon: [name], MD;  Location:  SURGERY CENTER;  Service: Orthopedics;  Laterality: Left;   VENTRAL HERNIA REPAIR N/A 11/30/2016   Procedure: LAPAROSCOPIC VENTRAL HERNIA REPAIR AND  REPAIR OF SEROSAL TEAR;  Surgeon: [name], MD;  Location[facility];  Service: General;  Laterality: N/A;   Family History  Problem Relation Age of Onset   Heart disease Mother    Hypertension Father    Prostate cancer Brother    CAD Brother    Outpatient Medications Prior to Visit  Medication Sig Dispense Refill   allopurinol (ZYLOPRIM) 300 MG tablet Take 300 mg by mouth daily.     aspirin 81 MG tablet Take 81 mg by mouth daily.     atorvastatin (LIPITOR) 40 MG tablet TAKE 1 TABLET BY MOUTH EVERY DAY 90 tablet 3   chlorthalidone (HYGROTON) 25 MG tablet TAKE 1 TABLET (25 MG TOTAL) BY MOUTH DAILY. 30 tablet 0   clopidogrel (PLAVIX) 75 MG tablet TAKE 1 TABLET BY MOUTH EVERY DAY 90 tablet 2  fenofibrate micronized (LOFIBRA) 134 MG capsule Take 134 mg by mouth daily.     fluticasone (FLONASE) 50 MCG/ACT nasal spray PLACE 1 SPRAY INTO BOTH NOSTRILS 2 (TWO) TIMES DAILY 48 mL 2   gabapentin (NEURONTIN) 300 MG capsule Take 1-2 capsules (300-600 mg total) by mouth at bedtime. 180 capsule 1   levocetirizine (XYZAL) 5 MG tablet Take 5 mg by mouth at bedtime as needed for allergies.     metFORMIN (GLUCOPHAGE) 1000 MG tablet Take 1 tablet by mouth 1 day or 1 dose.     metoprolol succinate (TOPROL-XL) 25 MG 24 hr tablet  TAKE 0.5 TABLETS BY MOUTH 2 TIMES DAILY. 90 tablet 3   multivitamin-iron-minerals-folic acid (CENTRUM) chewable tablet Chew 1 tablet by mouth daily.     Omega-3 Fatty Acids (FISH OIL) 1200 MG CAPS Take 2 capsules by mouth 2 (two) times daily.     pantoprazole (PROTONIX) 40 MG tablet TAKE 1 TABLET BY MOUTH EVERY DAY 90 tablet 3   tadalafil (CIALIS) 10 MG tablet Take 1 tablet (10 mg total) by mouth every other day as needed for erectile dysfunction. 10 tablet 1   tamsulosin (FLOMAX) 0.4 MG CAPS capsule Take 1 capsule (0.4 mg total) by mouth daily. 90 capsule 3   nitroGLYCERIN (NITROSTAT) 0.4 MG SL tablet Place 1 tablet (0.4 mg total) under the tongue every 5 (five) minutes as needed for chest pain. 100 tablet 1   No facility-administered medications prior to visit.   Allergies  Allergen Reactions   Ace Inhibitors Rash   Ropinirole Hcl Rash    Objective:   Today's Vitals   03/17/22 1058  BP: 132/80  Pulse: 79  Temp: 97.8 F (36.6 C)  TempSrc: Temporal  SpO2: 98%  Weight: 221 lb 3.2 oz (100.3 kg)  Height: 5\' 9"  (1.753 m)   Body mass index is 32.67 kg/m.   General: Well developed, well nourished. No acute distress. HEENT: Normocephalic, non-traumatic. Conjunctiva clear. External ears normal. Right EAC with   impacted wax. Left TM is normal Nose with mild congestion and rhinorrhea. Mucous membranes moist.   Oropharynx clear. Good dentition. Neck: Supple. No lymphadenopathy. No thyromegaly. Lungs: Clear to auscultation bilaterally. No wheezing, rales or rhonchi. Psych: Alert and oriented. Normal mood and affect.  Health Maintenance Due  Topic Date Due   Medicare Annual Wellness (AWV)  Never done   Zoster Vaccines- Shingrix (1 of 2) Never done   Lung Cancer Screening  12/05/2017   OPHTHALMOLOGY EXAM  01/21/2021   FOOT EXAM  06/28/2021   Lab Results POCT Covid: Neg. POCT Influenza A& B: Positive for Influenza A    Assessment & Plan:   1. Influenza A Discussed home care  for viral illness, including rest, pushing fluids, and OTC medications as needed for symptom relief. Recommend hot tea with honey for sore throat symptoms. I will prescribe Tamiflu. Follow-up if needed for worsening or persistent symptoms.  - POCT Influenza A/B - POC COVID-19 - oseltamivir (TAMIFLU) 75 MG capsule; Take 1 capsule (75 mg total) by mouth 2 (two) times daily.  Dispense: 10 capsule; Refill: 0  2. Type 2 diabetes mellitus without complication, without long-term current use of insulin (HCC) I will check on A1c today. I will have him continue metformin 1,000 mg daily. I will have him follow-up within a month to catch  up on his chronic care issues.   - Glucose, random - Hemoglobin A1c  3. Coronary artery disease of native artery of native heart with stable  angina pectoris (HCC) Stable. Requests refill of NTG tablets.  - nitroGLYCERIN (NITROSTAT) 0.4 MG SL tablet; Place 1 tablet (0.4 mg total) under the tongue every 5 (five) minutes as needed for chest pain.  Dispense: 100 tablet; Refill: 1   Return in about 4 weeks (around 04/14/2022) for Follow up on diabetes.   Loyola Mast, MD

## 2022-03-20 ENCOUNTER — Telehealth: Payer: Self-pay | Admitting: Family Medicine

## 2022-03-20 NOTE — Telephone Encounter (Signed)
Patient states: - He called yesterday, about 4pm, due to lingering cough  - Seen in office 12/26; was told nurse would call him   Patient requests:  - A callback from nurse

## 2022-03-24 NOTE — Telephone Encounter (Signed)
Spoke to patient, he states he is feeling much better  Dm/cma

## 2022-03-25 NOTE — Telephone Encounter (Signed)
No show history: 11/10/2021 12/04/2021 03/11/2022  Pt came in 03/17/22 and has appt scheduled for 04/14/22. Please advise if you would like to proceed with dismissal.

## 2022-03-26 NOTE — Telephone Encounter (Signed)
Noted! Thank you

## 2022-04-03 ENCOUNTER — Encounter: Payer: Self-pay | Admitting: Family Medicine

## 2022-04-03 ENCOUNTER — Ambulatory Visit (INDEPENDENT_AMBULATORY_CARE_PROVIDER_SITE_OTHER): Payer: Medicare PPO | Admitting: Family Medicine

## 2022-04-03 VITALS — BP 118/74 | HR 81 | Temp 97.3°F | Ht 69.0 in | Wt 211.0 lb

## 2022-04-03 DIAGNOSIS — K529 Noninfective gastroenteritis and colitis, unspecified: Secondary | ICD-10-CM | POA: Diagnosis not present

## 2022-04-03 LAB — CBC
HCT: 47.7 % (ref 39.0–52.0)
Hemoglobin: 16.1 g/dL (ref 13.0–17.0)
MCHC: 33.6 g/dL (ref 30.0–36.0)
MCV: 88.9 fl (ref 78.0–100.0)
Platelets: 466 10*3/uL — ABNORMAL HIGH (ref 150.0–400.0)
RBC: 5.37 Mil/uL (ref 4.22–5.81)
RDW: 13.2 % (ref 11.5–15.5)
WBC: 8.4 10*3/uL (ref 4.0–10.5)

## 2022-04-03 LAB — BASIC METABOLIC PANEL
BUN: 13 mg/dL (ref 6–23)
CO2: 28 mEq/L (ref 19–32)
Calcium: 10.4 mg/dL (ref 8.4–10.5)
Chloride: 99 mEq/L (ref 96–112)
Creatinine, Ser: 1.06 mg/dL (ref 0.40–1.50)
GFR: 73.1 mL/min (ref >60.00)
Glucose, Bld: 133 mg/dL — ABNORMAL HIGH (ref 70–99)
Potassium: 5.5 mEq/L — ABNORMAL HIGH (ref 3.5–5.1)
Sodium: 141 mEq/L (ref 135–145)

## 2022-04-03 NOTE — Progress Notes (Signed)
[facility] [address] [place] [address] Dept: [phone] Dept Fax: [phone]  Office Visit  Subjective:    Patient ID: Chris [name], male    DOB: April 01, 1955, 67 y.o..   MRN: [id number]  Chief Complaint  Patient presents with   Acute Visit    C/o having diarrhea, N&V off/on x 5 days. He has no appetite.      History of Present Illness:  Patient is in today complaining of diarrhea. Chris [name] was seen on 12/26 and diagnosed with influenza A. He notes he seemed to recover form this well. Over the weekend, his sister gave him some homemade pimento cheese. He ate this on Monday or Tuesday evening. He did note that the pimento cheese tasted off, like maybe it had spoiled, however, he finished eating it. Since then, he has had little int he way of appetite. Last night he had some nausea when taking his usual medications. Overnight, he began having diarrhea. He has had 7 episodes so far today. He bought some Imodium, but was unsure of taking this.  Past Medical History: Patient Active Problem List   Diagnosis Date Noted   Lower urinary tract symptoms (LUTS) 11/06/2021   Diabetic peripheral neuropathy ([place]) 06/28/2020   Diabetic retinopathy ([place]) 06/28/2020   Allergic rhinitis 06/28/2020   Hx of left ankle fracture- s/p ORIF 06/28/2020   History of pulmonary embolus (PE) 06/28/2020   Type 2 diabetes mellitus without complication, without long-term current use of insulin ([place]) 10/05/2018   Plantar fasciitis 10/05/2018   Right-sided epistaxis 12/16/2016   GERD (gastroesophageal reflux disease) 12/05/2016   Ventral hernia without obstruction or gangrene 11/30/2016   History of MI (myocardial infarction) 05/21/2014   Erectile dysfunction 07/25/2012   Restless leg syndrome 02/20/2010   Hyperlipidemia 02/19/2010   Obstructive sleep apnea 02/19/2010   Coronary artery disease 02/19/2010   Past Surgical History:   Procedure Laterality Date   APPENDECTOMY     CHOLECYSTECTOMY OPEN  ~ 2008   "it exploded"   CORONARY ANGIOPLASTY WITH STENT PLACEMENT  2009    2.75 x 28 mm Promus stent to the RCA   [place] N/A 11/30/2016   Procedure: INSERTION OF MESH;  Surgeon: Judeth Horn, MD;  Location: [place];  Service: General;  Laterality: N/A;   LAPAROSCOPIC APPENDECTOMY N/A 09/16/2016   Procedure: APPENDECTOMY LAPAROSCOPIC;  Surgeon: Judeth Horn, MD;  Location: [place];  Service: General;  Laterality: N/A;   LAPAROSCOPIC INCISIONAL / UMBILICAL / [place]  11/30/2016   w/repair serosal tear   ORIF ANKLE FRACTURE Left 11/27/2015   Procedure: OPEN REDUCTION INTERNAL FIXATION (ORIF) ANKLE FRACTURE;  Surgeon: Frederik Pear, MD;  Location: [place];  Service: Orthopedics;  Laterality: Left;   VENTRAL HERNIA REPAIR N/A 11/30/2016   Procedure: LAPAROSCOPIC VENTRAL HERNIA REPAIR AND  REPAIR OF SEROSAL TEAR;  Surgeon: Judeth Horn, MD;  Location: [place];  Service: General;  Laterality: N/A;   Family History  Problem Relation Age of Onset   Heart disease Mother    Hypertension Father    Prostate cancer Brother    CAD Brother    Outpatient Medications Prior to Visit  Medication Sig Dispense Refill   allopurinol (ZYLOPRIM) 300 MG tablet Take 300 mg by mouth daily.     aspirin 81 MG tablet Take 81 mg by mouth daily.     atorvastatin (LIPITOR)  40 MG tablet TAKE 1 TABLET BY MOUTH EVERY DAY 90 tablet 3   chlorthalidone (HYGROTON) 25 MG tablet TAKE 1 TABLET (25 MG TOTAL) BY MOUTH DAILY. 30 tablet 0   clopidogrel (PLAVIX) 75 MG tablet TAKE 1 TABLET BY MOUTH EVERY DAY 90 tablet 2   fenofibrate micronized (LOFIBRA) 134 MG capsule Take 134 mg by mouth daily.     fluticasone (FLONASE) 50 MCG/ACT nasal spray PLACE 1 SPRAY INTO BOTH NOSTRILS 2 (TWO) TIMES DAILY 48 mL 2   gabapentin (NEURONTIN) 300 MG capsule Take 1-2 capsules (300-600 mg total) by mouth at  bedtime. 180 capsule 1   levocetirizine (XYZAL) 5 MG tablet Take 5 mg by mouth at bedtime as needed for allergies.     metFORMIN (GLUCOPHAGE) 1000 MG tablet Take 1 tablet by mouth 1 day or 1 dose.     metoprolol succinate (TOPROL-XL) 25 MG 24 hr tablet TAKE 0.5 TABLETS BY MOUTH 2 TIMES DAILY. 90 tablet 3   multivitamin-iron-minerals-folic acid (CENTRUM) chewable tablet Chew 1 tablet by mouth daily.     nitroGLYCERIN (NITROSTAT) 0.4 MG SL tablet Place 1 tablet (0.4 mg total) under the tongue every 5 (five) minutes as needed for chest pain. 100 tablet 1   Omega-3 Fatty Acids (FISH OIL) 1200 MG CAPS Take 2 capsules by mouth 2 (two) times daily.     pantoprazole (PROTONIX) 40 MG tablet TAKE 1 TABLET BY MOUTH EVERY DAY 90 tablet 3   tadalafil (CIALIS) 10 MG tablet Take 1 tablet (10 mg total) by mouth every other day as needed for erectile dysfunction. 10 tablet 1   tamsulosin (FLOMAX) 0.4 MG CAPS capsule Take 1 capsule (0.4 mg total) by mouth daily. 90 capsule 3   oseltamivir (TAMIFLU) 75 MG capsule Take 1 capsule (75 mg total) by mouth 2 (two) times daily. 10 capsule 0   No facility-administered medications prior to visit.   Allergies  Allergen Reactions   Ace Inhibitors Rash   Ropinirole Hcl Rash     Objective:   Today's Vitals   04/03/22 0901  BP: 118/74  Pulse: 81  Temp: (!) 97.3 F (36.3 C)  TempSrc: Temporal  SpO2: 97%  Weight: 211 lb (95.7 kg)  Height: 5\' 9"  (1.753 m)   Body mass index is 31.16 kg/m.   General: Well developed, well nourished. No acute distress. Lungs: Clear to auscultation bilaterally. No wheezing, rales or rhonchi. CV: RRR without murmurs or rubs. Pulses 2+ bilaterally. Abdomen: Soft, non-tender. Bowel sounds positive, normal pitch and frequency. No hepatosplenomegaly.   No rebound or guarding. Psych: Alert and oriented. Normal mood and affect.  Health Maintenance Due  Topic Date Due   Medicare Annual Wellness (AWV)  Never done   Zoster Vaccines-  Shingrix (1 of 2) Never done   Lung Cancer Screening  12/05/2017   OPHTHALMOLOGY EXAM  01/21/2021   FOOT EXAM  06/28/2021     Assessment & Plan:   1. Acute gastroenteritis Chris [name] may have some mild food poisoning vs. a viral gastroenteritis. His weight is down 10 lbs. I recommend he start taking the Imodium, 2 tabs initially and then 1 tab with each loose stool (up to 6 tabs in 24 hours). I will check a  CBC and BMP to assess for severity and dehydration. He should push fluids. We will monitor for recovery.  - Basic metabolic panel - CBC  Return for As scheduled.   Haydee Salter, MD

## 2022-04-03 NOTE — Patient Instructions (Signed)
Diarrhea, Adult Diarrhea is frequent loose and sometimes watery bowel movements. Diarrhea can make you feel weak and cause you to become dehydrated. Dehydration is a condition in which there is not enough water or other fluids in the body. Dehydration can make you tired and thirsty, cause you to have a dry mouth, and decrease how often you urinate. Diarrhea typically lasts 2-3 days. However, it can last longer if it is a sign of something more serious. It is important to treat your diarrhea as told by your health care provider. Follow these instructions at home: Eating and drinking     Follow these recommendations as told by your health care provider: Take an oral rehydration solution (ORS). This is an over-the-counter medicine that helps return your body to its normal balance of nutrients and water. It is found at pharmacies and retail stores. Drink enough fluid to keep your urine pale yellow. Drink fluids such as water, diluted fruit juice, and low-calorie sports drinks. You can drink milk also, if desired. Sucking on ice chips is another way to get fluids. Avoid drinking fluids that contain a lot of sugar or caffeine, such as soda, energy drinks, and regular sports drinks. Avoid alcohol. Eat bland, easy-to-digest foods in small amounts as you are able. These foods include bananas, applesauce, rice, lean meats, toast, and crackers. Avoid spicy or fatty foods.  Medicines Take over-the-counter and prescription medicines only as told by your health care provider. If you were prescribed antibiotics, take them as told by your health care provider. Do not stop using the antibiotic even if you start to feel better. General instructions  Wash your hands often using soap and water for at least 20 seconds. If soap and water are not available, use hand sanitizer. Others in the household should wash their hands as well. Hands should be washed: After using the toilet or changing a diaper. Before  preparing, cooking, or serving food. While caring for a sick person or while visiting someone in a hospital. Rest at home while you recover. Take a warm bath to relieve any burning or pain from frequent diarrhea episodes. Watch your condition for any changes. Contact a health care provider if: You have a fever. Your diarrhea gets worse. You have new symptoms. You vomit every time you eat or drink. You feel light-headed, dizzy, or have a headache. You have muscle cramps. You have signs of dehydration, such as: Dark urine, very little urine, or no urine. Cracked lips. Dry mouth. Sunken eyes. Sleepiness. Weakness. You have bloody or black stools or stools that look like tar. You have severe pain, cramping, or bloating in your abdomen. Your skin feels cold and clammy. You feel confused. Get help right away if: You have chest pain or your heart is beating very quickly. You have trouble breathing or you are breathing very quickly. You feel extremely weak or you faint. These symptoms may be an emergency. Get help right away. Call 911. Do not wait to see if the symptoms will go away. Do not drive yourself to the hospital. This information is not intended to replace advice given to you by your health care provider. Make sure you discuss any questions you have with your health care provider. Document Revised: 08/26/2021 Document Reviewed: 08/26/2021 Elsevier Patient Education  [place].

## 2022-04-13 ENCOUNTER — Ambulatory Visit (INDEPENDENT_AMBULATORY_CARE_PROVIDER_SITE_OTHER): Payer: Medicare PPO

## 2022-04-13 VITALS — Ht 69.0 in | Wt 201.0 lb

## 2022-04-13 DIAGNOSIS — Z0000 Encounter for general adult medical examination without abnormal findings: Secondary | ICD-10-CM | POA: Diagnosis not present

## 2022-04-13 NOTE — Patient Instructions (Signed)
Mr. Chris [name] , Thank you for taking time to come for your Medicare Wellness Visit. I appreciate your ongoing commitment to your health goals. Please review the following plan we discussed and let me know if I can assist you in the future.   These are the goals we discussed:  Goals      Exercise 3x per week (30 min per time)     Pt wants to exercise more        This is a list of the screening recommended for you and due dates:  Health Maintenance  Topic Date Due   Zoster (Shingles) Vaccine (1 of 2) Never done   Screening for Lung Cancer  12/05/2017   Eye exam for diabetics  01/21/2021   Complete foot exam   06/28/2021   COVID-19 Vaccine (1) 04/19/2022*   Flu Shot  06/21/2022*   Hemoglobin A1C  09/16/2022   Yearly kidney health urinalysis for diabetes  12/11/2022   Yearly kidney function blood test for diabetes  04/04/2023   Medicare Annual Wellness Visit  04/14/2023   DTaP/Tdap/Td vaccine (3 - Td or Tdap) 08/11/2025   Colon Cancer Screening  03/23/2030   Pneumonia Vaccine  Completed   Hepatitis C Screening: USPSTF Recommendation to screen - Ages 51-79 yo.  Completed   HPV Vaccine  Aged Out  *Topic was postponed. The date shown is not the original due date.    Advanced directives: YES  Conditions/risks identified: none  Next appointment: Follow up in one year for your annual wellness visit. 04/19/23 @10AM   Preventive Care 65 Years and Older, Male  Preventive care refers to lifestyle choices and visits with your health care provider that can promote health and wellness. What does preventive care include? A yearly physical exam. This is also called an annual well check. Dental exams once or twice a year. Routine eye exams. Ask your health care provider how often you should have your eyes checked. Personal lifestyle choices, including: Daily care of your teeth and gums. Regular physical activity. Eating a healthy diet. Avoiding tobacco and drug use. Limiting alcohol  use. Practicing safe sex. Taking low doses of aspirin every day. Taking vitamin and mineral supplements as recommended by your health care provider. What happens during an annual well check? The services and screenings done by your health care provider during your annual well check will depend on your age, overall health, lifestyle risk factors, and family history of disease. Counseling  Your health care provider may ask you questions about your: Alcohol use. Tobacco use. Drug use. Emotional well-being. Home and relationship well-being. Sexual activity. Eating habits. History of falls. Memory and ability to understand (cognition). Work and work Statistician. Screening  You may have the following tests or measurements: Height, weight, and BMI. Blood pressure. Lipid and cholesterol levels. These may be checked every 5 years, or more frequently if you are over 64 years old. Skin check. Lung cancer screening. You may have this screening every year starting at age 70 if you have a 30-pack-year history of smoking and currently smoke or have quit within the past 15 years. Fecal occult blood test (FOBT) of the stool. You may have this test every year starting at age 42. Flexible sigmoidoscopy or colonoscopy. You may have a sigmoidoscopy every 5 years or a colonoscopy every 10 years starting at age 86. Prostate cancer screening. Recommendations will vary depending on your family history and other risks. Hepatitis C blood test. Hepatitis B blood test. Sexually transmitted disease (STD)  testing. Diabetes screening. This is done by checking your blood sugar (glucose) after you have not eaten for a while (fasting). You may have this done every 1-3 years. Abdominal aortic aneurysm (AAA) screening. You may need this if you are a current or former smoker. Osteoporosis. You may be screened starting at age 79 if you are at high risk. Talk with your health care provider about your test results,  treatment options, and if necessary, the need for more tests. Vaccines  Your health care provider may recommend certain vaccines, such as: Influenza vaccine. This is recommended every year. Tetanus, diphtheria, and acellular pertussis (Tdap, Td) vaccine. You may need a Td booster every 10 years. Zoster vaccine. You may need this after age 17. Pneumococcal 13-valent conjugate (PCV13) vaccine. One dose is recommended after age 42. Pneumococcal polysaccharide (PPSV23) vaccine. One dose is recommended after age 64. Talk to your health care provider about which screenings and vaccines you need and how often you need them. This information is not intended to replace advice given to you by your health care provider. Make sure you discuss any questions you have with your health care provider. Document Released: 04/05/2015 Document Revised: 11/27/2015 Document Reviewed: 01/08/2015 Elsevier Interactive Patient Education  2017 [place] Prevention in the Home Falls can cause injuries. They can happen to people of all ages. There are many things you can do to make your home safe and to help prevent falls. What can I do on the outside of my home? Regularly fix the edges of walkways and driveways and fix any cracks. Remove anything that might make you trip as you walk through a door, such as a raised step or threshold. Trim any bushes or trees on the path to your home. Use bright outdoor lighting. Clear any walking paths of anything that might make someone trip, such as rocks or tools. Regularly check to see if handrails are loose or broken. Make sure that both sides of any steps have handrails. Any raised decks and porches should have guardrails on the edges. Have any leaves, snow, or ice cleared regularly. Use sand or salt on walking paths during winter. Clean up any spills in your garage right away. This includes oil or grease spills. What can I do in the bathroom? Use night  lights. Install grab bars by the toilet and in the tub and shower. Do not use towel bars as grab bars. Use non-skid mats or decals in the tub or shower. If you need to sit down in the shower, use a plastic, non-slip stool. Keep the floor dry. Clean up any water that spills on the floor as soon as it happens. Remove soap buildup in the tub or shower regularly. Attach bath mats securely with double-sided non-slip rug tape. Do not have throw rugs and other things on the floor that can make you trip. What can I do in the bedroom? Use night lights. Make sure that you have a light by your bed that is easy to reach. Do not use any sheets or blankets that are too big for your bed. They should not hang down onto the floor. Have a firm chair that has side arms. You can use this for support while you get dressed. Do not have throw rugs and other things on the floor that can make you trip. What can I do in the kitchen? Clean up any spills right away. Avoid walking on wet floors. Keep items that you use a lot in  easy-to-reach places. If you need to reach something above you, use a strong step stool that has a grab bar. Keep electrical cords out of the way. Do not use floor polish or wax that makes floors slippery. If you must use wax, use non-skid floor wax. Do not have throw rugs and other things on the floor that can make you trip. What can I do with my stairs? Do not leave any items on the stairs. Make sure that there are handrails on both sides of the stairs and use them. Fix handrails that are broken or loose. Make sure that handrails are as long as the stairways. Check any carpeting to make sure that it is firmly attached to the stairs. Fix any carpet that is loose or worn. Avoid having throw rugs at the top or bottom of the stairs. If you do have throw rugs, attach them to the floor with carpet tape. Make sure that you have a light switch at the top of the stairs and the bottom of the stairs. If  you do not have them, ask someone to add them for you. What else can I do to help prevent falls? Wear shoes that: Do not have high heels. Have rubber bottoms. Are comfortable and fit you well. Are closed at the toe. Do not wear sandals. If you use a stepladder: Make sure that it is fully opened. Do not climb a closed stepladder. Make sure that both sides of the stepladder are locked into place. Ask someone to hold it for you, if possible. Clearly mark and make sure that you can see: Any grab bars or handrails. First and last steps. Where the edge of each step is. Use tools that help you move around (mobility aids) if they are needed. These include: Canes. Walkers. Scooters. Crutches. Turn on the lights when you go into a dark area. Replace any light bulbs as soon as they burn out. Set up your furniture so you have a clear path. Avoid moving your furniture around. If any of your floors are uneven, fix them. If there are any pets around you, be aware of where they are. Review your medicines with your doctor. Some medicines can make you feel dizzy. This can increase your chance of falling. Ask your doctor what other things that you can do to help prevent falls. This information is not intended to replace advice given to you by your health care provider. Make sure you discuss any questions you have with your health care provider. Document Released: 01/03/2009 Document Revised: 08/15/2015 Document Reviewed: 04/13/2014 Elsevier Interactive Patient Education  2017 [employer].

## 2022-04-13 NOTE — Progress Notes (Signed)
Virtual Visit via Telephone Note  I connected with  Chris [name] on 04/13/22 at  9:30 AM EST by telephone and verified that I am speaking with the correct person using two identifiers.  Location: Patient: home Provider: [facility] Persons participating in the virtual visit: patient/Nurse Health Advisor   I discussed the limitations, risks, security and privacy concerns of performing an evaluation and management service by telephone and the availability of in person appointments. The patient expressed understanding and agreed to proceed.  Interactive audio and video telecommunications were attempted between this nurse and patient, however failed, due to patient having technical difficulties OR patient did not have access to video capability.  We continued and completed visit with audio only.  Some vital signs may be absent or patient reported.   Roger Shelter, LPN  Subjective:   Chris [name] is a 67 y.o. male who presents for Medicare Annual/Subsequent preventive examination.  Review of Systems     Cardiac Risk Factors include: advanced age (>46men, >52 women);diabetes mellitus;dyslipidemia;hypertension;male gender;obesity (BMI >30kg/m2)     Objective:    Today's Vitals   04/13/22 0921  Weight: 201 lb (91.2 kg)  Height: 5\' 9"  (1.753 m)   Body mass index is 29.68 kg/m.     04/13/2022    9:36 AM 12/05/2016    4:45 PM 12/05/2016   10:31 AM 11/30/2016    3:11 PM 11/27/2016    2:21 PM 09/17/2016    1:54 PM 09/16/2016    2:54 PM  Advanced Directives  Does Patient Have a Medical Advance Directive? Yes No No No No No No  Type of Paramedic of [place];Living will        Does patient want to make changes to medical advance directive? Yes (Inpatient - patient defers changing a medical advance directive and declines information at this time)        Copy of [place] in Chart? No - copy requested        Would patient like information on  creating a medical advance directive?  No - Patient declined  No - Patient declined No - Patient declined No - Patient declined     Current Medications (verified) Outpatient Encounter Medications as of 04/13/2022  Medication Sig   allopurinol (ZYLOPRIM) 300 MG tablet Take 300 mg by mouth daily.   aspirin 81 MG tablet Take 81 mg by mouth daily.   atorvastatin (LIPITOR) 40 MG tablet TAKE 1 TABLET BY MOUTH EVERY DAY   chlorthalidone (HYGROTON) 25 MG tablet TAKE 1 TABLET (25 MG TOTAL) BY MOUTH DAILY.   clopidogrel (PLAVIX) 75 MG tablet TAKE 1 TABLET BY MOUTH EVERY DAY   fenofibrate micronized (LOFIBRA) 134 MG capsule Take 134 mg by mouth daily.   fluticasone (FLONASE) 50 MCG/ACT nasal spray PLACE 1 SPRAY INTO BOTH NOSTRILS 2 (TWO) TIMES DAILY   gabapentin (NEURONTIN) 300 MG capsule Take 1-2 capsules (300-600 mg total) by mouth at bedtime.   levocetirizine (XYZAL) 5 MG tablet Take 5 mg by mouth at bedtime as needed for allergies.   metFORMIN (GLUCOPHAGE) 1000 MG tablet Take 1 tablet by mouth 1 day or 1 dose.   metoprolol succinate (TOPROL-XL) 25 MG 24 hr tablet TAKE 0.5 TABLETS BY MOUTH 2 TIMES DAILY.   multivitamin-iron-minerals-folic acid (CENTRUM) chewable tablet Chew 1 tablet by mouth daily.   nitroGLYCERIN (NITROSTAT) 0.4 MG SL tablet Place 1 tablet (0.4 mg total) under the tongue every 5 (five) minutes as needed for chest  pain.   Omega-3 Fatty Acids (FISH OIL) 1200 MG CAPS Take 2 capsules by mouth 2 (two) times daily.   pantoprazole (PROTONIX) 40 MG tablet TAKE 1 TABLET BY MOUTH EVERY DAY   tadalafil (CIALIS) 10 MG tablet Take 1 tablet (10 mg total) by mouth every other day as needed for erectile dysfunction.   tamsulosin (FLOMAX) 0.4 MG CAPS capsule Take 1 capsule (0.4 mg total) by mouth daily.   No facility-administered encounter medications on file as of 04/13/2022.    Allergies (verified) Ace inhibitors and Ropinirole hcl   History: Past Medical History:  Diagnosis Date   Acute  MI, inferior wall, initial episode of care [facility]) 2009   Allergy    Anemia    Arthritis    Coronary atherosclerosis of unspecified type of vessel, native or graft    GERD (gastroesophageal reflux disease)    High cholesterol    Hypertension    Seasonal allergies    Sleep apnea    "I've never worn a mask" (09/17/2016)   Past Surgical History:  Procedure Laterality Date   APPENDECTOMY     CHOLECYSTECTOMY OPEN  ~ 2008   "it exploded"   CORONARY ANGIOPLASTY WITH STENT PLACEMENT  2009    2.75 x 28 mm Promus stent to the RCA   [place] N/A 11/30/2016   Procedure: INSERTION OF MESH;  Surgeon: Judeth Horn, MD;  Location: [place];  Service: General;  Laterality: N/A;   LAPAROSCOPIC APPENDECTOMY N/A 09/16/2016   Procedure: APPENDECTOMY LAPAROSCOPIC;  Surgeon: Judeth Horn, MD;  Location: [place];  Service: General;  Laterality: N/A;   LAPAROSCOPIC INCISIONAL / UMBILICAL / [place]  11/30/2016   w/repair serosal tear   ORIF ANKLE FRACTURE Left 11/27/2015   Procedure: OPEN REDUCTION INTERNAL FIXATION (ORIF) ANKLE FRACTURE;  Surgeon: Frederik Pear, MD;  Location: [place];  Service: Orthopedics;  Laterality: Left;   VENTRAL HERNIA REPAIR N/A 11/30/2016   Procedure: LAPAROSCOPIC VENTRAL HERNIA REPAIR AND  REPAIR OF SEROSAL TEAR;  Surgeon: Judeth Horn, MD;  Location: [place];  Service: General;  Laterality: N/A;   Family History  Problem Relation Age of Onset   Heart disease Mother    Hypertension Father    Prostate cancer Brother    CAD Brother    Social History   Socioeconomic History   Marital status: Married    Spouse name: Not on file   Number of children: Not on file   Years of education: Not on file   Highest education level: Not on file  Occupational History   Occupation: Freight forwarder - [employer]  Tobacco Use   Smoking status: Former    Packs/day: 2.50    Years: 36.00    Total pack years: 90.00     Types: Cigarettes    Quit date: 08/22/2007    Years since quitting: 14.6   Smokeless tobacco: Never  Vaping Use   Vaping Use: Never used  Substance and Sexual Activity   Alcohol use: Yes    Alcohol/week: 6.0 standard drinks of alcohol    Types: 6 Cans of beer per week   Drug use: Never    Types: Amphetamines   Sexual activity: Yes  Other Topics Concern   Not on file  Social History Narrative   Lives with wife. Exercises occasionally.   Social Determinants of Health   Financial Resource Strain: Low Risk  (04/13/2022)  Overall Financial Resource Strain (CARDIA)    Difficulty of Paying Living Expenses: Not hard at all  Food Insecurity: No Food Insecurity (04/13/2022)   Hunger Vital Sign    Worried About Running Out of Food in the Last Year: Never true    Ran Out of Food in the Last Year: Never true  Transportation Needs: No Transportation Needs (04/13/2022)   PRAPARE - Hydrologist (Medical): No    Lack of Transportation (Non-Medical): No  Physical Activity: Insufficiently Active (04/13/2022)   Exercise Vital Sign    Days of Exercise per Week: 3 days    Minutes of Exercise per Session: 30 min  Stress: No Stress Concern Present (04/13/2022)   [place]    Feeling of Stress : Not at all  Social Connections: Moderately Integrated (04/13/2022)   Social Connection and Isolation Panel [NHANES]    Frequency of Communication with Friends and Family: More than three times a week    Frequency of Social Gatherings with Friends and Family: Three times a week    Attends Religious Services: More than 4 times per year    Active Member of Clubs or Organizations: No    Attends Archivist Meetings: Never    Marital Status: Married    Tobacco Counseling Counseling given: Not Answered   Clinical Intake:  Pre-visit preparation completed: Yes  Pain : No/denies pain     BMI - recorded:  29.68 Nutritional Status: BMI 25 -29 Overweight Nutritional Risks: None Diabetes: Yes CBG done?: No (televisit) Did pt. bring in CBG monitor from home?: No  How often do you need to have someone help you when you read instructions, pamphlets, or other written materials from your doctor or pharmacy?: 1 - Never  Diabetic?yes  Interpreter Needed?: No  Information entered by :: B.Kiyo Heal,LPN   Activities of Daily Living    04/13/2022    9:38 AM  In your present state of health, do you have any difficulty performing the following activities:  Hearing? 0  Vision? 0  Difficulty concentrating or making decisions? 0  Walking or climbing stairs? 0  Dressing or bathing? 0  Doing errands, shopping? 0  Preparing Food and eating ? N  Using the Toilet? N  In the past six months, have you accidently leaked urine? N  Do you have problems with loss of bowel control? N  Managing your Medications? N  Managing your Finances? N  Housekeeping or managing your Housekeeping? N    Patient Care Team: Haydee Salter, MD as PCP - General (Family Medicine) Judeth Horn, MD as Consulting Physician (General Surgery) Frederik Pear, MD as Consulting Physician (Orthopedic Surgery) Bernarda Caffey, MD as Consulting Physician (Ophthalmology) Garrel Ridgel, DPM as Consulting Physician (Podiatry) Nahser, Wonda Cheng, MD as Consulting Physician (Cardiology)  Indicate any recent Medical Services you may have received from other than [facility] providers in the past year (date may be approximate).     Assessment:   This is a routine wellness examination for [place].  Hearing/Vision screen Hearing Screening - Comments:: No hearing problems Vision Screening - Comments:: Reading glasses;Due for eye..will schedule  Dietary issues and exercise activities discussed: Current Exercise Habits: Home exercise routine, Type of exercise: walking, Time (Minutes): 30, Frequency (Times/Week): 3, Weekly Exercise (Minutes/Week): 90,  Intensity: Mild, Exercise limited by: None identified   Goals Addressed             This Visit's Progress  Exercise 3x per week (30 min per time)       Pt wants to exercise more       Depression Screen    04/13/2022    9:32 AM 11/04/2021    1:50 PM 06/28/2020    3:07 PM 10/05/2018   10:01 AM 08/24/2017    5:38 PM 06/28/2017   11:59 AM 06/18/2017   11:35 AM  PHQ 2/9 Scores  PHQ - 2 Score 0 0 0 0 0 0 0  PHQ- 9 Score  1         Fall Risk    04/13/2022    9:27 AM 11/04/2021    1:12 PM 10/09/2019    9:06 AM 04/10/2019    8:09 AM 10/05/2018   10:01 AM  Fall Risk   Falls in the past year? 1 1 0 0 0  Number falls in past yr: 0 0  0   Injury with Fall? 1 1 0 0 0  Risk for fall due to : No Fall Risks      Follow up Education provided;Falls prevention discussed        FALL RISK PREVENTION PERTAINING TO THE HOME:  Any stairs in or around the home? No  If so, are there any without handrails? No  Home free of loose throw rugs in walkways, pet beds, electrical cords, etc? Yes  Adequate lighting in your home to reduce risk of falls? Yes   ASSISTIVE DEVICES UTILIZED TO PREVENT FALLS:  Life alert? No  Use of a cane, walker or w/c? No  Grab bars in the bathroom? No  Shower chair or bench in shower? No  Elevated toilet seat or a handicapped toilet? No        04/13/2022    9:42 AM  6CIT Screen  What Year? 0 points  What month? 0 points  What time? 0 points  Count back from 20 0 points  Months in reverse 0 points  Repeat phrase 2 points  Total Score 2 points    Immunizations Immunization History  Administered Date(s) Administered   Fluad Quad(high Dose 65+) 03/20/2019   Influenza Inj Mdck Quad Pf 01/23/2018   Influenza,inj,Quad PF,6+ Mos 04/03/2013, 12/25/2016   Influenza,inj,quad, With Preservative 01/23/2018   Influenza-Unspecified 02/20/2014   PNEUMOCOCCAL CONJUGATE-20 12/10/2021   Pneumococcal Conjugate-13 02/20/2014   Tdap 03/23/2005, 08/12/2015    TDAP status:  Up to date  Flu Vaccine status: Declined, Education has been provided regarding the importance of this vaccine but patient still declined. Advised may receive this vaccine at local pharmacy or Health Dept. Aware to provide a copy of the vaccination record if obtained from local pharmacy or Health Dept. Verbalized acceptance and understanding.  Pneumococcal vaccine status: Declined,  Education has been provided regarding the importance of this vaccine but patient still declined. Advised may receive this vaccine at local pharmacy or Health Dept. Aware to provide a copy of the vaccination record if obtained from local pharmacy or Health Dept. Verbalized acceptance and understanding.   Covid-19 vaccine status: Declined, Education has been provided regarding the importance of this vaccine but patient still declined. Advised may receive this vaccine at local pharmacy or Health Dept.or vaccine clinic. Aware to provide a copy of the vaccination record if obtained from local pharmacy or Health Dept. Verbalized acceptance and understanding.  Qualifies for Shingles Vaccine? Yes   Zostavax completed No   Shingrix Completed?: No.    Education has been provided regarding the importance of this vaccine. Patient  has been advised to call insurance company to determine out of pocket expense if they have not yet received this vaccine. Advised may also receive vaccine at local pharmacy or Health Dept. Verbalized acceptance and understanding.  Screening Tests Health Maintenance  Topic Date Due   Zoster Vaccines- Shingrix (1 of 2) Never done   Lung Cancer Screening  12/05/2017   OPHTHALMOLOGY EXAM  01/21/2021   FOOT EXAM  06/28/2021   COVID-19 Vaccine (1) 04/19/2022 (Originally 03/09/1956)   INFLUENZA VACCINE  06/21/2022 (Originally 10/21/2021)   HEMOGLOBIN A1C  09/16/2022   Diabetic kidney evaluation - Urine ACR  12/11/2022   Diabetic kidney evaluation - eGFR measurement  04/04/2023   Medicare Annual Wellness (AWV)   04/14/2023   DTaP/Tdap/Td (3 - Td or Tdap) 08/11/2025   COLONOSCOPY (Pts 45-50yrs Insurance coverage will need to be confirmed)  03/23/2030   Pneumonia Vaccine 38+ Years old  Completed   Hepatitis C Screening  Completed   HPV VACCINES  Aged Out    Health Maintenance  Health Maintenance Due  Topic Date Due   Zoster Vaccines- Shingrix (1 of 2) Never done   Lung Cancer Screening  12/05/2017   OPHTHALMOLOGY EXAM  01/21/2021   FOOT EXAM  06/28/2021    Colorectal cancer screening: Type of screening: Colonoscopy. Completed yes. Repeat every 3 years  Lung Cancer Screening: (Low Dose CT Chest recommended if Age 28-80 years, 30 pack-year currently smoking OR have quit w/in 15years.) does qualify.   Lung Cancer Screening Referral: no  Additional Screening:  Hepatitis C Screening: does not qualify; Completed no  Vision Screening: Recommended annual ophthalmology exams for early detection of glaucoma and other disorders of the eye. Is the patient up to date with their annual eye exam?  No  Who is the provider or what is the name of the office in which the patient attends annual eye exams? Will call for appt If pt is not established with a provider, would they like to be referred to a provider to establish care? No .   Dental Screening: Recommended annual dental exams for proper oral hygiene  Community Resource Referral / Chronic Care Management: CRR required this visit?  No   CCM required this visit?  No      Plan:     I have personally reviewed and noted the following in the patient's chart:   Medical and social history Use of alcohol, tobacco or illicit drugs  Current medications and supplements including opioid prescriptions. Patient is not currently taking opioid prescriptions. Functional ability and status Nutritional status Physical activity Advanced directives List of other physicians Hospitalizations, surgeries, and ER visits in previous 12  months Vitals Screenings to include cognitive, depression, and falls Referrals and appointments  In addition, I have reviewed and discussed with patient certain preventive protocols, quality metrics, and best practice recommendations. A written personalized care plan for preventive services as well as general preventive health recommendations were provided to patient.     Sue Lush, LPN   [id number]   Nurse Notes: [facility]

## 2022-04-14 ENCOUNTER — Ambulatory Visit: Payer: Medicare PPO | Admitting: Family Medicine

## 2022-04-15 ENCOUNTER — Ambulatory Visit (INDEPENDENT_AMBULATORY_CARE_PROVIDER_SITE_OTHER): Payer: Medicare PPO | Admitting: Family Medicine

## 2022-04-15 ENCOUNTER — Encounter: Payer: Self-pay | Admitting: Family Medicine

## 2022-04-15 VITALS — BP 122/66 | HR 68 | Temp 97.6°F | Ht 69.0 in | Wt 218.0 lb

## 2022-04-15 DIAGNOSIS — E11319 Type 2 diabetes mellitus with unspecified diabetic retinopathy without macular edema: Secondary | ICD-10-CM | POA: Diagnosis not present

## 2022-04-15 DIAGNOSIS — M25572 Pain in left ankle and joints of left foot: Secondary | ICD-10-CM

## 2022-04-15 DIAGNOSIS — K529 Noninfective gastroenteritis and colitis, unspecified: Secondary | ICD-10-CM

## 2022-04-15 DIAGNOSIS — E1142 Type 2 diabetes mellitus with diabetic polyneuropathy: Secondary | ICD-10-CM | POA: Diagnosis not present

## 2022-04-15 DIAGNOSIS — G8929 Other chronic pain: Secondary | ICD-10-CM

## 2022-04-15 DIAGNOSIS — N521 Erectile dysfunction due to diseases classified elsewhere: Secondary | ICD-10-CM

## 2022-04-15 DIAGNOSIS — I25118 Atherosclerotic heart disease of native coronary artery with other forms of angina pectoris: Secondary | ICD-10-CM

## 2022-04-15 DIAGNOSIS — E875 Hyperkalemia: Secondary | ICD-10-CM | POA: Diagnosis not present

## 2022-04-15 LAB — BASIC METABOLIC PANEL
BUN: 13 mg/dL (ref 6–23)
CO2: 29 mEq/L (ref 19–32)
Calcium: 9.8 mg/dL (ref 8.4–10.5)
Chloride: 99 mEq/L (ref 96–112)
Creatinine, Ser: 0.96 mg/dL (ref 0.40–1.50)
GFR: 82.31 mL/min (ref >60.00)
Glucose, Bld: 235 mg/dL — ABNORMAL HIGH (ref 70–99)
Potassium: 4.2 mEq/L (ref 3.5–5.1)
Sodium: 137 mEq/L (ref 135–145)

## 2022-04-15 MED ORDER — METFORMIN HCL 1000 MG PO TABS: 1000.0000 mg | ORAL_TABLET | Freq: Two times a day (BID) | ORAL | 3 refills | Status: DC

## 2022-04-15 NOTE — Patient Instructions (Addendum)
Start taking your metformin twice a day.

## 2022-04-15 NOTE — Assessment & Plan Note (Signed)
Last A1c was above goal. I recommend we increase his metformin to 1,000 mg bid.

## 2022-04-15 NOTE — Assessment & Plan Note (Signed)
Has not found Viagra or Cialis to be helpful. He does report headache with Cialis. I will refer to urology to discuss other potential options for improving ability to get erections.

## 2022-04-15 NOTE — Assessment & Plan Note (Signed)
Stable. Continue gabapentin 300 mg 1-2 at bedtime. Continue to work on improved blood sugar control.

## 2022-04-15 NOTE — Assessment & Plan Note (Signed)
No current chest pain. Quit tobacco 16 years ago. Continue aspirin 81 mg daily, Plavix 75 mg daily, metoprolol succinate 25 mg daily, and has NTG PRN.

## 2022-04-15 NOTE — Progress Notes (Signed)
[facility] [address] [place] [address] Dept: [phone] Dept Fax: [phone]  Office Visit  Subjective:    Patient ID: Chris [name], male    DOB: [birth year], 67 y.o..   MRN: [id number]  Chief Complaint  Patient presents with   Follow-up    4 week f/u.  No concerns.      History of Present Illness:  Patient is in today for follow-up from recent acute gastroenteritis. He notes that his symptoms have now resolved and his is feeling better.  Mr. Cuny has a history of Type 2 diabetes complicated with neuropathy and retinopathy. He is managed on metformin 1000 mg daily. He has not seen an eye doctor in the past year. His neuropathy is managed with gabapentin 300 mg 1-2 at bedtime.    Mr. Mcadam has a past history of a myocardial infarction and is s/p placement of 2 cardiac stents. He is on an aspirin 81 mg daily, Plavix 75 mg daily, metoprolol succinate 25 mg daily, and has NTG available. He also has hyperlipidemia and is treated with atorvastatin 40 mg daily, fenofibrate 134 mg daily, and fish oil.   Mr. Winfree has a history of erectile dysfunction. He has been managed with Viagra and Cialis, but feels neither is really working for him anymore.  Mr. Hoffmeister has a history of having had an ORIF of a left ankle fracture about 8 years ago. he notes he has some persistent pain laterally. At times, he feels he has a screw that is loose and is trying to work out of the hardware.  Past Medical History: Patient Active Problem List   Diagnosis Date Noted   Lower urinary tract symptoms (LUTS) 11/06/2021   Diabetic peripheral neuropathy (HCC) 06/28/2020   Diabetic retinopathy (HCC) 06/28/2020   Allergic rhinitis 06/28/2020   Hx of left ankle fracture- s/p ORIF 06/28/2020   History of pulmonary embolus (PE) 06/28/2020   Type 2 diabetes mellitus without complication, without long-term current use of insulin (HCC)  10/05/2018   Plantar fasciitis 10/05/2018   Right-sided epistaxis 12/16/2016   GERD (gastroesophageal reflux disease) 12/05/2016   Ventral hernia without obstruction or gangrene 11/30/2016   History of MI (myocardial infarction) 05/21/2014   Erectile dysfunction 07/25/2012   Restless leg syndrome 02/20/2010   Hyperlipidemia 02/19/2010   Obstructive sleep apnea 02/19/2010   Coronary artery disease 02/19/2010   Past Surgical History:  Procedure Laterality Date   APPENDECTOMY     CHOLECYSTECTOMY OPEN  ~ 2008   "it exploded"   CORONARY ANGIOPLASTY WITH STENT PLACEMENT  2009    2.75 x 28 mm Promus stent to the RCA   FRACTURE SURGERY     HERNIA REPAIR     INSERTION OF MESH N/A 11/30/2016   Procedure: INSERTION OF MESH;  Surgeon: [name], MD;  Location: [facility];  Service: General;  Laterality: N/A;   LAPAROSCOPIC APPENDECTOMY N/A 09/16/2016   Procedure: APPENDECTOMY LAPAROSCOPIC;  Surgeon: [name], MD;  Location: [facility] OR;  Service: General;  Laterality: N/A;   LAPAROSCOPIC INCISIONAL / UMBILICAL / VENTRAL HERNIA REPAIR  11/30/2016   w/repair serosal tear   ORIF ANKLE FRACTURE Left 11/27/2015   Procedure: OPEN REDUCTION INTERNAL FIXATION (ORIF) ANKLE FRACTURE;  Surgeon: [name], MD;  Location: [facility];  Service: Orthopedics;  Laterality: Left;   VENTRAL HERNIA REPAIR N/A 11/30/2016   Procedure: LAPAROSCOPIC VENTRAL HERNIA REPAIR AND  REPAIR OF SEROSAL TEAR;  Surgeon: [name], MD;  Location: [facility];  Service: General;  Laterality: N/A;   Family History  Problem Relation Age of Onset   Heart disease Mother    Hypertension Father    Prostate cancer Brother    CAD Brother    Outpatient Medications Prior to Visit  Medication Sig Dispense Refill   allopurinol (ZYLOPRIM) 300 MG tablet Take 300 mg by mouth daily.     aspirin 81 MG tablet Take 81 mg by mouth daily.     atorvastatin (LIPITOR) 40 MG tablet TAKE 1 TABLET BY MOUTH EVERY DAY 90 tablet 3    chlorthalidone (HYGROTON) 25 MG tablet TAKE 1 TABLET (25 MG TOTAL) BY MOUTH DAILY. 30 tablet 0   clopidogrel (PLAVIX) 75 MG tablet TAKE 1 TABLET BY MOUTH EVERY DAY 90 tablet 2   fenofibrate micronized (LOFIBRA) 134 MG capsule Take 134 mg by mouth daily.     fluticasone (FLONASE) 50 MCG/ACT nasal spray PLACE 1 SPRAY INTO BOTH NOSTRILS 2 (TWO) TIMES DAILY 48 mL 2   gabapentin (NEURONTIN) 300 MG capsule Take 1-2 capsules (300-600 mg total) by mouth at bedtime. 180 capsule 1   levocetirizine (XYZAL) 5 MG tablet Take 5 mg by mouth at bedtime as needed for allergies.     metFORMIN (GLUCOPHAGE) 1000 MG tablet Take 1 tablet by mouth 1 day or 1 dose.     metoprolol succinate (TOPROL-XL) 25 MG 24 hr tablet TAKE 0.5 TABLETS BY MOUTH 2 TIMES DAILY. 90 tablet 3   multivitamin-iron-minerals-folic acid (CENTRUM) chewable tablet Chew 1 tablet by mouth daily.     nitroGLYCERIN (NITROSTAT) 0.4 MG SL tablet Place 1 tablet (0.4 mg total) under the tongue every 5 (five) minutes as needed for chest pain. 100 tablet 1   Omega-3 Fatty Acids (FISH OIL) 1200 MG CAPS Take 2 capsules by mouth 2 (two) times daily.     pantoprazole (PROTONIX) 40 MG tablet TAKE 1 TABLET BY MOUTH EVERY DAY 90 tablet 3   tadalafil (CIALIS) 10 MG tablet Take 1 tablet (10 mg total) by mouth every other day as needed for erectile dysfunction. 10 tablet 1   tamsulosin (FLOMAX) 0.4 MG CAPS capsule Take 1 capsule (0.4 mg total) by mouth daily. 90 capsule 3   No facility-administered medications prior to visit.   Allergies  Allergen Reactions   Ace Inhibitors Rash   Ropinirole Hcl Rash     Objective:   Today's Vitals   04/15/22 0844  BP: 122/66  Pulse: 68  Temp: 97.6 F (36.4 C)  TempSrc: Temporal  SpO2: 98%  Weight: 218 lb (98.9 kg)  Height: 5\' 9"  (1.753 m)   Body mass index is 32.19 kg/m.   General: Well developed, well nourished. No acute distress. Feet- Skin intact. No sign of maceration between toes. Nails are normal. Dorsalis  pedis and posterior tibial   artery pulses are normal. 5.07 monofilament testing normal. Psych: Alert and oriented. Normal mood and affect.  Health Maintenance Due  Topic Date Due   Zoster Vaccines- Shingrix (1 of 2) Never done   OPHTHALMOLOGY EXAM  01/21/2021   Lab Results Last hemoglobin A1c Lab Results  Component Value Date   HGBA1C 7.6 (H) 03/17/2022    Assessment & Plan:   Problem List Items Addressed This Visit       Cardiovascular and Mediastinum   Coronary artery disease    No current chest pain. Quit tobacco 16 years ago. Continue aspirin 81 mg daily, Plavix 75 mg daily, metoprolol succinate 25 mg daily, and  has NTG PRN.        Endocrine   Type 2 diabetes mellitus with diabetic polyneuropathy, without long-term current use of insulin (HCC)    Last A1c was above goal. I recommend we increase his metformin to 1,000 mg bid.      Relevant Medications   metFORMIN (GLUCOPHAGE) 1000 MG tablet   Diabetic peripheral neuropathy (HCC)    Stable. Continue gabapentin 300 mg 1-2 at bedtime. Continue to work on improved blood sugar control.      Relevant Medications   metFORMIN (GLUCOPHAGE) 1000 MG tablet   Diabetic retinopathy ([place])    Continue to focus on improved blood sugar control. I will refer him to ophthalmology for annual diabetic eye exam.      Relevant Medications   metFORMIN (GLUCOPHAGE) 1000 MG tablet   Other Relevant Orders   Ambulatory referral to Ophthalmology     Other   Erectile dysfunction    Has not found Viagra or Cialis to be helpful. He does report headache with Cialis. I will refer to urology to discuss other potential options for improving ability to get erections.      Relevant Orders   Ambulatory referral to Urology   Chronic pain of left ankle- s/p ORIF of ankle fracture with hardware    Has chronic left ankle pain likely secondary to surgical hardware. He describes having a screw that protrudes at times. I will refer him back to his  orthopedic surgeon to consider hardware removal.      Relevant Orders   Ambulatory referral to Orthopedic Surgery   Other Visit Diagnoses     Acute gastroenteritis    -  Primary   Resolved. Weight is back up and looking better overall.   Hyperkalemia       Elevated potassium noted on recent blood test. Likely due to gastroenteritis. We will reassess today.   Relevant Orders   Basic metabolic panel        Return in about 3 months (around 07/15/2022) for Reassessment.   Haydee Salter, MD

## 2022-04-15 NOTE — Assessment & Plan Note (Signed)
Continue to focus on improved blood sugar control. I will refer him to ophthalmology for annual diabetic eye exam.

## 2022-04-15 NOTE — Assessment & Plan Note (Signed)
Has chronic left ankle pain likely secondary to surgical hardware. He describes having a screw that protrudes at times. I will refer him back to his orthopedic surgeon to consider hardware removal.

## 2022-04-23 NOTE — Progress Notes (Signed)
This encounter was created in error - please disregard.

## 2022-07-07 ENCOUNTER — Other Ambulatory Visit: Payer: Self-pay | Admitting: Cardiovascular Disease

## 2022-07-21 ENCOUNTER — Encounter: Payer: Self-pay | Admitting: Family Medicine

## 2022-07-21 LAB — HM DIABETES EYE EXAM

## 2022-07-27 ENCOUNTER — Telehealth: Payer: Self-pay | Admitting: Family Medicine

## 2022-07-27 NOTE — Telephone Encounter (Signed)
[facility] [phone]    Pt is out of his fenofibrate micronized (LOFIBRA) 134 MG capsule [[id number]] and needs a refill called in.

## 2022-07-28 MED ORDER — FENOFIBRATE MICRONIZED 134 MG PO CAPS: 134.0000 mg | ORAL_CAPSULE | Freq: Every day | ORAL | 0 refills | Status: DC

## 2022-07-28 NOTE — Telephone Encounter (Signed)
Rx sent to the pharmacy and patient notified VIA phone.  Dm/cma  

## 2022-07-29 ENCOUNTER — Other Ambulatory Visit: Payer: Self-pay | Admitting: Cardiovascular Disease

## 2022-08-04 ENCOUNTER — Other Ambulatory Visit: Payer: Self-pay | Admitting: Cardiovascular Disease

## 2022-08-11 ENCOUNTER — Ambulatory Visit (INDEPENDENT_AMBULATORY_CARE_PROVIDER_SITE_OTHER): Payer: Medicare PPO | Admitting: Family Medicine

## 2022-08-11 ENCOUNTER — Encounter: Payer: Self-pay | Admitting: Family Medicine

## 2022-08-11 VITALS — BP 136/78 | HR 82 | Temp 98.1°F | Ht 69.0 in | Wt 216.6 lb

## 2022-08-11 DIAGNOSIS — E1142 Type 2 diabetes mellitus with diabetic polyneuropathy: Secondary | ICD-10-CM | POA: Diagnosis not present

## 2022-08-11 DIAGNOSIS — I25118 Atherosclerotic heart disease of native coronary artery with other forms of angina pectoris: Secondary | ICD-10-CM | POA: Diagnosis not present

## 2022-08-11 DIAGNOSIS — Z7984 Long term (current) use of oral hypoglycemic drugs: Secondary | ICD-10-CM

## 2022-08-11 DIAGNOSIS — M545 Low back pain, unspecified: Secondary | ICD-10-CM | POA: Diagnosis not present

## 2022-08-11 DIAGNOSIS — I10 Essential (primary) hypertension: Secondary | ICD-10-CM

## 2022-08-11 DIAGNOSIS — G8929 Other chronic pain: Secondary | ICD-10-CM

## 2022-08-11 DIAGNOSIS — E782 Mixed hyperlipidemia: Secondary | ICD-10-CM

## 2022-08-11 MED ORDER — CHLORTHALIDONE 25 MG PO TABS: 25.0000 mg | ORAL_TABLET | Freq: Every day | ORAL | 3 refills | Status: DC

## 2022-08-11 NOTE — Assessment & Plan Note (Signed)
BP is mildly high today, but Chris [name] has been out of his chlorthalidone. I will renew this today.

## 2022-08-11 NOTE — Assessment & Plan Note (Signed)
We will check an A1c today. Cotninue metformin 1,000 mg bid.

## 2022-08-11 NOTE — Assessment & Plan Note (Signed)
Continue atorvastatin 40 mg daily, fenofibrate 134 mg daily, and fish oil

## 2022-08-11 NOTE — Assessment & Plan Note (Signed)
No current chest pain. Quit tobacco 16 years ago. Continue aspirin 81 mg daily, Plavix 75 mg daily, metoprolol succinate 25 mg daily, and has NTG PRN. 

## 2022-08-11 NOTE — Progress Notes (Signed)
[facility] [address] [place] [address] Dept: [phone] Dept Fax: [phone]  Office Visit  Subjective:    Patient ID: Chris [name], male    DOB: 1955/12/08, 67 y.o..   MRN: [id number]  Chief Complaint  Patient presents with   Back Pain    C/o lower back pain x months.  Has been taking Tylenol 500 mg.     History of Present Illness:  Patient is in today to discuss issues with low back pain. He notes this has been a long-standing issue. More recently, he notes morning pain and stiffness in the lower back. He also has issues after he sits for a period of time. 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 He denies any radiation down the buttocks or legs. He does occasionally get some left lateral hip pain. He has not had lower leg numbness, tingling, or weakness.  Chris [name] has a history of Type 2 diabetes complicated with neuropathy and retinopathy. He is managed on metformin 1000 mg twice daily. His neuropathy is managed with gabapentin 300 mg 1-2 at bedtime.    Chris [name] has a past history of a myocardial infarction and is s/p placement of 2 cardiac stents. He is on an aspirin 81 mg daily, Plavix 75 mg daily, metoprolol succinate 25 mg daily, and has NTG available. He also has hyperlipidemia and is treated with atorvastatin 40 mg daily, fenofibrate 134 mg daily, and fish oil.  Chris [name] has a history of hypertension. He is managed on chlorthalidone 25 mg daily and metoprolol succinate 25 mg daily.  Past Medical History: Patient Active Problem List   Diagnosis Date Noted   Chronic pain of left ankle- s/p ORIF of ankle fracture with hardware 04/15/2022   Lower urinary tract symptoms (LUTS) 11/06/2021   Diabetic peripheral neuropathy (HCC) 06/28/2020   Diabetic retinopathy (HCC) 06/28/2020   Allergic rhinitis 06/28/2020   Hx of left ankle fracture- s/p ORIF  06/28/2020   History of pulmonary embolus (PE) 06/28/2020   Type 2 diabetes mellitus with diabetic polyneuropathy, without long-term current use of insulin (HCC) 10/05/2018   Plantar fasciitis 10/05/2018   Right-sided epistaxis 12/16/2016   GERD (gastroesophageal reflux disease) 12/05/2016   Ventral hernia without obstruction or gangrene 11/30/2016   History of MI (myocardial infarction) 05/21/2014   Erectile dysfunction 07/25/2012   Restless leg syndrome 02/20/2010   Hyperlipidemia 02/19/2010   Obstructive sleep apnea 02/19/2010   Coronary artery disease 02/19/2010   Past Surgical History:  Procedure Laterality Date   APPENDECTOMY     CHOLECYSTECTOMY OPEN  ~ 2008   "it exploded"   CORONARY ANGIOPLASTY WITH STENT PLACEMENT  2009    2.75 x 28 mm Promus stent to the RCA   FRACTURE SURGERY     HERNIA REPAIR     INSERTION OF MESH N/A 11/30/2016   Procedure: INSERTION OF MESH;  Surgeon: [name], MD;  Location: [facility];  Service: General;  Laterality: N/A;   LAPAROSCOPIC APPENDECTOMY N/A 09/16/2016   Procedure: APPENDECTOMY LAPAROSCOPIC;  Surgeon: [name], MD;  Location: [facility];  Service: General;  Laterality: N/A;   LAPAROSCOPIC INCISIONAL / UMBILICAL / VENTRAL HERNIA REPAIR  11/30/2016   w/repair serosal tear   ORIF ANKLE FRACTURE Left 11/27/2015   Procedure: OPEN REDUCTION INTERNAL FIXATION (ORIF) ANKLE FRACTURE;  Surgeon: [name], MD;  Location: [facility];  Service: Orthopedics;  Laterality: Left;  VENTRAL HERNIA REPAIR N/A 11/30/2016   Procedure: LAPAROSCOPIC VENTRAL HERNIA REPAIR AND  REPAIR OF SEROSAL TEAR;  Surgeon: [name], MD;  Location: [facility];  Service: General;  Laterality: N/A;   Family History  Problem Relation Age of Onset   Heart disease Mother    Hypertension Father    Prostate cancer Brother    CAD Brother    Outpatient Medications Prior to Visit  Medication Sig Dispense Refill   allopurinol (ZYLOPRIM) 300 MG tablet Take 300 mg by  mouth daily.     Apple Cider Vinegar-Ginger 500-5 MG CHEW Chew by mouth.     aspirin 81 MG tablet Take 81 mg by mouth daily.     atorvastatin (LIPITOR) 40 MG tablet TAKE 1 TABLET BY MOUTH EVERY DAY 90 tablet 3   chlorthalidone (HYGROTON) 25 MG tablet TAKE 1 TABLET (25 MG TOTAL) BY MOUTH DAILY. 30 tablet 0   clopidogrel (PLAVIX) 75 MG tablet Take 1 tablet (75 mg total) by mouth daily. Pt needs yearly appt for # 90 supply. Please call office to schedule appt. 1st attempt. 30 tablet 0   fenofibrate micronized (LOFIBRA) 134 MG capsule Take 1 capsule (134 mg total) by mouth daily. 90 capsule 0   fluticasone (FLONASE) 50 MCG/ACT nasal spray PLACE 1 SPRAY INTO BOTH NOSTRILS 2 (TWO) TIMES DAILY 48 mL 2   gabapentin (NEURONTIN) 300 MG capsule Take 1-2 capsules (300-600 mg total) by mouth at bedtime. 180 capsule 1   levocetirizine (XYZAL) 5 MG tablet Take 5 mg by mouth at bedtime as needed for allergies.     metFORMIN (GLUCOPHAGE) 1000 MG tablet Take 1 tablet (1,000 mg total) by mouth 2 (two) times daily with a meal. 180 tablet 3   metoprolol succinate (TOPROL-XL) 25 MG 24 hr tablet TAKE 1/2 TABLET TWICE A DAY BY MOUTH 30 tablet 1   multivitamin-iron-minerals-folic acid (CENTRUM) chewable tablet Chew 1 tablet by mouth daily.     nitroGLYCERIN (NITROSTAT) 0.4 MG SL tablet Place 1 tablet (0.4 mg total) under the tongue every 5 (five) minutes as needed for chest pain. 100 tablet 1   Omega-3 Fatty Acids (FISH OIL) 1200 MG CAPS Take 2 capsules by mouth 2 (two) times daily.     pantoprazole (PROTONIX) 40 MG tablet TAKE 1 TABLET BY MOUTH EVERY DAY 90 tablet 3   tamsulosin (FLOMAX) 0.4 MG CAPS capsule Take 1 capsule (0.4 mg total) by mouth daily. 90 capsule 3   zinc gluconate 50 MG tablet Take 50 mg by mouth daily.     tadalafil (CIALIS) 10 MG tablet Take 1 tablet (10 mg total) by mouth every other day as needed for erectile dysfunction. (Patient not taking: Reported on 08/11/2022) 10 tablet 1   No  facility-administered medications prior to visit.   Allergies  Allergen Reactions   Ace Inhibitors Rash   Ropinirole Hcl Rash     Objective:   Today's Vitals   08/11/22 1521  BP: 136/78  Pulse: 82  Temp: 98.1 F (36.7 C)  TempSrc: Temporal  SpO2: 96%  Weight: 216 lb 9.6 oz (98.2 kg)  Height: 5\' 9"  (1.753 m)   Body mass index is 31.99 kg/m.   General: Well developed, well nourished. No acute distress. Back: Straight. Pain along left paralumbar muscles. Extremities: Full ROM. Strength 5/5 in LE bilat. Neuro: Normal sensation and DTR bilaterally. Psych: Alert and oriented. Normal mood and affect.  There are no preventive care reminders to display for this patient.    Assessment &  Plan:   Problem List Items Addressed This Visit       Cardiovascular and Mediastinum   Coronary artery disease    No current chest pain. Quit tobacco 16 years ago. Continue aspirin 81 mg daily, Plavix 75 mg daily, metoprolol succinate 25 mg daily, and has NTG PRN.      Relevant Medications   chlorthalidone (HYGROTON) 25 MG tablet   Essential hypertension    BP is mildly high today, but Mr. Bingaman has been out of his chlorthalidone. I will renew this today.      Relevant Medications   chlorthalidone (HYGROTON) 25 MG tablet     Endocrine   Type 2 diabetes mellitus with diabetic polyneuropathy, without long-term current use of insulin (HCC)    We will check an A1c today. Cotninue metformin 1,000 mg bid.      Relevant Orders   Glucose, random   Hemoglobin A1c     Other   Hyperlipidemia    Continue atorvastatin 40 mg daily, fenofibrate 134 mg daily, and fish oil      Relevant Medications   chlorthalidone (HYGROTON) 25 MG tablet   Other Visit Diagnoses     Chronic right-sided low back pain without sciatica    -  Primary   Appears to be soem arthritis with musclar strain. Recommend heat, stretches, and proper lifting technique.       Return in about 3 months (around  11/11/2022) for Reassessment.   Loyola Mast, MD

## 2022-08-12 LAB — GLUCOSE, RANDOM: Glucose, Bld: 105 mg/dL — ABNORMAL HIGH (ref 70–99)

## 2022-08-12 LAB — HEMOGLOBIN A1C: Hgb A1c MFr Bld: 7.1 % — ABNORMAL HIGH (ref 4.6–6.5)

## 2022-08-21 ENCOUNTER — Other Ambulatory Visit: Payer: Self-pay

## 2022-08-21 DIAGNOSIS — E1142 Type 2 diabetes mellitus with diabetic polyneuropathy: Secondary | ICD-10-CM

## 2022-08-21 MED ORDER — GABAPENTIN 300 MG PO CAPS
300.0000 mg | ORAL_CAPSULE | Freq: Every day | ORAL | 1 refills | Status: DC
Start: 2022-08-21 — End: 2023-02-19

## 2022-08-21 NOTE — Telephone Encounter (Signed)
Patient needs a refill for the Gabapentin 300 mg.  He is going out of town today, can this be sent in before that? Please review and advise.  Thanks. Dm/cma

## 2022-08-29 ENCOUNTER — Other Ambulatory Visit: Payer: Self-pay | Admitting: Cardiovascular Disease

## 2022-09-13 ENCOUNTER — Other Ambulatory Visit: Payer: Self-pay | Admitting: Cardiovascular Disease

## 2022-09-13 DIAGNOSIS — R12 Heartburn: Secondary | ICD-10-CM

## 2022-09-15 NOTE — Telephone Encounter (Signed)
Rx(s) sent to pharmacy electronically.  

## 2022-09-30 ENCOUNTER — Other Ambulatory Visit: Payer: Self-pay | Admitting: Cardiovascular Disease

## 2022-10-19 ENCOUNTER — Ambulatory Visit: Payer: Medicare PPO | Admitting: Nurse Practitioner

## 2022-10-22 ENCOUNTER — Encounter: Payer: Self-pay | Admitting: Cardiovascular Disease

## 2022-10-22 NOTE — Progress Notes (Signed)
Appt cancelled This encounter was created in error - please disregard. 

## 2022-10-23 ENCOUNTER — Ambulatory Visit: Payer: Medicare PPO | Admitting: Cardiovascular Disease

## 2022-10-27 ENCOUNTER — Other Ambulatory Visit: Payer: Self-pay | Admitting: Cardiovascular Disease

## 2022-11-24 ENCOUNTER — Other Ambulatory Visit: Payer: Self-pay | Admitting: Cardiovascular Disease

## 2022-12-02 DIAGNOSIS — N5201 Erectile dysfunction due to arterial insufficiency: Secondary | ICD-10-CM | POA: Diagnosis not present

## 2022-12-13 ENCOUNTER — Other Ambulatory Visit: Payer: Self-pay | Admitting: Cardiovascular Disease

## 2022-12-13 DIAGNOSIS — R12 Heartburn: Secondary | ICD-10-CM

## 2022-12-20 ENCOUNTER — Other Ambulatory Visit: Payer: Self-pay | Admitting: Cardiovascular Disease

## 2022-12-27 ENCOUNTER — Encounter: Payer: Self-pay | Admitting: Cardiovascular Disease

## 2022-12-27 NOTE — Progress Notes (Unsigned)
Chris [name]  Date of Birth  14-Dec-1955   Problems: 1. Coronary artery disease- He presented in 2009 with some episodes of chest pain and was found to have an inferior wall myocardial infarction. He underwent successful PTCA and stenting of his mid  right coronary artery using a 2.75 x 28 mm Promus stent. The stent was post dilated using a 3.0 noncompliant balloon.  We then positioned a 3.0 x 15 mm Promus stent in the proximal right coronary artery.  The stent was post dilated using a 3.25 mm noncompliant balloon. 2. Hyperlipidemia 3. Sleep apnea 4. Hypertension     Chris [name] is a 67 year old gentleman. He has a history of coronary artery disease. He presented in 2009 with some episodes of chest pain and was found to have an inferior wall myocardial infarction. He underwent successful PTCA and stenting of his mid  right coronary artery using a 2.75 x 28 mm Promus stent. The stent was post dilated using a 3.0 noncompliant balloon.  We then positioned a 3.0 x 15 mm Promus stent in the proximal right coronary artery.  The stent was post dilated using a 3.25 mm noncompliant balloon.  Pt is doing well.  No angina or dyspnea.    He has not been sleeping well.  He also did not take his meds this am.  April 13, 2012: Chris [name] is doing well from a cardiac standpoint.  Exercising on the treadmill every other day. Complains of constipation.  October 17, 2012:  Thank started having some episodes of chest pain last week.  He had been doing lots of physical activity - played 36 holes of golf a day  for 2 days straight.  He also has been busy doing phyisical work.     He had a stress Myoview study. He walked for 11 minutes on a standard Bruce protocol triple test. A Myoview study revealed an old inferior wall myocardial infarction but was otherwise unremarkable.   June 14, 2013:  He is doing well .[name] Has some back pain and "crick" in his neck.  No CP .  Staying active, walking in the evenings 2.5 miles    Sept. 29, 2015:  Chris [name] is doing ok.  He is not working at [employer].   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  Doing well from a cardiac standpoint.  Still walking quite a bit.    Aug. 29, 2016:  Doing well. Owns his own business.  Chris [name]  No CP , no dyspnea.   Jan. 17, 2017: No angina  Is staying busy Working hard.   Oct. 9, 2017:   Chris [name] is seen today for follow up of his CAD Is healing up from a left ankle injury.  Walking on crutches .   Wears him out.  Has not had any chest pain  Or dyspnea  Aug. 14, 2018: Chris [name] is seen for follow up for his CAD Had emergent Appy surgery. Was found to have a ventral hernia at that time. Now needs to have hernia repair.  He had no complications with his emergent appendectomy. He's been doing well. No episodes of chest pain or short of breath.  Feb. 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   No CP or dyspnea Works out on th eElliptical  Primary MD added Chlorthaladone .   2 years ago , he had broke his left ankle and developed a DVT / pulmonary  embolus .    Sept.  11, 2019  Having some pain in right shoulder.  Slept on it wrong .   May 16, 2018: Seen today for follow-up visit regarding his coronary artery disease.  He also has a history of hyperlipidemia and diabetes mellitus. Last lipid levels were from December 01, 2017: Total cholesterol is 130.  LDL is 54.  His triglyceride level is 203.  Having some right shoulder issues.   Getting a MRI today  May need to have shoulder surgery  He is at low risk for CV complications during shoulder surgery.  Sept. 15, 2020  Chris [name] is seen today  Has a friend who is selling a [employer] tractor   Stays busy. No CP .  Walks 4 miles a day .   May , 12, 2021  Doing well.   No CP  Did lots of work yesterday - removed his sidewalk yesterday Walks 4 miles twice a day  Has not had covid vaccines yet .    Aug 01, 2020: Chris [name] is doing welll. Hx of CAD HLD. Out mowing [employer] field behind his house  No CP ,  Walks 4 miles a day .   [place]    Aug 19, 2021 Chris [name] is doing well. Hx of CAD  HLD   Doing well.    No CP  Getting some exercise  Has some left foot swelling  after foot surgery .  Has developed some peripheral neuropathy    Aug. 2, 2024 Pt cancelled   Oct. 7, 2024 Chris [name] is seen for follow up of his CAD, HLD Wt is 221   No CP ,  No DOE,     Current Outpatient Medications on File Prior to Visit  Medication Sig Dispense Refill   allopurinol (ZYLOPRIM) 300 MG tablet Take 300 mg by mouth daily.     Apple Cider Vinegar-Ginger 500-5 MG CHEW Chew by mouth.     aspirin 81 MG tablet Take 81 mg by mouth daily.     atorvastatin (LIPITOR) 40 MG tablet TAKE 1 TABLET BY MOUTH EVERY DAY 90 tablet 3   chlorthalidone (HYGROTON) 25 MG tablet Take 1 tablet (25 mg total) by mouth daily. 90 tablet 3   clopidogrel (PLAVIX) 75 MG tablet TAKE 1 TABLET BY MOUTH EVERY DAY 15 tablet 0   fenofibrate micronized (LOFIBRA) 134 MG capsule Take 1 capsule (134 mg total) by mouth daily. 90 capsule 0   fluticasone (FLONASE) 50 MCG/ACT nasal spray PLACE 1 SPRAY INTO BOTH NOSTRILS 2 (TWO) TIMES DAILY 48 mL 2   gabapentin (NEURONTIN) 300 MG capsule Take 1-2 capsules (300-600 mg total) by mouth at bedtime. 180 capsule 1   levocetirizine (XYZAL) 5 MG tablet Take 5 mg by mouth at bedtime as needed for allergies.     metFORMIN (GLUCOPHAGE) 1000 MG tablet Take 1 tablet (1,000 mg total) by mouth 2 (two) times daily with a meal. 180 tablet 3   metoprolol succinate (TOPROL-XL) 25 MG 24 hr tablet TAKE 1/2 TABLET TWICE A DAY BY MOUTH 90 tablet 3   multivitamin-iron-minerals-folic acid (CENTRUM) chewable tablet Chew 1 tablet by mouth daily.     Omega-3 Fatty Acids (FISH OIL) 1200 MG CAPS Take 2 capsules by mouth 2 (two) times daily.     pantoprazole (PROTONIX) 40 MG tablet TAKE  1 TABLET BY MOUTH EVERY DAY 90 tablet 3   tamsulosin (FLOMAX) 0.4 MG CAPS capsule Take 1 capsule (0.4 mg total)  by mouth daily. 90 capsule 3   zinc gluconate 50 MG tablet Take 50 mg by mouth daily.     nitroGLYCERIN (NITROSTAT) 0.4 MG SL tablet Place 1 tablet (0.4 mg total) under the tongue every 5 (five) minutes as needed for chest pain. 100 tablet 1   No current facility-administered medications on file prior to visit.    Allergies  Allergen Reactions   Ace Inhibitors Rash   Ropinirole Hcl Rash    Past Medical History:  Diagnosis Date   Acute MI, inferior wall, initial episode of care [facility]) 2009   Allergy    Anemia    Arthritis    Coronary atherosclerosis of unspecified type of vessel, native or graft    GERD (gastroesophageal reflux disease)    High cholesterol    Hypertension    Seasonal allergies    Sleep apnea    "I've never worn a mask" (09/17/2016)    Past Surgical History:  Procedure Laterality Date   APPENDECTOMY     CHOLECYSTECTOMY OPEN  ~ 2008   "it exploded"   CORONARY ANGIOPLASTY WITH STENT PLACEMENT  2009    2.75 x 28 mm Promus stent to the RCA   FRACTURE SURGERY     HERNIA REPAIR     INSERTION OF MESH N/A 11/30/2016   Procedure: INSERTION OF MESH;  Surgeon: [name], MD;  Location: [facility] OR;  Service: General;  Laterality: N/A;   LAPAROSCOPIC APPENDECTOMY N/A 09/16/2016   Procedure: APPENDECTOMY LAPAROSCOPIC;  Surgeon: [name], MD;  Location: [facility] OR;  Service: General;  Laterality: N/A;   LAPAROSCOPIC INCISIONAL / UMBILICAL / VENTRAL HERNIA REPAIR  11/30/2016   w/repair serosal tear   ORIF ANKLE FRACTURE Left 11/27/2015   Procedure: OPEN REDUCTION INTERNAL FIXATION (ORIF) ANKLE FRACTURE;  Surgeon: [name], MD;  Location: [facility];  Service: Orthopedics;  Laterality: Left;   VENTRAL HERNIA REPAIR N/A 11/30/2016   Procedure: LAPAROSCOPIC VENTRAL HERNIA REPAIR AND  REPAIR OF SEROSAL TEAR;  Surgeon: [name], MD;  Location: [facility] OR;   Service: General;  Laterality: N/A;    Social History   Tobacco Use  Smoking Status Former   Current packs/day: 0.00   Average packs/day: 2.5 packs/day for 36.0 years (90.0 ttl pk-yrs)   Types: Cigarettes   Start date: 08/22/1971   Quit date: 08/22/2007   Years since quitting: 15.3  Smokeless Tobacco Never    Social History   Substance and Sexual Activity  Alcohol Use Yes   Alcohol/week: 6.0 standard drinks of alcohol   Types: 6 Cans of beer per week    Family History  Problem Relation Age of Onset   Heart disease Mother    Hypertension Father    Prostate cancer Brother    CAD Brother     Reviw of Systems:  Reviewed in the HPI.  All other systems are negative.     EKG:    EKG Interpretation Date/Time:  Monday December 28 2022 14:16:26 EDT Ventricular Rate:  69 PR Interval:  198 QRS Duration:  102 QT Interval:  422 QTC Calculation: 452 R Axis:   -65  Text Interpretation: Normal sinus rhythm Left axis deviation Incomplete right bundle branch block Inferior infarct (cited on or before 22-Jun-2016) Anterior infarct , age undetermined When compared with ECG of 05-Dec-2016 10:29, QRS axis Shifted left Anterior infarct is now Present Confirmed by Kristeen Miss ([id number]) on 12/28/2022 2:22:14 PM      Assessment / Plan:   .1. Coronary artery  disease-    Hx of inferior MI in 2009 . S/p stenting.     No angina   2. Hyperlipidemia-    check lipids today      3. Sleep apnea-  per primary MD    4. Hypertension -     BP is well controlled.    Cont current meds.   Advised weight loss   5.  ED :            Kristeen Miss, MD  12/28/2022 2:26 PM    [facility] [address] [place],  [address] [place], [place]  [address] Pager [phone] Phone: [phone]; Fax: [phone]

## 2022-12-28 ENCOUNTER — Encounter: Payer: Self-pay | Admitting: Cardiovascular Disease

## 2022-12-28 ENCOUNTER — Ambulatory Visit: Payer: Medicare PPO | Attending: Nurse Practitioner | Admitting: Cardiovascular Disease

## 2022-12-28 VITALS — BP 138/86 | HR 68 | Ht 69.0 in | Wt 221.0 lb

## 2022-12-28 DIAGNOSIS — I10 Essential (primary) hypertension: Secondary | ICD-10-CM

## 2022-12-28 DIAGNOSIS — I25118 Atherosclerotic heart disease of native coronary artery with other forms of angina pectoris: Secondary | ICD-10-CM | POA: Diagnosis not present

## 2022-12-28 DIAGNOSIS — Z09 Encounter for follow-up examination after completed treatment for conditions other than malignant neoplasm: Secondary | ICD-10-CM | POA: Diagnosis not present

## 2022-12-28 DIAGNOSIS — E782 Mixed hyperlipidemia: Secondary | ICD-10-CM | POA: Diagnosis not present

## 2022-12-28 NOTE — Patient Instructions (Signed)
Medication Instructions:  Your physician recommends that you continue on your current medications as directed. Please refer to the Current Medication list given to you today.  *If you need a refill on your cardiac medications before your next appointment, please call your pharmacy*   Lab Work: Lab work to be done today--Lipids, ALT, BMP If you have labs (blood work) drawn today and your tests are completely normal, you will receive your results only by: MyChart Message (if you have MyChart) OR A paper copy in the mail If you have any lab test that is abnormal or we need to change your treatment, we will call you to review the results.   Testing/Procedures: none   Follow-Up: At [facility], you and your health needs are our priority.  As part of our continuing mission to provide you with exceptional heart care, we have created designated Provider Care Teams.  These Care Teams include your primary Cardiologist (physician) and Advanced Practice Providers (APPs -  Physician Assistants and Nurse Practitioners) who all work together to provide you with the care you need, when you need it.  We recommend signing up for the patient portal called "MyChart".  Sign up information is provided on this After Visit Summary.  MyChart is used to connect with patients for Virtual Visits (Telemedicine).  Patients are able to view lab/test results, encounter notes, upcoming appointments, etc.  Non-urgent messages can be sent to your provider as well.   To learn more about what you can do with MyChart, go to [email or web address].    Your next appointment:   12 month(s)  Provider:   Dr Clifton James     Other Instructions

## 2022-12-29 LAB — LIPID PANEL
Chol/HDL Ratio: 3.2 {ratio} (ref 0.0–5.0)
Cholesterol, Total: 110 mg/dL (ref 100–199)
HDL: 34 mg/dL — ABNORMAL LOW (ref >39)
LDL Chol Calc (NIH): 57 mg/dL (ref 0–99)
Triglycerides: 101 mg/dL (ref 0–149)
VLDL Cholesterol Cal: 19 mg/dL (ref 5–40)

## 2022-12-29 LAB — BASIC METABOLIC PANEL
BUN/Creatinine Ratio: 9 — ABNORMAL LOW (ref 10–24)
BUN: 9 mg/dL (ref 8–27)
CO2: 27 mmol/L (ref 20–29)
Calcium: 10 mg/dL (ref 8.6–10.2)
Chloride: 100 mmol/L (ref 96–106)
Creatinine, Ser: 1.04 mg/dL (ref 0.76–1.27)
Glucose: 102 mg/dL — ABNORMAL HIGH (ref 70–99)
Potassium: 4.1 mmol/L (ref 3.5–5.2)
Sodium: 142 mmol/L (ref 134–144)
eGFR: 79 mL/min/{1.73_m2} (ref >59)

## 2022-12-29 LAB — ALT: ALT: 37 [IU]/L (ref 0–44)

## 2023-01-02 ENCOUNTER — Other Ambulatory Visit: Payer: Self-pay | Admitting: Family Medicine

## 2023-01-08 ENCOUNTER — Other Ambulatory Visit: Payer: Self-pay | Admitting: Cardiovascular Disease

## 2023-02-17 ENCOUNTER — Other Ambulatory Visit: Payer: Self-pay | Admitting: Family Medicine

## 2023-02-17 DIAGNOSIS — R350 Frequency of micturition: Secondary | ICD-10-CM

## 2023-02-17 DIAGNOSIS — E1142 Type 2 diabetes mellitus with diabetic polyneuropathy: Secondary | ICD-10-CM

## 2023-02-22 NOTE — Telephone Encounter (Signed)
Refill request  gabapentin (NEURONTIN) 300 MG capsule [[id number]]   [id number] [place], [place] - [address] [place], [place] [address] Phone: [phone]  Fax: [phone]

## 2023-04-19 ENCOUNTER — Ambulatory Visit (INDEPENDENT_AMBULATORY_CARE_PROVIDER_SITE_OTHER): Payer: Self-pay

## 2023-04-19 DIAGNOSIS — Z0000 Encounter for general adult medical examination without abnormal findings: Secondary | ICD-10-CM

## 2023-04-19 NOTE — Patient Instructions (Signed)
Chris [name] , Thank you for taking time to come for your Medicare Wellness Visit. I appreciate your ongoing commitment to your health goals. Please review the following plan we discussed and let me know if I can assist you in the future.   Referrals/Orders/Follow-Ups/Clinician Recommendations: none  This is a list of the screening recommended for you and due dates:  Health Maintenance  Topic Date Due   Zoster (Shingles) Vaccine (1 of 2) Never done   COVID-19 Vaccine (1 - 2024-25 season) Never done   Yearly kidney health urinalysis for diabetes  12/11/2022   Hemoglobin A1C  02/11/2023   Complete foot exam   04/16/2023   Flu Shot  06/21/2023*   Eye exam for diabetics  07/21/2023   Yearly kidney function blood test for diabetes  12/28/2023   DTaP/Tdap/Td vaccine (3 - Td or Tdap) 08/11/2025   Colon Cancer Screening  03/23/2030   Pneumonia Vaccine  Completed   Hepatitis C Screening  Completed   HPV Vaccine  Aged Out   Screening for Lung Cancer  Discontinued  *Topic was postponed. The date shown is not the original due date.    Advanced directives: (Copy Requested) Please bring a copy of your health care power of attorney and living will to the office to be added to your chart at your convenience.  Next Medicare Annual Wellness Visit scheduled for next year: Yes  insert Preventive Care attachment Insert FALL PREVENTION attachment if needed

## 2023-04-19 NOTE — Progress Notes (Signed)
Subjective:   Chris [name] is a 68 y.o. male who presents for Medicare Annual/Subsequent preventive examination.  Visit Complete: Virtual I connected with  [name] on 04/19/23 by a audio enabled telemedicine application and verified that I am speaking with the correct person using two identifiers.  Interactive audio and video telecommunications were attempted between this provider and patient, however failed, due to patient having technical difficulties OR patient did not have access to video capability.  We continued and completed visit with audio only.  Patient Location: Home  Provider Location: Office/Clinic  I discussed the limitations of evaluation and management by telemedicine. The patient expressed understanding and agreed to proceed.  Vital Signs: Because this visit was a virtual/telehealth visit, some criteria may be missing or patient reported. Any vitals not documented were not able to be obtained and vitals that have been documented are patient reported.    Cardiac Risk Factors include: advanced age (>24men, >44 women)     Objective:    Today's Vitals   04/19/23 1011  PainSc: 3    There is no height or weight on file to calculate BMI.     04/19/2023   10:18 AM 04/13/2022    9:36 AM 12/05/2016    4:45 PM 12/05/2016   10:31 AM 11/30/2016    3:11 PM 11/27/2016    2:21 PM 09/17/2016    1:54 PM  Advanced Directives  Does Patient Have a Medical Advance Directive? Yes Yes No No No No No  Type of Advance Directive Living will Healthcare Power of [place];Living will       Does patient want to make changes to medical advance directive?  Yes (Inpatient - patient defers changing a medical advance directive and declines information at this time)       Copy of Healthcare Power of Attorney in Chart?  No - copy requested       Would patient like information on creating a medical advance directive?   No - Patient declined  No - Patient declined No - Patient declined No -  Patient declined    Current Medications (verified) Outpatient Encounter Medications as of 04/19/2023  Medication Sig   allopurinol (ZYLOPRIM) 300 MG tablet Take 300 mg by mouth daily.   Apple Cider Vinegar-Ginger 500-5 MG CHEW Chew by mouth.   aspirin 81 MG tablet Take 81 mg by mouth daily.   atorvastatin (LIPITOR) 40 MG tablet TAKE 1 TABLET BY MOUTH EVERY DAY   chlorthalidone (HYGROTON) 25 MG tablet Take 1 tablet (25 mg total) by mouth daily.   clopidogrel (PLAVIX) 75 MG tablet TAKE 1 TABLET BY MOUTH EVERY DAY   fenofibrate micronized (LOFIBRA) 134 MG capsule TAKE 1 CAPSULE BY MOUTH DAILY.   fluticasone (FLONASE) 50 MCG/ACT nasal spray PLACE 1 SPRAY INTO BOTH NOSTRILS 2 (TWO) TIMES DAILY   gabapentin (NEURONTIN) 300 MG capsule TAKE 1-2 CAPSULES (300-600 MG TOTAL) BY MOUTH AT BEDTIME.   levocetirizine (XYZAL) 5 MG tablet Take 5 mg by mouth at bedtime as needed for allergies.   loperamide (IMODIUM) 2 MG capsule Take by mouth as needed for diarrhea or loose stools.   metFORMIN (GLUCOPHAGE) 1000 MG tablet Take 1 tablet (1,000 mg total) by mouth 2 (two) times daily with a meal.   metoprolol succinate (TOPROL-XL) 25 MG 24 hr tablet TAKE 1/2 TABLET TWICE A DAY BY MOUTH   multivitamin-iron-minerals-folic acid (CENTRUM) chewable tablet Chew 1 tablet by mouth daily.   Omega-3 Fatty Acids (FISH OIL) 1200 MG  CAPS Take 2 capsules by mouth 2 (two) times daily.   pantoprazole (PROTONIX) 40 MG tablet TAKE 1 TABLET BY MOUTH EVERY DAY   tamsulosin (FLOMAX) 0.4 MG CAPS capsule TAKE 1 CAPSULE BY MOUTH EVERY DAY   zinc gluconate 50 MG tablet Take 50 mg by mouth daily.   nitroGLYCERIN (NITROSTAT) 0.4 MG SL tablet Place 1 tablet (0.4 mg total) under the tongue every 5 (five) minutes as needed for chest pain.   No facility-administered encounter medications on file as of 04/19/2023.    Allergies (verified) Ace inhibitors and Ropinirole hcl   History: Past Medical History:  Diagnosis Date   Acute MI,  inferior wall, initial episode of care [facility]) 2009   Allergy    Anemia    Arthritis    Coronary atherosclerosis of unspecified type of vessel, native or graft    GERD (gastroesophageal reflux disease)    High cholesterol    Hypertension    Seasonal allergies    Sleep apnea    "I've never worn a mask" (09/17/2016)   Past Surgical History:  Procedure Laterality Date   APPENDECTOMY     CHOLECYSTECTOMY OPEN  ~ 2008   "it exploded"   CORONARY ANGIOPLASTY WITH STENT PLACEMENT  2009    2.75 x 28 mm Promus stent to the RCA   FRACTURE SURGERY     HERNIA REPAIR     INSERTION OF MESH N/A 11/30/2016   Procedure: INSERTION OF MESH;  Surgeon: [name], MD;  Location: [facility] OR;  Service: General;  Laterality: N/A;   LAPAROSCOPIC APPENDECTOMY N/A 09/16/2016   Procedure: APPENDECTOMY LAPAROSCOPIC;  Surgeon: [name], MD;  Location: [facility] OR;  Service: General;  Laterality: N/A;   LAPAROSCOPIC INCISIONAL / UMBILICAL / VENTRAL HERNIA REPAIR  11/30/2016   w/repair serosal tear   ORIF ANKLE FRACTURE Left 11/27/2015   Procedure: OPEN REDUCTION INTERNAL FIXATION (ORIF) ANKLE FRACTURE;  Surgeon: [name], MD;  Location: [facility];  Service: Orthopedics;  Laterality: Left;   VENTRAL HERNIA REPAIR N/A 11/30/2016   Procedure: LAPAROSCOPIC VENTRAL HERNIA REPAIR AND  REPAIR OF SEROSAL TEAR;  Surgeon: [name], MD;  Location: [facility] OR;  Service: General;  Laterality: N/A;   Family History  Problem Relation Age of Onset   Heart disease Mother    Hypertension Father    Prostate cancer Brother    CAD Brother    Social History   Socioeconomic History   Marital status: Married    Spouse name: Not on file   Number of children: Not on file   Years of education: Not on file   Highest education level: Not on file  Occupational History   Occupation: Production designer, theatre/television/film - [employer]  Tobacco Use   Smoking status: Former    Current packs/day: 0.00    Average packs/day: 2.5 packs/day for  36.0 years (90.0 ttl pk-yrs)    Types: Cigarettes    Start date: 08/22/1971    Quit date: 08/22/2007    Years since quitting: 15.6   Smokeless tobacco: Never  Vaping Use   Vaping status: Never Used  Substance and Sexual Activity   Alcohol use: Yes    Alcohol/week: 6.0 standard drinks of alcohol    Types: 6 Cans of beer per week   Drug use: Never    Types: Amphetamines   Sexual activity: Yes  Other Topics Concern   Not on file  Social History Narrative   Lives with wife. Exercises occasionally.   Social Drivers  of Health   Financial Resource Strain: Low Risk  (04/19/2023)   Overall Financial Resource Strain (CARDIA)    Difficulty of Paying Living Expenses: Not hard at all  Food Insecurity: No Food Insecurity (04/19/2023)   Hunger Vital Sign    Worried About Running Out of Food in the Last Year: Never true    Ran Out of Food in the Last Year: Never true  Transportation Needs: No Transportation Needs (04/19/2023)   PRAPARE - Administrator, Civil Service (Medical): No    Lack of Transportation (Non-Medical): No  Physical Activity: Sufficiently Active (04/19/2023)   Exercise Vital Sign    Days of Exercise per Week: 4 days    Minutes of Exercise per Session: 60 min  Stress: No Stress Concern Present (04/19/2023)   [employer] of Occupational Health - Occupational Stress Questionnaire    Feeling of Stress : Not at all  Social Connections: Moderately Integrated (04/19/2023)   Social Connection and Isolation Panel [NHANES]    Frequency of Communication with Friends and Family: More than three times a week    Frequency of Social Gatherings with Friends and Family: More than three times a week    Attends Religious Services: More than 4 times per year    Active Member of [employer] or Organizations: No    Attends Engineer, structural: Not on file    Marital Status: Married    Tobacco Counseling Counseling given: Not Answered   Clinical Intake:  Pre-visit  preparation completed: Yes  Pain : 0-10 Pain Score: 3  Pain Type: Chronic pain Pain Location: Back Pain Orientation: Lower Pain Descriptors / Indicators: Aching Pain Onset: More than a month ago Pain Frequency: Constant     Nutritional Risks: Nausea/ vomitting/ diarrhea (diarrhea getting better) Diabetes: Yes CBG done?: No Did pt. bring in CBG monitor from home?: No  How often do you need to have someone help you when you read instructions, pamphlets, or other written materials from your doctor or pharmacy?: 1 - Never  Interpreter Needed?: No  Information entered by :: NAllen LPN   Activities of Daily Living    04/19/2023   10:13 AM  In your present state of health, do you have any difficulty performing the following activities:  Hearing? 0  Vision? 0  Difficulty concentrating or making decisions? 0  Walking or climbing stairs? 0  Dressing or bathing? 0  Doing errands, shopping? 0  Preparing Food and eating ? N  Using the Toilet? N  In the past six months, have you accidently leaked urine? N  Do you have problems with loss of bowel control? N  Managing your Medications? N  Managing your Finances? N  Housekeeping or managing your Housekeeping? N    Patient Care Team: Loyola Mast, MD as PCP - General (Family Medicine) [name], MD as Consulting Physician (General Surgery) [name], MD as Consulting Physician (Orthopedic Surgery) Rennis Chris, MD as Consulting Physician (Ophthalmology) Elinor Parkinson, DPM as Consulting Physician (Podiatry) Nahser, Deloris Ping, MD as Consulting Physician (Cardiology)  Indicate any recent Medical Services you may have received from other than [facility] providers in the past year (date may be approximate).     Assessment:   This is a routine wellness examination for Chris [name].  Hearing/Vision screen Hearing Screening - Comments:: Denies hearing issues Vision Screening - Comments:: Regular eye exams, [facility]   Goals  Addressed  This Visit's Progress    Patient Stated       04/18/2022, wants to eat healthier and drink more water, wants to lose 40 pounds       Depression Screen    04/19/2023   10:21 AM 04/13/2022    9:32 AM 11/04/2021    1:50 PM 06/28/2020    3:07 PM 10/05/2018   10:01 AM 08/24/2017    5:38 PM 06/28/2017   11:59 AM  PHQ 2/9 Scores  PHQ - 2 Score 0 0 0 0 0 0 0  PHQ- 9 Score   1        Fall Risk    04/19/2023   10:19 AM 04/13/2022    9:27 AM 11/04/2021    1:12 PM 10/09/2019    9:06 AM 04/10/2019    8:09 AM  Fall Risk   Falls in the past year? 0 1 1 0 0  Number falls in past yr: 0 0 0  0  Injury with Fall? 0 1 1 0 0  Risk for fall due to : Medication side effect No Fall Risks     Follow up Falls prevention discussed;Falls evaluation completed Education provided;Falls prevention discussed       MEDICARE RISK AT HOME: Medicare Risk at Home Any stairs in or around the home?: No If so, are there any without handrails?: No Home free of loose throw rugs in walkways, pet beds, electrical cords, etc?: Yes Adequate lighting in your home to reduce risk of falls?: Yes Life alert?: No Use of a cane, walker or w/c?: No Grab bars in the bathroom?: No Shower chair or bench in shower?: No Elevated toilet seat or a handicapped toilet?: No  TIMED UP AND GO:  Was the test performed?  No    Cognitive Function:        04/19/2023   10:21 AM 04/13/2022    9:42 AM  6CIT Screen  What Year? 0 points 0 points  What month? 0 points 0 points  What time? 0 points 0 points  Count back from 20 0 points 0 points  Months in reverse 0 points 0 points  Repeat phrase 2 points 2 points  Total Score 2 points 2 points    Immunizations Immunization History  Administered Date(s) Administered   Fluad Quad(high Dose 65+) 03/20/2019   Influenza Inj Mdck Quad Pf 01/23/2018   Influenza,inj,Quad PF,6+ Mos 04/03/2013, 12/25/2016   Influenza,inj,quad, With Preservative 01/23/2018    Influenza-Unspecified 02/20/2014   PNEUMOCOCCAL CONJUGATE-20 12/10/2021   Pneumococcal Conjugate-13 02/20/2014   Tdap 03/23/2005, 08/12/2015    TDAP status: Up to date  Flu Vaccine status: Declined, Education has been provided regarding the importance of this vaccine but patient still declined. Advised may receive this vaccine at local pharmacy or Health Dept. Aware to provide a copy of the vaccination record if obtained from local pharmacy or Health Dept. Verbalized acceptance and understanding.  Pneumococcal vaccine status: Up to date  Covid-19 vaccine status: Declined, Education has been provided regarding the importance of this vaccine but patient still declined. Advised may receive this vaccine at local pharmacy or Health Dept.or vaccine clinic. Aware to provide a copy of the vaccination record if obtained from local pharmacy or Health Dept. Verbalized acceptance and understanding.  Qualifies for Shingles Vaccine? Yes   Zostavax completed No   Shingrix Completed?: No.    Education has been provided regarding the importance of this vaccine. Patient has been advised to call insurance company to determine out of  pocket expense if they have not yet received this vaccine. Advised may also receive vaccine at local pharmacy or Health Dept. Verbalized acceptance and understanding.  Screening Tests Health Maintenance  Topic Date Due   Zoster Vaccines- Shingrix (1 of 2) Never done   COVID-19 Vaccine (1 - 2024-25 season) Never done   Diabetic kidney evaluation - Urine ACR  12/11/2022   HEMOGLOBIN A1C  02/11/2023   FOOT EXAM  04/16/2023   INFLUENZA VACCINE  06/21/2023 (Originally 10/22/2022)   OPHTHALMOLOGY EXAM  07/21/2023   Diabetic kidney evaluation - eGFR measurement  12/28/2023   DTaP/Tdap/Td (3 - Td or Tdap) 08/11/2025   Colonoscopy  03/23/2030   Pneumonia Vaccine 67+ Years old  Completed   Hepatitis C Screening  Completed   HPV VACCINES  Aged Out   Lung Cancer Screening  Discontinued     Health Maintenance  Health Maintenance Due  Topic Date Due   Zoster Vaccines- Shingrix (1 of 2) Never done   COVID-19 Vaccine (1 - 2024-25 season) Never done   Diabetic kidney evaluation - Urine ACR  12/11/2022   HEMOGLOBIN A1C  02/11/2023   FOOT EXAM  04/16/2023    Colorectal cancer screening: Type of screening: Colonoscopy. Completed 03/23/2020. Repeat every 5 years  Lung Cancer Screening: (Low Dose CT Chest recommended if Age 78-80 years, 20 pack-year currently smoking OR have quit w/in 15years.) does not qualify.   Lung Cancer Screening Referral: no  Additional Screening:  Hepatitis C Screening: does qualify; Completed 05/21/2014  Vision Screening: Recommended annual ophthalmology exams for early detection of glaucoma and other disorders of the eye. Is the patient up to date with their annual eye exam?  Yes  Who is the provider or what is the name of the office in which the patient attends annual eye exams? [facility] If pt is not established with a provider, would they like to be referred to a provider to establish care? No .   Dental Screening: Recommended annual dental exams for proper oral hygiene  Diabetic Foot Exam: Diabetic Foot Exam: Overdue, Pt has been advised about the importance in completing this exam. Pt is scheduled for diabetic foot exam on next appointment.  Community Resource Referral / Chronic Care Management: CRR required this visit?  No   CCM required this visit?  No     Plan:     I have personally reviewed and noted the following in the patient's chart:   Medical and social history Use of alcohol, tobacco or illicit drugs  Current medications and supplements including opioid prescriptions. Patient is not currently taking opioid prescriptions. Functional ability and status Nutritional status Physical activity Advanced directives List of other physicians Hospitalizations, surgeries, and ER visits in previous 12  months Vitals Screenings to include cognitive, depression, and falls Referrals and appointments  In addition, I have reviewed and discussed with patient certain preventive protocols, quality metrics, and best practice recommendations. A written personalized care plan for preventive services as well as general preventive health recommendations were provided to patient.     Barb Merino, LPN   06/29/8117   After Visit Summary: (MyChart) Due to this being a telephonic visit, the after visit summary with patients personalized plan was offered to patient via MyChart   Nurse Notes: none

## 2023-04-27 NOTE — Progress Notes (Unsigned)
LVM X 2 for updated insurance information

## 2023-04-30 ENCOUNTER — Encounter: Payer: Self-pay | Admitting: Family Medicine

## 2023-04-30 ENCOUNTER — Ambulatory Visit: Payer: PPO | Admitting: Family Medicine

## 2023-04-30 VITALS — BP 134/80 | HR 52 | Temp 98.8°F | Ht 69.0 in | Wt 219.8 lb

## 2023-04-30 DIAGNOSIS — E782 Mixed hyperlipidemia: Secondary | ICD-10-CM | POA: Diagnosis not present

## 2023-04-30 DIAGNOSIS — I10 Essential (primary) hypertension: Secondary | ICD-10-CM

## 2023-04-30 DIAGNOSIS — E1142 Type 2 diabetes mellitus with diabetic polyneuropathy: Secondary | ICD-10-CM | POA: Diagnosis not present

## 2023-04-30 DIAGNOSIS — J309 Allergic rhinitis, unspecified: Secondary | ICD-10-CM

## 2023-04-30 DIAGNOSIS — I25118 Atherosclerotic heart disease of native coronary artery with other forms of angina pectoris: Secondary | ICD-10-CM

## 2023-04-30 DIAGNOSIS — Z7984 Long term (current) use of oral hypoglycemic drugs: Secondary | ICD-10-CM | POA: Diagnosis not present

## 2023-04-30 LAB — URINALYSIS, ROUTINE W REFLEX MICROSCOPIC
Bilirubin Urine: NEGATIVE
Hgb urine dipstick: NEGATIVE
Ketones, ur: NEGATIVE
Leukocytes,Ua: NEGATIVE
Nitrite: NEGATIVE
RBC / HPF: NONE SEEN (ref 0–?)
Specific Gravity, Urine: 1.02 (ref 1.000–1.030)
Total Protein, Urine: NEGATIVE
Urine Glucose: NEGATIVE
Urobilinogen, UA: 1 (ref 0.0–1.0)
pH: 7 (ref 5.0–8.0)

## 2023-04-30 LAB — LIPID PANEL
Cholesterol: 114 mg/dL (ref 0–200)
HDL: 36.8 mg/dL — ABNORMAL LOW (ref >39.00)
LDL Cholesterol: 45 mg/dL (ref 0–99)
NonHDL: 77.2
Total CHOL/HDL Ratio: 3
Triglycerides: 163 mg/dL — ABNORMAL HIGH (ref 0.0–149.0)
VLDL: 32.6 mg/dL (ref 0.0–40.0)

## 2023-04-30 LAB — BASIC METABOLIC PANEL
BUN: 17 mg/dL (ref 6–23)
CO2: 28 meq/L (ref 19–32)
Calcium: 9.7 mg/dL (ref 8.4–10.5)
Chloride: 101 meq/L (ref 96–112)
Creatinine, Ser: 1.08 mg/dL (ref 0.40–1.50)
GFR: 70.94 mL/min (ref >60.00)
Glucose, Bld: 111 mg/dL — ABNORMAL HIGH (ref 70–99)
Potassium: 4.4 meq/L (ref 3.5–5.1)
Sodium: 141 meq/L (ref 135–145)

## 2023-04-30 LAB — HEMOGLOBIN A1C: Hgb A1c MFr Bld: 7.3 % — ABNORMAL HIGH (ref 4.6–6.5)

## 2023-04-30 MED ORDER — SITAGLIPTIN PHOSPHATE 50 MG PO TABS: 50.0000 mg | ORAL_TABLET | Freq: Every day | ORAL | 3 refills | Status: AC

## 2023-04-30 MED ORDER — FLUTICASONE PROPIONATE 50 MCG/ACT NA SUSP: 1.0000 | Freq: Two times a day (BID) | NASAL | 2 refills | Status: DC

## 2023-04-30 NOTE — Assessment & Plan Note (Signed)
Stable. Continue gabapentin 300 mg 1-2 at bedtime. Continue to work on improved blood sugar control.

## 2023-04-30 NOTE — Assessment & Plan Note (Signed)
Continue atorvastatin 40 mg daily, fenofibrate 134 mg daily, and fish oil

## 2023-04-30 NOTE — Assessment & Plan Note (Signed)
 No current chest pain. Continue aspirin  81 mg daily, Plavix  75 mg daily, metoprolol  succinate 25 mg daily, and NTG PRN.

## 2023-04-30 NOTE — Progress Notes (Signed)
 [facility] [address] [place] [address] Dept: [phone] Dept Fax: [phone]  Chronic Care Office Visit  Subjective:    Patient ID: Chris [name], male    DOB: 1955/11/29, 68 y.o..   MRN: [id number]  Chief Complaint  Patient presents with   Follow-up    3 month f/u.  C/o not having bowels moving very well.  Fasting today.    History of Present Illness:  Patient is in today for reassessment of chronic medical issues.  Mr. Bracamonte has a history of Type 2 diabetes complicated with neuropathy and retinopathy. He is managed on metformin  1000 mg twice daily. His neuropathy is managed with gabapentin  300 mg 1-2 at bedtime. He notes he has had an issue with loose stools much of the time. He does take a Metamucil supplement. His pharmacist told him he might need to stop the fiber supplement.   Mr. Mori has a past history of a myocardial infarction and is s/p placement of 2 cardiac stents. He is on an aspirin  81 mg daily, Plavix  75 mg daily, metoprolol  succinate 25 mg daily, and has NTG available. He also has hyperlipidemia and is treated with atorvastatin  40 mg daily, fenofibrate  134 mg daily, and fish oil.   Mr. Rewerts has a history of hypertension. He is managed on chlorthalidone  25 mg daily and metoprolol  succinate 25 mg daily.  Past Medical History: Patient Active Problem List   Diagnosis Date Noted   Essential hypertension 08/11/2022   Chronic pain of left ankle- s/p ORIF of ankle fracture with hardware 04/15/2022   Lower urinary tract symptoms (LUTS) 11/06/2021   Diabetic peripheral neuropathy (HCC) 06/28/2020   Diabetic retinopathy (HCC) 06/28/2020   Allergic rhinitis 06/28/2020   History of pulmonary embolus (PE) 06/28/2020   Type 2 diabetes mellitus with diabetic polyneuropathy, without long-term current use of insulin (HCC) 10/05/2018   Plantar fasciitis 10/05/2018   GERD (gastroesophageal reflux  disease) 12/05/2016   Ventral hernia without obstruction or gangrene 11/30/2016   History of MI (myocardial infarction) 05/21/2014   Erectile dysfunction 07/25/2012   Restless leg syndrome 02/20/2010   Hyperlipidemia 02/19/2010   Obstructive sleep apnea 02/19/2010   Coronary artery disease 02/19/2010   Past Surgical History:  Procedure Laterality Date   APPENDECTOMY     CHOLECYSTECTOMY OPEN  ~ 2008   it exploded   CORONARY ANGIOPLASTY WITH STENT PLACEMENT  2009    2.75 x 28 mm Promus stent to the RCA   FRACTURE SURGERY     HERNIA REPAIR     INSERTION OF MESH N/A 11/30/2016   Procedure: INSERTION OF MESH;  Surgeon: Kimble Agent, MD;  Location: [facility] OR;  Service: General;  Laterality: N/A;   LAPAROSCOPIC APPENDECTOMY N/A 09/16/2016   Procedure: APPENDECTOMY LAPAROSCOPIC;  Surgeon: Kimble Agent, MD;  Location: [facility] OR;  Service: General;  Laterality: N/A;   LAPAROSCOPIC INCISIONAL / UMBILICAL / VENTRAL HERNIA REPAIR  11/30/2016   w/repair serosal tear   ORIF ANKLE FRACTURE Left 11/27/2015   Procedure: OPEN REDUCTION INTERNAL FIXATION (ORIF) ANKLE FRACTURE;  Surgeon: Dempsey Sensor, MD;  Location: [facility];  Service: Orthopedics;  Laterality: Left;   VENTRAL HERNIA REPAIR N/A 11/30/2016   Procedure: LAPAROSCOPIC VENTRAL HERNIA REPAIR AND  REPAIR OF SEROSAL TEAR;  Surgeon: Kimble Agent, MD;  Location: [facility] OR;  Service: General;  Laterality: N/A;   Family History  Problem Relation Age of Onset   Heart disease Mother    Hypertension Father  Prostate cancer Brother    CAD Brother    Outpatient Medications Prior to Visit  Medication Sig Dispense Refill   allopurinol (ZYLOPRIM) 300 MG tablet Take 300 mg by mouth daily.     Apple Cider Vinegar-Ginger 500-5 MG CHEW Chew by mouth.     aspirin  81 MG tablet Take 81 mg by mouth daily.     atorvastatin  (LIPITOR) 40 MG tablet TAKE 1 TABLET BY MOUTH EVERY DAY 90 tablet 3   chlorthalidone  (HYGROTON ) 25 MG tablet Take 1 tablet (25 mg  total) by mouth daily. 90 tablet 3   clopidogrel  (PLAVIX ) 75 MG tablet TAKE 1 TABLET BY MOUTH EVERY DAY 90 tablet 3   fenofibrate  micronized (LOFIBRA) 134 MG capsule TAKE 1 CAPSULE BY MOUTH DAILY. 90 capsule 3   gabapentin  (NEURONTIN ) 300 MG capsule TAKE 1-2 CAPSULES (300-600 MG TOTAL) BY MOUTH AT BEDTIME. 180 capsule 1   levocetirizine (XYZAL ) 5 MG tablet Take 5 mg by mouth at bedtime as needed for allergies.     loperamide (IMODIUM) 2 MG capsule Take by mouth as needed for diarrhea or loose stools.     metFORMIN  (GLUCOPHAGE ) 1000 MG tablet Take 1 tablet (1,000 mg total) by mouth 2 (two) times daily with a meal. 180 tablet 3   metoprolol  succinate (TOPROL -XL) 25 MG 24 hr tablet TAKE 1/2 TABLET TWICE A DAY BY MOUTH 90 tablet 3   multivitamin-iron -minerals-folic acid (CENTRUM) chewable tablet Chew 1 tablet by mouth daily.     nitroGLYCERIN  (NITROSTAT ) 0.4 MG SL tablet Place 1 tablet (0.4 mg total) under the tongue every 5 (five) minutes as needed for chest pain. 100 tablet 1   Omega-3 Fatty Acids (FISH OIL) 1200 MG CAPS Take 2 capsules by mouth 2 (two) times daily.     pantoprazole  (PROTONIX ) 40 MG tablet TAKE 1 TABLET BY MOUTH EVERY DAY 90 tablet 3   tamsulosin  (FLOMAX ) 0.4 MG CAPS capsule TAKE 1 CAPSULE BY MOUTH EVERY DAY 90 capsule 3   zinc  gluconate 50 MG tablet Take 50 mg by mouth daily.     fluticasone  (FLONASE ) 50 MCG/ACT nasal spray PLACE 1 SPRAY INTO BOTH NOSTRILS 2 (TWO) TIMES DAILY 48 mL 2   No facility-administered medications prior to visit.   Allergies  Allergen Reactions   Ace Inhibitors Rash   Ropinirole Hcl Rash   Objective:   Today's Vitals   04/30/23 0838  BP: 134/80  Pulse: (!) 52  Temp: 98.8 F (37.1 C)  TempSrc: Temporal  SpO2: 100%  Weight: 219 lb 12.8 oz (99.7 kg)  Height: 5' 9 (1.753 m)   Body mass index is 32.46 kg/m.   General: Well developed, well nourished. No acute distress. Feet- Skin intact. No sign of maceration between toes. Nails are normal.  Dorsalis pedis and posterior tibial   artery pulses are normal. 5.07 monofilament testing normal. Psych: Alert and oriented. Normal mood and affect.  Health Maintenance Due  Topic Date Due   Zoster Vaccines- Shingrix (1 of 2) Never done   Diabetic kidney evaluation - Urine ACR  12/11/2022   HEMOGLOBIN A1C  02/11/2023     Assessment & Plan:   Problem List Items Addressed This Visit       Cardiovascular and Mediastinum   Coronary artery disease   No current chest pain. Continue aspirin  81 mg daily, Plavix  75 mg daily, metoprolol  succinate 25 mg daily, and NTG PRN.      Essential hypertension - Primary   BP is acceptable. Continue chlorthalidone  25  mg daily and metoprolol  succinate 25 mg daily. I will check annual renal labs today.        Respiratory   Allergic rhinitis   Stable. I will renew his fluticasone  spray.      Relevant Medications   fluticasone  (FLONASE ) 50 MCG/ACT nasal spray     Endocrine   Diabetic peripheral neuropathy (HCC)   Stable. Continue gabapentin  300 mg 1-2 at bedtime. Continue to work on improved blood sugar control.      Type 2 diabetes mellitus with diabetic polyneuropathy, without long-term current use of insulin (HCC)   We will check annual DM labs today. Cotninue metformin  1,000 mg bid. We did discuss that his loose stools may be due to his metformin . If reducing his Metamucil does not resolve this, we would consider switching his diabetes therapy.      Relevant Orders   Lipid panel   Microalbumin / creatinine urine ratio   Basic metabolic panel   Hemoglobin A1c   Urinalysis, Routine w reflex microscopic     Other   Hyperlipidemia   Continue atorvastatin  40 mg daily, fenofibrate  134 mg daily, and fish oil       Return in about 3 months (around 07/28/2023) for Reassessment.   Garnette CHRISTELLA Simpler, MD

## 2023-04-30 NOTE — Addendum Note (Signed)
 Addended by: Graig Lawyer on: 04/30/2023 03:35 PM   Modules accepted: Orders

## 2023-04-30 NOTE — Assessment & Plan Note (Signed)
 Stable. I will renew his fluticasone  spray.

## 2023-04-30 NOTE — Assessment & Plan Note (Signed)
 BP is acceptable. Continue chlorthalidone  25 mg daily and metoprolol  succinate 25 mg daily. I will check annual renal labs today.

## 2023-04-30 NOTE — Assessment & Plan Note (Signed)
 We will check annual DM labs today. Cotninue metformin  1,000 mg bid. We did discuss that his loose stools may be due to his metformin . If reducing his Metamucil does not resolve this, we would consider switching his diabetes therapy.

## 2023-05-06 LAB — MICROALBUMIN / CREATININE URINE RATIO
Creatinine,U: 121 mg/dL
Microalb Creat Ratio: 6 mg/g (ref 0.0–30.0)
Microalb, Ur: <0.7 mg/dL (ref 0.0–1.9)

## 2023-05-07 ENCOUNTER — Telehealth: Payer: Self-pay

## 2023-05-07 NOTE — Telephone Encounter (Signed)
Copied from CRM [id number]. Topic: Clinical - Medication Question >> May 07, 2023  4:41 PM Denese Killings wrote: Reason for CRM: Patient just picked up sitaGLIPtin (JANUVIA) 50 MG tablet from the pharmacy and wants to know if he has to take this medication and his Blood sugar medication at the same time.

## 2023-05-07 NOTE — Telephone Encounter (Signed)
Chris [name] called from [place] with updated Microalbumin results on patient. Please see updated results.

## 2023-05-10 ENCOUNTER — Telehealth: Payer: Self-pay | Admitting: Family Medicine

## 2023-05-10 DIAGNOSIS — E1142 Type 2 diabetes mellitus with diabetic polyneuropathy: Secondary | ICD-10-CM

## 2023-05-10 MED ORDER — METFORMIN HCL 1000 MG PO TABS: 1000.0000 mg | ORAL_TABLET | Freq: Two times a day (BID) | ORAL | 3 refills | Status: DC

## 2023-05-10 NOTE — Telephone Encounter (Signed)
 Patient reports being out of the Metformin 1000 mg.   Advised that he is to take both Metformin and Januvia.  Rx refill for the Metformin sent to [facility].  Dm/cma

## 2023-05-10 NOTE — Telephone Encounter (Signed)
 Please see other message.  Already taken care of. Dm/cma

## 2023-05-10 NOTE — Addendum Note (Signed)
 Addended by: Waymond Cera on: 05/10/2023 11:47 AM   Modules accepted: Orders

## 2023-05-10 NOTE — Telephone Encounter (Signed)
 Copied from CRM [id number]. Topic: Clinical - Medication Question >> May 10, 2023 10:27 AM Dennison Nancy wrote: Reason for CRM: Patient seen Dr. Veto Kemps on 04/30/23  was prescribe a medication for his sugar the januvia 50mg   and need advice patient is already on metFORMIN 1000 mg take twice a day and now taking a new medication Januvia 50mg  do patient suppose to take all the medication seems like a lot of medication to be taking at one time  Please call patient

## 2023-05-27 ENCOUNTER — Other Ambulatory Visit: Payer: Self-pay | Admitting: Family Medicine

## 2023-05-27 DIAGNOSIS — I10 Essential (primary) hypertension: Secondary | ICD-10-CM

## 2023-06-22 DIAGNOSIS — E1169 Type 2 diabetes mellitus with other specified complication: Secondary | ICD-10-CM | POA: Diagnosis not present

## 2023-06-22 DIAGNOSIS — I2510 Atherosclerotic heart disease of native coronary artery without angina pectoris: Secondary | ICD-10-CM | POA: Diagnosis not present

## 2023-06-22 DIAGNOSIS — E1142 Type 2 diabetes mellitus with diabetic polyneuropathy: Secondary | ICD-10-CM | POA: Diagnosis not present

## 2023-06-22 DIAGNOSIS — E785 Hyperlipidemia, unspecified: Secondary | ICD-10-CM | POA: Diagnosis not present

## 2023-06-22 DIAGNOSIS — K219 Gastro-esophageal reflux disease without esophagitis: Secondary | ICD-10-CM | POA: Diagnosis not present

## 2023-06-22 DIAGNOSIS — N400 Enlarged prostate without lower urinary tract symptoms: Secondary | ICD-10-CM | POA: Diagnosis not present

## 2023-06-22 DIAGNOSIS — I10 Essential (primary) hypertension: Secondary | ICD-10-CM | POA: Diagnosis not present

## 2023-06-22 DIAGNOSIS — G8929 Other chronic pain: Secondary | ICD-10-CM | POA: Diagnosis not present

## 2023-06-22 DIAGNOSIS — I252 Old myocardial infarction: Secondary | ICD-10-CM | POA: Diagnosis not present

## 2023-06-22 DIAGNOSIS — J309 Allergic rhinitis, unspecified: Secondary | ICD-10-CM | POA: Diagnosis not present

## 2023-06-22 DIAGNOSIS — Z7902 Long term (current) use of antithrombotics/antiplatelets: Secondary | ICD-10-CM | POA: Diagnosis not present

## 2023-06-22 DIAGNOSIS — E669 Obesity, unspecified: Secondary | ICD-10-CM | POA: Diagnosis not present

## 2023-06-28 ENCOUNTER — Ambulatory Visit: Payer: Self-pay

## 2023-06-28 ENCOUNTER — Encounter: Payer: Self-pay | Admitting: Family Medicine

## 2023-06-28 ENCOUNTER — Ambulatory Visit (INDEPENDENT_AMBULATORY_CARE_PROVIDER_SITE_OTHER): Admitting: Family Medicine

## 2023-06-28 VITALS — BP 130/78 | HR 74 | Temp 97.9°F | Wt 212.6 lb

## 2023-06-28 DIAGNOSIS — Z86711 Personal history of pulmonary embolism: Secondary | ICD-10-CM | POA: Diagnosis not present

## 2023-06-28 DIAGNOSIS — M7989 Other specified soft tissue disorders: Secondary | ICD-10-CM | POA: Insufficient documentation

## 2023-06-28 NOTE — Telephone Encounter (Signed)
 Chief Complaint: Right leg swelling Symptoms: right knee pain, right leg swelling from knee down Frequency: knee pain for 1 week, swelling since this morning Pertinent Negatives: Patient denies chest pain, difficulty breathing, redness in calf Disposition: [] ED /[] Urgent Care (no appt availability in office) / [x] Appointment(In office/virtual)/ []  [facility]/ [] Home Care/ [] Refused Recommended Disposition /[] [facility]/ []  Follow-up with PCP Additional Notes: Patient called in stating he has had pain in his right knee for one week, so last night he put a knee brace on it and woke up this morning and noticed his right ankle and lower leg is moderately swollen. Patient denies history of heart failure or kidney failure but state she has a hx of blood clot in his left leg that went to his lungs. Patient is uncertain if this is a gout flare up, due to knee brace, or blood clot. Patient appt made for today for evaluation.    Copied from CRM [id number]. Topic: Clinical - Red Word Triage >> Jun 28, 2023 12:37 PM Chris [name] wrote: Red Word that prompted transfer to Nurse Triage: Knees bothering patient. Right ankle swollen. Reason for Disposition  [1] Thigh, calf, or ankle swelling AND [2] only 1 side  Answer Assessment - Initial Assessment Questions 1. ONSET: "When did the swelling start?" (e.g., minutes, hours, days)     Ankle swelling started today and noticed swelling down leg 2. LOCATION: "What part of the leg is swollen?"  "Are both legs swollen or just one leg?"     Right knee down to right ankle 3. SEVERITY: "How bad is the swelling?" (e.g., localized; mild, moderate, severe)   - Localized: Small area of swelling localized to one leg.   - MILD pedal edema: Swelling limited to foot and ankle, pitting edema < 1/4 inch (6 mm) deep, rest and elevation eliminate most or all swelling.   - MODERATE edema: Swelling of lower leg to knee, pitting edema > 1/4 inch (6 mm) deep, rest and  elevation only partially reduce swelling.   - SEVERE edema: Swelling extends above knee, facial or hand swelling present.      Mild 4. REDNESS: "Does the swelling look red or infected?"     Mild redness around ankle 5. PAIN: "Is the swelling painful to touch?" If Yes, ask: "How painful is it?"   (Scale 1-10; mild, moderate or severe)     Light pain in knee but no pain in ankle 6. FEVER: "Do you have a fever?" If Yes, ask: "What is it, how was it measured, and when did it start?"      Patient doesn't think he is running a fever 7. CAUSE: "What do you think is causing the leg swelling?"     Patient is unsure of cause 8. MEDICAL HISTORY: "Do you have a history of blood clots (e.g., DVT), cancer, heart failure, kidney disease, or liver failure?"     Gout 9. RECURRENT SYMPTOM: "Have you had leg swelling before?" If Yes, ask: "When was the last time?" "What happened that time?"     No 10. OTHER SYMPTOMS: "Do you have any other symptoms?" (e.g., chest pain, difficulty breathing)       No  Protocols used: Leg Swelling and Edema-A-AH

## 2023-06-28 NOTE — Patient Instructions (Signed)
 Your right leg swelling is concerning for a possible blood clot known as a deep vein thrombosis. You have had these in the past increase your risk. We are ordering an ultrasound of your right lower leg. The office will call to schedule this. If you develop symptoms of chest pain, shortness of breath or other worrisome symptoms, please go to the emergency department

## 2023-06-28 NOTE — Telephone Encounter (Signed)
 Noted. Patient scheduled for in office appointment with Dr. Janee Morn for today June 28, 2023.

## 2023-06-28 NOTE — Progress Notes (Signed)
 Assessment/Plan:    Assessment & Plan Right leg swelling Acute swelling of the right leg with possible bruise and no known trauma. Previous DVT in 2018 with pulmonary embolism raises concern for recurrence. No current chest pain or dyspnea. Doppler ultrasound planned to assess for DVT. If negative, alternative causes for swelling will be explored. Aim to avoid emergency department visits unless symptoms worsen. - Order stat Doppler ultrasound of the right lower extremity to assess for DVT - Advise to seek emergency care if chest pain, dyspnea, or increased leg pain/swelling occurs before ultrasound results are available  General Health Maintenance Actively losing weight through exercise, including walking and sports, with some weight loss since last visit.      There are no discontinued medications.  Return if symptoms worsen or fail to improve.    Subjective:   Encounter date: 06/28/2023  Chris [name] is a 68 y.o. male who has Hyperlipidemia; Obstructive sleep apnea; Coronary artery disease; Restless leg syndrome; Erectile dysfunction; History of MI (myocardial infarction); Ventral hernia without obstruction or gangrene; GERD (gastroesophageal reflux disease); Type 2 diabetes mellitus with diabetic polyneuropathy, without long-term current use of insulin (HCC); Plantar fasciitis; Diabetic peripheral neuropathy (HCC); Diabetic retinopathy (HCC); Allergic rhinitis; History of pulmonary embolus (PE); Lower urinary tract symptoms (LUTS); Chronic pain of left ankle- s/p ORIF of ankle fracture with hardware; Essential hypertension; and Right leg swelling on their problem list..   He  has a past medical history of Acute MI, inferior wall, initial episode of care [facility]) (2009), Allergy, Anemia, Arthritis, Coronary atherosclerosis of unspecified type of vessel, native or graft, GERD (gastroesophageal reflux disease), High cholesterol, Hypertension, Seasonal allergies, and Sleep apnea.[name]    He presents with chief complaint of Leg Swelling (right knee pain, right leg swelling from knee down x  1 week .  Right Ankle swelling tx with omega xl inflammation  supplement starting today.) .   Discussed the use of AI scribe software for clinical note transcription with the patient, who gave verbal consent to proceed.  History of Present Illness The patient is a 68 year old with a history of DVT who presents with right leg swelling.  He has experienced sudden swelling in the right leg since this morning, localized to the right ankle with a bruise-like appearance. There is soreness upon applying pressure, but no significant pain with movement. No known trauma or falls. No chest pain or shortness of breath.  He has a history of deep vein thrombosis (DVT) in 2018, which affected the left leg and resulted in a pulmonary embolism. He is currently taking Plavix and a baby aspirin but is not on any other blood thinners. He recalls being on a different blood thinner for six months following the previous DVT.  He experiences diabetic neuropathy, causing significant discomfort, particularly at night, with some relief from soaking in a hot tub. He has dealt with neuropathy since he was 68 or 68 years old, and symptoms are now occurring during the daytime as well.  He engages in physical activities such as playing golf and basketball, and walking on a cement floor at the [facility] to lose weight. His knees have been bothering him, and he sometimes feels like his knee is 'giving out'. Despite these activities, he denies any direct injury to the leg.       Past Surgical History:  Procedure Laterality Date   APPENDECTOMY     CHOLECYSTECTOMY OPEN  ~ 2008   "it exploded"   CORONARY ANGIOPLASTY  WITH STENT PLACEMENT  2009    2.75 x 28 mm Promus stent to the RCA   FRACTURE SURGERY     HERNIA REPAIR     INSERTION OF MESH N/A 11/30/2016   Procedure: INSERTION OF MESH;  Surgeon: [name], MD;   Location: [facility] OR;  Service: General;  Laterality: N/A;   LAPAROSCOPIC APPENDECTOMY N/A 09/16/2016   Procedure: APPENDECTOMY LAPAROSCOPIC;  Surgeon: [name], MD;  Location: [facility] OR;  Service: General;  Laterality: N/A;   LAPAROSCOPIC INCISIONAL / UMBILICAL / VENTRAL HERNIA REPAIR  11/30/2016   w/repair serosal tear   ORIF ANKLE FRACTURE Left 11/27/2015   Procedure: OPEN REDUCTION INTERNAL FIXATION (ORIF) ANKLE FRACTURE;  Surgeon: [name], MD;  Location: [facility];  Service: Orthopedics;  Laterality: Left;   VENTRAL HERNIA REPAIR N/A 11/30/2016   Procedure: LAPAROSCOPIC VENTRAL HERNIA REPAIR AND  REPAIR OF SEROSAL TEAR;  Surgeon: [name], MD;  Location: [facility] OR;  Service: General;  Laterality: N/A;    Outpatient Medications Prior to Visit  Medication Sig Dispense Refill   allopurinol (ZYLOPRIM) 300 MG tablet Take 300 mg by mouth daily.     Apple Cider Vinegar-Ginger 500-5 MG CHEW Chew by mouth.     aspirin 81 MG tablet Take 81 mg by mouth daily.     atorvastatin (LIPITOR) 40 MG tablet TAKE 1 TABLET BY MOUTH EVERY DAY 90 tablet 3   chlorthalidone (HYGROTON) 25 MG tablet TAKE 1 TABLET (25 MG TOTAL) BY MOUTH DAILY. 90 tablet 0   clopidogrel (PLAVIX) 75 MG tablet TAKE 1 TABLET BY MOUTH EVERY DAY 90 tablet 3   fenofibrate micronized (LOFIBRA) 134 MG capsule TAKE 1 CAPSULE BY MOUTH DAILY. 90 capsule 3   fluticasone (FLONASE) 50 MCG/ACT nasal spray Place 1 spray into both nostrils 2 (two) times daily. 48 mL 2   gabapentin (NEURONTIN) 300 MG capsule TAKE 1-2 CAPSULES (300-600 MG TOTAL) BY MOUTH AT BEDTIME. 180 capsule 1   levocetirizine (XYZAL) 5 MG tablet Take 5 mg by mouth at bedtime as needed for allergies.     loperamide (IMODIUM) 2 MG capsule Take by mouth as needed for diarrhea or loose stools.     metFORMIN (GLUCOPHAGE) 1000 MG tablet Take 1 tablet (1,000 mg total) by mouth 2 (two) times daily with a meal. 180 tablet 3   metoprolol succinate (TOPROL-XL) 25 MG 24 hr tablet  TAKE 1/2 TABLET TWICE A DAY BY MOUTH 90 tablet 3   multivitamin-iron-minerals-folic acid (CENTRUM) chewable tablet Chew 1 tablet by mouth daily.     Omega-3 Fatty Acids (FISH OIL) 1200 MG CAPS Take 2 capsules by mouth 2 (two) times daily.     pantoprazole (PROTONIX) 40 MG tablet TAKE 1 TABLET BY MOUTH EVERY DAY 90 tablet 3   sitaGLIPtin (JANUVIA) 50 MG tablet Take 1 tablet (50 mg total) by mouth daily. 90 tablet 3   tamsulosin (FLOMAX) 0.4 MG CAPS capsule TAKE 1 CAPSULE BY MOUTH EVERY DAY 90 capsule 3   zinc gluconate 50 MG tablet Take 50 mg by mouth daily.     nitroGLYCERIN (NITROSTAT) 0.4 MG SL tablet Place 1 tablet (0.4 mg total) under the tongue every 5 (five) minutes as needed for chest pain. 100 tablet 1   No facility-administered medications prior to visit.    Family History  Problem Relation Age of Onset   Heart disease Mother    Hypertension Father    Prostate cancer Brother    CAD Brother  Social History   Socioeconomic History   Marital status: Married    Spouse name: Not on file   Number of children: Not on file   Years of education: Not on file   Highest education level: Not on file  Occupational History   Occupation: Production designer, theatre/television/film - [employer]  Tobacco Use   Smoking status: Former    Current packs/day: 0.00    Average packs/day: 2.5 packs/day for 36.0 years (90.0 ttl pk-yrs)    Types: Cigarettes    Start date: 08/22/1971    Quit date: 08/22/2007    Years since quitting: 15.8   Smokeless tobacco: Never  Vaping Use   Vaping status: Never Used  Substance and Sexual Activity   Alcohol use: Yes    Alcohol/week: 6.0 standard drinks of alcohol    Types: 6 Cans of beer per week   Drug use: Never    Types: Amphetamines   Sexual activity: Yes  Other Topics Concern   Not on file  Social History Narrative   Lives with wife. Exercises occasionally.   Social Drivers of Corporate investment banker Strain: Low Risk  (04/19/2023)   Overall Financial Resource  Strain (CARDIA)    Difficulty of Paying Living Expenses: Not hard at all  Food Insecurity: No Food Insecurity (04/19/2023)   Hunger Vital Sign    Worried About Running Out of Food in the Last Year: Never true    Ran Out of Food in the Last Year: Never true  Transportation Needs: No Transportation Needs (04/19/2023)   PRAPARE - Administrator, Civil Service (Medical): No    Lack of Transportation (Non-Medical): No  Physical Activity: Sufficiently Active (04/19/2023)   Exercise Vital Sign    Days of Exercise per Week: 4 days    Minutes of Exercise per Session: 60 min  Stress: No Stress Concern Present (04/19/2023)   [employer] of Occupational Health - Occupational Stress Questionnaire    Feeling of Stress : Not at all  Social Connections: Moderately Integrated (04/19/2023)   Social Connection and Isolation Panel [NHANES]    Frequency of Communication with Friends and Family: More than three times a week    Frequency of Social Gatherings with Friends and Family: More than three times a week    Attends Religious Services: More than 4 times per year    Active Member of [employer] or Organizations: No    Attends Banker Meetings: Not on file    Marital Status: Married  Catering manager Violence: Not At Risk (04/19/2023)   Humiliation, Afraid, Rape, and Kick questionnaire    Fear of Current or Ex-Partner: No    Emotionally Abused: No    Physically Abused: No    Sexually Abused: No                                                                                                  Objective:  Physical Exam: BP 130/78   Pulse 74   Temp 97.9 F (36.6 C) (Temporal)   Wt 212 lb 9.6 oz (96.4 kg)  SpO2 97%   BMI 31.40 kg/m    Physical Exam GENERAL: Alert, cooperative, well developed, no acute distress. HEENT: Normocephalic, normal oropharynx, moist mucous membranes. CHEST: Clear to auscultation bilaterally, no wheezes, rhonchi, or crackles. CARDIOVASCULAR: Normal  heart rate and rhythm, S1 and S2 normal without murmurs. ABDOMEN: Soft, non-tender, non-distended, without organomegaly, normal bowel sounds. EXTREMITIES: Right leg swelling, pitting +1, up to mid shin, possible bruise on anterior ankle, not warm, < 3 s cap refill, tender on palpation, no erythema, no cyanosis. NEUROLOGICAL: Cranial nerves grossly intact, moves all extremities without gross motor or sensory deficit.     No results found.  Recent Results (from the past 2160 hours)  Lipid panel     Status: Abnormal   Collection Time: 04/30/23  9:14 AM  Result Value Ref Range   Cholesterol 114 0 - 200 mg/dL    Comment: ATP III Classification       Desirable:  < 200 mg/dL               Borderline High:  200 - 239 mg/dL          High:  > = 161 mg/dL   Triglycerides 096.0 (H) 0.0 - 149.0 mg/dL    Comment: Normal:  <454 mg/dLBorderline High:  150 - 199 mg/dL   HDL 09.81 (L) >19.14 mg/dL   VLDL 78.2 0.0 - 95.6 mg/dL   LDL Cholesterol 45 0 - 99 mg/dL   Total CHOL/HDL Ratio 3     Comment:                Men          Women1/2 Average Risk     3.4          3.3Average Risk          5.0          4.42X Average Risk          9.6          7.13X Average Risk          15.0          11.0                       NonHDL 77.20     Comment: NOTE:  Non-HDL goal should be 30 mg/dL higher than patient's LDL goal (i.e. LDL goal of < 70 mg/dL, would have non-HDL goal of < 100 mg/dL)  Microalbumin / creatinine urine ratio     Status: None   Collection Time: 04/30/23  9:14 AM  Result Value Ref Range   Microalb, Ur <0.7 0.0 - 1.9 mg/dL   Creatinine,U 213.0 mg/dL   Microalb Creat Ratio 6.0 0.0 - 30.0 mg/g    Comment: Revised result called to Malena Peer CMA AT 4:26 by Luellen Pucker MT(ASCP)  Basic metabolic panel     Status: Abnormal   Collection Time: 04/30/23  9:14 AM  Result Value Ref Range   Sodium 141 135 - 145 mEq/L   Potassium 4.4 3.5 - 5.1 mEq/L   Chloride 101 96 - 112 mEq/L   CO2 28 19 - 32  mEq/L   Glucose, Bld 111 (H) 70 - 99 mg/dL   BUN 17 6 - 23 mg/dL   Creatinine, Ser 8.65 0.40 - 1.50 mg/dL   GFR 78.46 >96.29 mL/min    Comment: Calculated using the CKD-EPI Creatinine Equation (2021)   Calcium 9.7 8.4 - 10.5 mg/dL  Hemoglobin A1c     Status: Abnormal   Collection Time: 04/30/23  9:14 AM  Result Value Ref Range   Hgb A1c MFr Bld 7.3 (H) 4.6 - 6.5 %    Comment: Glycemic Control Guidelines for People with Diabetes:Non Diabetic:  <6%Goal of Therapy: <7%Additional Action Suggested:  >8%   Urinalysis, Routine w reflex microscopic     Status: None   Collection Time: 04/30/23  9:14 AM  Result Value Ref Range   Color, Urine YELLOW Yellow;Lt. Yellow;Straw;Dark Yellow;Amber;Green;Red;Brown   APPearance CLEAR Clear;Turbid;Slightly Cloudy;Cloudy   Specific Gravity, Urine 1.020 1.000 - 1.030   pH 7.0 5.0 - 8.0   Total Protein, Urine NEGATIVE Negative   Urine Glucose NEGATIVE Negative   Ketones, ur NEGATIVE Negative   Bilirubin Urine NEGATIVE Negative   Hgb urine dipstick NEGATIVE Negative   Urobilinogen, UA 1.0 0.0 - 1.0   Leukocytes,Ua NEGATIVE Negative   Nitrite NEGATIVE Negative   WBC, UA 0-2/hpf 0-2/hpf   RBC / HPF none seen 0-2/hpf   Squamous Epithelial / HPF Rare(0-4/hpf) Rare(0-4/hpf)        Garner Nash, MD, MS

## 2023-06-29 ENCOUNTER — Ambulatory Visit (HOSPITAL_COMMUNITY)
Admission: RE | Admit: 2023-06-29 | Discharge: 2023-06-29 | Disposition: A | Discharge status: Home / Self Care | Source: Ambulatory Visit | Attending: Family Medicine | Admitting: Family Medicine

## 2023-06-29 DIAGNOSIS — Z86711 Personal history of pulmonary embolism: Secondary | ICD-10-CM | POA: Insufficient documentation

## 2023-06-29 DIAGNOSIS — M7989 Other specified soft tissue disorders: Secondary | ICD-10-CM | POA: Diagnosis not present

## 2023-07-01 ENCOUNTER — Telehealth: Payer: Self-pay

## 2023-07-01 NOTE — Telephone Encounter (Signed)
 Left patient a detailed voice message to return call to office.

## 2023-07-01 NOTE — Telephone Encounter (Signed)
 Contacted patient and provided results. Patient verbalized understanding. Pt stated swelling has gone down.

## 2023-07-01 NOTE — Telephone Encounter (Signed)
-----   Message from Garnette Gunner sent at 06/29/2023  4:59 PM EDT ----- No DVT seen in leg. Please call patient to inform. He can follow back up in clinic to reevaluate his leg swelling. ----- Message ----- From: Interface, [employer] Sent: 06/29/2023   2:42 PM EDT To: Garnette Gunner, MD

## 2023-07-30 ENCOUNTER — Encounter (HOSPITAL_COMMUNITY): Payer: Self-pay

## 2023-08-06 ENCOUNTER — Ambulatory Visit: Payer: PPO | Admitting: Family Medicine

## 2023-08-06 ENCOUNTER — Encounter: Payer: Self-pay | Admitting: Family Medicine

## 2023-08-06 ENCOUNTER — Ambulatory Visit: Payer: Self-pay | Admitting: Family Medicine

## 2023-08-06 VITALS — BP 130/74 | HR 73 | Temp 97.1°F | Ht 69.0 in | Wt 210.2 lb

## 2023-08-06 DIAGNOSIS — E782 Mixed hyperlipidemia: Secondary | ICD-10-CM | POA: Diagnosis not present

## 2023-08-06 DIAGNOSIS — J309 Allergic rhinitis, unspecified: Secondary | ICD-10-CM | POA: Diagnosis not present

## 2023-08-06 DIAGNOSIS — I10 Essential (primary) hypertension: Secondary | ICD-10-CM | POA: Diagnosis not present

## 2023-08-06 DIAGNOSIS — I25118 Atherosclerotic heart disease of native coronary artery with other forms of angina pectoris: Secondary | ICD-10-CM | POA: Diagnosis not present

## 2023-08-06 DIAGNOSIS — Z7984 Long term (current) use of oral hypoglycemic drugs: Secondary | ICD-10-CM

## 2023-08-06 DIAGNOSIS — E1142 Type 2 diabetes mellitus with diabetic polyneuropathy: Secondary | ICD-10-CM | POA: Diagnosis not present

## 2023-08-06 LAB — HEMOGLOBIN A1C: Hgb A1c MFr Bld: 6.6 % — ABNORMAL HIGH (ref 4.6–6.5)

## 2023-08-06 LAB — GLUCOSE, RANDOM: Glucose, Bld: 100 mg/dL — ABNORMAL HIGH (ref 70–99)

## 2023-08-06 MED ORDER — FLUTICASONE PROPIONATE 50 MCG/ACT NA SUSP: 1.0000 | Freq: Two times a day (BID) | NASAL | 2 refills | Status: AC

## 2023-08-06 NOTE — Assessment & Plan Note (Signed)
 Lipids are at goal. Continue atorvastatin  40 mg daily, fenofibrate  134 mg daily, and fish oil

## 2023-08-06 NOTE — Assessment & Plan Note (Signed)
 BP is in acceptable control. Continue chlorthalidone  25 mg daily and metoprolol  succinate 25 mg daily.

## 2023-08-06 NOTE — Assessment & Plan Note (Signed)
 Stable. I will renew his fluticasone  spray.

## 2023-08-06 NOTE — Assessment & Plan Note (Signed)
 We will check an A1c today. I will continue metformin  1,000 mg bid and sitagliptin  50 mg daily. We did discuss that his loose stools may be due to his metformin . I would consider stopping metformin  and switching to another medicine (such as empagliflozin). He feels the diarrhea is tolerable for now.

## 2023-08-06 NOTE — Assessment & Plan Note (Signed)
 No current chest pain. Continue aspirin  81 mg daily, Plavix  75 mg daily, metoprolol  succinate 25 mg daily, and NTG PRN.

## 2023-08-06 NOTE — Progress Notes (Signed)
 [facility] [address] [place] [address] Dept: [phone] Dept Fax: [phone]  Chronic Care Office Visit  Subjective:    Patient ID: Chris [name], male    DOB: Dec 04, 1955, 68 y.o..   MRN: [id number]  Chief Complaint  Patient presents with   Hypertension   History of Present Illness:  Patient is in today for reassessment of chronic medical issues.  Chris [name] has a history of Type 2 diabetes complicated with neuropathy and retinopathy. He is managed on metformin  1000 mg twice daily and sitagliptin  (Januvia ) 50 mg daily (added after his last visit due to increasing A1c). His neuropathy is managed with gabapentin  300 mg 1-2 at bedtime. He continues to note an issue with loose stools/diarrhea much of the time. He notes if he misses a dose of metformin , he does not have the diarrhea issue.   Chris [name] has a past history of a myocardial infarction and is s/p placement of 2 cardiac stents. He is on an aspirin  81 mg daily, Plavix  75 mg daily, metoprolol  succinate 25 mg daily, and has NTG available. He also has hyperlipidemia and is treated with atorvastatin  40 mg daily, fenofibrate  134 mg daily, and fish oil. He quit tobacco use about 15 years ago.   Chris [name] has a history of hypertension. He is managed on chlorthalidone  25 mg daily and metoprolol  succinate 25 mg daily.  Chris [name] was seen about a month ago related to acute onset of right lower leg edema. He had a venous ultrasound which did not show any evidence of DVT.  This has since resolved.  Past Medical History: Patient Active Problem List   Diagnosis Date Noted   Right leg swelling 06/28/2023   Essential hypertension 08/11/2022   Chronic pain of left ankle- s/p ORIF of ankle fracture with hardware 04/15/2022   Lower urinary tract symptoms (LUTS) 11/06/2021   Diabetic peripheral neuropathy (HCC) 06/28/2020   Diabetic retinopathy (HCC) 06/28/2020    Allergic rhinitis 06/28/2020   History of pulmonary embolus (PE) 06/28/2020   Type 2 diabetes mellitus with diabetic polyneuropathy, without long-term current use of insulin (HCC) 10/05/2018   Plantar fasciitis 10/05/2018   GERD (gastroesophageal reflux disease) 12/05/2016   Ventral hernia without obstruction or gangrene 11/30/2016   History of MI (myocardial infarction) 05/21/2014   Erectile dysfunction 07/25/2012   Restless leg syndrome 02/20/2010   Hyperlipidemia 02/19/2010   Obstructive sleep apnea 02/19/2010   Coronary artery disease 02/19/2010   Past Surgical History:  Procedure Laterality Date   APPENDECTOMY     CHOLECYSTECTOMY OPEN  ~ 2008   "it exploded"   CORONARY ANGIOPLASTY WITH STENT PLACEMENT  2009    2.75 x 28 mm Promus stent to the RCA   FRACTURE SURGERY     HERNIA REPAIR     INSERTION OF MESH N/A 11/30/2016   Procedure: INSERTION OF MESH;  Surgeon: Jerryl Morin, MD;  Location: [facility] OR;  Service: General;  Laterality: N/A;   LAPAROSCOPIC APPENDECTOMY N/A 09/16/2016   Procedure: APPENDECTOMY LAPAROSCOPIC;  Surgeon: Jerryl Morin, MD;  Location: [facility] OR;  Service: General;  Laterality: N/A;   LAPAROSCOPIC INCISIONAL / UMBILICAL / VENTRAL HERNIA REPAIR  11/30/2016   w/repair serosal tear   ORIF ANKLE FRACTURE Left 11/27/2015   Procedure: OPEN REDUCTION INTERNAL FIXATION (ORIF) ANKLE FRACTURE;  Surgeon: Wendolyn Hamburger, MD;  Location: [facility];  Service: Orthopedics;  Laterality: Left;   VENTRAL HERNIA REPAIR N/A 11/30/2016   Procedure: LAPAROSCOPIC  VENTRAL HERNIA REPAIR AND  REPAIR OF SEROSAL TEAR;  Surgeon: Jerryl Morin, MD;  Location: [facility] OR;  Service: General;  Laterality: N/A;   Family History  Problem Relation Age of Onset   Heart disease Mother    Hypertension Father    Prostate cancer Brother    CAD Brother    Outpatient Medications Prior to Visit  Medication Sig Dispense Refill   allopurinol (ZYLOPRIM) 300 MG tablet Take 300 mg by mouth daily.      Apple Cider Vinegar-Ginger 500-5 MG CHEW Chew by mouth.     aspirin  81 MG tablet Take 81 mg by mouth daily.     atorvastatin  (LIPITOR) 40 MG tablet TAKE 1 TABLET BY MOUTH EVERY DAY 90 tablet 3   chlorthalidone  (HYGROTON ) 25 MG tablet TAKE 1 TABLET (25 MG TOTAL) BY MOUTH DAILY. 90 tablet 0   clopidogrel  (PLAVIX ) 75 MG tablet TAKE 1 TABLET BY MOUTH EVERY DAY 90 tablet 3   fenofibrate  micronized (LOFIBRA) 134 MG capsule TAKE 1 CAPSULE BY MOUTH DAILY. 90 capsule 3   gabapentin  (NEURONTIN ) 300 MG capsule TAKE 1-2 CAPSULES (300-600 MG TOTAL) BY MOUTH AT BEDTIME. 180 capsule 1   levocetirizine (XYZAL ) 5 MG tablet Take 5 mg by mouth at bedtime as needed for allergies.     loperamide (IMODIUM) 2 MG capsule Take by mouth as needed for diarrhea or loose stools.     metFORMIN  (GLUCOPHAGE ) 1000 MG tablet Take 1 tablet (1,000 mg total) by mouth 2 (two) times daily with a meal. 180 tablet 3   metoprolol  succinate (TOPROL -XL) 25 MG 24 hr tablet TAKE 1/2 TABLET TWICE A DAY BY MOUTH 90 tablet 3   multivitamin-iron -minerals-folic acid (CENTRUM) chewable tablet Chew 1 tablet by mouth daily.     Omega-3 Fatty Acids (FISH OIL) 1200 MG CAPS Take 2 capsules by mouth 2 (two) times daily.     pantoprazole  (PROTONIX ) 40 MG tablet TAKE 1 TABLET BY MOUTH EVERY DAY 90 tablet 3   sitaGLIPtin  (JANUVIA ) 50 MG tablet Take 1 tablet (50 mg total) by mouth daily. 90 tablet 3   tamsulosin  (FLOMAX ) 0.4 MG CAPS capsule TAKE 1 CAPSULE BY MOUTH EVERY DAY 90 capsule 3   zinc gluconate 50 MG tablet Take 50 mg by mouth daily.     fluticasone  (FLONASE ) 50 MCG/ACT nasal spray Place 1 spray into both nostrils 2 (two) times daily. 48 mL 2   nitroGLYCERIN  (NITROSTAT ) 0.4 MG SL tablet Place 1 tablet (0.4 mg total) under the tongue every 5 (five) minutes as needed for chest pain. 100 tablet 1   No facility-administered medications prior to visit.   Allergies  Allergen Reactions   Ace Inhibitors Rash   Ropinirole Hcl Rash   Objective:    Today's Vitals   08/06/23 0910  BP: 130/74  Pulse: 73  Temp: (!) 97.1 F (36.2 C)  TempSrc: Temporal  SpO2: 95%  Weight: 210 lb 3.2 oz (95.3 kg)  Height: 5\' 9"  (1.753 m)   Body mass index is 31.04 kg/m.   General: Well developed, well nourished. No acute distress. Lungs: Clear to auscultation bilaterally. No wheezing, rales or rhonchi. CV: RRR without murmurs or rubs. Pulses 2+ bilaterally. Extremities: No edema noted. Psych: Alert and oriented. Normal mood and affect.  Health Maintenance Due  Topic Date Due   Zoster Vaccines- Shingrix (1 of 2) Never done   OPHTHALMOLOGY EXAM  07/21/2023     Assessment & Plan:   Problem List Items Addressed This Visit  Cardiovascular and Mediastinum   Coronary artery disease   No current chest pain. Continue aspirin  81 mg daily, Plavix  75 mg daily, metoprolol  succinate 25 mg daily, and NTG PRN.      Essential hypertension - Primary   BP is in acceptable control. Continue chlorthalidone  25 mg daily and metoprolol  succinate 25 mg daily.        Respiratory   Allergic rhinitis   Stable. I will renew his fluticasone  spray.      Relevant Medications   fluticasone  (FLONASE ) 50 MCG/ACT nasal spray     Endocrine   Type 2 diabetes mellitus with diabetic polyneuropathy, without long-term current use of insulin (HCC)   We will check an A1c today. I will continue metformin  1,000 mg bid and sitagliptin  50 mg daily. We did discuss that his loose stools may be due to his metformin . I would consider stopping metformin  and switching to another medicine (such as empagliflozin). He feels the diarrhea is tolerable for now.      Relevant Orders   Glucose, random   Hemoglobin A1c     Other   Hyperlipidemia   Lipids are at goal. Continue atorvastatin  40 mg daily, fenofibrate  134 mg daily, and fish oil       Return in about 3 months (around 11/06/2023) for Reassessment.   Graig Lawyer, MD

## 2023-08-12 ENCOUNTER — Encounter: Payer: Self-pay | Admitting: Internal Medicine

## 2023-08-12 ENCOUNTER — Ambulatory Visit (INDEPENDENT_AMBULATORY_CARE_PROVIDER_SITE_OTHER): Admitting: Internal Medicine

## 2023-08-12 VITALS — BP 116/70 | HR 64 | Temp 98.0°F | Ht 69.0 in | Wt 207.0 lb

## 2023-08-12 DIAGNOSIS — J019 Acute sinusitis, unspecified: Secondary | ICD-10-CM

## 2023-08-12 NOTE — Progress Notes (Signed)
 [facility] [address] [place] [address] Dept: [phone] Dept Fax: [phone]  Acute Care Office Visit  Subjective:   Chris [name] November 26, 1955 08/12/2023  Chief Complaint  Patient presents with   Sinus Problem    Started yesterday     HPI: Discussed the use of AI scribe software for clinical note transcription with the patient, who gave verbal consent to proceed.  History of Present Illness   Greysin E Stamey is a 68 year old male who presents with sinus pressure and congestion.  He experiences a sensation of pressure in his head and nasal congestion that began earlier in the day. Flonase  nasal spray alleviated the nasal congestion within about 20 minutes. He did not use the nasal spray the previous day until later in the afternoon, which he believes might have contributed to his symptoms today.  No fever, chills, shivering, cough, chest pain, shortness of breath, or vision changes. He notes occasional light earaches.     The following portions of the patient's history were reviewed and updated as appropriate: past medical history, past surgical history, family history, social history, allergies, medications, and problem list.   Patient Active Problem List   Diagnosis Date Noted   Right leg swelling 06/28/2023   Essential hypertension 08/11/2022   Chronic pain of left ankle- s/p ORIF of ankle fracture with hardware 04/15/2022   Lower urinary tract symptoms (LUTS) 11/06/2021   Diabetic peripheral neuropathy (HCC) 06/28/2020   Diabetic retinopathy (HCC) 06/28/2020   Allergic rhinitis 06/28/2020   History of pulmonary embolus (PE) 06/28/2020   Type 2 diabetes mellitus with diabetic polyneuropathy, without long-term current use of insulin (HCC) 10/05/2018   Plantar fasciitis 10/05/2018   GERD (gastroesophageal reflux disease) 12/05/2016   Ventral hernia without obstruction or gangrene 11/30/2016   History of  MI (myocardial infarction) 05/21/2014   Erectile dysfunction 07/25/2012   Restless leg syndrome 02/20/2010   Hyperlipidemia 02/19/2010   Obstructive sleep apnea 02/19/2010   Coronary artery disease 02/19/2010   Past Medical History:  Diagnosis Date   Acute MI, inferior wall, initial episode of care [facility]) 2009   Allergy    Anemia    Arthritis    Coronary atherosclerosis of unspecified type of vessel, native or graft    GERD (gastroesophageal reflux disease)    High cholesterol    Hypertension    Seasonal allergies    Sleep apnea    "I've never worn a mask" (09/17/2016)   Past Surgical History:  Procedure Laterality Date   APPENDECTOMY     CHOLECYSTECTOMY OPEN  ~ 2008   "it exploded"   CORONARY ANGIOPLASTY WITH STENT PLACEMENT  2009    2.75 x 28 mm Promus stent to the RCA   FRACTURE SURGERY     HERNIA REPAIR     INSERTION OF MESH N/A 11/30/2016   Procedure: INSERTION OF MESH;  Surgeon: Jerryl Morin, MD;  [facility];  Service: General;  Laterality: N/A;   LAPAROSCOPIC APPENDECTOMY N/A 09/16/2016   Procedure: APPENDECTOMY LAPAROSCOPIC;  Surgeon: Jerryl Morin, MD;  [facility];  Service: General;  Laterality: N/A;   LAPAROSCOPIC INCISIONAL / UMBILICAL / VENTRAL HERNIA REPAIR  11/30/2016   w/repair serosal tear   ORIF ANKLE FRACTURE Left 11/27/2015   Procedure: OPEN REDUCTION INTERNAL FIXATION (ORIF) ANKLE FRACTURE;  Surgeon: Wendolyn Hamburger, MD;  Location: [facility];  Service: Orthopedics;  Laterality: Left;   VENTRAL HERNIA REPAIR N/A 11/30/2016   Procedure:  LAPAROSCOPIC VENTRAL HERNIA REPAIR AND  REPAIR OF SEROSAL TEAR;  Surgeon: Jerryl Morin, MD;  Location: [facility] OR;  Service: General;  Laterality: N/A;   Family History  Problem Relation Age of Onset   Heart disease Mother    Hypertension Father    Prostate cancer Brother    CAD Brother     Current Outpatient Medications:    allopurinol (ZYLOPRIM) 300 MG tablet, Take 300 mg by mouth daily., Disp: , Rfl:     Apple Cider Vinegar-Ginger 500-5 MG CHEW, Chew by mouth., Disp: , Rfl:    aspirin  81 MG tablet, Take 81 mg by mouth daily., Disp: , Rfl:    atorvastatin  (LIPITOR) 40 MG tablet, TAKE 1 TABLET BY MOUTH EVERY DAY, Disp: 90 tablet, Rfl: 3   chlorthalidone  (HYGROTON ) 25 MG tablet, TAKE 1 TABLET (25 MG TOTAL) BY MOUTH DAILY., Disp: 90 tablet, Rfl: 0   clopidogrel  (PLAVIX ) 75 MG tablet, TAKE 1 TABLET BY MOUTH EVERY DAY, Disp: 90 tablet, Rfl: 3   fenofibrate  micronized (LOFIBRA) 134 MG capsule, TAKE 1 CAPSULE BY MOUTH DAILY., Disp: 90 capsule, Rfl: 3   fluticasone  (FLONASE ) 50 MCG/ACT nasal spray, Place 1 spray into both nostrils 2 (two) times daily., Disp: 48 mL, Rfl: 2   gabapentin  (NEURONTIN ) 300 MG capsule, TAKE 1-2 CAPSULES (300-600 MG TOTAL) BY MOUTH AT BEDTIME., Disp: 180 capsule, Rfl: 1   levocetirizine (XYZAL ) 5 MG tablet, Take 5 mg by mouth at bedtime as needed for allergies., Disp: , Rfl:    loperamide (IMODIUM) 2 MG capsule, Take by mouth as needed for diarrhea or loose stools., Disp: , Rfl:    metFORMIN  (GLUCOPHAGE ) 1000 MG tablet, Take 1 tablet (1,000 mg total) by mouth 2 (two) times daily with a meal., Disp: 180 tablet, Rfl: 3   metoprolol  succinate (TOPROL -XL) 25 MG 24 hr tablet, TAKE 1/2 TABLET TWICE A DAY BY MOUTH, Disp: 90 tablet, Rfl: 3   multivitamin-iron -minerals-folic acid (CENTRUM) chewable tablet, Chew 1 tablet by mouth daily., Disp: , Rfl:    Omega-3 Fatty Acids (FISH OIL) 1200 MG CAPS, Take 2 capsules by mouth 2 (two) times daily., Disp: , Rfl:    pantoprazole  (PROTONIX ) 40 MG tablet, TAKE 1 TABLET BY MOUTH EVERY DAY, Disp: 90 tablet, Rfl: 3   sitaGLIPtin  (JANUVIA ) 50 MG tablet, Take 1 tablet (50 mg total) by mouth daily., Disp: 90 tablet, Rfl: 3   tamsulosin  (FLOMAX ) 0.4 MG CAPS capsule, TAKE 1 CAPSULE BY MOUTH EVERY DAY, Disp: 90 capsule, Rfl: 3   zinc gluconate 50 MG tablet, Take 50 mg by mouth daily., Disp: , Rfl:    nitroGLYCERIN  (NITROSTAT ) 0.4 MG SL tablet, Place 1 tablet  (0.4 mg total) under the tongue every 5 (five) minutes as needed for chest pain., Disp: 100 tablet, Rfl: 1 Allergies  Allergen Reactions   Ace Inhibitors Rash   Ropinirole Hcl Rash     ROS: A complete ROS was performed with pertinent positives/negatives noted in the HPI. The remainder of the ROS are negative.    Objective:   Today's Vitals   08/12/23 1422  BP: 116/70  Pulse: 64  Temp: 98 F (36.7 C)  TempSrc: Temporal  SpO2: 97%  Weight: 207 lb (93.9 kg)  Height: 5\' 9"  (1.753 m)    GENERAL: Well-appearing, in NAD. Well nourished.  SKIN: Pink, warm and dry. No rash, lesion, ulceration, or ecchymoses.  HEENT:    HEAD: Normocephalic, non-traumatic.  EYES: Conjunctive pink without exudate.  EARS: External ear w/o redness, swelling,  masses, or lesions. EAC clear. TM's intact, translucent w/o bulging, appropriate landmarks visualized.  NOSE: Septum midline w/o deformity. Nares patent, mucosa pink and non-inflamed with clear drainage. No sinus tenderness.  THROAT: Uvula midline. Oropharynx clear. Tonsils non-inflamed w/o exudate. Mucus membranes pink and moist.  NECK: Trachea midline. Full ROM w/o pain or tenderness. No lymphadenopathy.  RESPIRATORY: Chest wall symmetrical. Respirations even and non-labored. Breath sounds clear to auscultation bilaterally.  CARDIAC: S1, S2 present, regular rate and rhythm. Peripheral pulses 2+ bilaterally.  EXTREMITIES: Without clubbing, cyanosis, or edema.  NEUROLOGIC: Steady, even gait.  PSYCH/MENTAL STATUS: Alert, oriented x 3. Cooperative, appropriate mood and affect.    No results found for any visits on 08/12/23.    Assessment & Plan:  Assessment and Plan    Acute sinusitis Intermittent nasal congestion and head pressure likely due to sinusitis. Symptoms improved with Flonase . Mild earache present. Blood pressure well-controlled. - Continue Flonase  once or twice daily. - Recommend Coricidin HBP as a decongestant.       No orders of  the defined types were placed in this encounter.  No orders of the defined types were placed in this encounter.  Lab Orders  No laboratory test(s) ordered today   No images are attached to the encounter or orders placed in the encounter.  Return if symptoms worsen or fail to improve.   Gavin Kast, FNP

## 2023-08-12 NOTE — Patient Instructions (Signed)
 Rest, drink plenty of fluids.   For nasal and sinus congestion: Use over-the-counter Flonase : 2 nasal sprays on each side of the nose in the morning until you feel better   Patients with high blood pressure can use Coricidin HBP for decongestant, it will not raise your blood pressure.    Call if not gradually better over the next  10 days  Call anytime if the symptoms are severe, you have high fever, short of breath, chest pain

## 2023-09-02 ENCOUNTER — Other Ambulatory Visit: Payer: Self-pay

## 2023-09-02 DIAGNOSIS — E1142 Type 2 diabetes mellitus with diabetic polyneuropathy: Secondary | ICD-10-CM

## 2023-09-02 MED ORDER — GABAPENTIN 300 MG PO CAPS: 300.0000 mg | ORAL_CAPSULE | Freq: Every day | ORAL | 1 refills | Status: DC

## 2023-09-02 NOTE — Telephone Encounter (Signed)
 Refill request for Gabapenten 300 mg LR 02/19/23, #180, 1 rf  LOV  08/06/23 FOV  none scheduled.    Okay to fill per provider. Dm/cma

## 2023-09-09 DIAGNOSIS — L538 Other specified erythematous conditions: Secondary | ICD-10-CM | POA: Diagnosis not present

## 2023-09-09 DIAGNOSIS — L2989 Other pruritus: Secondary | ICD-10-CM | POA: Diagnosis not present

## 2023-09-09 DIAGNOSIS — R208 Other disturbances of skin sensation: Secondary | ICD-10-CM | POA: Diagnosis not present

## 2023-09-09 DIAGNOSIS — R58 Hemorrhage, not elsewhere classified: Secondary | ICD-10-CM | POA: Diagnosis not present

## 2023-09-09 DIAGNOSIS — L720 Epidermal cyst: Secondary | ICD-10-CM | POA: Diagnosis not present

## 2023-09-09 DIAGNOSIS — L918 Other hypertrophic disorders of the skin: Secondary | ICD-10-CM | POA: Diagnosis not present

## 2023-09-26 ENCOUNTER — Ambulatory Visit
Admission: EM | Admit: 2023-09-26 | Discharge: 2023-09-26 | Disposition: A | Discharge status: Home / Self Care | Attending: Family Medicine | Admitting: Family Medicine

## 2023-09-26 DIAGNOSIS — S61212A Laceration without foreign body of right middle finger without damage to nail, initial encounter: Secondary | ICD-10-CM | POA: Diagnosis not present

## 2023-09-26 DIAGNOSIS — M79644 Pain in right finger(s): Secondary | ICD-10-CM | POA: Diagnosis not present

## 2023-09-26 MED ORDER — BACITRACIN ZINC 500 UNIT/GM EX OINT: 1.0000 | TOPICAL_OINTMENT | Freq: Two times a day (BID) | CUTANEOUS | 0 refills | Status: DC

## 2023-09-26 NOTE — ED Triage Notes (Addendum)
 Pt cut right middle finger on a mandolin slicer 7/2-states continues to bleed-NAD-steady gait

## 2023-09-26 NOTE — ED Provider Notes (Signed)
 [facility]  Note:  This document was prepared using Conservation officer, historic buildings and may include unintentional dictation errors.  MRN: [id number] DOB: 01/13/1956  Subjective:   Chris [name] is a 68 y.o. male presenting for 4-day history of persistent right middle finger pain.  Generally symptoms are mild and with contact only.  This is following an avulsion laceration of the finger pad.  Has kept the wound clean, covered, uses peroxide.  No fever, redness, drainage of pus.  No current facility-administered medications for this encounter.  Current Outpatient Medications:    allopurinol (ZYLOPRIM) 300 MG tablet, Take 300 mg by mouth daily., Disp: , Rfl:    Apple Cider Vinegar-Ginger 500-5 MG CHEW, Chew by mouth., Disp: , Rfl:    aspirin  81 MG tablet, Take 81 mg by mouth daily., Disp: , Rfl:    atorvastatin  (LIPITOR) 40 MG tablet, TAKE 1 TABLET BY MOUTH EVERY DAY, Disp: 90 tablet, Rfl: 3   chlorthalidone  (HYGROTON ) 25 MG tablet, TAKE 1 TABLET (25 MG TOTAL) BY MOUTH DAILY., Disp: 90 tablet, Rfl: 0   clopidogrel  (PLAVIX ) 75 MG tablet, TAKE 1 TABLET BY MOUTH EVERY DAY, Disp: 90 tablet, Rfl: 3   fenofibrate  micronized (LOFIBRA) 134 MG capsule, TAKE 1 CAPSULE BY MOUTH DAILY., Disp: 90 capsule, Rfl: 3   fluticasone  (FLONASE ) 50 MCG/ACT nasal spray, Place 1 spray into both nostrils 2 (two) times daily., Disp: 48 mL, Rfl: 2   gabapentin  (NEURONTIN ) 300 MG capsule, Take 1-2 capsules (300-600 mg total) by mouth at bedtime., Disp: 180 capsule, Rfl: 1   levocetirizine (XYZAL ) 5 MG tablet, Take 5 mg by mouth at bedtime as needed for allergies., Disp: , Rfl:    loperamide (IMODIUM) 2 MG capsule, Take by mouth as needed for diarrhea or loose stools., Disp: , Rfl:    metFORMIN  (GLUCOPHAGE ) 1000 MG tablet, Take 1 tablet (1,000 mg total) by mouth 2 (two) times daily with a meal., Disp: 180 tablet, Rfl: 3   metoprolol  succinate (TOPROL -XL) 25 MG 24 hr tablet, TAKE 1/2 TABLET  TWICE A DAY BY MOUTH, Disp: 90 tablet, Rfl: 3   multivitamin-iron -minerals-folic acid (CENTRUM) chewable tablet, Chew 1 tablet by mouth daily., Disp: , Rfl:    nitroGLYCERIN  (NITROSTAT ) 0.4 MG SL tablet, Place 1 tablet (0.4 mg total) under the tongue every 5 (five) minutes as needed for chest pain., Disp: 100 tablet, Rfl: 1   Omega-3 Fatty Acids (FISH OIL) 1200 MG CAPS, Take 2 capsules by mouth 2 (two) times daily., Disp: , Rfl:    pantoprazole  (PROTONIX ) 40 MG tablet, TAKE 1 TABLET BY MOUTH EVERY DAY, Disp: 90 tablet, Rfl: 3   sitaGLIPtin  (JANUVIA ) 50 MG tablet, Take 1 tablet (50 mg total) by mouth daily., Disp: 90 tablet, Rfl: 3   tamsulosin  (FLOMAX ) 0.4 MG CAPS capsule, TAKE 1 CAPSULE BY MOUTH EVERY DAY, Disp: 90 capsule, Rfl: 3   zinc  gluconate 50 MG tablet, Take 50 mg by mouth daily., Disp: , Rfl:    Allergies  Allergen Reactions   Ace Inhibitors Rash   Ropinirole Hcl Rash    Past Medical History:  Diagnosis Date   Acute MI, inferior wall, initial episode of care [facility]) 2009   Allergy    Anemia    Arthritis    Coronary atherosclerosis of unspecified type of vessel, native or graft    GERD (gastroesophageal reflux disease)    High cholesterol    Hypertension    Seasonal allergies    Sleep apnea  I've never worn a mask (09/17/2016)     Past Surgical History:  Procedure Laterality Date   APPENDECTOMY     CHOLECYSTECTOMY OPEN  ~ 2008   it exploded   CORONARY ANGIOPLASTY WITH STENT PLACEMENT  2009    2.75 x 28 mm Promus stent to the RCA   FRACTURE SURGERY     HERNIA REPAIR     INSERTION OF MESH N/A 11/30/2016   Procedure: INSERTION OF MESH;  Surgeon: Kimble Agent, MD;  [facility];  Service: General;  Laterality: N/A;   LAPAROSCOPIC APPENDECTOMY N/A 09/16/2016   Procedure: APPENDECTOMY LAPAROSCOPIC;  Surgeon: Kimble Agent, MD;  [facility];  Service: General;  Laterality: N/A;   LAPAROSCOPIC INCISIONAL / UMBILICAL / VENTRAL HERNIA REPAIR  11/30/2016   w/repair  serosal tear   ORIF ANKLE FRACTURE Left 11/27/2015   Procedure: OPEN REDUCTION INTERNAL FIXATION (ORIF) ANKLE FRACTURE;  Surgeon: Dempsey Sensor, MD;  Location: [facility];  Service: Orthopedics;  Laterality: Left;   VENTRAL HERNIA REPAIR N/A 11/30/2016   Procedure: LAPAROSCOPIC VENTRAL HERNIA REPAIR AND  REPAIR OF SEROSAL TEAR;  Surgeon: Kimble Agent, MD;  [facility];  Service: General;  Laterality: N/A;    Family History  Problem Relation Age of Onset   Heart disease Mother    Hypertension Father    Prostate cancer Brother    CAD Brother     Social History   Tobacco Use   Smoking status: Former    Current packs/day: 0.00    Average packs/day: 2.5 packs/day for 36.0 years (90.0 ttl pk-yrs)    Types: Cigarettes    Start date: 08/22/1971    Quit date: 08/22/2007    Years since quitting: 16.1   Smokeless tobacco: Never  Vaping Use   Vaping status: Never Used  Substance Use Topics   Alcohol use: Not Currently    Comment: occ   Drug use: Not Currently    ROS   Objective:   Vitals: BP (!) 160/89 (BP Location: Left Arm)   Pulse 69   Temp 98.1 F (36.7 C) (Oral)   Resp 20   SpO2 97%   Physical Exam Constitutional:      General: He is not in acute distress.    Appearance: Normal appearance. He is well-developed and normal weight. He is not ill-appearing, toxic-appearing or diaphoretic.  HENT:     Head: Normocephalic and atraumatic.     Right Ear: External ear normal.     Left Ear: External ear normal.     Nose: Nose normal.     Mouth/Throat:     Pharynx: Oropharynx is clear.  Eyes:     General: No scleral icterus.       Right eye: No discharge.        Left eye: No discharge.     Extraocular Movements: Extraocular movements intact.  Cardiovascular:     Rate and Rhythm: Normal rate.  Pulmonary:     Effort: Pulmonary effort is normal.  Musculoskeletal:       Hands:     Cervical back: Normal range of motion.  Neurological:     Mental Status: He is  alert and oriented to person, place, and time.  Psychiatric:        Mood and Affect: Mood normal.        Behavior: Behavior normal.        Thought Content: Thought content normal.        Judgment: Judgment normal.  Wound cleansed and dressed.  Assessment and Plan :   PDMP not reviewed this encounter.  1. Finger pain, right   2. Laceration of right middle finger without foreign body without damage to nail, initial encounter    Wound must heal by secondary intention.  Discussed wound care.  No signs of secondary infection.  Counseled patient on potential for adverse effects with medications prescribed/recommended today, ER and return-to-clinic precautions discussed, patient verbalized understanding.    Christopher Savannah, PA-C 09/26/23 1245

## 2023-09-26 NOTE — Discharge Instructions (Signed)
Change your dressing 3-5 times daily. Every time you change your dressing, clean the wound gently with warm water and Dial antibacterial soap. Pat the wound dry, let it breathe for roughly an hour before covering it back up. When you reapply a dressing, apply Bacitracin ointment to the wound, then cover with non-stick/non-adherent gauze.   

## 2023-11-13 ENCOUNTER — Other Ambulatory Visit: Payer: Self-pay | Admitting: Family Medicine

## 2023-11-13 DIAGNOSIS — I10 Essential (primary) hypertension: Secondary | ICD-10-CM

## 2023-11-15 NOTE — Telephone Encounter (Signed)
 Patient scheduled for 11/23/23. Dm/cma

## 2023-11-19 ENCOUNTER — Other Ambulatory Visit: Payer: Self-pay | Admitting: Family Medicine

## 2023-11-19 ENCOUNTER — Other Ambulatory Visit: Payer: Self-pay | Admitting: Family

## 2023-11-19 DIAGNOSIS — R350 Frequency of micturition: Secondary | ICD-10-CM

## 2023-11-23 ENCOUNTER — Ambulatory Visit: Payer: Self-pay | Admitting: Family Medicine

## 2023-11-23 ENCOUNTER — Encounter: Payer: Self-pay | Admitting: Family Medicine

## 2023-11-23 ENCOUNTER — Ambulatory Visit (INDEPENDENT_AMBULATORY_CARE_PROVIDER_SITE_OTHER): Admitting: Family Medicine

## 2023-11-23 VITALS — BP 134/72 | HR 62 | Temp 97.6°F | Ht 69.0 in | Wt 207.4 lb

## 2023-11-23 DIAGNOSIS — Z7984 Long term (current) use of oral hypoglycemic drugs: Secondary | ICD-10-CM

## 2023-11-23 DIAGNOSIS — G8929 Other chronic pain: Secondary | ICD-10-CM

## 2023-11-23 DIAGNOSIS — I10 Essential (primary) hypertension: Secondary | ICD-10-CM | POA: Diagnosis not present

## 2023-11-23 DIAGNOSIS — I25118 Atherosclerotic heart disease of native coronary artery with other forms of angina pectoris: Secondary | ICD-10-CM | POA: Diagnosis not present

## 2023-11-23 DIAGNOSIS — E1142 Type 2 diabetes mellitus with diabetic polyneuropathy: Secondary | ICD-10-CM | POA: Diagnosis not present

## 2023-11-23 DIAGNOSIS — N521 Erectile dysfunction due to diseases classified elsewhere: Secondary | ICD-10-CM | POA: Diagnosis not present

## 2023-11-23 DIAGNOSIS — E782 Mixed hyperlipidemia: Secondary | ICD-10-CM

## 2023-11-23 DIAGNOSIS — M545 Low back pain, unspecified: Secondary | ICD-10-CM

## 2023-11-23 LAB — GLUCOSE, RANDOM: Glucose, Bld: 120 mg/dL — ABNORMAL HIGH (ref 70–99)

## 2023-11-23 LAB — HEMOGLOBIN A1C: Hgb A1c MFr Bld: 6.9 % — ABNORMAL HIGH (ref 4.6–6.5)

## 2023-11-23 MED ORDER — SILDENAFIL CITRATE 50 MG PO TABS: 50.0000 mg | ORAL_TABLET | Freq: Every day | ORAL | 11 refills | Status: AC | PRN

## 2023-11-23 MED ORDER — CHLORTHALIDONE 25 MG PO TABS: 25.0000 mg | ORAL_TABLET | Freq: Every day | ORAL | 3 refills | Status: AC

## 2023-11-23 MED ORDER — CYCLOBENZAPRINE HCL 10 MG PO TABS: 10.0000 mg | ORAL_TABLET | Freq: Three times a day (TID) | ORAL | 2 refills | Status: AC | PRN

## 2023-11-23 NOTE — Assessment & Plan Note (Signed)
 Lipids are at goal. Continue atorvastatin  40 mg daily, fenofibrate  134 mg daily, and fish oil

## 2023-11-23 NOTE — Assessment & Plan Note (Signed)
 Continue sildenafil as needed.

## 2023-11-23 NOTE — Assessment & Plan Note (Signed)
 No current chest pain. Continue aspirin  81 mg daily, clopidogrel  75 mg daily, metoprolol  succinate 25 mg daily, and NTG PRN.

## 2023-11-23 NOTE — Progress Notes (Signed)
 [facility] [address] [place] [address] Dept: [phone] Dept Fax: [phone]  Chronic Care Office Visit  Subjective:    Patient ID: Chris [name], male    DOB: 1956/02/27, 68 y.o..   MRN: [id number]  Chief Complaint  Patient presents with   Hypertension    F/u HTN.  No concerns.    History of Present Illness:  Patient is in today for reassessment of chronic medical issues.  Mr. Chris [name] has a history of Type 2 diabetes complicated with neuropathy and retinopathy. He is prescribed metformin  1000 mg twice daily and sitagliptin  (Januvia ) 50 mg daily. His neuropathy is managed with gabapentin  300 mg 1-2 at bedtime. He has had an issue with loose stools/diarrhea much of the time, so has cut his metformin  back to once a day. he feels this has helped to reduce his diarrhea.   Mr. Chris [name] has a past history of a myocardial infarction and is s/p placement of 2 cardiac stents. He is on an aspirin  81 mg daily, clopidogrel  75 mg daily, metoprolol  succinate 25 mg daily, and has NTG available. He also has hyperlipidemia and is treated with atorvastatin  40 mg daily, fenofibrate  134 mg daily, and fish oil. He quit tobacco use more than 15 years ago.   Mr. Chris [name] has a history of hypertension. He is managed on chlorthalidone  25 mg daily and metoprolol  succinate 25 mg daily.   Mr. Chris [name] has a history of ED. He notes his meds do ot always work. He thinks he had been receiving tadalfil. He asks about being prescribed sildenafil  instead.  Mr. Chris [name] has a history of chronic low back pain. he awakens with lower back stiffness most days. He feels he needs a new mattress, as this seems to contribute.  Past Medical History: Patient Active Problem List   Diagnosis Date Noted   Right leg swelling 06/28/2023   Essential hypertension 08/11/2022   Chronic pain of left ankle- s/p ORIF of ankle fracture with hardware 04/15/2022   Lower  urinary tract symptoms (LUTS) 11/06/2021   Diabetic peripheral neuropathy (HCC) 06/28/2020   Diabetic retinopathy (HCC) 06/28/2020   Allergic rhinitis 06/28/2020   History of pulmonary embolus (PE) 06/28/2020   Type 2 diabetes mellitus with diabetic polyneuropathy, without long-term current use of insulin (HCC) 10/05/2018   Plantar fasciitis 10/05/2018   GERD (gastroesophageal reflux disease) 12/05/2016   Ventral hernia without obstruction or gangrene 11/30/2016   History of MI (myocardial infarction) 05/21/2014   Erectile dysfunction 07/25/2012   Restless leg syndrome 02/20/2010   Hyperlipidemia 02/19/2010   Obstructive sleep apnea 02/19/2010   Coronary artery disease 02/19/2010   Past Surgical History:  Procedure Laterality Date   APPENDECTOMY     CHOLECYSTECTOMY OPEN  ~ 2008   it exploded   CORONARY ANGIOPLASTY WITH STENT PLACEMENT  2009    2.75 x 28 mm Promus stent to the RCA   FRACTURE SURGERY     HERNIA REPAIR     INSERTION OF MESH N/A 11/30/2016   Procedure: INSERTION OF MESH;  Surgeon: Kimble Agent, MD;  Location: [facility] OR;  Service: General;  Laterality: N/A;   LAPAROSCOPIC APPENDECTOMY N/A 09/16/2016   Procedure: APPENDECTOMY LAPAROSCOPIC;  Surgeon: Kimble Agent, MD;  Location: [facility] OR;  Service: General;  Laterality: N/A;   LAPAROSCOPIC INCISIONAL / UMBILICAL / VENTRAL HERNIA REPAIR  11/30/2016   w/repair serosal tear   ORIF ANKLE FRACTURE Left 11/27/2015   Procedure: OPEN REDUCTION INTERNAL FIXATION (ORIF) ANKLE FRACTURE;  Surgeon: Dempsey Sensor, MD;  Location: [facility];  Service: Orthopedics;  Laterality: Left;   VENTRAL HERNIA REPAIR N/A 11/30/2016   Procedure: LAPAROSCOPIC VENTRAL HERNIA REPAIR AND  REPAIR OF SEROSAL TEAR;  Surgeon: Kimble Agent, MD;  Location: [facility];  Service: General;  Laterality: N/A;   Family History  Problem Relation Age of Onset   Heart disease Mother    Hypertension Father    Prostate cancer Brother    CAD Brother    Outpatient  Medications Prior to Visit  Medication Sig Dispense Refill   allopurinol (ZYLOPRIM) 300 MG tablet Take 300 mg by mouth daily.     Apple Cider Vinegar-Ginger 500-5 MG CHEW Chew by mouth.     aspirin  81 MG tablet Take 81 mg by mouth daily.     atorvastatin  (LIPITOR) 40 MG tablet TAKE 1 TABLET BY MOUTH EVERY DAY 90 tablet 3   clopidogrel  (PLAVIX ) 75 MG tablet TAKE 1 TABLET BY MOUTH EVERY DAY 90 tablet 3   fenofibrate  micronized (LOFIBRA) 134 MG capsule TAKE 1 CAPSULE BY MOUTH EVERY DAY 90 capsule 3   fluticasone  (FLONASE ) 50 MCG/ACT nasal spray Place 1 spray into both nostrils 2 (two) times daily. 48 mL 2   gabapentin  (NEURONTIN ) 300 MG capsule Take 1-2 capsules (300-600 mg total) by mouth at bedtime. 180 capsule 1   levocetirizine (XYZAL ) 5 MG tablet Take 5 mg by mouth at bedtime as needed for allergies.     loperamide (IMODIUM) 2 MG capsule Take by mouth as needed for diarrhea or loose stools.     metFORMIN  (GLUCOPHAGE ) 1000 MG tablet Take 1 tablet (1,000 mg total) by mouth 2 (two) times daily with a meal. (Patient taking differently: Take 1,000 mg by mouth daily.) 180 tablet 3   metoprolol  succinate (TOPROL -XL) 25 MG 24 hr tablet TAKE 1/2 TABLET TWICE A DAY BY MOUTH 90 tablet 3   multivitamin-iron -minerals-folic acid (CENTRUM) chewable tablet Chew 1 tablet by mouth daily.     nitroGLYCERIN  (NITROSTAT ) 0.4 MG SL tablet Place 1 tablet (0.4 mg total) under the tongue every 5 (five) minutes as needed for chest pain. 100 tablet 1   Omega-3 Fatty Acids (FISH OIL) 1200 MG CAPS Take 2 capsules by mouth 2 (two) times daily.     pantoprazole  (PROTONIX ) 40 MG tablet TAKE 1 TABLET BY MOUTH EVERY DAY 90 tablet 3   sitaGLIPtin  (JANUVIA ) 50 MG tablet Take 1 tablet (50 mg total) by mouth daily. 90 tablet 3   tamsulosin  (FLOMAX ) 0.4 MG CAPS capsule TAKE 1 CAPSULE BY MOUTH EVERY DAY 90 capsule 3   zinc  gluconate 50 MG tablet Take 50 mg by mouth daily.     chlorthalidone  (HYGROTON ) 25 MG tablet TAKE 1 TABLET BY  MOUTH EVERY DAY 30 tablet 0   bacitracin  ointment Apply 1 Application topically 2 (two) times daily. 120 g 0   No facility-administered medications prior to visit.   Allergies  Allergen Reactions   Ace Inhibitors Rash   Ropinirole Hcl Rash   Objective:   Today's Vitals   11/23/23 0806  BP: 134/72  Pulse: 62  Temp: 97.6 F (36.4 C)  TempSrc: Temporal  SpO2: 100%  Weight: 207 lb 7 oz (94.1 kg)  Height: 5' 9 (1.753 m)   Body mass index is 30.63 kg/m.   General: Well developed, well nourished. No acute distress. Psych: Alert and oriented. Normal mood and affect.  Health Maintenance Due  Topic Date Due   Zoster Vaccines- Shingrix (1 of  2) Never done   OPHTHALMOLOGY EXAM  07/21/2023     Assessment & Plan:   Problem List Items Addressed This Visit       Cardiovascular and Mediastinum   Coronary artery disease   No current chest pain. Continue aspirin  81 mg daily, clopidogrel  75 mg daily, metoprolol  succinate 25 mg daily, and NTG PRN.      Relevant Medications   chlorthalidone  (HYGROTON ) 25 MG tablet   sildenafil  (VIAGRA ) 50 MG tablet   Essential hypertension - Primary   BP is in acceptable control. Continue chlorthalidone  25 mg daily and metoprolol  succinate 25 mg daily.      Relevant Medications   chlorthalidone  (HYGROTON ) 25 MG tablet   sildenafil  (VIAGRA ) 50 MG tablet     Endocrine   Type 2 diabetes mellitus with diabetic polyneuropathy, without long-term current use of insulin (HCC)   We will check an A1c today. I will continue metformin  1,000 mg daily and sitagliptin  50 mg daily. If his A1c is no longer at goal on the reduced dose of metformin , I will plan to add empagliflozin (in light of his CAD).      Relevant Medications   cyclobenzaprine  (FLEXERIL ) 10 MG tablet   Other Relevant Orders   Glucose, random   Hemoglobin A1c     Other   Erectile dysfunction   Continue sildenafil  as needed.      Relevant Medications   sildenafil  (VIAGRA ) 50 MG  tablet   Hyperlipidemia   Lipids are at goal. Continue atorvastatin  40 mg daily, fenofibrate  134 mg daily, and fish oil      Relevant Medications   chlorthalidone  (HYGROTON ) 25 MG tablet   sildenafil  (VIAGRA ) 50 MG tablet   Other Visit Diagnoses       Essential hypertension, benign       Relevant Medications   chlorthalidone  (HYGROTON ) 25 MG tablet   sildenafil  (VIAGRA ) 50 MG tablet     Chronic midline low back pain without sciatica       Relevant Medications   cyclobenzaprine  (FLEXERIL ) 10 MG tablet       Return in about 3 months (around 02/22/2024).   Garnette CHRISTELLA Simpler, MD

## 2023-11-23 NOTE — Assessment & Plan Note (Signed)
 We will check an A1c today. I will continue metformin  1,000 mg daily and sitagliptin  50 mg daily. If his A1c is no longer at goal on the reduced dose of metformin , I will plan to add empagliflozin (in light of his CAD).

## 2023-11-23 NOTE — Assessment & Plan Note (Signed)
 BP is in acceptable control. Continue chlorthalidone  25 mg daily and metoprolol  succinate 25 mg daily.

## 2023-12-03 ENCOUNTER — Other Ambulatory Visit: Payer: Self-pay | Admitting: Family Medicine

## 2023-12-03 ENCOUNTER — Telehealth: Payer: Self-pay | Admitting: Family Medicine

## 2023-12-03 DIAGNOSIS — R12 Heartburn: Secondary | ICD-10-CM

## 2023-12-03 DIAGNOSIS — E1142 Type 2 diabetes mellitus with diabetic polyneuropathy: Secondary | ICD-10-CM

## 2023-12-03 NOTE — Telephone Encounter (Signed)
 Copied from CRM #[id number]. Topic: Clinical - Medication Refill >> Dec 03, 2023  1:27 PM Dedra B wrote: Medication: metoprolol  succinate (TOPROL -XL) 25 MG 24 hr tablet. (Previously prescribed by his cardiologist, who is now retired)  Has the patient contacted their pharmacy? Yes, said they have been trying to contact office for refills. Pt believes that may have been reaching out to his cardiologist, who is now retired.    This is the patient's preferred pharmacy:   [id number] [place], [place] - [address] [place] [address] Phone: [phone] Fax: [phone]  Is this the correct pharmacy for this prescription? Yes  Has the prescription been filled recently? No  Is the patient out of the medication? Yes  Has the patient been seen for an appointment in the last year OR does the patient have an upcoming appointment? Yes  Can we respond through MyChart? Yes  Agent: Please be advised that Rx refills may take up to 3 business days. We ask that you follow-up with your pharmacy.  Pt said he is going out of town today and would like prescription today. Informed pt  that refills can take up to 3 business days.

## 2023-12-06 ENCOUNTER — Telehealth: Payer: Self-pay

## 2023-12-06 MED ORDER — METOPROLOL SUCCINATE ER 25 MG PO TB24: 12.5000 mg | ORAL_TABLET | Freq: Two times a day (BID) | ORAL | 2 refills | Status: DC

## 2023-12-06 NOTE — Telephone Encounter (Signed)
 [facility] Met[facility]  ER sent to pharmacy.   LOV  11/23/23 FOV 03/07/24 LR 11/24/22, #90 , 3 rf

## 2024-01-21 ENCOUNTER — Other Ambulatory Visit: Payer: Self-pay | Admitting: Family

## 2024-01-21 DIAGNOSIS — E1142 Type 2 diabetes mellitus with diabetic polyneuropathy: Secondary | ICD-10-CM

## 2024-01-25 ENCOUNTER — Other Ambulatory Visit: Payer: Self-pay | Admitting: Family Medicine

## 2024-02-01 ENCOUNTER — Telehealth: Payer: Self-pay

## 2024-02-01 NOTE — Progress Notes (Signed)
   02/01/2024  Patient ID: Chris [name], male   DOB: 1955/07/04, 68 y.o.   MRN: [id number]  This patient is appearing on a report for being at risk of failing the adherence measure for cholesterol (statin) medications this calendar year.   Medication: atorvastatin  40mg  daily  Last fill date: 01/25/2024 for 90 day supply  Insurance report was not up to date. No action needed at this time.   Channing DELENA Mealing, PharmD, DPLA

## 2024-02-14 ENCOUNTER — Ambulatory Visit
Admission: EM | Admit: 2024-02-14 | Discharge: 2024-02-14 | Disposition: A | Discharge status: Home / Self Care | Attending: Nurse Practitioner | Admitting: Nurse Practitioner

## 2024-02-14 DIAGNOSIS — R0981 Nasal congestion: Secondary | ICD-10-CM | POA: Diagnosis not present

## 2024-02-14 DIAGNOSIS — R051 Acute cough: Secondary | ICD-10-CM

## 2024-02-14 MED ORDER — PREDNISONE 10 MG PO TABS: 10.0000 mg | ORAL_TABLET | Freq: Two times a day (BID) | ORAL | 0 refills | Status: AC

## 2024-02-14 NOTE — Discharge Instructions (Signed)
 Your symptoms are likely related to an allergic response from working in the leaves. A short low dose prednisone  burst has been sent to the pharmacy on file. Continue your Flonase  and Xyzal  (allergy medication). May use Coricidin HBP cough/cold to help manage the symptoms if needed.

## 2024-02-14 NOTE — ED Triage Notes (Signed)
 Pt present with c/o sore throat, cough, nasal congestion, chest tightness when coughing. States the symptoms have been going on for 2 days. Pt states he is coughing up thick mucus.   Home interventions: Mucinex 

## 2024-02-14 NOTE — ED Provider Notes (Signed)
 [facility]    CSN: [id number] Arrival date & time: 02/14/24  1454      History   Chief Complaint Chief Complaint  Patient presents with   Cough   Nasal Congestion    HPI Chris [name] is a 68 y.o. male.   Patient presents requesting evaluation with new onset of nasal congestion and an intermittently productive cough.  Symptom onset was yesterday after he was outside working in the leaves.  No other associated symptoms such as fever, headache, chest pain, shortness of breath, abdominal pain, vomiting, or diarrhea.  He has taken some Mucinex  which was not helpful with symptom management.  The history is provided by the patient.  Cough Associated symptoms: no chest pain, no chills, no fever, no headaches, no myalgias, no rash, no rhinorrhea, no shortness of breath and no sore throat     Past Medical History:  Diagnosis Date   Acute MI, inferior wall, initial episode of care [facility]) 2009   Allergy    Anemia    Arthritis    Coronary atherosclerosis of unspecified type of vessel, native or graft    GERD (gastroesophageal reflux disease)    High cholesterol    Hypertension    Seasonal allergies    Sleep apnea    I've never worn a mask (09/17/2016)    Patient Active Problem List   Diagnosis Date Noted   Right leg swelling 06/28/2023   Essential hypertension 08/11/2022   Chronic pain of left ankle- s/p ORIF of ankle fracture with hardware 04/15/2022   Lower urinary tract symptoms (LUTS) 11/06/2021   Diabetic peripheral neuropathy (HCC) 06/28/2020   Diabetic retinopathy (HCC) 06/28/2020   Allergic rhinitis 06/28/2020   History of pulmonary embolus (PE) 06/28/2020   Type 2 diabetes mellitus with diabetic polyneuropathy, without long-term current use of insulin (HCC) 10/05/2018   Plantar fasciitis 10/05/2018   GERD (gastroesophageal reflux disease) 12/05/2016   Ventral hernia without obstruction or gangrene 11/30/2016   History of MI (myocardial infarction)  05/21/2014   Erectile dysfunction 07/25/2012   Restless leg syndrome 02/20/2010   Hyperlipidemia 02/19/2010   Obstructive sleep apnea 02/19/2010   Coronary artery disease 02/19/2010    Past Surgical History:  Procedure Laterality Date   APPENDECTOMY     CHOLECYSTECTOMY OPEN  ~ 2008   it exploded   CORONARY ANGIOPLASTY WITH STENT PLACEMENT  2009    2.75 x 28 mm Promus stent to the RCA   FRACTURE SURGERY     HERNIA REPAIR     INSERTION OF MESH N/A 11/30/2016   Procedure: INSERTION OF MESH;  Surgeon: Kimble Agent, MD;  Location: [facility];  Service: General;  Laterality: N/A;   LAPAROSCOPIC APPENDECTOMY N/A 09/16/2016   Procedure: APPENDECTOMY LAPAROSCOPIC;  Surgeon: Kimble Agent, MD;  Location: [facility];  Service: General;  Laterality: N/A;   LAPAROSCOPIC INCISIONAL / UMBILICAL / VENTRAL HERNIA REPAIR  11/30/2016   w/repair serosal tear   ORIF ANKLE FRACTURE Left 11/27/2015   Procedure: OPEN REDUCTION INTERNAL FIXATION (ORIF) ANKLE FRACTURE;  Surgeon: Dempsey Sensor, MD;  Location: [facility];  Service: Orthopedics;  Laterality: Left;   VENTRAL HERNIA REPAIR N/A 11/30/2016   Procedure: LAPAROSCOPIC VENTRAL HERNIA REPAIR AND  REPAIR OF SEROSAL TEAR;  Surgeon: Kimble Agent, MD;  Location: [facility];  Service: General;  Laterality: N/A;       Home Medications    Prior to Admission medications   Medication Sig Start Date End Date Taking? Authorizing Provider  predniSONE  (DELTASONE ) 10 MG tablet Take 1 tablet (10 mg total) by mouth 2 (two) times daily for 5 days. 02/14/24 02/19/24 Yes Janet Therisa PARAS, FNP  allopurinol (ZYLOPRIM) 300 MG tablet Take 300 mg by mouth daily. 05/04/18   [provider]  Apple Cider Vinegar-Ginger 500-5 MG CHEW Chew by mouth.    [provider]  aspirin  81 MG tablet Take 81 mg by mouth daily.    [provider]  atorvastatin  (LIPITOR) 40 MG tablet TAKE 1 TABLET BY MOUTH EVERY DAY 12/15/22   Nahser, Aleene PARAS, MD  chlorthalidone   (HYGROTON ) 25 MG tablet Take 1 tablet (25 mg total) by mouth daily. 11/23/23   Thedora Garnette HERO, MD  clopidogrel  (PLAVIX ) 75 MG tablet TAKE 1 TABLET BY MOUTH EVERY DAY 01/26/24   Thedora Garnette HERO, MD  cyclobenzaprine  (FLEXERIL ) 10 MG tablet Take 1 tablet (10 mg total) by mouth 3 (three) times daily as needed for muscle spasms. 11/23/23   Thedora Garnette HERO, MD  fenofibrate  micronized (LOFIBRA) 134 MG capsule TAKE 1 CAPSULE BY MOUTH EVERY DAY 11/19/23   Thedora Garnette HERO, MD  fluticasone  (FLONASE ) 50 MCG/ACT nasal spray Place 1 spray into both nostrils 2 (two) times daily. 08/06/23   Thedora Garnette HERO, MD  gabapentin  (NEURONTIN ) 300 MG capsule TAKE 1-2 CAPSULES (300-600 MG TOTAL) BY MOUTH AT BEDTIME. 12/03/23   Thedora Garnette HERO, MD  levocetirizine (XYZAL ) 5 MG tablet Take 5 mg by mouth at bedtime as needed for allergies. 12/16/21   [provider]  loperamide (IMODIUM) 2 MG capsule Take by mouth as needed for diarrhea or loose stools.    [provider]  metFORMIN  (GLUCOPHAGE ) 1000 MG tablet Take 1 tablet (1,000 mg total) by mouth 2 (two) times daily with a meal. Patient taking differently: Take 1,000 mg by mouth daily. 05/10/23   Thedora Garnette HERO, MD  metoprolol  succinate (TOPROL -XL) 25 MG 24 hr tablet Take 0.5 tablets (12.5 mg total) by mouth in the morning and at bedtime. 12/06/23   Thedora Garnette HERO, MD  multivitamin-iron -minerals-folic acid (CENTRUM) chewable tablet Chew 1 tablet by mouth daily.    [provider]  nitroGLYCERIN  (NITROSTAT ) 0.4 MG SL tablet Place 1 tablet (0.4 mg total) under the tongue every 5 (five) minutes as needed for chest pain. 03/17/22 11/23/23  Thedora Garnette HERO, MD  Omega-3 Fatty Acids (FISH OIL) 1200 MG CAPS Take 2 capsules by mouth 2 (two) times daily.    [provider]  pantoprazole  (PROTONIX ) 40 MG tablet TAKE 1 TABLET BY MOUTH EVERY DAY 12/03/23   Webb, Padonda B, FNP  sildenafil  (VIAGRA ) 50 MG tablet Take 1-2 tablets (50-100 mg total) by mouth daily as  needed for erectile dysfunction. 11/23/23   Thedora Garnette HERO, MD  sitaGLIPtin  (JANUVIA ) 50 MG tablet Take 1 tablet (50 mg total) by mouth daily. 04/30/23   Thedora Garnette HERO, MD  tamsulosin  (FLOMAX ) 0.4 MG CAPS capsule TAKE 1 CAPSULE BY MOUTH EVERY DAY 02/19/23   Webb, Padonda B, FNP  zinc  gluconate 50 MG tablet Take 50 mg by mouth daily.    [provider]    Family History Family History  Problem Relation Age of Onset   Heart disease Mother    Hypertension Father    Prostate cancer Brother    CAD Brother     Social History Social History   Tobacco Use   Smoking status: Former    Current packs/day: 0.00    Average packs/day: 2.5 packs/day for 36.0  years (90.0 ttl pk-yrs)    Types: Cigarettes    Start date: 08/22/1971    Quit date: 08/22/2007    Years since quitting: 16.4   Smokeless tobacco: Never  Vaping Use   Vaping status: Never Used  Substance Use Topics   Alcohol use: Not Currently    Comment: occ   Drug use: Not Currently     Allergies   Ace inhibitors and Ropinirole hcl   Review of Systems Review of Systems  Constitutional:  Negative for chills, fatigue and fever.  HENT:  Positive for congestion. Negative for rhinorrhea and sore throat.   Eyes:  Negative for pain and redness.  Respiratory:  Positive for cough. Negative for chest tightness and shortness of breath.   Cardiovascular:  Negative for chest pain.  Gastrointestinal:  Negative for abdominal pain, diarrhea and vomiting.  Genitourinary:  Negative for dysuria.  Musculoskeletal:  Negative for myalgias.  Skin:  Negative for rash.  Neurological:  Negative for dizziness and headaches.     Physical Exam Triage Vital Signs ED Triage Vitals [02/14/24 1717]  Encounter Vitals Group     BP (!) 157/88     Girls Systolic BP Percentile      Girls Diastolic BP Percentile      Boys Systolic BP Percentile      Boys Diastolic BP Percentile      Pulse Rate (!) 55     Resp 16     Temp 97.6 F (36.4 C)      Temp src      SpO2 98 %     Weight      Height      Head Circumference      Peak Flow      Pain Score 4     Pain Loc      Pain Education      Exclude from Growth Chart    No data found.  Updated Vital Signs BP (!) 157/88   Pulse (!) 55   Temp 97.6 F (36.4 C)   Resp 16   SpO2 98%   Visual Acuity Right Eye Distance:   Left Eye Distance:   Bilateral Distance:    Right Eye Near:   Left Eye Near:    Bilateral Near:     Physical Exam Vitals and nursing note reviewed.  Constitutional:      Appearance: Normal appearance.  HENT:     Head: Normocephalic.     Right Ear: Tympanic membrane and ear canal normal.     Left Ear: There is impacted cerumen.     Nose: Congestion present.     Mouth/Throat:     Mouth: Mucous membranes are moist.     Pharynx: No posterior oropharyngeal erythema.  Eyes:     Conjunctiva/sclera: Conjunctivae normal.     Pupils: Pupils are equal, round, and reactive to light.  Cardiovascular:     Rate and Rhythm: Normal rate.     Heart sounds: Normal heart sounds. No murmur heard.    Comments: Recheck HR 69 Pulmonary:     Effort: Pulmonary effort is normal.     Breath sounds: Normal breath sounds. No wheezing, rhonchi or rales.  Abdominal:     General: Bowel sounds are normal.  Skin:    General: Skin is warm and dry.  Neurological:     General: No focal deficit present.     Mental Status: He is alert and oriented to person, place, and time.  Psychiatric:  Mood and Affect: Mood normal.        Behavior: Behavior normal.        Thought Content: Thought content normal.        Judgment: Judgment normal.    UC Treatments / Results  Labs (all labs ordered are listed, but only abnormal results are displayed) Labs Reviewed - No data to display  EKG   Radiology No results found.  Procedures Procedures (including critical care time)  Medications Ordered in UC Medications - No data to display  Initial Impression / Assessment and  Plan / UC Course  I have reviewed the triage vital signs and the nursing notes.  Pertinent labs & imaging results that were available during my care of the patient were reviewed by me and considered in my medical decision making (see chart for details).    Patient presents requesting valuation for new onset of nasal congestion and a cough that began after he was outside working in the yard/with leaves.  Patient states that he has done this multiple times.  On physical exam, he does have mild nasal congestion but his vital signs are stable and his lung sounds are clear.  No other signs of illness.  Patient is a diabetic-does not routinely monitor his blood sugar.  Last hemoglobin A1c was 6.9 on 11/23/2023.  Will trial a low-dose prednisone  burst to see if that helps alleviate his symptoms.  Continue with Flonase  and Xyzal .  For additional symptom management, may use Coricidin HBP cough and cold.  Monitor symptoms closely and if symptoms worsen or fail to improve, requesting return evaluation.   Final Clinical Impressions(s) / UC Diagnoses   Final diagnoses:  Acute cough  Nasal congestion     Discharge Instructions      Your symptoms are likely related to an allergic response from working in the leaves. A short low dose prednisone  burst has been sent to the pharmacy on file. Continue your Flonase  and Xyzal  (allergy medication). May use Coricidin HBP cough/cold to help manage the symptoms if needed.     ED Prescriptions     Medication Sig Dispense Auth. Provider   predniSONE  (DELTASONE ) 10 MG tablet Take 1 tablet (10 mg total) by mouth 2 (two) times daily for 5 days. 10 tablet Janet Therisa PARAS, FNP      PDMP not reviewed this encounter.   Janet Therisa PARAS, FNP 02/14/24 1800

## 2024-02-15 ENCOUNTER — Ambulatory Visit: Admitting: Family Medicine

## 2024-02-15 NOTE — Progress Notes (Deleted)
 Chris [name] , 30-Jan-1956, 68 y.o., male MRN: [id number] Patient Care Team    Relationship Specialty Notifications Start End  Thedora Garnette HERO, MD PCP - General Family Medicine  06/28/20   Kimble Agent, MD Consulting Physician General Surgery  12/10/16   Liam Lerner, MD Consulting Physician Orthopedic Surgery  12/10/16   Valdemar Rogue, MD Consulting Physician Ophthalmology  06/28/20   Verta Royden DASEN, [place] Consulting Physician Podiatry  06/28/20   Nahser, Aleene PARAS, MD (Inactive) Consulting Physician Cardiology  08/01/20     No chief complaint on file.    Subjective: Chris [name] is a 68 y.o. Pt presents for an OV with complaints of cough and congestion of *** duration.  Associated symptoms include ***.  Pt has tried *** to ease their symptoms.      04/19/2023   10:21 AM 04/13/2022    9:32 AM 11/04/2021    1:50 PM 06/28/2020    3:07 PM 10/05/2018   10:01 AM  Depression screen PHQ 2/9  Decreased Interest 0 0 0 0 0  Down, Depressed, Hopeless 0 0 0 0 0  PHQ - 2 Score 0 0 0 0 0  Altered sleeping   1    Tired, decreased energy   0    Change in appetite   0    Feeling bad or failure about yourself    0    Trouble concentrating   0    Moving slowly or fidgety/restless   0    Suicidal thoughts   0    PHQ-9 Score   1     Difficult doing work/chores   Not difficult at all       Data saved with a previous flowsheet row definition    Allergies  Allergen Reactions   Ace Inhibitors Rash   Ropinirole Hcl Rash   Social History   Social History Narrative   Lives with wife. Exercises occasionally.   Past Medical History:  Diagnosis Date   Acute MI, inferior wall, initial episode of care [facility]) 2009   Allergy    Anemia    Arthritis    Coronary atherosclerosis of unspecified type of vessel, native or graft    GERD (gastroesophageal reflux disease)    High cholesterol    Hypertension    Seasonal allergies    Sleep apnea    I've never worn a mask (09/17/2016)   Past Surgical  History:  Procedure Laterality Date   APPENDECTOMY     CHOLECYSTECTOMY OPEN  ~ 2008   it exploded   CORONARY ANGIOPLASTY WITH STENT PLACEMENT  2009    2.75 x 28 mm Promus stent to the RCA   FRACTURE SURGERY     HERNIA REPAIR     INSERTION OF MESH N/A 11/30/2016   Procedure: INSERTION OF MESH;  Surgeon: Kimble Agent, MD;  Location: [facility] OR;  Service: General;  Laterality: N/A;   LAPAROSCOPIC APPENDECTOMY N/A 09/16/2016   Procedure: APPENDECTOMY LAPAROSCOPIC;  Surgeon: Kimble Agent, MD;  Location: [facility] OR;  Service: General;  Laterality: N/A;   LAPAROSCOPIC INCISIONAL / UMBILICAL / VENTRAL HERNIA REPAIR  11/30/2016   w/repair serosal tear   ORIF ANKLE FRACTURE Left 11/27/2015   Procedure: OPEN REDUCTION INTERNAL FIXATION (ORIF) ANKLE FRACTURE;  Surgeon: Lerner Liam, MD;  Location: [facility];  Service: Orthopedics;  Laterality: Left;   VENTRAL HERNIA REPAIR N/A 11/30/2016   Procedure: LAPAROSCOPIC VENTRAL HERNIA REPAIR AND  REPAIR OF SEROSAL TEAR;  Surgeon: Kimble Agent, MD;  Location: [facility] OR;  Service: General;  Laterality: N/A;   Family History  Problem Relation Age of Onset   Heart disease Mother    Hypertension Father    Prostate cancer Brother    CAD Brother    Allergies as of 02/15/2024       Reactions   Ace Inhibitors Rash   Ropinirole Hcl Rash        Medication List        Accurate as of February 15, 2024  7:36 AM. If you have any questions, ask your nurse or doctor.          allopurinol 300 MG tablet Commonly known as: ZYLOPRIM Take 300 mg by mouth daily.   Apple Cider Vinegar-Ginger 500-5 MG Chew Chew by mouth.   aspirin  81 MG tablet Take 81 mg by mouth daily.   atorvastatin  40 MG tablet Commonly known as: LIPITOR TAKE 1 TABLET BY MOUTH EVERY DAY   chlorthalidone  25 MG tablet Commonly known as: HYGROTON  Take 1 tablet (25 mg total) by mouth daily.   clopidogrel  75 MG tablet Commonly known as: PLAVIX  TAKE 1 TABLET BY MOUTH EVERY DAY    cyclobenzaprine  10 MG tablet Commonly known as: FLEXERIL  Take 1 tablet (10 mg total) by mouth 3 (three) times daily as needed for muscle spasms.   fenofibrate  micronized 134 MG capsule Commonly known as: LOFIBRA TAKE 1 CAPSULE BY MOUTH EVERY DAY   Fish Oil 1200 MG Caps Take 2 capsules by mouth 2 (two) times daily.   fluticasone  50 MCG/ACT nasal spray Commonly known as: FLONASE  Place 1 spray into both nostrils 2 (two) times daily.   gabapentin  300 MG capsule Commonly known as: NEURONTIN  TAKE 1-2 CAPSULES (300-600 MG TOTAL) BY MOUTH AT BEDTIME.   levocetirizine 5 MG tablet Commonly known as: XYZAL  Take 5 mg by mouth at bedtime as needed for allergies.   loperamide 2 MG capsule Commonly known as: IMODIUM Take by mouth as needed for diarrhea or loose stools.   metFORMIN  1000 MG tablet Commonly known as: GLUCOPHAGE  Take 1 tablet (1,000 mg total) by mouth 2 (two) times daily with a meal. What changed: when to take this   metoprolol  succinate 25 MG 24 hr tablet Commonly known as: TOPROL -XL Take 0.5 tablets (12.5 mg total) by mouth in the morning and at bedtime.   multivitamin-iron -minerals-folic acid chewable tablet Chew 1 tablet by mouth daily.   nitroGLYCERIN  0.4 MG SL tablet Commonly known as: NITROSTAT  Place 1 tablet (0.4 mg total) under the tongue every 5 (five) minutes as needed for chest pain.   pantoprazole  40 MG tablet Commonly known as: PROTONIX  TAKE 1 TABLET BY MOUTH EVERY DAY   predniSONE  10 MG tablet Commonly known as: DELTASONE  Take 1 tablet (10 mg total) by mouth 2 (two) times daily for 5 days.   sildenafil  50 MG tablet Commonly known as: Viagra  Take 1-2 tablets (50-100 mg total) by mouth daily as needed for erectile dysfunction.   sitaGLIPtin  50 MG tablet Commonly known as: JANUVIA  Take 1 tablet (50 mg total) by mouth daily.   tamsulosin  0.4 MG Caps capsule Commonly known as: FLOMAX  TAKE 1 CAPSULE BY MOUTH EVERY DAY   zinc  gluconate 50 MG  tablet Take 50 mg by mouth daily.        All past medical history, surgical history, allergies, family history, immunizations andmedications were updated in the EMR today and reviewed under the history and medication portions of their EMR.  ROS Negative, with the exception of above mentioned in HPI   Objective:  There were no vitals taken for this visit. There is no height or weight on file to calculate BMI.  Physical Exam   No results found. No results found. No results found for this or any previous visit (from the past 24 hours).  Assessment/Plan: Korde E Alarid is a 68 y.o. male present for OV for  *** Reviewed expectations re: course of current medical issues. Discussed self-management of symptoms. Outlined signs and symptoms indicating need for more acute intervention. Patient verbalized understanding and all questions were answered. Patient received an After-Visit Summary.    No orders of the defined types were placed in this encounter.  No orders of the defined types were placed in this encounter.  Referral Orders  No referral(s) requested today     Note is dictated utilizing voice recognition software. Although note has been proof read prior to signing, occasional typographical errors still can be missed. If any questions arise, please do not hesitate to call for verification.   electronically signed by:  Charlies Bellini, DO  [facility] - OR

## 2024-02-25 ENCOUNTER — Other Ambulatory Visit: Payer: Self-pay | Admitting: Family Medicine

## 2024-02-25 DIAGNOSIS — E1142 Type 2 diabetes mellitus with diabetic polyneuropathy: Secondary | ICD-10-CM

## 2024-02-25 MED ORDER — GABAPENTIN 300 MG PO CAPS: 300.0000 mg | ORAL_CAPSULE | Freq: Every day | ORAL | 3 refills | Status: AC

## 2024-02-28 ENCOUNTER — Other Ambulatory Visit: Payer: Self-pay | Admitting: Family Medicine

## 2024-02-28 DIAGNOSIS — R350 Frequency of micturition: Secondary | ICD-10-CM

## 2024-02-28 MED ORDER — TAMSULOSIN HCL 0.4 MG PO CAPS: 0.4000 mg | ORAL_CAPSULE | Freq: Every day | ORAL | 3 refills | Status: AC

## 2024-03-07 ENCOUNTER — Ambulatory Visit: Payer: Self-pay | Admitting: Family Medicine

## 2024-03-07 ENCOUNTER — Encounter: Payer: Self-pay | Admitting: Family Medicine

## 2024-03-07 ENCOUNTER — Ambulatory Visit (INDEPENDENT_AMBULATORY_CARE_PROVIDER_SITE_OTHER): Admitting: Family Medicine

## 2024-03-07 VITALS — BP 126/70 | HR 76 | Temp 97.3°F | Ht 69.0 in | Wt 213.6 lb

## 2024-03-07 DIAGNOSIS — E1142 Type 2 diabetes mellitus with diabetic polyneuropathy: Secondary | ICD-10-CM

## 2024-03-07 DIAGNOSIS — I10 Essential (primary) hypertension: Secondary | ICD-10-CM

## 2024-03-07 DIAGNOSIS — E782 Mixed hyperlipidemia: Secondary | ICD-10-CM

## 2024-03-07 DIAGNOSIS — Z7984 Long term (current) use of oral hypoglycemic drugs: Secondary | ICD-10-CM

## 2024-03-07 DIAGNOSIS — I25118 Atherosclerotic heart disease of native coronary artery with other forms of angina pectoris: Secondary | ICD-10-CM

## 2024-03-07 LAB — LIPID PANEL
Cholesterol: 132 mg/dL (ref 28–200)
HDL: 38.2 mg/dL — ABNORMAL LOW (ref >39.00)
LDL Cholesterol: 66 mg/dL (ref 0–99)
NonHDL: 93.64
Total CHOL/HDL Ratio: 3
Triglycerides: 138 mg/dL (ref 0.0–149.0)
VLDL: 27.6 mg/dL (ref 0.0–40.0)

## 2024-03-07 LAB — GLUCOSE, RANDOM: Glucose, Bld: 120 mg/dL — ABNORMAL HIGH (ref 70–99)

## 2024-03-07 LAB — HEMOGLOBIN A1C: Hgb A1c MFr Bld: 6.6 % — ABNORMAL HIGH (ref 4.6–6.5)

## 2024-03-07 MED ORDER — METFORMIN HCL 1000 MG PO TABS: 1000.0000 mg | ORAL_TABLET | Freq: Every day | ORAL | 3 refills | Status: AC

## 2024-03-07 MED ORDER — ATORVASTATIN CALCIUM 40 MG PO TABS: 60.0000 mg | ORAL_TABLET | Freq: Every day | ORAL | 3 refills | Status: AC

## 2024-03-07 NOTE — Assessment & Plan Note (Addendum)
 BP is in good control. Continue chlorthalidone  25 mg daily and metoprolol  succinate 25 mg daily.

## 2024-03-07 NOTE — Assessment & Plan Note (Signed)
 No current chest pain. Continue aspirin  81 mg daily, clopidogrel  75 mg daily, metoprolol  succinate 25 mg daily, and NTG PRN.

## 2024-03-07 NOTE — Progress Notes (Signed)
 [facility] [address] [place] [address] Dept: [phone] Dept Fax: [phone]  Chronic Care Office Visit  Subjective:    Patient ID: Chris [name], male    DOB: 1956-02-05, 68 y.o..   MRN: [id number]  Chief Complaint  Patient presents with   Hypertension    3 month f/u HTN.   No concerns.  Fasting today,    History of Present Illness:  Patient is in today for reassessment of chronic medical conditions.   Chris [name] has a history of Type 2 diabetes complicated with neuropathy and retinopathy. He is managed on metformin  1000 mg at bedtime and sitagliptin  (Januvia ) 50 mg daily. His neuropathy is managed with gabapentin  300 mg 1-2 at bedtime.    Chris [name] has a past history of a myocardial infarction and is s/p placement of 2 cardiac stents. He is on an aspirin  81 mg daily, clopidogrel  75 mg daily, metoprolol  succinate 25 mg daily, and has NTG available. He also has hyperlipidemia and is treated with atorvastatin  40 mg daily, fenofibrate  134 mg daily, and fish oil. He quit tobacco use more than 15 years ago.   Chris [name] has a history of hypertension. He is managed on chlorthalidone  25 mg daily and metoprolol  succinate 25 mg daily.   Past Medical History: Patient Active Problem List   Diagnosis Date Noted   Right leg swelling 06/28/2023   Essential hypertension 08/11/2022   Chronic pain of left ankle- s/p ORIF of ankle fracture with hardware 04/15/2022   Lower urinary tract symptoms (LUTS) 11/06/2021   Diabetic peripheral neuropathy (HCC) 06/28/2020   Diabetic retinopathy (HCC) 06/28/2020   Allergic rhinitis 06/28/2020   History of pulmonary embolus (PE) 06/28/2020   Type 2 diabetes mellitus with diabetic polyneuropathy, without long-term current use of insulin (HCC) 10/05/2018   Plantar fasciitis 10/05/2018   GERD (gastroesophageal reflux disease) 12/05/2016   Ventral hernia without obstruction or gangrene  11/30/2016   History of MI (myocardial infarction) 05/21/2014   Erectile dysfunction 07/25/2012   Restless leg syndrome 02/20/2010   Hyperlipidemia 02/19/2010   Obstructive sleep apnea 02/19/2010   Coronary artery disease 02/19/2010   Past Surgical History:  Procedure Laterality Date   APPENDECTOMY     CHOLECYSTECTOMY OPEN  ~ 2008   it exploded   CORONARY ANGIOPLASTY WITH STENT PLACEMENT  2009    2.75 x 28 mm Promus stent to the RCA   FRACTURE SURGERY     HERNIA REPAIR     INSERTION OF MESH N/A 11/30/2016   Procedure: INSERTION OF MESH;  Surgeon: Kimble Agent, MD;  Location: [facility] OR;  Service: General;  Laterality: N/A;   LAPAROSCOPIC APPENDECTOMY N/A 09/16/2016   Procedure: APPENDECTOMY LAPAROSCOPIC;  Surgeon: Kimble Agent, MD;  Location: [facility] OR;  Service: General;  Laterality: N/A;   LAPAROSCOPIC INCISIONAL / UMBILICAL / VENTRAL HERNIA REPAIR  11/30/2016   w/repair serosal tear   ORIF ANKLE FRACTURE Left 11/27/2015   Procedure: OPEN REDUCTION INTERNAL FIXATION (ORIF) ANKLE FRACTURE;  Surgeon: Dempsey Sensor, MD;  Location: [facility];  Service: Orthopedics;  Laterality: Left;   VENTRAL HERNIA REPAIR N/A 11/30/2016   Procedure: LAPAROSCOPIC VENTRAL HERNIA REPAIR AND  REPAIR OF SEROSAL TEAR;  Surgeon: Kimble Agent, MD;  Location: [facility] OR;  Service: General;  Laterality: N/A;   Family History  Problem Relation Age of Onset   Heart disease Mother    Hypertension Father    Prostate cancer Brother    CAD Brother  Outpatient Medications Prior to Visit  Medication Sig Dispense Refill   allopurinol (ZYLOPRIM) 300 MG tablet Take 300 mg by mouth daily.     Apple Cider Vinegar-Ginger 500-5 MG CHEW Chew by mouth.     aspirin  81 MG tablet Take 81 mg by mouth daily.     atorvastatin  (LIPITOR) 40 MG tablet TAKE 1 TABLET BY MOUTH EVERY DAY 90 tablet 3   chlorthalidone  (HYGROTON ) 25 MG tablet Take 1 tablet (25 mg total) by mouth daily. 90 tablet 3   clopidogrel  (PLAVIX ) 75 MG tablet  TAKE 1 TABLET BY MOUTH EVERY DAY 90 tablet 3   cyclobenzaprine  (FLEXERIL ) 10 MG tablet Take 1 tablet (10 mg total) by mouth 3 (three) times daily as needed for muscle spasms. 30 tablet 2   fenofibrate  micronized (LOFIBRA) 134 MG capsule TAKE 1 CAPSULE BY MOUTH EVERY DAY 90 capsule 3   fluticasone  (FLONASE ) 50 MCG/ACT nasal spray Place 1 spray into both nostrils 2 (two) times daily. 48 mL 2   gabapentin  (NEURONTIN ) 300 MG capsule Take 1-2 capsules (300-600 mg total) by mouth at bedtime. 180 capsule 3   levocetirizine (XYZAL ) 5 MG tablet Take 5 mg by mouth at bedtime as needed for allergies.     loperamide (IMODIUM) 2 MG capsule Take by mouth as needed for diarrhea or loose stools.     metFORMIN  (GLUCOPHAGE ) 1000 MG tablet Take 1 tablet (1,000 mg total) by mouth 2 (two) times daily with a meal. (Patient taking differently: Take 1,000 mg by mouth daily.) 180 tablet 3   metoprolol  succinate (TOPROL -XL) 25 MG 24 hr tablet Take 0.5 tablets (12.5 mg total) by mouth in the morning and at bedtime. 90 tablet 2   multivitamin-iron -minerals-folic acid (CENTRUM) chewable tablet Chew 1 tablet by mouth daily.     nitroGLYCERIN  (NITROSTAT ) 0.4 MG SL tablet Place 1 tablet (0.4 mg total) under the tongue every 5 (five) minutes as needed for chest pain. 100 tablet 1   Omega-3 Fatty Acids (FISH OIL) 1200 MG CAPS Take 2 capsules by mouth 2 (two) times daily.     pantoprazole  (PROTONIX ) 40 MG tablet TAKE 1 TABLET BY MOUTH EVERY DAY 90 tablet 3   sildenafil  (VIAGRA ) 50 MG tablet Take 1-2 tablets (50-100 mg total) by mouth daily as needed for erectile dysfunction. 10 tablet 11   sitaGLIPtin  (JANUVIA ) 50 MG tablet Take 1 tablet (50 mg total) by mouth daily. 90 tablet 3   tamsulosin  (FLOMAX ) 0.4 MG CAPS capsule Take 1 capsule (0.4 mg total) by mouth daily. 90 capsule 3   zinc  gluconate 50 MG tablet Take 50 mg by mouth daily.     No facility-administered medications prior to visit.   Allergies[1]   Objective:   Today's  Vitals   03/07/24 0820  BP: 126/70  Pulse: 76  Temp: (!) 97.3 F (36.3 C)  TempSrc: Temporal  Weight: 213 lb 9.6 oz (96.9 kg)  Height: 5' 9 (1.753 m)   Body mass index is 31.54 kg/m.   General: Well developed, well nourished. No acute distress. Psych: Alert and oriented. Normal mood and affect.  Health Maintenance Due  Topic Date Due   Zoster Vaccines- Shingrix (1 of 2) Never done   OPHTHALMOLOGY EXAM  07/21/2023   Influenza Vaccine  10/22/2023   COVID-19 Vaccine (1 - 2025-26 season) Never done   Medicare Annual Wellness (AWV)  04/18/2024   Lab Results Lab Results  Component Value Date   HGBA1C 6.9 (H) 11/23/2023   HGBA1C 6.6 (H)  08/06/2023   HGBA1C 7.3 (H) 04/30/2023    Assessment & Plan:   Problem List Items Addressed This Visit       Cardiovascular and Mediastinum   Coronary artery disease   No current chest pain. Continue aspirin  81 mg daily, clopidogrel  75 mg daily, metoprolol  succinate 25 mg daily, and NTG PRN.      Essential hypertension - Primary   BP is in good control. Continue chlorthalidone  25 mg daily and metoprolol  succinate 25 mg daily.        Endocrine   Type 2 diabetes mellitus with diabetic polyneuropathy, without long-term current use of insulin (HCC)   I will continue metformin  1,000 mg daily and sitagliptin  50 mg daily. I will recheck his A1c today.      Relevant Orders   Glucose, random   Hemoglobin A1c     Other   Hyperlipidemia   I will reassess lipids today. Continue atorvastatin  40 mg daily, fenofibrate  134 mg daily, and fish oil      Relevant Orders   Lipid panel   Other Visit Diagnoses       Long term current use of oral hypoglycemic drug           Return in about 3 months (around 06/05/2024) for Reassessment.   Garnette CHRISTELLA Simpler, MD  I,Emily Lagle,acting as a scribe for Garnette CHRISTELLA Simpler, MD.,have documented all relevant documentation on the behalf of Garnette CHRISTELLA Simpler, MD.  I, Garnette CHRISTELLA Simpler, MD, have reviewed all  documentation for this visit. The documentation on 03/07/2024 for the exam, diagnosis, procedures, and orders are all accurate and complete.     [1]  Allergies Allergen Reactions   Ace Inhibitors Rash   Ropinirole Hcl Rash

## 2024-03-07 NOTE — Assessment & Plan Note (Addendum)
 I will continue metformin  1,000 mg daily and sitagliptin  50 mg daily. I will recheck his A1c today.

## 2024-03-07 NOTE — Assessment & Plan Note (Addendum)
 I will reassess lipids today. Continue atorvastatin  40 mg daily, fenofibrate  134 mg daily, and fish oil

## 2024-04-06 NOTE — Progress Notes (Unsigned)
"   °  °  Cardiology Office Note Date:  04/07/2024  ID:  Chris [name], Chris [name] 12-04-1955, MRN [id number] PCP:  Thedora Garnette HERO, MD  Cardiologist:  Joelle VEAR [name], MD  Chief Complaint  Patient presents with   Coronary Artery Disease     Problems CAD RCA PCI 2009 TTE 9/18: 60-65% DM M: ASA81, AN40, CL25, CL75, FE134, XL12.5  Visits  1/26: APP Follow up, LDL 66 12/25, HA1C 6.6 12/25, d/c ASA, EN10, GLP1?    Discussed the use of AI scribe software for clinical note transcription with the patient, who gave verbal consent to proceed.  History of Present Illness Chris [name] is a 69 year old male with coronary artery disease and diabetes who presents for a cardiovascular follow-up. He has resumed physical activity, including daily walking and playing basketball, since his wife retired at Christmas. He plays basketball for over an hour and walks two miles afterward. He is focused on losing weight gained over the holidays, noting an increase from 202 pounds to 216 pounds. He experiences occasional shortness of breath, particularly when excited or during physical exertion, such as walking up inclines. He describes panting when climbing hills but attributes it to being unaccustomed to the activity. He used to walk four miles regularly and is now working to regain that level of fitness. He monitors his blood pressure at home using a digital arm cuff, reporting readings mostly below 120/80 mmHg. Occasionally, he notes higher readings, which he attributes to excitement. He reports having had a stent placed about ten years ago. He is currently on metoprolol , aspirin , and Plavix . He is aware of his diabetes diagnosis and is on Januvia  for management. He is uncertain about being on Jardiance but confirms having Januvia  at home.  ROS: Please see the history of present illness. All other systems are reviewed and negative.    PHYSICAL EXAM: VS:  BP 130/68 (Cuff Size: Normal)   Pulse 61   Ht 5' 9  (1.753 m)   Wt 216 lb (98 kg)   SpO2 96%   BMI 31.90 kg/m  , BMI Body mass index is 31.9 kg/m. GEN: Well nourished, well developed, in no acute distress HEENT: normal Neck: no JVD, carotid bruits, or masses Cardiac: RRR; no murmurs, rubs, or gallops,no edema  Respiratory:  CTAB bilaterally, normal work of breathing GI: soft, nontender, nondistended, + BS Extremities: No LE edema Skin: warm and dry, no rash Neuro:  Strength and sensation are intact  EKG: NSR w/ incomplete RBBB  Recent Labs: Reviewed  Studies: Reviewed Assessment & Plan  Coronary artery disease Managed with dual antiplatelet therapy. Aspirin  poses higher bleeding risk than Plavix . - Discontinued metoprolol  due to lack of benefit. - Discontinued aspirin , continued Plavix  for antiplatelet therapy.  Essential hypertension Blood pressure well-controlled with readings below 120 mmHg.  Type 2 diabetes mellitus Elevated A1c indicates suboptimal control. Januvia  does not reduce cardiovascular risk. Jardiance recommended for cardiovascular benefits. Ozempic considered for weight loss and diabetes management. - Referred to pharmacist to assess affordability and initiate Ozempic and Jardiance for diabetes management and weight loss. - Continue Januvia  for diabetes management.   Signed, Joelle VEAR [name], MD  04/07/2024 8:30 AM    [facility] "

## 2024-04-07 ENCOUNTER — Ambulatory Visit

## 2024-04-07 VITALS — BP 130/68 | HR 61 | Ht 69.0 in | Wt 216.0 lb

## 2024-04-07 DIAGNOSIS — I2510 Atherosclerotic heart disease of native coronary artery without angina pectoris: Secondary | ICD-10-CM

## 2024-04-07 DIAGNOSIS — E119 Type 2 diabetes mellitus without complications: Secondary | ICD-10-CM | POA: Diagnosis not present

## 2024-04-07 DIAGNOSIS — I10 Essential (primary) hypertension: Secondary | ICD-10-CM | POA: Diagnosis not present

## 2024-04-07 DIAGNOSIS — E782 Mixed hyperlipidemia: Secondary | ICD-10-CM | POA: Diagnosis not present

## 2024-04-07 NOTE — Patient Instructions (Addendum)
 Medication Instructions:  Stop Asprin as directed Stop Fenofibrate  134 mg as directed Stop Metoprolol  Succinate 25 mg as directed  *If you need a refill on your cardiac medications before your next appointment, please call your pharmacy*  Lab Work: NONE ordered at this time of appointment   Testing/Procedures: NONE ordered at this time of appointment   Follow-Up: At [facility], you and your health needs are our priority.  As part of our continuing mission to provide you with exceptional heart care, our providers are all part of one team.  This team includes your primary Cardiologist (physician) and Advanced Practice Providers or APPs (Physician Assistants and Nurse Practitioners) who all work together to provide you with the care you need, when you need it.  Your next appointment:   1 year ov Dr. Azobou Pharm -D appointment needed (next available)   We recommend signing up for the patient portal called MyChart.  Sign up information is provided on this After Visit Summary.  MyChart is used to connect with patients for Virtual Visits (Telemedicine).  Patients are able to view lab/test results, encounter notes, upcoming appointments, etc.  Non-urgent messages can be sent to your provider as well.   To learn more about what you can do with MyChart, go to [email or web address].

## 2024-04-10 ENCOUNTER — Ambulatory Visit: Admitting: Emergency Medicine

## 2024-04-21 ENCOUNTER — Ambulatory Visit: Payer: Self-pay

## 2024-04-24 ENCOUNTER — Ambulatory Visit: Payer: Self-pay

## 2024-04-24 ENCOUNTER — Telehealth: Payer: Self-pay | Admitting: Nurse Practitioner

## 2024-04-24 NOTE — Telephone Encounter (Signed)
 Called and talked with Mr. faulcon. he had some concerns over his recent visit with a new cardiologist. He is concerns were regarding the advice about stopping and starting certain meds. I reviewed the cardiology recommendations. He will go ahead and stop his daily aspirin , but continue Plavix . He will wait to hear from the pharmacist regarding the switch from Januvia  to Ozempic and Jardiance.  Garnette EMERSON Simpler, MD

## 2024-04-24 NOTE — Telephone Encounter (Signed)
 Copied from CRM #[id number]. Topic: General - Other >> Apr 24, 2024  9:17 AM Deleta RAMAN wrote: Reason for CRM: Patient is requesting call from pcp regarding personal questions. [phone]

## 2024-05-01 ENCOUNTER — Ambulatory Visit

## 2024-05-08 ENCOUNTER — Ambulatory Visit

## 2024-06-06 ENCOUNTER — Ambulatory Visit: Admitting: Family Medicine
